# Patient Record
Sex: Female | Born: 1940
Health system: Southern US, Community
[De-identification: ages and names within clinical notes are randomized; demographics above are authoritative.]

## PROBLEM LIST (undated history)

## (undated) DIAGNOSIS — M8580 Other specified disorders of bone density and structure, unspecified site: Secondary | ICD-10-CM

## (undated) DIAGNOSIS — D126 Benign neoplasm of colon, unspecified: Secondary | ICD-10-CM

## (undated) DIAGNOSIS — I219 Acute myocardial infarction, unspecified: Secondary | ICD-10-CM

## (undated) DIAGNOSIS — Z8679 Personal history of other diseases of the circulatory system: Secondary | ICD-10-CM

## (undated) DIAGNOSIS — I10 Essential (primary) hypertension: Secondary | ICD-10-CM

## (undated) DIAGNOSIS — E785 Hyperlipidemia, unspecified: Secondary | ICD-10-CM

## (undated) DIAGNOSIS — Z9889 Other specified postprocedural states: Secondary | ICD-10-CM

## (undated) DIAGNOSIS — I480 Paroxysmal atrial fibrillation: Secondary | ICD-10-CM

## (undated) DIAGNOSIS — K5900 Constipation, unspecified: Secondary | ICD-10-CM

## (undated) DIAGNOSIS — M1990 Unspecified osteoarthritis, unspecified site: Secondary | ICD-10-CM

## (undated) DIAGNOSIS — I509 Heart failure, unspecified: Secondary | ICD-10-CM

## (undated) DIAGNOSIS — L89159 Pressure ulcer of sacral region, unspecified stage: Secondary | ICD-10-CM

## (undated) DIAGNOSIS — I2510 Atherosclerotic heart disease of native coronary artery without angina pectoris: Secondary | ICD-10-CM

## (undated) DIAGNOSIS — Z951 Presence of aortocoronary bypass graft: Secondary | ICD-10-CM

## (undated) HISTORY — PX: CATARACT EXTRACTION, BILATERAL: SHX1313

## (undated) HISTORY — DX: Other specified disorders of bone density and structure, unspecified site: M85.80

## (undated) HISTORY — DX: Hyperlipidemia, unspecified: E78.5

## (undated) HISTORY — DX: Essential (primary) hypertension: I10

## (undated) HISTORY — PX: DILATION AND CURETTAGE OF UTERUS: SHX78

## (undated) HISTORY — DX: Benign neoplasm of colon, unspecified: D12.6

## (undated) HISTORY — DX: Acute myocardial infarction, unspecified: I21.9

## (undated) HISTORY — PX: BREAST CYST EXCISION: SHX579

## (undated) HISTORY — DX: Constipation, unspecified: K59.00

---
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
## 1988-06-25 HISTORY — PX: TOTAL ABDOMINAL HYSTERECTOMY W/ BILATERAL SALPINGOOPHORECTOMY: SHX83

## 2008-02-24 DIAGNOSIS — M8580 Other specified disorders of bone density and structure, unspecified site: Secondary | ICD-10-CM

## 2008-02-24 HISTORY — DX: Other specified disorders of bone density and structure, unspecified site: M85.80

## 2008-02-24 LAB — HM DEXA SCAN

## 2008-03-25 DIAGNOSIS — D126 Benign neoplasm of colon, unspecified: Secondary | ICD-10-CM

## 2008-03-25 HISTORY — DX: Benign neoplasm of colon, unspecified: D12.6

## 2008-03-29 LAB — HM COLONOSCOPY

## 2011-03-19 LAB — HM MAMMOGRAPHY

## 2011-09-24 ENCOUNTER — Encounter: Payer: Self-pay | Admitting: *Deleted

## 2011-09-26 ENCOUNTER — Ambulatory Visit (INDEPENDENT_AMBULATORY_CARE_PROVIDER_SITE_OTHER): Payer: Medicare Other | Admitting: Family Medicine

## 2011-09-26 ENCOUNTER — Encounter: Payer: Self-pay | Admitting: Family Medicine

## 2011-09-26 VITALS — BP 126/70 | HR 72 | Ht 65.0 in | Wt 139.0 lb

## 2011-09-26 DIAGNOSIS — Z0000 Encounter for general adult medical examination without abnormal findings: Secondary | ICD-10-CM

## 2011-09-26 DIAGNOSIS — I10 Essential (primary) hypertension: Secondary | ICD-10-CM

## 2011-09-26 DIAGNOSIS — E782 Mixed hyperlipidemia: Secondary | ICD-10-CM

## 2011-09-26 LAB — POCT URINALYSIS DIPSTICK
Bilirubin, UA: NEGATIVE
Blood, UA: NEGATIVE
Glucose, UA: NEGATIVE
Ketones, UA: NEGATIVE
Leukocytes, UA: NEGATIVE
Nitrite, UA: NEGATIVE
Protein, UA: NEGATIVE
Spec Grav, UA: 1.01
Urobilinogen, UA: NEGATIVE
pH, UA: 6

## 2011-09-26 NOTE — Progress Notes (Signed)
Claudia Howell is a 71 y.o. female who presents for a complete physical.  She has the following concerns:  Hypertension--she is monitored yearly by Dr. Garnette Scheuermann. He doesn't do bloodwork, but writes her prescriptions.  Blood pressures have been good.  Denies dizziness, headaches, chest pain or cough.  Hyperlipidemia:  Previously took red yeast rice, but then was changed to pravastatin.  She developed leg cramps with pravastatin, so stopped taking it.  Review of old records show some elevated TG, and last LDL, when off meds was 152.  Following low cholesterol, lowfat diet, although she admits that her weakness is cheese.  Hasn't been doing the fish oil, although still has them at home. Zetia was too expensive. Also recalls having not tolerated Simvastatin.    Immunization History  Administered Date(s) Administered  . Pneumococcal Polysaccharide 06/30/2009  . Td 06/25/2002  . Tdap 01/23/2009  . Zoster 01/24/2008   Last Pap smear: n/a Last mammogram: 02/2011 Last colonoscopy: 10/09 Last DEXA: 9/09 Dentist: twice yearly Ophtho: every 2 years Exercise: gym twice weekly, and home exercises daily for 10 minutes.  Past Medical History  Diagnosis Date  . Hypertension     Dr.Henry Katrinka Blazing  . Hyperlipidemia   . Osteopenia 9/09  . Adenomatous colon polyp 10/09    Past Surgical History  Procedure Date  . Total abdominal hysterectomy w/ bilateral salpingoophorectomy 1990    benign growth  . Breast surgery 1990    x 3 left breast for cyst    History   Social History  . Marital Status: Widowed    Spouse Name: N/A    Number of Children: 2  . Years of Education: N/A   Occupational History  . retired    Social History Main Topics  . Smoking status: Never Smoker   . Smokeless tobacco: Never Used  . Alcohol Use: Yes     1 glass twice a month.  . Drug Use: No  . Sexually Active: Not Currently   Other Topics Concern  . Not on file   Social History Narrative   Lives alone.  Retired  Public house manager.  1 son in [place], 1 son in [place].   Family History  Problem Relation Age of Onset  . Hypertension Mother   . Heart attack Mother   . Hypertension Brother   . Cancer Brother     prostate  . Diabetes Neg Hx    Current outpatient prescriptions:Acetylcarnitine HCl (ACETYL L-CARNITINE) 250 MG CAPS, Take 1 capsule by mouth daily., Disp: , Rfl: ;  amLODipine (NORVASC) 5 MG tablet, Take 5 mg by mouth daily., Disp: , Rfl: ;  atenolol (TENORMIN) 100 MG tablet, Take 100 mg by mouth daily., Disp: , Rfl: ;  benazepril (LOTENSIN) 40 MG tablet, Take 40 mg by mouth daily., Disp: , Rfl: ;  Black Cohosh 40 MG CAPS, Take 1 capsule by mouth daily., Disp: , Rfl:  CALCIUM-MAGNESIUM-VITAMIN D PO, Take 1 tablet by mouth daily., Disp: , Rfl: ;  cholecalciferol (VITAMIN D) 1000 UNITS tablet, Take 1,000 Units by mouth daily., Disp: , Rfl: ;  Coenzyme Q10 (CO Q 10) 100 MG CAPS, Take 1 capsule by mouth daily., Disp: , Rfl: ;  [employer] BETA CAROTENE PO, Take 1 tablet by mouth daily., Disp: , Rfl: ;  Glucosamine-Chondroitin-MSM 500-400-300 MG TABS, Take 2 tablets by mouth daily., Disp: , Rfl:  hydrochlorothiazide (HYDRODIURIL) 25 MG tablet, Take 25 mg by mouth daily., Disp: , Rfl: ;  Multiple Vitamins-Minerals (MULTIVITAMIN WITH MINERALS) tablet, Take 1  tablet by mouth daily., Disp: , Rfl: ;  multivitamin-lutein (OCUVITE-LUTEIN) CAPS, Take 1 capsule by mouth daily., Disp: , Rfl: ;  Nutritional Supplements (ESTROVEN MAXIMUM STRENGTH) TABS, Take 1 tablet by mouth daily., Disp: , Rfl:  Zinc 25 MG TABS, Take 1 tablet by mouth daily., Disp: , Rfl:   No Known Allergies  ROS:  The patient denies anorexia, fever, weight changes, headaches,  vision changes, decreased hearing, ear pain, sore throat, breast concerns, chest pain, palpitations, dizziness, syncope, dyspnea on exertion, cough, swelling, nausea, vomiting, diarrhea, constipation, abdominal pain, melena, hematochezia, indigestion/heartburn, hematuria, incontinence, dysuria, vaginal  bleeding, discharge, odor or itch, genital lesions, joint pains, numbness, tingling, weakness, tremor, suspicious skin lesions, depression, anxiety, abnormal bleeding/bruising, or enlarged lymph nodes.  PHYSICAL EXAM: BP 126/70  Pulse 72  Ht 5\' 5"  (1.651 m)  Wt 139 lb (63.05 kg)  BMI 23.13 kg/m2  General Appearance:    Alert, cooperative, no distress, appears stated age  Head:    Normocephalic, without obvious abnormality, atraumatic  Eyes:    PERRL, conjunctiva/corneas clear, EOM's intact, fundi    benign  Ears:    Normal TM's and external ear canals  Nose:   Nares normal, mucosa normal, no drainage or sinus   tenderness  Throat:   Lips, mucosa, and tongue normal; teeth and gums normal  Neck:   Supple, no lymphadenopathy;  thyroid:  no   enlargement/tenderness/nodules; no carotid   bruit or JVD  Back:    Spine nontender, no curvature, ROM normal, no CVA     tenderness  Lungs:     Clear to auscultation bilaterally without wheezes, rales or     ronchi; respirations unlabored  Chest Wall:    No tenderness or deformity   Heart:    Regular rate and rhythm, S1 and S2 normal, no murmur, rub   or gallop  Breast Exam:    No tenderness, masses, or nipple discharge or inversion.      No axillary lymphadenopathy  Abdomen:     Soft, non-tender, nondistended, normoactive bowel sounds,    no masses, no hepatosplenomegaly  Genitalia:    Normal external genitalia without lesions.  BUS and vagina normal; normal bimanual exam without masses.  Rectal:    Normal tone, no masses or tenderness; guaiac negative stool  Extremities:   No clubbing, cyanosis or edema  Pulses:   2+ and symmetric all extremities  Skin:   Skin color, texture, turgor normal, no rashes or lesions  Lymph nodes:   Cervical, supraclavicular, and axillary nodes normal  Neurologic:   CNII-XII intact, normal strength, sensation and gait; reflexes 2+ and symmetric throughout          Psych:   Normal mood, affect, hygiene and  grooming.    ASSESSMENT/PLAN: 1. Routine general medical examination at a health care facility  POCT Urinalysis Dipstick, Visual acuity screening, TSH  2. Essential hypertension, benign  Comprehensive metabolic panel  3. Mixed hyperlipidemia  Lipid panel, Comprehensive metabolic panel, TSH   Hyperlipidemia--if LDL only borderline high, can consider restarting red yeast rice and fish oil. If very high, then try lipitor.  HTN--well controlled, due for labs.  Send copies of labs to Dr. Katrinka Blazing.  Discussed monthly self breast exams and yearly mammograms after the age of 73; at least 30 minutes of aerobic activity at least 5 days/week; proper sunscreen use reviewed; healthy diet, including goals of calcium and vitamin D intake and alcohol recommendations (less than or equal to 1 drink/day) reviewed; regular  seatbelt use; changing batteries in smoke detectors.  Immunization recommendations discussed--UTD, continue yearly flu shots.  Colonoscopy recommendations reviewed--due again in 2014  DEXA Rx given to patient to schedule at [facility]

## 2011-09-26 NOTE — Patient Instructions (Addendum)
HEALTH MAINTENANCE RECOMMENDATIONS:  It is recommended that you get at least 30 minutes of aerobic exercise at least 5 days/week (for weight loss, you may need as much as 60-90 minutes). This can be any activity that gets your heart rate up. This can be divided in 10-15 minute intervals if needed, but try and build up your endurance at least once a week.  Weight bearing exercise is also recommended twice weekly.  Eat a healthy diet with lots of vegetables, fruits and fiber.  "Colorful" foods have a lot of vitamins (ie green vegetables, tomatoes, red peppers, etc).  Limit sweet tea, regular sodas and alcoholic beverages, all of which has a lot of calories and sugar.  Up to 1 alcoholic drink daily may be beneficial for women (unless trying to lose weight, watch sugars).  Drink a lot of water.  Calcium recommendations are 1200-1500 mg daily (1500 mg for postmenopausal women or women without ovaries), and vitamin D 1000 IU daily.  This should be obtained from diet and/or supplements (vitamins), and calcium should not be taken all at once, but in divided doses.  Monthly self breast exams and yearly mammograms for women over the age of 68 is recommended.  Sunscreen of at least SPF 30 should be used on all sun-exposed parts of the skin when outside between the hours of 10 am and 4 pm (not just when at beach or pool, but even with exercise, golf, tennis, and yard work!)  Use a sunscreen that says "broad spectrum" so it covers both UVA and UVB rays, and make sure to reapply every 1-2 hours.  Remember to change the batteries in your smoke detectors when changing your clock times in the spring and fall.  Use your seat belt every time you are in a car, and please drive safely and not be distracted with cell phones and texting while driving.  Cholesterol Control Diet Cholesterol levels in your body are determined significantly by your diet. Cholesterol levels may also be related to heart disease. The following  material helps to explain this relationship and discusses what you can do to help keep your heart healthy. Not all cholesterol is bad. Low-density lipoprotein (LDL) cholesterol is the "bad" cholesterol. It may cause fatty deposits to build up inside your arteries. High-density lipoprotein (HDL) cholesterol is "good." It helps to remove the "bad" LDL cholesterol from your blood. Cholesterol is a very important risk factor for heart disease. Other risk factors are high blood pressure, smoking, stress, heredity, and weight. The heart muscle gets its supply of blood through the coronary arteries. If your LDL cholesterol is high and your HDL cholesterol is low, you are at risk for having fatty deposits build up in your coronary arteries. This leaves less room through which blood can flow. Without sufficient blood and oxygen, the heart muscle cannot function properly and you may feel chest pains (angina pectoris). When a coronary artery closes up entirely, a part of the heart muscle may die, causing a heart attack (myocardial infarction). CHECKING CHOLESTEROL When your caregiver sends your blood to a lab to be analyzed for cholesterol, a complete lipid (fat) profile may be done. With this test, the total amount of cholesterol and levels of LDL and HDL are determined. Triglycerides are a type of fat that circulates in the blood and can also be used to determine heart disease risk. The list below describes what the numbers should be: Test: Total Cholesterol.  Less than 200 mg/dl.  Test: LDL "bad cholesterol."  Less than 100 mg/dl.   Less than 70 mg/dl if you are at very high risk of a heart attack or sudden cardiac death.  Test: HDL "good cholesterol."  Greater than 50 mg/dl for women.   Greater than 40 mg/dl for men.  Test: Triglycerides.  Less than 150 mg/dl.  CONTROLLING CHOLESTEROL WITH DIET Although exercise and lifestyle factors are important, your diet is key. That is because certain foods are  known to raise cholesterol and others to lower it. The goal is to balance foods for their effect on cholesterol and more importantly, to replace saturated and trans fat with other types of fat, such as monounsaturated fat, polyunsaturated fat, and omega-3 fatty acids. On average, a person should consume no more than 15 to 17 g of saturated fat daily. Saturated and trans fats are considered "bad" fats, and they will raise LDL cholesterol. Saturated fats are primarily found in animal products such as meats, butter, and cream. However, that does not mean you need to sacrifice all your favorite foods. Today, there are good tasting, low-fat, low-cholesterol substitutes for most of the things you like to eat. Choose low-fat or nonfat alternatives. Choose round or loin cuts of red meat, since these types of cuts are lowest in fat and cholesterol. Chicken (without the skin), fish, veal, and ground [place] breast are excellent choices. Eliminate fatty meats, such as hot dogs and salami. Even shellfish have little or no saturated fat. Have a 3 oz (85 g) portion when you eat lean meat, poultry, or fish. Trans fats are also called "partially hydrogenated oils." They are oils that have been scientifically manipulated so that they are solid at room temperature resulting in a longer shelf life and improved taste and texture of foods in which they are added. Trans fats are found in stick margarine, some tub margarines, cookies, crackers, and baked goods.  When baking and cooking, oils are an excellent substitute for butter. The monounsaturated oils are especially beneficial since it is believed they lower LDL and raise HDL. The oils you should avoid entirely are saturated tropical oils, such as coconut and palm.  Remember to eat liberally from food groups that are naturally free of saturated and trans fat, including fish, fruit, vegetables, beans, grains (barley, rice, couscous, bulgur wheat), and pasta (without cream sauces).    IDENTIFYING FOODS THAT LOWER CHOLESTEROL  Soluble fiber may lower your cholesterol. This type of fiber is found in fruits such as apples, vegetables such as broccoli, potatoes, and carrots, legumes such as beans, peas, and lentils, and grains such as barley. Foods fortified with plant sterols (phytosterol) may also lower cholesterol. You should eat at least 2 g per day of these foods for a cholesterol lowering effect.  Read package labels to identify low-saturated fats, trans fats free, and low-fat foods at the supermarket. Select cheeses that have only 2 to 3 g saturated fat per ounce. Use a heart-healthy tub margarine that is free of trans fats or partially hydrogenated oil. When buying baked goods (cookies, crackers), avoid partially hydrogenated oils. Breads and muffins should be made from whole grains (whole-wheat or whole oat flour, instead of "flour" or "enriched flour"). Buy non-creamy canned soups with reduced salt and no added fats.  FOOD PREPARATION TECHNIQUES  Never deep-fry. If you must fry, either stir-fry, which uses very little fat, or use non-stick cooking sprays. When possible, broil, bake, or roast meats, and steam vegetables. Instead of dressing vegetables with butter or margarine, use lemon and herbs,  applesauce and cinnamon (for squash and sweet potatoes), nonfat yogurt, salsa, and low-fat dressings for salads.  LOW-SATURATED FAT / LOW-FAT FOOD SUBSTITUTES Meats / Saturated Fat (g)  Avoid: Steak, marbled (3 oz/85 g) / 11 g   Choose: Steak, lean (3 oz/85 g) / 4 g   Avoid: Hamburger (3 oz/85 g) / 7 g   Choose: Hamburger, lean (3 oz/85 g) / 5 g   Avoid: Ham (3 oz/85 g) / 6 g   Choose: Ham, lean cut (3 oz/85 g) / 2.4 g   Avoid: Chicken, with skin, dark meat (3 oz/85 g) / 4 g   Choose: Chicken, skin removed, dark meat (3 oz/85 g) / 2 g   Avoid: Chicken, with skin, light meat (3 oz/85 g) / 2.5 g   Choose: Chicken, skin removed, light meat (3 oz/85 g) / 1 g  Dairy /  Saturated Fat (g)  Avoid: Whole milk (1 cup) / 5 g   Choose: Low-fat milk, 2% (1 cup) / 3 g   Choose: Low-fat milk, 1% (1 cup) / 1.5 g   Choose: Skim milk (1 cup) / 0.3 g   Avoid: Hard cheese (1 oz/28 g) / 6 g   Choose: Skim milk cheese (1 oz/28 g) / 2 to 3 g   Avoid: Cottage cheese, 4% fat (1 cup) / 6.5 g   Choose: Low-fat cottage cheese, 1% fat (1 cup) / 1.5 g   Avoid: Ice cream (1 cup) / 9 g   Choose: Sherbet (1 cup) / 2.5 g   Choose: Nonfat frozen yogurt (1 cup) / 0.3 g   Choose: Frozen fruit bar / trace   Avoid: Whipped cream (1 tbs) / 3.5 g   Choose: Nondairy whipped topping (1 tbs) / 1 g  Condiments / Saturated Fat (g)  Avoid: Mayonnaise (1 tbs) / 2 g   Choose: Low-fat mayonnaise (1 tbs) / 1 g   Avoid: Butter (1 tbs) / 7 g   Choose: Extra light margarine (1 tbs) / 1 g   Avoid: Coconut oil (1 tbs) / 11.8 g   Choose: Olive oil (1 tbs) / 1.8 g   Choose: Corn oil (1 tbs) / 1.7 g   Choose: Safflower oil (1 tbs) / 1.2 g   Choose: Sunflower oil (1 tbs) / 1.4 g   Choose: Soybean oil (1 tbs) / 2.4 g   Choose: Canola oil (1 tbs) / 1 g  Document Released: 06/11/2005 Document Revised: 05/31/2011 Document Reviewed: 11/30/2010 [facility] Patient Information 2012 [place], [place].

## 2011-09-27 ENCOUNTER — Encounter: Payer: Self-pay | Admitting: Family Medicine

## 2011-09-27 ENCOUNTER — Other Ambulatory Visit: Payer: Self-pay | Admitting: *Deleted

## 2011-09-27 DIAGNOSIS — E782 Mixed hyperlipidemia: Secondary | ICD-10-CM

## 2011-09-27 DIAGNOSIS — Z79899 Other long term (current) drug therapy: Secondary | ICD-10-CM

## 2011-09-27 LAB — COMPREHENSIVE METABOLIC PANEL
ALT: 14 U/L (ref 0–35)
AST: 20 U/L (ref 0–37)
Albumin: 4.8 g/dL (ref 3.5–5.2)
Alkaline Phosphatase: 49 U/L (ref 39–117)
BUN: 16 mg/dL (ref 6–23)
CO2: 26 mEq/L (ref 19–32)
Calcium: 9.7 mg/dL (ref 8.4–10.5)
Chloride: 98 mEq/L (ref 96–112)
Creat: 0.82 mg/dL (ref 0.50–1.10)
Glucose, Bld: 79 mg/dL (ref 70–99)
Potassium: 3.6 mEq/L (ref 3.5–5.3)
Sodium: 138 mEq/L (ref 135–145)
Total Bilirubin: 0.4 mg/dL (ref 0.3–1.2)
Total Protein: 7.6 g/dL (ref 6.0–8.3)

## 2011-09-27 LAB — LIPID PANEL
Cholesterol: 233 mg/dL — ABNORMAL HIGH (ref 0–200)
HDL: 53 mg/dL (ref >39)
LDL Cholesterol: 148 mg/dL — ABNORMAL HIGH (ref 0–99)
Total CHOL/HDL Ratio: 4.4 Ratio
Triglycerides: 158 mg/dL — ABNORMAL HIGH (ref <150)
VLDL: 32 mg/dL (ref 0–40)

## 2011-09-27 LAB — TSH: TSH: 1.025 u[IU]/mL (ref 0.350–4.500)

## 2011-10-02 ENCOUNTER — Other Ambulatory Visit: Payer: Self-pay | Admitting: Internal Medicine

## 2011-10-02 DIAGNOSIS — E7800 Pure hypercholesterolemia, unspecified: Secondary | ICD-10-CM

## 2011-10-02 NOTE — Telephone Encounter (Signed)
There are two [facility] on [address], and at [address].  Please check which she prefers, enter pharmacy and send the rx.  Thanks.  Order has been pended.

## 2011-10-08 MED ORDER — ATORVASTATIN CALCIUM 10 MG PO TABS: 10.0000 mg | ORAL_TABLET | Freq: Every day | ORAL | Status: DC

## 2011-10-08 NOTE — Telephone Encounter (Signed)
Called pt to verify pharmacy. [facility] would like [facility] [address]

## 2011-10-09 DIAGNOSIS — E28319 Asymptomatic premature menopause: Secondary | ICD-10-CM | POA: Diagnosis not present

## 2011-10-09 DIAGNOSIS — M899 Disorder of bone, unspecified: Secondary | ICD-10-CM | POA: Diagnosis not present

## 2011-10-09 DIAGNOSIS — M949 Disorder of cartilage, unspecified: Secondary | ICD-10-CM | POA: Diagnosis not present

## 2011-10-09 LAB — HM MAMMOGRAPHY: HM Mammogram: NORMAL

## 2011-10-09 LAB — HM DEXA SCAN

## 2011-10-10 DIAGNOSIS — I10 Essential (primary) hypertension: Secondary | ICD-10-CM | POA: Diagnosis not present

## 2011-10-10 DIAGNOSIS — E785 Hyperlipidemia, unspecified: Secondary | ICD-10-CM | POA: Diagnosis not present

## 2011-10-10 DIAGNOSIS — R0989 Other specified symptoms and signs involving the circulatory and respiratory systems: Secondary | ICD-10-CM | POA: Diagnosis not present

## 2011-10-15 ENCOUNTER — Encounter: Payer: Self-pay | Admitting: Family Medicine

## 2011-11-02 DIAGNOSIS — R0989 Other specified symptoms and signs involving the circulatory and respiratory systems: Secondary | ICD-10-CM | POA: Diagnosis not present

## 2011-11-06 ENCOUNTER — Telehealth: Payer: Self-pay | Admitting: Internal Medicine

## 2011-11-06 DIAGNOSIS — E7800 Pure hypercholesterolemia, unspecified: Secondary | ICD-10-CM

## 2011-11-06 MED ORDER — ATORVASTATIN CALCIUM 10 MG PO TABS: 10.0000 mg | ORAL_TABLET | Freq: Every day | ORAL | Status: DC

## 2011-11-06 NOTE — Telephone Encounter (Signed)
Done lipitor for a 90 day supply

## 2011-12-13 DIAGNOSIS — H35369 Drusen (degenerative) of macula, unspecified eye: Secondary | ICD-10-CM | POA: Diagnosis not present

## 2011-12-13 DIAGNOSIS — H2510 Age-related nuclear cataract, unspecified eye: Secondary | ICD-10-CM | POA: Diagnosis not present

## 2011-12-13 DIAGNOSIS — H52209 Unspecified astigmatism, unspecified eye: Secondary | ICD-10-CM | POA: Diagnosis not present

## 2011-12-13 DIAGNOSIS — H5200 Hypermetropia, unspecified eye: Secondary | ICD-10-CM | POA: Diagnosis not present

## 2012-03-24 DIAGNOSIS — L819 Disorder of pigmentation, unspecified: Secondary | ICD-10-CM | POA: Diagnosis not present

## 2012-03-24 DIAGNOSIS — L821 Other seborrheic keratosis: Secondary | ICD-10-CM | POA: Diagnosis not present

## 2012-03-24 DIAGNOSIS — D485 Neoplasm of uncertain behavior of skin: Secondary | ICD-10-CM | POA: Diagnosis not present

## 2012-04-02 ENCOUNTER — Encounter: Payer: Self-pay | Admitting: Family Medicine

## 2012-04-16 ENCOUNTER — Telehealth: Payer: Self-pay | Admitting: Internal Medicine

## 2012-04-16 DIAGNOSIS — E7800 Pure hypercholesterolemia, unspecified: Secondary | ICD-10-CM

## 2012-04-16 MED ORDER — ATORVASTATIN CALCIUM 10 MG PO TABS: 10.0000 mg | ORAL_TABLET | Freq: Every day | ORAL | Status: DC

## 2012-04-16 NOTE — Telephone Encounter (Signed)
done

## 2012-05-07 ENCOUNTER — Other Ambulatory Visit: Payer: Medicare Other

## 2012-05-07 DIAGNOSIS — Z23 Encounter for immunization: Secondary | ICD-10-CM

## 2012-05-07 MED ORDER — INFLUENZA VIRUS VACC SPLIT PF IM SUSP: 0.5000 mL | Freq: Once | INTRAMUSCULAR | Status: DC

## 2012-06-26 ENCOUNTER — Telehealth: Payer: Self-pay | Admitting: Internal Medicine

## 2012-06-26 DIAGNOSIS — E7800 Pure hypercholesterolemia, unspecified: Secondary | ICD-10-CM

## 2012-06-26 MED ORDER — ATORVASTATIN CALCIUM 10 MG PO TABS: 10.0000 mg | ORAL_TABLET | Freq: Every day | ORAL | Status: DC

## 2012-06-26 NOTE — Telephone Encounter (Signed)
done

## 2012-07-14 ENCOUNTER — Telehealth: Payer: Self-pay | Admitting: Family Medicine

## 2012-07-14 NOTE — Telephone Encounter (Signed)
This patient was started on Lipitor in April 2013, and future labs were ordered for lipids and LFT's.  I originally authorized 3 months of meds, but she apparently has been getting refills (once in May and once in October) and NEVER returned for her labs (that should have been done in July).  She needs to return ASAP for these labs (other labs will be done as part of her physical at April, will order at her visit).  Authorize #[id number] only if going to run out before she can get labs done

## 2012-07-14 NOTE — Telephone Encounter (Signed)
Pt scheduled a cpe for April. She needs refill on lipitor sent to new pharmacy. New pharmacy is [facility].

## 2012-07-15 ENCOUNTER — Telehealth: Payer: Self-pay | Admitting: *Deleted

## 2012-07-15 NOTE — Telephone Encounter (Signed)
Spoke with patient and she will come in this Thursday for the lipid/liver labs, orders are already in system. She has 4 pills left so I will wait until her labs come back and order her lipitor at that time. Thanks.

## 2012-07-17 ENCOUNTER — Other Ambulatory Visit: Payer: Medicare Other

## 2012-07-17 DIAGNOSIS — Z79899 Other long term (current) drug therapy: Secondary | ICD-10-CM

## 2012-07-17 DIAGNOSIS — E782 Mixed hyperlipidemia: Secondary | ICD-10-CM

## 2012-07-17 LAB — LIPID PANEL
Cholesterol: 159 mg/dL (ref 0–200)
HDL: 48 mg/dL (ref >39)
LDL Cholesterol: 85 mg/dL (ref 0–99)
Total CHOL/HDL Ratio: 3.3 Ratio
Triglycerides: 129 mg/dL (ref <150)
VLDL: 26 mg/dL (ref 0–40)

## 2012-07-17 LAB — HEPATIC FUNCTION PANEL
ALT: 13 U/L (ref 0–35)
AST: 17 U/L (ref 0–37)
Albumin: 4.2 g/dL (ref 3.5–5.2)
Alkaline Phosphatase: 38 U/L — ABNORMAL LOW (ref 39–117)
Bilirubin, Direct: 0.1 mg/dL (ref 0.0–0.3)
Indirect Bilirubin: 0.4 mg/dL (ref 0.0–0.9)
Total Bilirubin: 0.5 mg/dL (ref 0.3–1.2)
Total Protein: 6.7 g/dL (ref 6.0–8.3)

## 2012-07-18 ENCOUNTER — Other Ambulatory Visit: Payer: Self-pay | Admitting: Family Medicine

## 2012-07-18 DIAGNOSIS — E7800 Pure hypercholesterolemia, unspecified: Secondary | ICD-10-CM

## 2012-07-18 MED ORDER — ATORVASTATIN CALCIUM 10 MG PO TABS: 10.0000 mg | ORAL_TABLET | Freq: Every day | ORAL | Status: DC

## 2012-09-30 ENCOUNTER — Encounter: Payer: Self-pay | Admitting: Internal Medicine

## 2012-10-06 ENCOUNTER — Encounter: Payer: Self-pay | Admitting: Family Medicine

## 2012-10-06 ENCOUNTER — Ambulatory Visit (INDEPENDENT_AMBULATORY_CARE_PROVIDER_SITE_OTHER): Payer: Medicare Other | Admitting: Family Medicine

## 2012-10-06 VITALS — BP 132/78 | HR 68 | Ht 65.25 in | Wt 144.0 lb

## 2012-10-06 DIAGNOSIS — R5381 Other malaise: Secondary | ICD-10-CM | POA: Diagnosis not present

## 2012-10-06 DIAGNOSIS — R5383 Other fatigue: Secondary | ICD-10-CM

## 2012-10-06 DIAGNOSIS — R0600 Dyspnea, unspecified: Secondary | ICD-10-CM

## 2012-10-06 DIAGNOSIS — E7800 Pure hypercholesterolemia, unspecified: Secondary | ICD-10-CM | POA: Diagnosis not present

## 2012-10-06 DIAGNOSIS — M899 Disorder of bone, unspecified: Secondary | ICD-10-CM | POA: Diagnosis not present

## 2012-10-06 DIAGNOSIS — Z0000 Encounter for general adult medical examination without abnormal findings: Secondary | ICD-10-CM

## 2012-10-06 DIAGNOSIS — E559 Vitamin D deficiency, unspecified: Secondary | ICD-10-CM | POA: Diagnosis not present

## 2012-10-06 DIAGNOSIS — R0989 Other specified symptoms and signs involving the circulatory and respiratory systems: Secondary | ICD-10-CM

## 2012-10-06 DIAGNOSIS — R079 Chest pain, unspecified: Secondary | ICD-10-CM

## 2012-10-06 DIAGNOSIS — R0609 Other forms of dyspnea: Secondary | ICD-10-CM | POA: Diagnosis not present

## 2012-10-06 DIAGNOSIS — Z01419 Encounter for gynecological examination (general) (routine) without abnormal findings: Secondary | ICD-10-CM

## 2012-10-06 DIAGNOSIS — I10 Essential (primary) hypertension: Secondary | ICD-10-CM | POA: Insufficient documentation

## 2012-10-06 DIAGNOSIS — M8580 Other specified disorders of bone density and structure, unspecified site: Secondary | ICD-10-CM

## 2012-10-06 DIAGNOSIS — E782 Mixed hyperlipidemia: Secondary | ICD-10-CM | POA: Diagnosis not present

## 2012-10-06 LAB — CBC WITH DIFFERENTIAL/PLATELET
Basophils Absolute: 0 10*3/uL (ref 0.0–0.1)
Basophils Relative: 1 % (ref 0–1)
Eosinophils Absolute: 0.2 10*3/uL (ref 0.0–0.7)
Eosinophils Relative: 3 % (ref 0–5)
HCT: 40 % (ref 36.0–46.0)
Hemoglobin: 13.2 g/dL (ref 12.0–15.0)
Lymphocytes Relative: 28 % (ref 12–46)
Lymphs Abs: 1.8 10*3/uL (ref 0.7–4.0)
MCH: 30.3 pg (ref 26.0–34.0)
MCHC: 33 g/dL (ref 30.0–36.0)
MCV: 91.7 fL (ref 78.0–100.0)
Monocytes Absolute: 0.5 10*3/uL (ref 0.1–1.0)
Monocytes Relative: 8 % (ref 3–12)
Neutro Abs: 3.9 10*3/uL (ref 1.7–7.7)
Neutrophils Relative %: 60 % (ref 43–77)
Platelets: 212 10*3/uL (ref 150–400)
RBC: 4.36 MIL/uL (ref 3.87–5.11)
RDW: 12.7 % (ref 11.5–15.5)
WBC: 6.5 10*3/uL (ref 4.0–10.5)

## 2012-10-06 NOTE — Patient Instructions (Signed)
HEALTH MAINTENANCE RECOMMENDATIONS:  It is recommended that you get at least 30 minutes of aerobic exercise at least 5 days/week (for weight loss, you may need as much as 60-90 minutes). This can be any activity that gets your heart rate up. This can be divided in 10-15 minute intervals if needed, but try and build up your endurance at least once a week.  Weight bearing exercise is also recommended twice weekly.  Eat a healthy diet with lots of vegetables, fruits and fiber.  "Colorful" foods have a lot of vitamins (ie green vegetables, tomatoes, red peppers, etc).  Limit sweet tea, regular sodas and alcoholic beverages, all of which has a lot of calories and sugar.  Up to 1 alcoholic drink daily may be beneficial for women (unless trying to lose weight, watch sugars).  Drink a lot of water.  Calcium recommendations are 1200-1500 mg daily (1500 mg for postmenopausal women or women without ovaries), and vitamin D 1000 IU daily.  This should be obtained from diet and/or supplements (vitamins), and calcium should not be taken all at once, but in divided doses.  Monthly self breast exams and yearly mammograms for women over the age of 29 is recommended.  Sunscreen of at least SPF 30 should be used on all sun-exposed parts of the skin when outside between the hours of 10 am and 4 pm (not just when at beach or pool, but even with exercise, golf, tennis, and yard work!)  Use a sunscreen that says "broad spectrum" so it covers both UVA and UVB rays, and make sure to reapply every 1-2 hours.  Remember to change the batteries in your smoke detectors when changing your clock times in the spring and fall.  Use your seat belt every time you are in a car, and please drive safely and not be distracted with cell phones and texting while driving.   Go for chest x-ray today. We will send copies of labs to Dr. Katrinka Blazing. Send back your hemoccult cards (stool samples)

## 2012-10-06 NOTE — Progress Notes (Signed)
Chief Complaint  Patient presents with  . Med check plus    fasting med check plus. Has been experiencing chest pain with prolonged exercise since last year but has worsened this year.   Claudia Howell is a 72 y.o. female who presents for a med check, and annual wellness visit.  She has the following concerns: Increasing chest pain with exercise over the last year.  No pain at rest.  Symptoms have been gradually increasing over the last year.  Has been exercising less to avoid developing pain.  Has appointment in 2 days with Dr. Katrinka Blazing, her cardiologist, who manages her hypertension. Denies pain at rest, or shortness of breath. Currently without pain.  Hypertension--she is monitored yearly by Dr. Garnette Scheuermann. He doesn't do bloodwork, but writes her prescriptions. Blood pressures have been good. Denies dizziness, headaches, chest pain or cough.  BP's at home are running 130/60's.  Hyperlipidemia follow-up:  Patient is reportedly following a low-fat, low cholesterol diet.  Compliant with medications and denies medication side effects  AWV: Cardiology: Dr. Katrinka Blazing Ophtho: Dr. Sharlot Gowda Dentist: Dr. Briscoe Deutscher GI:  [facility]  See questionnaires for depression and ADL screening--notable for fatigue, which has been worsening. In past some fatigue was felt to be due to her BP meds.  End of Life Issues:  Has Living Will and healthcare power of attorney.  Also has recently made a Will.  Immunization History  Administered Date(s) Administered  . Influenza Split 05/07/2012  . Pneumococcal Polysaccharide 06/30/2009  . Td 06/25/2002  . Tdap 01/23/2009  . Zoster 01/24/2008   Last Pap smear: n/a (hysterectomy for benign reasons) Last mammogram: 09/2011 Last colonoscopy: 10/09 polyp Last DEXA: 09/2011 (very mild osteopenia) Exercise:  Very limited over last 6 months, due to worsening dyspnea Dentist: twice yearly Ophtho: every 2 years  Past Medical History  Diagnosis Date  . Hypertension     Dr.Henry Katrinka Blazing   . Hyperlipidemia   . Osteopenia 9/09  . Adenomatous colon polyp 10/09    Past Surgical History  Procedure Laterality Date  . Total abdominal hysterectomy w/ bilateral salpingoophorectomy  1990    benign growth  . Breast surgery  1990    x 3 left breast for cyst    History   Social History  . Marital Status: Widowed    Spouse Name: N/A    Number of Children: 2  . Years of Education: N/A   Occupational History  . retired    Social History Main Topics  . Smoking status: Never Smoker   . Smokeless tobacco: Never Used  . Alcohol Use: Yes     Comment: 1 glass of wine weekly (when playing cards)  . Drug Use: No  . Sexually Active: Not Currently   Other Topics Concern  . Not on file   Social History Narrative   Lives alone.  Retired Public house manager.  1 son in [place], 1 son in [place].    Family History  Problem Relation Age of Onset  . Hypertension Mother   . Heart attack Mother   . Heart disease Mother   . Hypertension Brother   . Cancer Brother     prostate  . Diabetes Neg Hx   . Breast cancer Neg Hx   . Colon cancer Neg Hx     Current outpatient prescriptions:Acetylcarnitine HCl (ACETYL L-CARNITINE) 250 MG CAPS, Take 1 capsule by mouth daily., Disp: , Rfl: ;  amLODipine (NORVASC) 5 MG tablet, Take 5 mg by mouth daily., Disp: , Rfl: ;  atenolol (TENORMIN) 100 MG tablet, Take 100 mg by mouth daily., Disp: , Rfl: ;  atorvastatin (LIPITOR) 10 MG tablet, Take 1 tablet (10 mg total) by mouth daily., Disp: 90 tablet, Rfl: 0 benazepril (LOTENSIN) 40 MG tablet, Take 40 mg by mouth daily., Disp: , Rfl: ;  Black Cohosh 40 MG CAPS, Take 1 capsule by mouth daily., Disp: , Rfl: ;  cholecalciferol (VITAMIN D) 1000 UNITS tablet, Take 1,000 Units by mouth daily., Disp: , Rfl: ;  Coenzyme Q10 (CO Q 10) 100 MG CAPS, Take 1 capsule by mouth daily., Disp: , Rfl: ;  [employer] BETA CAROTENE PO, Take 1 tablet by mouth daily., Disp: , Rfl:  Glucosamine-Chondroitin-MSM 500-400-300 MG TABS, Take 2 tablets by mouth  daily., Disp: , Rfl: ;  hydrochlorothiazide (HYDRODIURIL) 25 MG tablet, Take 25 mg by mouth daily., Disp: , Rfl: ;  Multiple Vitamins-Minerals (MULTIVITAMIN WITH MINERALS) tablet, Take 1 tablet by mouth daily., Disp: , Rfl: ;  multivitamin-lutein (OCUVITE-LUTEIN) CAPS, Take 1 capsule by mouth daily., Disp: , Rfl:  Nutritional Supplements (ESTROVEN MAXIMUM STRENGTH) TABS, Take 1 tablet by mouth daily., Disp: , Rfl: ;  Zinc 25 MG TABS, Take 1 tablet by mouth daily., Disp: , Rfl:   No Known Allergies  ROS: The patient denies anorexia, fever, headaches, vision changes, decreased hearing, ear pain, sore throat, breast concerns, palpitations, dizziness, syncope, dyspnea on exertion, cough, swelling, nausea, vomiting, diarrhea, constipation, abdominal pain, melena, hematochezia, indigestion/heartburn, hematuria, incontinence, dysuria, vaginal bleeding, discharge, odor or itch, genital lesions, joint pains, weakness, tremor, suspicious skin lesions, depression, anxiety, abnormal bleeding/bruising, or enlarged lymph nodes.  Left arm will sometimes fall asleep at night, better when she gets up and moves around (takes a little while to resolve, doesn't occur frequently, about 2x/month, less frequently lately). +5 pound weight gain, fatigue. +chest pain with exertion, as per HPI.  PHYSICAL EXAM: BP 132/78  Pulse 68  Ht 5' 5.25" (1.657 m)  Wt 144 lb (65.318 kg)  BMI 23.79 kg/m2 160/74 on repeat by MD (appears somewhat anxious in discussing her BP and chest pain)  General Appearance:  Alert, cooperative, no distress, appears stated age   Head:  Normocephalic, without obvious abnormality, atraumatic   Eyes:  PERRL, conjunctiva/corneas clear, EOM's intact, fundi  benign   Ears:  Normal TM's and external ear canals   Nose:  Nares normal, mucosa normal, no drainage or sinus tenderness   Throat:  Lips, mucosa, and tongue normal; teeth and gums normal   Neck:  Supple, no lymphadenopathy; thyroid: no  enlargement/tenderness/nodules; no carotid  bruit or JVD   Back:  Spine nontender, no curvature, ROM normal, no CVA tenderness   Lungs:  Clear to auscultation bilaterally without wheezes, rales or ronchi; respirations unlabored   Chest Wall:  No tenderness or deformity   Heart:  Regular rate and rhythm, S1 and S2 normal, no murmur, rub  or gallop   Breast Exam:  No tenderness, masses, or nipple discharge or inversion. No axillary lymphadenopathy.  WHSS left breast  Abdomen:  Soft, non-tender, nondistended, normoactive bowel sounds,  no masses, no hepatosplenomegaly   Genitalia:  Normal external genitalia without lesions. BUS and vagina normal; normal bimanual exam without masses.   Rectal:  Normal tone, no masses or tenderness; no stool in vault   Extremities:  No clubbing, cyanosis or edema   Pulses:  2+ and symmetric all extremities   Skin:  Skin color, texture, turgor normal, no rashes or lesions   Lymph nodes:  Cervical,  supraclavicular, and axillary nodes normal   Neurologic:  CNII-XII intact, normal strength, sensation and gait; reflexes 2+ and symmetric throughout           Psych: Normal mood, affect, hygiene and grooming.   ASSESSMENT/PLAN:  Chest pain - exertional, gradually worsening.  Concerning for ischemia.  Pt declines EKG as she has appt with cardiologist in 2 days.  check labs and CXR, forward results. - Plan: DG Chest 2 View  Pure hypercholesterolemia - well controlled per labs 3 months ago.  Send copies to Dr. Katrinka Blazing - Plan: Comprehensive metabolic panel  Essential hypertension, benign - controlled per home numbers.  Has appt with Dr. Katrinka Blazing in 2 days--check labs and send copies - Plan: Comprehensive metabolic panel  Unspecified vitamin D deficiency - Plan: Vitamin D 25 hydroxy  Other malaise and fatigue - Plan: TSH, CBC with Differential, Comprehensive metabolic panel  DOE (dyspnea on exertion) - Plan: CBC with Differential, Comprehensive metabolic panel  Osteopenia  - Plan: Vitamin D 25 hydroxy  Send copies of current labs and lipids from January to Dr. Katrinka Blazing.  Discussed monthly self breast exams and yearly mammograms after the age of 59; at least 30 minutes of aerobic activity at least 5 days/week; proper sunscreen use reviewed; healthy diet, including goals of calcium and vitamin D intake and alcohol recommendations (less than or equal to 1 drink/day) reviewed; regular seatbelt use; changing batteries in smoke detectors.  Immunization recommendations discussed, UTD.  Colonoscopy recommendations reviewed, due again in October.  Given hemoccult kit (no stool in vault to hemetest today)  AWV--filled out MOST form (see copy in chart); has Living Will and POA. See copy of handout given to pt reviewing Medicare covered services, UTD

## 2012-10-07 ENCOUNTER — Ambulatory Visit
Admission: RE | Admit: 2012-10-07 | Discharge: 2012-10-07 | Disposition: A | Discharge status: Home / Self Care | Payer: Medicare Other | Source: Ambulatory Visit | Attending: Family Medicine | Admitting: Family Medicine

## 2012-10-07 DIAGNOSIS — R079 Chest pain, unspecified: Secondary | ICD-10-CM

## 2012-10-07 DIAGNOSIS — J9819 Other pulmonary collapse: Secondary | ICD-10-CM | POA: Diagnosis not present

## 2012-10-07 LAB — COMPREHENSIVE METABOLIC PANEL
ALT: 14 U/L (ref 0–35)
AST: 19 U/L (ref 0–37)
Albumin: 4.5 g/dL (ref 3.5–5.2)
Alkaline Phosphatase: 40 U/L (ref 39–117)
BUN: 14 mg/dL (ref 6–23)
CO2: 26 mEq/L (ref 19–32)
Calcium: 9.4 mg/dL (ref 8.4–10.5)
Chloride: 103 mEq/L (ref 96–112)
Creat: 0.7 mg/dL (ref 0.50–1.10)
Glucose, Bld: 90 mg/dL (ref 70–99)
Potassium: 3.8 mEq/L (ref 3.5–5.3)
Sodium: 138 mEq/L (ref 135–145)
Total Bilirubin: 0.4 mg/dL (ref 0.3–1.2)
Total Protein: 7.3 g/dL (ref 6.0–8.3)

## 2012-10-07 LAB — TSH: TSH: 1.061 u[IU]/mL (ref 0.350–4.500)

## 2012-10-07 LAB — VITAMIN D 25 HYDROXY (VIT D DEFICIENCY, FRACTURES): Vit D, 25-Hydroxy: 35 ng/mL (ref 30–89)

## 2012-10-07 NOTE — Progress Notes (Signed)
Quick Note:  CALLED PT INFORMED HER WORD FOR WORD Please advise pt that her labs are all completely normal (blood count, chemistry, thyroid, vitamin D). Please send these results, as well as her lipids from January to Dr. Garnette Scheuermann at [facility] cardiology. She has an appt there tomorrow (Wed). Also advise pt that there were no acute findings on CXR, just some scarring. If Dr. Katrinka Blazing doesn't find cardiac etiology for her exertional chest pain, then we might need to look into this more (find old films for comparison, consider PFT's, CT vs sending to pulmonary). Since she is having pain, not shortness of breath, will start with cardiology eval. ALSO FAXED LABS TO DR.SMITH ______

## 2012-10-07 NOTE — Progress Notes (Signed)
Quick Note:  Please advise pt that her labs are all completely normal (blood count, chemistry, thyroid, vitamin D). Please send these results, as well as her lipids from January to Dr. Garnette Scheuermann at [facility] cardiology. She has an appt there tomorrow (Wed). Also advise pt that there were no acute findings on CXR, just some scarring. If Dr. Katrinka Blazing doesn't find cardiac etiology for her exertional chest pain, then we might need to look into this more (find old films for comparison, consider PFT's, CT vs sending to pulmonary). Since she is having pain, not shortness of breath, will start with cardiology eval. ______

## 2012-10-08 ENCOUNTER — Telehealth: Payer: Self-pay | Admitting: Family Medicine

## 2012-10-08 DIAGNOSIS — E782 Mixed hyperlipidemia: Secondary | ICD-10-CM | POA: Diagnosis not present

## 2012-10-08 DIAGNOSIS — I209 Angina pectoris, unspecified: Secondary | ICD-10-CM | POA: Diagnosis not present

## 2012-10-08 DIAGNOSIS — I10 Essential (primary) hypertension: Secondary | ICD-10-CM | POA: Diagnosis not present

## 2012-10-08 NOTE — Telephone Encounter (Signed)
LM

## 2012-10-15 ENCOUNTER — Encounter (HOSPITAL_COMMUNITY): Payer: Self-pay | Admitting: Pharmacy Technician

## 2012-10-15 ENCOUNTER — Other Ambulatory Visit: Payer: Self-pay | Admitting: Interventional Cardiology

## 2012-10-15 DIAGNOSIS — E785 Hyperlipidemia, unspecified: Secondary | ICD-10-CM | POA: Diagnosis not present

## 2012-10-15 DIAGNOSIS — R0989 Other specified symptoms and signs involving the circulatory and respiratory systems: Secondary | ICD-10-CM | POA: Diagnosis not present

## 2012-10-15 DIAGNOSIS — I10 Essential (primary) hypertension: Secondary | ICD-10-CM | POA: Diagnosis not present

## 2012-10-15 DIAGNOSIS — I209 Angina pectoris, unspecified: Secondary | ICD-10-CM | POA: Diagnosis not present

## 2012-10-16 ENCOUNTER — Inpatient Hospital Stay (HOSPITAL_COMMUNITY)
Admission: RE | Admit: 2012-10-16 | Discharge: 2012-10-18 | DRG: 287 | Disposition: A | Discharge status: Home / Self Care | Payer: Medicare Other | Source: Ambulatory Visit | Attending: Interventional Cardiology | Admitting: Interventional Cardiology

## 2012-10-16 ENCOUNTER — Other Ambulatory Visit: Payer: Self-pay | Admitting: *Deleted

## 2012-10-16 ENCOUNTER — Encounter (HOSPITAL_COMMUNITY)
Admission: RE | Disposition: A | Discharge status: Home / Self Care | Payer: Self-pay | Source: Ambulatory Visit | Attending: Interventional Cardiology

## 2012-10-16 ENCOUNTER — Encounter (HOSPITAL_COMMUNITY): Payer: Medicare Other

## 2012-10-16 ENCOUNTER — Encounter (HOSPITAL_COMMUNITY): Payer: Self-pay | Admitting: General Practice

## 2012-10-16 DIAGNOSIS — I2510 Atherosclerotic heart disease of native coronary artery without angina pectoris: Secondary | ICD-10-CM

## 2012-10-16 DIAGNOSIS — I200 Unstable angina: Secondary | ICD-10-CM | POA: Diagnosis not present

## 2012-10-16 DIAGNOSIS — Z01810 Encounter for preprocedural cardiovascular examination: Secondary | ICD-10-CM | POA: Diagnosis not present

## 2012-10-16 DIAGNOSIS — Z8249 Family history of ischemic heart disease and other diseases of the circulatory system: Secondary | ICD-10-CM | POA: Diagnosis not present

## 2012-10-16 DIAGNOSIS — Z8679 Personal history of other diseases of the circulatory system: Secondary | ICD-10-CM | POA: Diagnosis not present

## 2012-10-16 DIAGNOSIS — E785 Hyperlipidemia, unspecified: Secondary | ICD-10-CM | POA: Diagnosis present

## 2012-10-16 DIAGNOSIS — I480 Paroxysmal atrial fibrillation: Secondary | ICD-10-CM

## 2012-10-16 DIAGNOSIS — Z8042 Family history of malignant neoplasm of prostate: Secondary | ICD-10-CM

## 2012-10-16 DIAGNOSIS — Z7982 Long term (current) use of aspirin: Secondary | ICD-10-CM | POA: Diagnosis not present

## 2012-10-16 DIAGNOSIS — R079 Chest pain, unspecified: Secondary | ICD-10-CM

## 2012-10-16 DIAGNOSIS — I4891 Unspecified atrial fibrillation: Secondary | ICD-10-CM | POA: Diagnosis not present

## 2012-10-16 DIAGNOSIS — I10 Essential (primary) hypertension: Secondary | ICD-10-CM | POA: Diagnosis not present

## 2012-10-16 DIAGNOSIS — I209 Angina pectoris, unspecified: Secondary | ICD-10-CM | POA: Diagnosis not present

## 2012-10-16 DIAGNOSIS — R9439 Abnormal result of other cardiovascular function study: Secondary | ICD-10-CM | POA: Diagnosis not present

## 2012-10-16 DIAGNOSIS — I2584 Coronary atherosclerosis due to calcified coronary lesion: Secondary | ICD-10-CM | POA: Diagnosis not present

## 2012-10-16 DIAGNOSIS — Z79899 Other long term (current) drug therapy: Secondary | ICD-10-CM | POA: Diagnosis not present

## 2012-10-16 HISTORY — PX: CARDIAC CATHETERIZATION: SHX172

## 2012-10-16 HISTORY — DX: Paroxysmal atrial fibrillation: I48.0

## 2012-10-16 HISTORY — DX: Atherosclerotic heart disease of native coronary artery without angina pectoris: I25.10

## 2012-10-16 HISTORY — PX: LEFT HEART CATHETERIZATION WITH CORONARY ANGIOGRAM: SHX5451

## 2012-10-16 LAB — PROTIME-INR
INR: 0.94 (ref 0.00–1.49)
Prothrombin Time: 12.5 seconds (ref 11.6–15.2)

## 2012-10-16 SURGERY — LEFT HEART CATHETERIZATION WITH CORONARY ANGIOGRAM
Anesthesia: LOCAL

## 2012-10-16 MED ORDER — LIDOCAINE HCL (PF) 1 % IJ SOLN
INTRAMUSCULAR | Status: AC
  Filled 2012-10-16: qty 30

## 2012-10-16 MED ORDER — ONDANSETRON HCL 4 MG/2ML IJ SOLN: 4.0000 mg | Freq: Four times a day (QID) | INTRAMUSCULAR | Status: DC | PRN

## 2012-10-16 MED ORDER — SODIUM CHLORIDE 0.9 % IJ SOLN: 3.0000 mL | INTRAMUSCULAR | Status: DC | PRN

## 2012-10-16 MED ORDER — SODIUM CHLORIDE 0.9 % IJ SOLN
3.0000 mL | Freq: Two times a day (BID) | INTRAMUSCULAR | Status: DC
  Administered 2012-10-16: 3 mL via INTRAVENOUS

## 2012-10-16 MED ORDER — MIDAZOLAM HCL 2 MG/2ML IJ SOLN
INTRAMUSCULAR | Status: AC
  Filled 2012-10-16: qty 2

## 2012-10-16 MED ORDER — ATORVASTATIN CALCIUM 10 MG PO TABS
10.0000 mg | ORAL_TABLET | Freq: Every day | ORAL | Status: DC
  Administered 2012-10-16 – 2012-10-17 (×2): 10 mg via ORAL
  Filled 2012-10-16 (×3): qty 1

## 2012-10-16 MED ORDER — ~~LOC~~ CARDIAC SURGERY, PATIENT & FAMILY EDUCATION
Freq: Once | Status: AC
  Administered 2012-10-16: 17:00:00
  Filled 2012-10-16: qty 1

## 2012-10-16 MED ORDER — AMLODIPINE BESYLATE 5 MG PO TABS
5.0000 mg | ORAL_TABLET | Freq: Every day | ORAL | Status: DC
  Administered 2012-10-17 – 2012-10-18 (×2): 5 mg via ORAL
  Filled 2012-10-16 (×2): qty 1

## 2012-10-16 MED ORDER — SODIUM CHLORIDE 0.9 % IV SOLN: 250.0000 mL | INTRAVENOUS | Status: DC | PRN

## 2012-10-16 MED ORDER — HYDROCHLOROTHIAZIDE 25 MG PO TABS
25.0000 mg | ORAL_TABLET | Freq: Every day | ORAL | Status: DC
  Administered 2012-10-17 – 2012-10-18 (×2): 25 mg via ORAL
  Filled 2012-10-16 (×3): qty 1

## 2012-10-16 MED ORDER — ACETAMINOPHEN 325 MG PO TABS: 650.0000 mg | ORAL_TABLET | ORAL | Status: DC | PRN

## 2012-10-16 MED ORDER — SODIUM CHLORIDE 0.9 % IV SOLN
INTRAVENOUS | Status: AC
  Administered 2012-10-16: 19:00:00 via INTRAVENOUS

## 2012-10-16 MED ORDER — CO Q 10 100 MG PO CAPS: 1.0000 | ORAL_CAPSULE | Freq: Every day | ORAL | Status: DC

## 2012-10-16 MED ORDER — VERAPAMIL HCL 2.5 MG/ML IV SOLN
INTRAVENOUS | Status: AC
  Filled 2012-10-16: qty 2

## 2012-10-16 MED ORDER — NITROGLYCERIN 1 MG/10 ML FOR IR/CATH LAB
INTRA_ARTERIAL | Status: AC
  Filled 2012-10-16: qty 10

## 2012-10-16 MED ORDER — ATENOLOL 100 MG PO TABS
100.0000 mg | ORAL_TABLET | Freq: Every day | ORAL | Status: DC
  Administered 2012-10-17 – 2012-10-18 (×2): 100 mg via ORAL
  Filled 2012-10-16 (×2): qty 1

## 2012-10-16 MED ORDER — FENTANYL CITRATE 0.05 MG/ML IJ SOLN
INTRAMUSCULAR | Status: AC
  Filled 2012-10-16: qty 2

## 2012-10-16 MED ORDER — BENAZEPRIL HCL 40 MG PO TABS
40.0000 mg | ORAL_TABLET | Freq: Every day | ORAL | Status: DC
  Administered 2012-10-16 – 2012-10-18 (×3): 40 mg via ORAL
  Filled 2012-10-16 (×3): qty 1

## 2012-10-16 MED ORDER — HEPARIN (PORCINE) IN NACL 2-0.9 UNIT/ML-% IJ SOLN
INTRAMUSCULAR | Status: AC
  Filled 2012-10-16: qty 1000

## 2012-10-16 MED ORDER — VITAMIN D3 25 MCG (1000 UNIT) PO TABS
1000.0000 [IU] | ORAL_TABLET | Freq: Every day | ORAL | Status: DC
  Administered 2012-10-16 – 2012-10-18 (×3): 1000 [IU] via ORAL
  Filled 2012-10-16 (×3): qty 1

## 2012-10-16 MED ORDER — DIAZEPAM 5 MG PO TABS
5.0000 mg | ORAL_TABLET | ORAL | Status: AC
  Administered 2012-10-16: 5 mg via ORAL
  Filled 2012-10-16: qty 1

## 2012-10-16 MED ORDER — ACETYL L-CARNITINE 250 MG PO CAPS: 1.0000 | ORAL_CAPSULE | Freq: Every day | ORAL | Status: DC

## 2012-10-16 MED ORDER — SODIUM CHLORIDE 0.9 % IV SOLN
INTRAVENOUS | Status: DC
  Administered 2012-10-16: 08:00:00 via INTRAVENOUS

## 2012-10-16 MED ORDER — ENOXAPARIN SODIUM 80 MG/0.8ML ~~LOC~~ SOLN
65.0000 mg | Freq: Two times a day (BID) | SUBCUTANEOUS | Status: DC
  Administered 2012-10-16 – 2012-10-17 (×3): 65 mg via SUBCUTANEOUS
  Administered 2012-10-18: 80 mg via SUBCUTANEOUS
  Filled 2012-10-16 (×6): qty 0.8

## 2012-10-16 MED ORDER — ASPIRIN EC 325 MG PO TBEC
325.0000 mg | DELAYED_RELEASE_TABLET | Freq: Every day | ORAL | Status: DC
  Administered 2012-10-17 – 2012-10-18 (×2): 325 mg via ORAL
  Filled 2012-10-16 (×2): qty 1

## 2012-10-16 MED ORDER — HEPARIN SODIUM (PORCINE) 1000 UNIT/ML IJ SOLN
INTRAMUSCULAR | Status: AC
  Filled 2012-10-16: qty 1

## 2012-10-16 MED ORDER — ASPIRIN 81 MG PO CHEW
324.0000 mg | CHEWABLE_TABLET | ORAL | Status: AC
  Administered 2012-10-16: 324 mg via ORAL
  Filled 2012-10-16: qty 4

## 2012-10-16 MED ORDER — NITROGLYCERIN 0.2 MG/HR TD PT24
0.2000 mg | MEDICATED_PATCH | Freq: Every day | TRANSDERMAL | Status: DC
  Administered 2012-10-16 – 2012-10-18 (×3): 0.2 mg via TRANSDERMAL
  Filled 2012-10-16 (×3): qty 1

## 2012-10-16 NOTE — Progress Notes (Signed)
ANTICOAGULATION CONSULT NOTE - Initial Consult  Pharmacy Consult for Lovenox Indication: chest pain/ACS  No Known Allergies  Patient Measurements: Height: 5\' 6"  (167.6 cm) Weight: 144 lb (65.318 kg) IBW/kg (Calculated) : 59.3  Vital Signs: Temp: 97.7 F (36.5 C) (04/24 1100) Temp src: Oral (04/24 1100) BP: 144/94 mmHg (04/24 1250) Pulse Rate: 83 (04/24 1250)  Labs:  Recent Labs  10/16/12 0740  LABPROT 12.5  INR 0.94    Estimated Creatinine Clearance: 62.1 ml/min (by C-G formula based on Cr of 0.7).   Medical History: Past Medical History  Diagnosis Date  . Hypertension     Dr.Henry Katrinka Blazing  . Hyperlipidemia   . Osteopenia 9/09  . Adenomatous colon polyp 10/09   Assessment: 54 YOF with chest pain, s/p cardiac cath this morning, with moderate 3-V CAD, and newly diagnosed paroxysmal afib during cath. Plan for TCTS consultation for possible CABG, pharmacy is consulted to start full dose lovenox 8 hrs post sheath removal (1044), no lovenox given prior to cath, INR 0.94, hgb 13.2, plt 212 on 4/14. No new scr since this admission, scr 0.7 on 4/14. Est. crcl ~ 60 ml/min  Goal of Therapy:  Anti-Xa level 0.6-1.2 units/ml 4hrs after LMWH dose given Monitor platelets by anticoagulation protocol: Yes   Plan:  - Lovenox 65 mg sq q 12hrs first dose this evening at 1845 - CBC every 3 days - Monitor renal function, and f/u plans regarding CABG  Bayard Hugger, PharmD, BCPS  Clinical Pharmacist  Pager: [phone]   10/16/2012,2:46 PM

## 2012-10-16 NOTE — H&P (Addendum)
Please see the scanned history and physical from [facility] cardiology.  The patient gives a history of limiting exertional chest and arm discomfort over the past 6-9 months. Minimal walking on the treadmill in the office yesterday let angina that prevented her from completing 4 minutes. A pharmacologic perfusion study demonstrated mild anteroapical ischemia and a fixed inferolateral defect. She tells me this morning that she had prolonged chest and bilateral arm discomfort last evening. This discomfort occurs spontaneously and lasted nearly an hour before resolving. She is pain-free this morning. Coronary angiography is being performed because of class III-IV angina and a multizone abnormality noted on perfusion imaging.  She has past medical history of osteopenia, hypertension, hyperlipidemia, and an adenomatous polyp.  There is a family history of premature coronary atherosclerosis, mother.  Prior surgery includes left breast lumpectomies x3 (no cancer), and hysterectomy.  Socially, she lives alone. 2 sons live here in [place]. She is a retired Arts administrator. She has never smoked. She has an occasional glass of wine.  Medications include atenolol 100 mg per day, amlodipine 5 mg per day, benazepril 40 mg per day, atorvastatin 10 mg per day, and aspirin. She takes a multitude of vitamins.  The physical exam reveals no acute distress. Skin color is clear. Neck exam reveals no JVD or carotid bruits. Chest is clear to auscultation and percussion. No murmur or gallop is heard. Abdomen soft. No bruits are heard. Extremities reveal no edema. Pulses femoral pulses and posterior tibial pulses are 2+. Neurological exam is unremarkable.  My assessment of the patient this morning is that she has class IV angina, having a prolonged episode last evening lasting longer than 30 minutes.  Plan will be to perform coronary angiography a she will likely need admission to the hospital if significant disease  is found. Hopefully PCI would be possible. She may need to surgical revascularization based upon the relatively mild abnormalities noted on nuclear despite her severe symptoms the    The procedure including the risks of stroke, death, myocardial infarction, bleeding, allergy, kidney injury, limb ischemia, among others were discussed in detail with the patient and accepted. We also discussed the possibility of stenting and the need for long-term antiplatelet therapy.

## 2012-10-16 NOTE — Progress Notes (Signed)
Utilization Review Completed Chera Slivka J. Zael Shuman, RN, BSN, NCM [phone]  

## 2012-10-16 NOTE — Progress Notes (Signed)
TR BAND REMOVAL  LOCATION:  right radial  DEFLATED PER PROTOCOL:  yes  TIME BAND OFF / DRESSING APPLIED:   1530   SITE UPON ARRIVAL:   Level 0  SITE AFTER BAND REMOVAL:  Level 0  REVERSE ALLEN'S TEST:    positive  CIRCULATION SENSATION AND MOVEMENT:  Within Normal Limits  yes  COMMENTS:

## 2012-10-16 NOTE — CV Procedure (Addendum)
     Diagnostic Cardiac Catheterization Report  Claudia Howell  72 y.o.  female 1940/12/02  Procedure Date: 10/16/2012 Referring Physician: Joselyn Arrow, M.D. Primary Cardiologist:: HWB Leia Alf, M.D.   PROCEDURE:  Left heart catheterization with selective coronary angiography, left ventriculogram.  INDICATIONS:  Class III-IV angina with early positive exercise treadmill test for angina and mild apical ischemia on nuclear perfusion 10/15/12  The risks, benefits, and details of the procedure were explained to the patient.  The patient verbalized understanding and wanted to proceed.  Informed written consent was obtained.  PROCEDURE TECHNIQUE:  After Xylocaine anesthesia a 6 French glide sheath slender was placed in the right radial artery with a single anterior needle wall stick.   Coronary angiography was done using a 5 [place] A2 MP catheter.  Left ventriculography was done using a 5 [place] A2 MP catheter.    CONTRAST:  Total of 80 cc.  COMPLICATIONS:  None.    HEMODYNAMICS:  Aortic pressure was 112/65 mmHg; LV pressure was 113/3 mmHg; LVEDP 6 mm mercury.  There was no gradient between the left ventricle and aorta.    ANGIOGRAPHIC DATA:   The left main coronary artery is widely patent. Moderate calcification is noted..  The left anterior descending artery is diffusely diseased starting just after the first diagonal proximally and extending into the mid vessel. There is focal 85-90% stenosis just beyond the second diagonal within this diffusely diseased region. The entire segment is moderately to severely calcified..  The left circumflex artery is 90% mid stenosis with segmental extension into a region of 50% narrowing. Moderate to severe calcification is noted.  The right coronary artery is highly diseased in the proximal, mid, and distal vessel. There is 50% proximal narrowing after the conus branch the mid vessel contains 90% stenosis. The mid vessel beyond the 90% stenosis is diffusely  diseased with up to 80% narrowing. The distal RCA before 3 left ventricular branches, 2 of which are small contains 99% stenosis with TIMI grade 2 flow. I suspect the distal right coronary is the culprit for the patient's rest pain.   LEFT VENTRICULOGRAM:  Left ventricular angiogram was done in the 30 RAO projection and revealed normal systolic function with mild mid inferior wall hypokinesis. The  estimated ejection fraction of 55%.    IMPRESSIONS:  1. Class IV angina due to severe three-vessel coronary disease as noted below.   2. Moderate three-vessel coronary calcification  3. Diffusely diseased proximal to mid LAD with up to 85% stenosis in the mid vessel.  4. 80-90% proximal to mid circumflex  5. Multifocal severe right coronary disease with mid 90% and 99% distal stenosis.  6. Overall normal left ventricular function, EF 60%  7. Paroxysmal atrial fibrillation, identified for the first time in the cath lab   RECOMMENDATION:  1. Placed in the hospital and start anticoagulation therapy and long-acting nitrates  2. TCTS consultation for recommendations concerning CABG.  3. Management of atrial fibrillation to include antithrombotic therapy . May be a candidate for Maze procedure during CABG

## 2012-10-17 ENCOUNTER — Encounter (HOSPITAL_COMMUNITY): Payer: Self-pay | Admitting: Thoracic Surgery (Cardiothoracic Vascular Surgery)

## 2012-10-17 ENCOUNTER — Inpatient Hospital Stay (HOSPITAL_COMMUNITY): Payer: Medicare Other

## 2012-10-17 ENCOUNTER — Other Ambulatory Visit: Payer: Self-pay | Admitting: *Deleted

## 2012-10-17 DIAGNOSIS — I200 Unstable angina: Secondary | ICD-10-CM | POA: Diagnosis not present

## 2012-10-17 DIAGNOSIS — I2510 Atherosclerotic heart disease of native coronary artery without angina pectoris: Secondary | ICD-10-CM

## 2012-10-17 DIAGNOSIS — R079 Chest pain, unspecified: Secondary | ICD-10-CM | POA: Diagnosis not present

## 2012-10-17 DIAGNOSIS — I10 Essential (primary) hypertension: Secondary | ICD-10-CM | POA: Diagnosis not present

## 2012-10-17 DIAGNOSIS — Z01810 Encounter for preprocedural cardiovascular examination: Secondary | ICD-10-CM

## 2012-10-17 DIAGNOSIS — R9439 Abnormal result of other cardiovascular function study: Secondary | ICD-10-CM | POA: Diagnosis not present

## 2012-10-17 DIAGNOSIS — Z8679 Personal history of other diseases of the circulatory system: Secondary | ICD-10-CM | POA: Diagnosis not present

## 2012-10-17 DIAGNOSIS — I2584 Coronary atherosclerosis due to calcified coronary lesion: Secondary | ICD-10-CM | POA: Diagnosis not present

## 2012-10-17 DIAGNOSIS — I4891 Unspecified atrial fibrillation: Secondary | ICD-10-CM | POA: Diagnosis not present

## 2012-10-17 LAB — COMPREHENSIVE METABOLIC PANEL
ALT: 15 U/L (ref 0–35)
ALT: 15 U/L (ref 0–35)
AST: 22 U/L (ref 0–37)
AST: 23 U/L (ref 0–37)
Albumin: 3.9 g/dL (ref 3.5–5.2)
Albumin: 4.1 g/dL (ref 3.5–5.2)
Alkaline Phosphatase: 50 U/L (ref 39–117)
Alkaline Phosphatase: 50 U/L (ref 39–117)
BUN: 11 mg/dL (ref 6–23)
BUN: 11 mg/dL (ref 6–23)
CO2: 26 mEq/L (ref 19–32)
CO2: 28 mEq/L (ref 19–32)
Calcium: 9.5 mg/dL (ref 8.4–10.5)
Calcium: 9.9 mg/dL (ref 8.4–10.5)
Chloride: 103 mEq/L (ref 96–112)
Chloride: 99 mEq/L (ref 96–112)
Creatinine, Ser: 0.66 mg/dL (ref 0.50–1.10)
Creatinine, Ser: 0.72 mg/dL (ref 0.50–1.10)
GFR calc Af Amer: >90 mL/min (ref >90)
GFR calc Af Amer: >90 mL/min (ref >90)
GFR calc non Af Amer: 88 mL/min — ABNORMAL LOW (ref >90)
GFR calc non Af Amer: 86 mL/min — ABNORMAL LOW (ref >90)
Glucose, Bld: 109 mg/dL — ABNORMAL HIGH (ref 70–99)
Glucose, Bld: 132 mg/dL — ABNORMAL HIGH (ref 70–99)
Potassium: 3.7 mEq/L (ref 3.5–5.1)
Potassium: 3.9 mEq/L (ref 3.5–5.1)
Sodium: 138 mEq/L (ref 135–145)
Sodium: 137 mEq/L (ref 135–145)
Total Bilirubin: 0.2 mg/dL — ABNORMAL LOW (ref 0.3–1.2)
Total Bilirubin: 0.3 mg/dL (ref 0.3–1.2)
Total Protein: 7.6 g/dL (ref 6.0–8.3)
Total Protein: 7.9 g/dL (ref 6.0–8.3)

## 2012-10-17 LAB — LIPID PANEL
Cholesterol: 195 mg/dL (ref 0–200)
HDL: 54 mg/dL (ref >39)
LDL Cholesterol: 108 mg/dL — ABNORMAL HIGH (ref 0–99)
Total CHOL/HDL Ratio: 3.6 RATIO
Triglycerides: 165 mg/dL — ABNORMAL HIGH (ref <150)
VLDL: 33 mg/dL (ref 0–40)

## 2012-10-17 LAB — CBC
HCT: 36.1 % (ref 36.0–46.0)
HCT: 37.1 % (ref 36.0–46.0)
Hemoglobin: 12.8 g/dL (ref 12.0–15.0)
Hemoglobin: 13 g/dL (ref 12.0–15.0)
MCH: 31.9 pg (ref 26.0–34.0)
MCH: 31.5 pg (ref 26.0–34.0)
MCHC: 35.5 g/dL (ref 30.0–36.0)
MCHC: 35 g/dL (ref 30.0–36.0)
MCV: 90 fL (ref 78.0–100.0)
MCV: 89.8 fL (ref 78.0–100.0)
Platelets: 212 10*3/uL (ref 150–400)
Platelets: 208 10*3/uL (ref 150–400)
RBC: 4.01 MIL/uL (ref 3.87–5.11)
RBC: 4.13 MIL/uL (ref 3.87–5.11)
RDW: 12.4 % (ref 11.5–15.5)
RDW: 12.3 % (ref 11.5–15.5)
WBC: 8.5 10*3/uL (ref 4.0–10.5)
WBC: 8.5 10*3/uL (ref 4.0–10.5)

## 2012-10-17 LAB — BLOOD GAS, ARTERIAL
Acid-base deficit: 0 mmol/L (ref 0.0–2.0)
Bicarbonate: 23.9 mEq/L (ref 20.0–24.0)
Drawn by: 242311
O2 Saturation: 97.5 %
Patient temperature: 98.6
TCO2: 25 mmol/L (ref 0–100)
pCO2 arterial: 37.2 mmHg (ref 35.0–45.0)
pH, Arterial: 7.423 (ref 7.350–7.450)
pO2, Arterial: 79.6 mmHg — ABNORMAL LOW (ref 80.0–100.0)

## 2012-10-17 LAB — URINALYSIS, ROUTINE W REFLEX MICROSCOPIC
Bilirubin Urine: NEGATIVE
Glucose, UA: NEGATIVE mg/dL
Hgb urine dipstick: NEGATIVE
Ketones, ur: NEGATIVE mg/dL
Leukocytes, UA: NEGATIVE
Nitrite: NEGATIVE
Protein, ur: NEGATIVE mg/dL
Specific Gravity, Urine: 1.011 (ref 1.005–1.030)
Urobilinogen, UA: 0.2 mg/dL (ref 0.0–1.0)
pH: 5.5 (ref 5.0–8.0)

## 2012-10-17 LAB — MRSA PCR SCREENING: MRSA by PCR: POSITIVE — AB

## 2012-10-17 LAB — HEMOGLOBIN A1C
Hgb A1c MFr Bld: 5.7 % — ABNORMAL HIGH (ref <5.7)
Mean Plasma Glucose: 117 mg/dL — ABNORMAL HIGH (ref <117)

## 2012-10-17 LAB — APTT: aPTT: 38 seconds — ABNORMAL HIGH (ref 24–37)

## 2012-10-17 LAB — TYPE AND SCREEN
ABO/RH(D): A NEG
Antibody Screen: NEGATIVE

## 2012-10-17 LAB — PULMONARY FUNCTION TEST

## 2012-10-17 LAB — ABO/RH: ABO/RH(D): A NEG

## 2012-10-17 MED ORDER — AMIODARONE HCL 200 MG PO TABS: 200.0000 mg | ORAL_TABLET | Freq: Two times a day (BID) | ORAL | Status: DC

## 2012-10-17 MED ORDER — MUPIROCIN 2 % EX OINT: 1.0000 "application " | TOPICAL_OINTMENT | Freq: Two times a day (BID) | CUTANEOUS | Status: DC

## 2012-10-17 MED ORDER — AMIODARONE HCL 200 MG PO TABS
200.0000 mg | ORAL_TABLET | Freq: Two times a day (BID) | ORAL | Status: DC
  Administered 2012-10-17: 200 mg via ORAL
  Filled 2012-10-17 (×3): qty 1

## 2012-10-17 MED ORDER — MUPIROCIN 2 % EX OINT
1.0000 "application " | TOPICAL_OINTMENT | Freq: Two times a day (BID) | CUTANEOUS | Status: DC
  Administered 2012-10-17 – 2012-10-18 (×4): 1 via NASAL
  Filled 2012-10-17: qty 22

## 2012-10-17 MED ORDER — NITROGLYCERIN 0.2 MG/HR TD PT24: 1.0000 | MEDICATED_PATCH | Freq: Every day | TRANSDERMAL | Status: DC

## 2012-10-17 MED ORDER — ASPIRIN 325 MG PO TBEC: 325.0000 mg | DELAYED_RELEASE_TABLET | Freq: Every day | ORAL | Status: DC

## 2012-10-17 MED ORDER — CHLORHEXIDINE GLUCONATE CLOTH 2 % EX PADS
6.0000 | MEDICATED_PAD | Freq: Every day | CUTANEOUS | Status: DC
  Administered 2012-10-17 – 2012-10-18 (×2): 6 via TOPICAL

## 2012-10-17 NOTE — Progress Notes (Signed)
1110-1140 Cardiac Rehab Pt up walking independently in hall, denies any problems. Completed pre-op education with pt and family. Discussed sternal precautions, ambulation and use of IS. Pt voices understanding. Gave pt Cardiac Surgery pt and family education booklet.  Melina Copa RN

## 2012-10-17 NOTE — Progress Notes (Signed)
Patient Name: Claudia Howell Date of Encounter: 10/17/2012    SUBJECTIVE: No surgical evaluation yet. Will check to make sure that message was not dropped. Spoke to [employer] personally. Mild chest discomfort last PM.. Currently pain free.  HA from NTG.  TELEMETRY:  NSR returned since cath. Filed Vitals:   10/16/12 1900 10/16/12 2025 10/17/12 0025 10/17/12 0500  BP:  114/65 94/46 119/51  Pulse: 65 66 63 68  Temp:  97.8 F (36.6 C) 98.2 F (36.8 C) 98.2 F (36.8 C)  TempSrc:  Oral Oral Oral  Resp:  18 18 18   Height:      Weight:   68 kg (149 lb 14.6 oz)   SpO2: 97% 97% 99% 95%    Intake/Output Summary (Last 24 hours) at 10/17/12 0728 Last data filed at 10/17/12 0600  Gross per 24 hour  Intake   1540 ml  Output   1900 ml  Net   -360 ml    LABS: Basic Metabolic Panel: No results found for this basename: NA, K, CL, CO2, GLUCOSE, BUN, CREATININE, CALCIUM, MG, PHOS,  in the last 72 hours CBC:  Recent Labs  10/17/12 0613  WBC 8.5  HGB 12.8  HCT 36.1  MCV 90.0  PLT 212   Radiology/Studies: CHEST - 2 VIEW  Comparison: None.  Findings: Heart is upper limits normal in size. Minimal densities  in the lingula, likely scarring. Right lung is clear. Biapical  pleural/parenchymal densities, likely scarring. No effusions. No  acute bony abnormality.  IMPRESSION:  Biapical and left basilar scarring. No acute findings.  Original    Physical Exam: Blood pressure 119/51, pulse 68, temperature 98.2 F (36.8 C), temperature source Oral, resp. rate 18, height 5\' 6"  (1.676 m), weight 68 kg (149 lb 14.6 oz), SpO2 95.00%. Weight change:    Normal exam. Right radial cath site okay.  ASSESSMENT:  1. Severe 3 vessel CAD with class 4 angina, likely from the RCA and/or Cfx territory. LAD is severely diseased but not critical. 2. Paroxysmal atrial fibrillation, now back in NSR.  Plan:  1. Speak with the CV surgical service about timing. Home vs In house until surgery.  2. She is having  rest pain. We could do RCA and Cfx stenting, but probably not the best long term approach.  Selinda Eon 10/17/2012, 7:28 AM

## 2012-10-17 NOTE — Consult Note (Signed)
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 The patient states that approximately 11 months ago she first began to experience intermittent episodes of substernal chest discomfort radiating down her arms that occurs with physical activity. Over the past summer these symptoms were somewhat sporadic, but over the last few months they have accelerated considerably. Since Christmas the patient has had 3 separate episodes of prolonged substernal chest discomfort lasting 2-3 hours, most recently one that occurred just prior to presentation with Dr. Katrinka Blazing. The patient was admitted for diagnostic cardiac catheterization yesterday which revealed severe three-vessel coronary artery disease with preserved left ventricular function. During the procedure the patient also was noted to develop paroxysmal atrial fibrillation which terminated spontaneously.  Cardiothoracic surgical consultation was requested.  The patient reports that prior to 11 months ago she had remained physically quite active for her age. She enjoys walking and exercising on a regular basis. However, over the past 11 months she has had to cut her activity level considerably. She gets tired quite easily. She has had accelerating symptoms of chest discomfort with activity, and recently symptoms occur with relatively mild activity. She denies nocturnal chest pain. She denies any history of resting shortness of breath, PND, orthopnea, or lower extremity edema. She has had occasional palpitations without dizzy spells or syncope.  Past Medical History  Diagnosis Date  .  Hypertension     Dr.Henry Katrinka Blazing  . Hyperlipidemia   . Osteopenia 9/09  . Adenomatous colon polyp 10/09  . Chest pain on exertion     "escalating over last few months" (10/16/2012)  . Coronary artery disease   . PAF (paroxysmal atrial fibrillation)     during cath/notes 10/16/2012    Past Surgical History  Procedure Laterality Date  . Total abdominal hysterectomy w/ bilateral salpingoophorectomy  1990    benign growth  . Cardiac catheterization  10/16/2012  . Tonsillectomy  1949  . Abdominal hysterectomy    . Breast cyst excision Left 1980-1990's    x 3    Family History  Problem Relation Age of Onset  . Hypertension Mother   . Heart attack Mother   . Heart disease Mother   . Hypertension Brother   . Cancer Brother     prostate  . Diabetes Neg Hx   . Breast cancer Neg Hx   . Colon cancer Neg Hx     History   Social History  . Marital Status: Widowed    Spouse Name: N/A    Number of Children: 2  . Years of Education: N/A   Occupational History  . retired    Social History Main Topics  . Smoking status: Never Smoker   . Smokeless tobacco: Never Used  . Alcohol Use: 0.6 oz/week    1 Glasses of wine per week     Comment: 1 glass of wine weekly (when playing cards)  . Drug Use: No  . Sexually Active: No   Other Topics Concern  . 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  Retired Public house manager.  1 son in [place], 1 son in  [place].    Prior to Admission medications   Medication Sig Start Date End Date Taking? Authorizing Provider  Acetylcarnitine HCl (ACETYL L-CARNITINE) 250 MG CAPS Take 1 capsule by mouth daily.   Yes Historical Provider, MD  amLODipine (NORVASC) 5 MG tablet Take 5 mg by mouth daily.   Yes Historical Provider, MD  atenolol (TENORMIN) 100 MG tablet Take 100 mg by mouth daily.   Yes Historical Provider, MD  atorvastatin (LIPITOR) 10 MG tablet Take 1 tablet (10 mg total) by mouth daily. 07/18/12 07/18/13 Yes Joselyn Arrow, MD  benazepril  (LOTENSIN) 40 MG tablet Take 40 mg by mouth daily.   Yes Historical Provider, MD  Black Cohosh 40 MG CAPS Take 1 capsule by mouth daily.   Yes Historical Provider, MD  cholecalciferol (VITAMIN D) 1000 UNITS tablet Take 1,000 Units by mouth daily.   Yes Historical Provider, MD  Coenzyme Q10 (CO Q 10) 100 MG CAPS Take 1 capsule by mouth daily.   Yes Historical Provider, MD  CVS BETA CAROTENE PO Take 1 tablet by mouth daily.   Yes Historical Provider, MD  Glucosamine-Chondroitin-MSM 500-400-300 MG TABS Take 2 tablets by mouth daily.   Yes Historical Provider, MD  hydrochlorothiazide (HYDRODIURIL) 25 MG tablet Take 25 mg by mouth daily.   Yes Historical Provider, MD  Multiple Vitamins-Minerals (MULTIVITAMIN WITH MINERALS) tablet Take 1 tablet by mouth daily.   Yes Historical Provider, MD  multivitamin-lutein (OCUVITE-LUTEIN) CAPS Take 1 capsule by mouth daily.   Yes Historical Provider, MD  Nutritional Supplements (ESTROVEN MAXIMUM STRENGTH) TABS Take 1 tablet by mouth daily.   Yes Historical Provider, MD  Zinc 25 MG TABS Take 1 tablet by mouth daily.   Yes Historical Provider, MD    Current Facility-Administered Medications  Medication Dose Route Frequency Provider Last Rate Last Dose  . acetaminophen (TYLENOL) tablet 650 mg  650 mg Oral Q4H PRN Lyn Records III, MD      . amLODipine (NORVASC) tablet 5 mg  5 mg Oral Daily Lyn Records III, MD      . aspirin EC tablet 325 mg  325 mg Oral Daily Lyn Records III, MD      . atenolol (TENORMIN) tablet 100 mg  100 mg Oral Daily Lyn Records III, MD      . atorvastatin (LIPITOR) tablet 10 mg  10 mg Oral q1800 Lyn Records III, MD   10 mg at 10/16/12 1742  . benazepril (LOTENSIN) tablet 40 mg  40 mg Oral Daily Lyn Records III, MD   40 mg at 10/16/12 2036  . Chlorhexidine Gluconate Cloth 2 % PADS 6 each  6 each Topical Q0600 [name], MD   6 each at 10/17/12 [phone]  . cholecalciferol (VITAMIN D) tablet 1,000 Units  1,000 Units Oral Daily  Lyn Records III, MD   1,000 Units at 10/16/12 1358  . enoxaparin (LOVENOX) injection 65 mg  65 mg Subcutaneous Q12H Lyn Records III, MD   65 mg at 10/17/12 0555  . hydrochlorothiazide (HYDRODIURIL) tablet 25 mg  25 mg Oral Daily Lyn Records III, MD      . mupirocin ointment (BACTROBAN) 2 % 1 application  1 application Nasal BID [name], MD   1 application at 10/17/12 0150  . nitroGLYCERIN (NITRODUR - Dosed in mg/24 hr) patch 0.2 mg  0.2 mg Transdermal Daily Lyn Records III, MD   0.2 mg at 10/16/12 1403  .  ondansetron (ZOFRAN) injection 4 mg  4 mg Intravenous Q6H PRN [name], MD        No Known Allergies    Review of Systems:   General:  normal appetite, decrease energy, no weight gain, no weight loss, no fever  Cardiac:  + chest pain with exertion, + occasional chest pain at rest, + SOB with moderate exertion, no resting SOB, no PND, no orthopnea, + palpitations, + arrhythmia, + atrial fibrillation, no LE edema, no dizzy spells, no syncope  Respiratory:  no shortness of breath, no home oxygen, no productive cough, + dry cough, no bronchitis, no wheezing, no hemoptysis, no asthma, no pain with inspiration or cough, no sleep apnea, no CPAP at night  GI:   no difficulty swallowing, occasional reflux, no frequent heartburn, no hiatal hernia, no abdominal pain, no constipation, no diarrhea, no hematochezia, no hematemesis, no melena  GU:   no dysuria,  no frequency, no urinary tract infection, no hematuria, no kidney stones, no kidney disease  Vascular:  no pain suggestive of claudication, no pain in feet, no leg cramps, no varicose veins, no DVT, no non-healing foot ulcer  Neuro:   no stroke, no TIA's, no seizures, no headaches, no temporary blindness one eye,  no slurred speech, no peripheral neuropathy, no chronic pain, no instability of gait, no memory/cognitive dysfunction  Musculoskeletal: mild arthritis primarily in hips, no joint swelling, no myalgias, no  difficulty walking, normal mobility   Skin:   no rash, no itching, no skin infections, no pressure sores or ulcerations  Psych:   no anxiety, no depression, no nervousness, no unusual recent stress  Eyes:   no blurry vision, no floaters, no recent vision changes, + wears glasses or contacts  ENT:   no hearing loss, no loose or painful teeth, no dentures  Hematologic:  no easy bruising, no abnormal bleeding, no clotting disorder, no frequent epistaxis  Endocrine:  no diabetes, does not check CBG's at home     Physical Exam:   BP 162/83  Pulse 77  Temp(Src) 98.1 F (36.7 C) (Oral)  Resp 18  Ht 5\' 6"  (1.676 m)  Wt 68 kg (149 lb 14.6 oz)  BMI 24.21 kg/m2  SpO2 100%  General:    well-appearing  HEENT:  Unremarkable   Neck:   no JVD, no bruits, no adenopathy   Chest:   clear to auscultation, symmetrical breath sounds, no wheezes, no rhonchi   CV:   RRR, no murmur   Abdomen:  soft, non-tender, no masses   Extremities:  warm, well-perfused, pulses palpable  Rectal/GU  Deferred  Neuro:   Grossly non-focal and symmetrical throughout  Skin:   Clean and dry, no rashes, no breakdown  Diagnostic Tests:   Diagnostic Cardiac Catheterization Report  Claudia Howell  72 y.o.  female  03-19-1941  Procedure Date: 10/16/2012  Referring Physician: Joselyn Arrow, M.D.  Primary Cardiologist:: HWB Leia Alf, M.D.  PROCEDURE: Left heart catheterization with selective coronary angiography, left ventriculogram.  INDICATIONS: Class III-IV angina with early positive exercise treadmill test for angina and mild apical ischemia on nuclear perfusion 10/15/12  The risks, benefits, and details of the procedure were explained to the patient. The patient verbalized understanding and wanted to proceed. Informed written consent was obtained.  PROCEDURE TECHNIQUE: After Xylocaine anesthesia a 6 French glide sheath slender was placed in the right radial artery with a single anterior needle wall stick. Coronary angiography  was done using a 5 [place] A2 MP  catheter. Left ventriculography was done using a 5 [place] A2 MP catheter.  CONTRAST: Total of 80 cc.  COMPLICATIONS: None.  HEMODYNAMICS: Aortic pressure was 112/65 mmHg; LV pressure was 113/3 mmHg; LVEDP 6 mm mercury. There was no gradient between the left ventricle and aorta.  ANGIOGRAPHIC DATA: The left main coronary artery is widely patent. Moderate calcification is noted..  The left anterior descending artery is diffusely diseased starting just after the first diagonal proximally and extending into the mid vessel. There is focal 85-90% stenosis just beyond the second diagonal within this diffusely diseased region. The entire segment is moderately to severely calcified..  The left circumflex artery is 90% mid stenosis with segmental extension into a region of 50% narrowing. Moderate to severe calcification is noted.  The right coronary artery is highly diseased in the proximal, mid, and distal vessel. There is 50% proximal narrowing after the conus branch the mid vessel contains 90% stenosis. The mid vessel beyond the 90% stenosis is diffusely diseased with up to 80% narrowing. The distal RCA before 3 left ventricular branches, 2 of which are small contains 99% stenosis with TIMI grade 2 flow. I suspect the distal right coronary is the culprit for the patient's rest pain.  LEFT VENTRICULOGRAM: Left ventricular angiogram was done in the 30 RAO projection and revealed normal systolic function with mild mid inferior wall hypokinesis. The estimated ejection fraction of 55%.  IMPRESSIONS: 1. Class IV angina due to severe three-vessel coronary disease as noted below.  2. Moderate three-vessel coronary calcification  3. Diffusely diseased proximal to mid LAD with up to 85% stenosis in the mid vessel.  4. 80-90% proximal to mid circumflex  5. Multifocal severe right coronary disease with mid 90% and 99% distal stenosis.  6. Overall normal left ventricular function, EF 60%    7. Paroxysmal atrial fibrillation, identified for the first time in the cath lab  RECOMMENDATION: 1. Placed in the hospital and start anticoagulation therapy and long-acting nitrates  2. TCTS consultation for recommendations concerning CABG.  3. Management of atrial fibrillation to include antithrombotic therapy . May be a candidate for Maze procedure during CABG    Impression:  The patient presents with a crescendo pattern of unstable angina and has severe three-vessel coronary artery disease with preserved left ventricular function. Coronary anatomy is relatively unfavorable for percutaneous coronary intervention.  The patient also describes history of occasional palpitations and was found to have an episode of paroxysmal atrial fibrillation during cardiac catheterization yesterday. I agree that her coronary artery disease would best be treated with surgical revascularization. She may also benefit from concomitant Maze procedure to decrease risk of recurrent paroxysmal atrial fibrillation in the long-term future.    Plan:  I've reviewed the indications, risks, and potential benefits of surgery with the patient and 2 of her family members here in the hospital today. Alternative treatment strategies were discussed in detail. The patient understands and accepts all potential associated risks of surgery including but not limited to risk of death, stroke, myocardial infarction, congestive heart failure, respiratory failure, renal failure, bleeding requiring blood transfusion, arrhythmia, infection, and late recurrence of coronary artery disease. All of their questions been addressed. We will plan to begin the patient on amiodarone preoperatively. We tentatively plan to proceed with surgery on Wednesday, April 30. If the patient is to be discharged from the hospital between now and the time of surgery we will make arrangements for her to be admitted on the morning of April 30 via short  stay.    Salvatore Decent. Cornelius Moras, MD 10/17/2012 9:56 AM  I spent in excess of 90 minutes of time directly involved in the conduct of this consultation.

## 2012-10-17 NOTE — Progress Notes (Signed)
VASCULAR LAB PRELIMINARY  PRELIMINARY  PRELIMINARY  PRELIMINARY  Pre-op Cardiac Surgery  Carotid Findings:  Bilateral:  No evidence of hemodynamically significant internal carotid artery stenosis.   Vertebral artery flow is antegrade on the right and not insonated on the left.   Upper Extremity Right Left  Brachial Pressures 145 Triphasic 150 Triphasic  Radial Waveforms Triphasic Triphasic  Ulnar Waveforms Triphasic Triphasic  Palmar Arch (Allen's Test) Normal Normal    Findings:  Doppler waveforms remained normal bilaterally with both radial and ulnar compressions    Lower  Extremity Right Left  Dorsalis Pedis    Anterior Tibial    Posterior Tibial    Ankle/Brachial Indices      Findings:  Pedal pulse were palpable bilaterally.   Geraldy Akridge, RVS 10/17/2012, 1:43 PM

## 2012-10-18 DIAGNOSIS — I10 Essential (primary) hypertension: Secondary | ICD-10-CM | POA: Diagnosis not present

## 2012-10-18 DIAGNOSIS — Z8679 Personal history of other diseases of the circulatory system: Secondary | ICD-10-CM | POA: Diagnosis not present

## 2012-10-18 DIAGNOSIS — R9439 Abnormal result of other cardiovascular function study: Secondary | ICD-10-CM | POA: Diagnosis not present

## 2012-10-18 DIAGNOSIS — I2584 Coronary atherosclerosis due to calcified coronary lesion: Secondary | ICD-10-CM | POA: Diagnosis not present

## 2012-10-18 DIAGNOSIS — I4891 Unspecified atrial fibrillation: Secondary | ICD-10-CM | POA: Diagnosis not present

## 2012-10-18 DIAGNOSIS — I2510 Atherosclerotic heart disease of native coronary artery without angina pectoris: Secondary | ICD-10-CM | POA: Diagnosis not present

## 2012-10-18 DIAGNOSIS — I200 Unstable angina: Secondary | ICD-10-CM | POA: Diagnosis not present

## 2012-10-18 DIAGNOSIS — R079 Chest pain, unspecified: Secondary | ICD-10-CM | POA: Diagnosis not present

## 2012-10-18 MED ORDER — NITROGLYCERIN 0.4 MG SL SUBL: 0.4000 mg | SUBLINGUAL_TABLET | SUBLINGUAL | Status: DC | PRN

## 2012-10-18 MED ORDER — ASPIRIN 325 MG PO TBEC: 325.0000 mg | DELAYED_RELEASE_TABLET | Freq: Every day | ORAL | Status: DC

## 2012-10-18 MED ORDER — AMIODARONE HCL 200 MG PO TABS: 200.0000 mg | ORAL_TABLET | Freq: Two times a day (BID) | ORAL | Status: DC

## 2012-10-18 MED ORDER — NITROGLYCERIN 0.2 MG/HR TD PT24: 1.0000 | MEDICATED_PATCH | Freq: Every day | TRANSDERMAL | Status: DC

## 2012-10-18 NOTE — Discharge Summary (Signed)
Patient ID: Claudia Howell MRN: [id number] DOB/AGE: 1940/08/02 72 y.o.  Admit date: 10/16/2012 Discharge date: 10/18/2012  Primary Discharge Diagnosis: Class 3-4 angina Secondary Discharge Diagnosis: Severe 3 vessel CAD by cath 10/16/12 Hypertension Hyperlipidemia Paroxysmal atrialfibrillation  Significant Diagnostic Studies: Coronary angiography, 10/16/12  Consults: TCTS, Dr [facility] Course: 72 yo admitted after cath for unstable/class 4 angina at rest. She has severe 3 vessel CAD documented by cath.  After admission, symptoms were controlled with medications including beta blocker therapy, anticoagulants,  and addition of LA NTG. She was seen by surgery and felt to be a candidate for CABG, on 10/22/12.   Atrial fibrillation was identified coincidentally, during coronary angiography. After several hours,it resolved spontaneously. Amiodarone was started to prevent recurrences. Oral anticoagulation was not felt be prudent given the short duration between discharge and anticipated CABG.  After ambulation and remaining stable, she is discharge with arrangement to be made by Dr. Cornelius Moras for same day admission for CABG on 10/22/12.  She is expressly instructed to return to hospital if any recurrence of chest pain. She is instructed on the use of sl NTG.    Discharge Exam: Blood pressure 132/73, pulse 67, temperature 98.3 F (36.8 C), temperature source Oral, resp. rate 18, height 5\' 6"  (1.676 m), weight 66.1 kg (145 lb 11.6 oz), SpO2 98.00%.   Unremarkable right radial cath site. Labs:   Lab Results  Component Value Date   WBC 8.5 10/17/2012   HGB 13.0 10/17/2012   HCT 37.1 10/17/2012   MCV 89.8 10/17/2012   PLT 208 10/17/2012    Recent Labs Lab 10/17/12 1624  NA 137  K 3.9  CL 99  CO2 28  BUN 11  CREATININE 0.72  CALCIUM 9.9  PROT 7.9  BILITOT 0.3  ALKPHOS 50  ALT 15  AST 23  GLUCOSE 132*   No results found for this basename: CKTOTAL, CKMB, CKMBINDEX, TROPONINI    Lab Results  Component Value Date   CHOL 195 10/17/2012   CHOL 159 07/17/2012   CHOL 233* 09/26/2011   Lab Results  Component Value Date   HDL 54 10/17/2012   HDL 48 10/01/8117   HDL 53 06/29/7827   Lab Results  Component Value Date   LDLCALC 108* 10/17/2012   LDLCALC 85 07/17/2012   LDLCALC 148* 09/26/2011   Lab Results  Component Value Date   TRIG 165* 10/17/2012   TRIG 129 07/17/2012   TRIG 158* 09/26/2011   Lab Results  Component Value Date   CHOLHDL 3.6 10/17/2012   CHOLHDL 3.3 07/17/2012   CHOLHDL 4.4 09/26/2011   No results found for this basename: LDLDIRECT      Radiology: Mild abnormality EKG: Normal  FOLLOW UP PLANS AND APPOINTMENTS    Medication List    STOP taking these medications       Black Cohosh 40 MG Caps      TAKE these medications       Acetyl L-Carnitine 250 MG Caps  Take 1 capsule by mouth daily.     amiodarone 200 MG tablet  Commonly known as:  PACERONE  Take 1 tablet (200 mg total) by mouth 2 (two) times daily.     amLODipine 5 MG tablet  Commonly known as:  NORVASC  Take 5 mg by mouth daily.     aspirin 325 MG EC tablet  Take 1 tablet (325 mg total) by mouth daily.     atenolol 100 MG tablet  Commonly known as:  TENORMIN  Take 100 mg by mouth daily.     atorvastatin 10 MG tablet  Commonly known as:  LIPITOR  Take 1 tablet (10 mg total) by mouth daily.     benazepril 40 MG tablet  Commonly known as:  LOTENSIN  Take 40 mg by mouth daily.     cholecalciferol 1000 UNITS tablet  Commonly known as:  VITAMIN D  Take 1,000 Units by mouth daily.     Co Q 10 100 MG Caps  Take 1 capsule by mouth daily.     CVS BETA CAROTENE PO  Take 1 tablet by mouth daily.     [place]ro[place] Maximum Strength Tabs  Take 1 tablet by mouth daily.     Glucosamine-Chondroitin-MSM 500-400-300 MG Tabs  Take 2 tablets by mouth daily.     hydrochlorothiazide 25 MG tablet  Commonly known as:  HYDRODIURIL  Take 25 mg by mouth daily.     multivitamin with  minerals tablet  Take 1 tablet by mouth daily.     multivitamin-lutein Caps  Take 1 capsule by mouth daily.     mupirocin ointment 2 %  Commonly known as:  BACTROBAN  Apply 1 application topically 2 (two) times daily.     nitroGLYCERIN 0.2 mg/hr  Commonly known as:  NITRODUR - Dosed in mg/24 hr  Place 1 patch (0.2 mg total) onto the skin daily.     nitroGLYCERIN 0.4 MG SL tablet  Commonly known as:  NITROSTAT  Place 1 tablet (0.4 mg total) under the tongue every 5 (five) minutes as needed for chest pain.     Zinc 25 MG Tabs  Take 1 tablet by mouth daily.           Follow-up Information   Follow up with Purcell Nails, MD. (Will call with etails about surgery)    Contact information:   [address] [place] [address] [phone]       BRING ALL MEDICATIONS WITH YOU TO FOLLOW UP APPOINTMENTS  Time spent with patient to include physician time: 30 mins Signed: [name] 10/18/2012, 9:26 AM

## 2012-10-19 ENCOUNTER — Other Ambulatory Visit: Payer: Self-pay | Admitting: Family Medicine

## 2012-10-20 ENCOUNTER — Encounter (HOSPITAL_COMMUNITY): Payer: Medicare Other

## 2012-10-20 ENCOUNTER — Other Ambulatory Visit: Payer: Self-pay | Admitting: *Deleted

## 2012-10-20 DIAGNOSIS — I2510 Atherosclerotic heart disease of native coronary artery without angina pectoris: Secondary | ICD-10-CM

## 2012-10-21 ENCOUNTER — Encounter (HOSPITAL_COMMUNITY)
Admission: RE | Admit: 2012-10-21 | Discharge: 2012-10-21 | Disposition: A | Discharge status: Home / Self Care | Payer: Medicare Other | Source: Ambulatory Visit | Attending: Thoracic Surgery (Cardiothoracic Vascular Surgery) | Admitting: Thoracic Surgery (Cardiothoracic Vascular Surgery)

## 2012-10-21 ENCOUNTER — Encounter (HOSPITAL_COMMUNITY): Payer: Self-pay

## 2012-10-21 VITALS — BP 126/74 | HR 77 | Temp 98.5°F | Resp 20 | Ht 66.0 in | Wt 143.1 lb

## 2012-10-21 DIAGNOSIS — Z951 Presence of aortocoronary bypass graft: Secondary | ICD-10-CM | POA: Diagnosis not present

## 2012-10-21 DIAGNOSIS — Z8249 Family history of ischemic heart disease and other diseases of the circulatory system: Secondary | ICD-10-CM | POA: Diagnosis not present

## 2012-10-21 DIAGNOSIS — I209 Angina pectoris, unspecified: Secondary | ICD-10-CM | POA: Diagnosis present

## 2012-10-21 DIAGNOSIS — Z4682 Encounter for fitting and adjustment of non-vascular catheter: Secondary | ICD-10-CM | POA: Diagnosis not present

## 2012-10-21 DIAGNOSIS — Z48812 Encounter for surgical aftercare following surgery on the circulatory system: Secondary | ICD-10-CM | POA: Diagnosis not present

## 2012-10-21 DIAGNOSIS — E785 Hyperlipidemia, unspecified: Secondary | ICD-10-CM | POA: Diagnosis present

## 2012-10-21 DIAGNOSIS — M899 Disorder of bone, unspecified: Secondary | ICD-10-CM | POA: Diagnosis not present

## 2012-10-21 DIAGNOSIS — D696 Thrombocytopenia, unspecified: Secondary | ICD-10-CM | POA: Diagnosis not present

## 2012-10-21 DIAGNOSIS — I10 Essential (primary) hypertension: Secondary | ICD-10-CM | POA: Diagnosis not present

## 2012-10-21 DIAGNOSIS — I9589 Other hypotension: Secondary | ICD-10-CM | POA: Diagnosis not present

## 2012-10-21 DIAGNOSIS — J984 Other disorders of lung: Secondary | ICD-10-CM | POA: Diagnosis not present

## 2012-10-21 DIAGNOSIS — D62 Acute posthemorrhagic anemia: Secondary | ICD-10-CM | POA: Diagnosis not present

## 2012-10-21 DIAGNOSIS — I2510 Atherosclerotic heart disease of native coronary artery without angina pectoris: Secondary | ICD-10-CM | POA: Diagnosis not present

## 2012-10-21 DIAGNOSIS — J9819 Other pulmonary collapse: Secondary | ICD-10-CM | POA: Diagnosis not present

## 2012-10-21 DIAGNOSIS — R0789 Other chest pain: Secondary | ICD-10-CM | POA: Diagnosis not present

## 2012-10-21 DIAGNOSIS — E7800 Pure hypercholesterolemia, unspecified: Secondary | ICD-10-CM | POA: Diagnosis present

## 2012-10-21 DIAGNOSIS — J90 Pleural effusion, not elsewhere classified: Secondary | ICD-10-CM | POA: Diagnosis not present

## 2012-10-21 DIAGNOSIS — E8779 Other fluid overload: Secondary | ICD-10-CM | POA: Diagnosis not present

## 2012-10-21 DIAGNOSIS — I4891 Unspecified atrial fibrillation: Secondary | ICD-10-CM | POA: Diagnosis not present

## 2012-10-21 DIAGNOSIS — Z5189 Encounter for other specified aftercare: Secondary | ICD-10-CM | POA: Diagnosis not present

## 2012-10-21 DIAGNOSIS — E876 Hypokalemia: Secondary | ICD-10-CM | POA: Diagnosis not present

## 2012-10-21 DIAGNOSIS — R110 Nausea: Secondary | ICD-10-CM | POA: Diagnosis not present

## 2012-10-21 DIAGNOSIS — M949 Disorder of cartilage, unspecified: Secondary | ICD-10-CM | POA: Diagnosis not present

## 2012-10-21 LAB — CBC
HCT: 37.2 % (ref 36.0–46.0)
Hemoglobin: 13.2 g/dL (ref 12.0–15.0)
MCH: 31.3 pg (ref 26.0–34.0)
MCHC: 35.5 g/dL (ref 30.0–36.0)
MCV: 88.2 fL (ref 78.0–100.0)
Platelets: 245 10*3/uL (ref 150–400)
RBC: 4.22 MIL/uL (ref 3.87–5.11)
RDW: 12.2 % (ref 11.5–15.5)
WBC: 8.3 10*3/uL (ref 4.0–10.5)

## 2012-10-21 LAB — COMPREHENSIVE METABOLIC PANEL
ALT: 51 U/L — ABNORMAL HIGH (ref 0–35)
AST: 42 U/L — ABNORMAL HIGH (ref 0–37)
Albumin: 3.8 g/dL (ref 3.5–5.2)
Alkaline Phosphatase: 50 U/L (ref 39–117)
BUN: 19 mg/dL (ref 6–23)
CO2: 28 mEq/L (ref 19–32)
Calcium: 9.4 mg/dL (ref 8.4–10.5)
Chloride: 100 mEq/L (ref 96–112)
Creatinine, Ser: 0.81 mg/dL (ref 0.50–1.10)
GFR calc Af Amer: 84 mL/min — ABNORMAL LOW (ref >90)
GFR calc non Af Amer: 72 mL/min — ABNORMAL LOW (ref >90)
Glucose, Bld: 110 mg/dL — ABNORMAL HIGH (ref 70–99)
Potassium: 3.3 mEq/L — ABNORMAL LOW (ref 3.5–5.1)
Sodium: 139 mEq/L (ref 135–145)
Total Bilirubin: 0.2 mg/dL — ABNORMAL LOW (ref 0.3–1.2)
Total Protein: 7.6 g/dL (ref 6.0–8.3)

## 2012-10-21 LAB — APTT: aPTT: 33 seconds (ref 24–37)

## 2012-10-21 LAB — PROTIME-INR
INR: 1.02 (ref 0.00–1.49)
Prothrombin Time: 13.3 seconds (ref 11.6–15.2)

## 2012-10-21 MED ORDER — DEXTROSE 5 % IV SOLN
30.0000 ug/min | INTRAVENOUS | Status: AC
Start: 2012-10-22 — End: 2012-10-22
  Administered 2012-10-22: 25 ug/min via INTRAVENOUS
  Filled 2012-10-21: qty 2

## 2012-10-21 MED ORDER — MAGNESIUM SULFATE 50 % IJ SOLN
40.0000 meq | INTRAMUSCULAR | Status: DC
  Filled 2012-10-21: qty 10

## 2012-10-21 MED ORDER — EPINEPHRINE HCL 1 MG/ML IJ SOLN
0.5000 ug/min | INTRAVENOUS | Status: DC
  Filled 2012-10-21: qty 4

## 2012-10-21 MED ORDER — DEXTROSE 5 % IV SOLN
750.0000 mg | INTRAVENOUS | Status: DC
  Filled 2012-10-21 (×2): qty 750

## 2012-10-21 MED ORDER — SODIUM CHLORIDE 0.9 % IV SOLN
INTRAVENOUS | Status: AC
  Administered 2012-10-22: 69 mL/h via INTRAVENOUS
  Filled 2012-10-21 (×2): qty 40

## 2012-10-21 MED ORDER — VANCOMYCIN HCL 1000 MG IV SOLR
INTRAVENOUS | Status: AC
  Administered 2012-10-22: 09:00:00
  Filled 2012-10-21: qty 1000

## 2012-10-21 MED ORDER — SODIUM CHLORIDE 0.9 % IV SOLN
INTRAVENOUS | Status: AC
  Administered 2012-10-22: 1.8 [IU]/h via INTRAVENOUS
  Filled 2012-10-21: qty 1

## 2012-10-21 MED ORDER — DEXMEDETOMIDINE HCL IN NACL 400 MCG/100ML IV SOLN
0.1000 ug/kg/h | INTRAVENOUS | Status: AC
  Administered 2012-10-22: 0.2 ug/kg/h via INTRAVENOUS
  Filled 2012-10-21: qty 100

## 2012-10-21 MED ORDER — SODIUM CHLORIDE 0.9 % IV SOLN
INTRAVENOUS | Status: DC
  Filled 2012-10-21: qty 30

## 2012-10-21 MED ORDER — NITROGLYCERIN IN D5W 200-5 MCG/ML-% IV SOLN
2.0000 ug/min | INTRAVENOUS | Status: AC
  Administered 2012-10-22: 16 ug/min via INTRAVENOUS
  Filled 2012-10-21: qty 250

## 2012-10-21 MED ORDER — VANCOMYCIN HCL 10 G IV SOLR
1250.0000 mg | INTRAVENOUS | Status: DC
  Filled 2012-10-21: qty 1250

## 2012-10-21 MED ORDER — CHLORHEXIDINE GLUCONATE 4 % EX LIQD: 30.0000 mL | CUTANEOUS | Status: DC

## 2012-10-21 MED ORDER — PLASMA-LYTE 148 IV SOLN
INTRAVENOUS | Status: AC
  Administered 2012-10-22: 09:00:00
  Filled 2012-10-21: qty 2.5

## 2012-10-21 MED ORDER — DEXTROSE 5 % IV SOLN
1.5000 g | INTRAVENOUS | Status: DC
  Filled 2012-10-21: qty 1.5

## 2012-10-21 MED ORDER — POTASSIUM CHLORIDE 2 MEQ/ML IV SOLN
80.0000 meq | INTRAVENOUS | Status: DC
  Filled 2012-10-21: qty 40

## 2012-10-21 MED ORDER — METOPROLOL TARTRATE 12.5 MG HALF TABLET
12.5000 mg | ORAL_TABLET | Freq: Once | ORAL | Status: DC
  Filled 2012-10-21: qty 1

## 2012-10-21 MED ORDER — DOPAMINE-DEXTROSE 3.2-5 MG/ML-% IV SOLN
2.0000 ug/kg/min | INTRAVENOUS | Status: DC
  Filled 2012-10-21: qty 250

## 2012-10-21 NOTE — H&P (Signed)
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 The patient states that approximately 11 months ago she first began to experience intermittent episodes of substernal chest discomfort radiating down her arms that occurs with physical activity. Over the past summer these symptoms were somewhat sporadic, but over the last few months they have accelerated considerably. Since Christmas the patient has had 3 separate episodes of prolonged substernal chest discomfort lasting 2-3 hours, most recently one that occurred just prior to presentation with Dr. Katrinka Blazing. The patient was admitted to the hospital for diagnostic cardiac catheterization which revealed severe three-vessel coronary artery disease with preserved left ventricular function. During the procedure the patient also was noted to develop paroxysmal atrial fibrillation which terminated spontaneously.  Cardiothoracic surgical consultation was requested, and I had the opportunity to see the patient in consultation on 10/17/2012 while she was in the hospital.  She remained very stable post-catheterization with no further episodes of atrial fibrillation, and she was discharged with plans to return for surgery on 10/22/2012.  The patient reports that prior to 11 months ago she had remained physically quite active for her age. She enjoys walking and exercising on a regular basis. However, over the past 11 months she has had to cut her activity level considerably. She gets tired quite easily. She has had accelerating symptoms of chest discomfort with activity, and recently  symptoms occur with relatively mild activity. She denies nocturnal chest pain. She denies any history of resting shortness of breath, PND, orthopnea, or lower extremity edema. She has had occasional palpitations without dizzy spells or syncope.   Past Medical History  Diagnosis Date  . Hypertension     Dr.Henry Katrinka Blazing  . Hyperlipidemia   . Osteopenia 9/09  . Adenomatous colon polyp 10/09  . Chest pain on exertion     "escalating over last few months" (10/16/2012)  . Coronary artery disease   . PAF (paroxysmal atrial fibrillation)     during cath/notes 10/16/2012    Past Surgical History  Procedure Laterality Date  . Total abdominal hysterectomy w/ bilateral salpingoophorectomy  1990    benign growth  . Cardiac catheterization  10/16/2012  . Tonsillectomy  1949  . Abdominal hysterectomy    . Breast cyst excision Left 1980-1990's    x 3    Family History  Problem Relation Age of Onset  . Hypertension Mother   . Heart attack Mother   . Heart disease Mother   . Hypertension Brother   . Cancer Brother     prostate  . Diabetes Neg Hx   . Breast cancer Neg Hx   . Colon cancer Neg Hx     Social History History  Substance Use Topics  . Smoking status: Never Smoker   . Smokeless tobacco: Never Used  . Alcohol Use: 0.6 oz/week    1 Glasses of wine per week     Comment: 1 glass of wine weekly (when playing cards)    Prior to Admission medications   Medication Sig Start Date End Date Taking? Authorizing Provider  Acetylcarnitine HCl (ACETYL L-CARNITINE) 250 MG CAPS Take 1  capsule by mouth daily.    Historical Provider, MD  amiodarone (PACERONE) 200 MG tablet Take 1 tablet (200 mg total) by mouth 2 (two) times daily. 10/18/12   Lyn Records III, MD  amLODipine (NORVASC) 5 MG tablet Take 5 mg by mouth daily.    Historical Provider, MD  aspirin 325 MG EC tablet Take 1 tablet (325 mg total) by mouth daily. 10/18/12   Lyn Records III, MD  atenolol (TENORMIN) 100 MG tablet Take 100  mg by mouth daily.    Historical Provider, MD  atorvastatin (LIPITOR) 10 MG tablet TAKE 1 TABLET (10 MG TOTAL) BY MOUTH DAILY. 10/19/12   Joselyn Arrow, MD  benazepril (LOTENSIN) 40 MG tablet Take 40 mg by mouth daily.    Historical Provider, MD  cholecalciferol (VITAMIN D) 1000 UNITS tablet Take 1,000 Units by mouth daily.    Historical Provider, MD  Coenzyme Q10 (CO Q 10) 100 MG CAPS Take 1 capsule by mouth daily.    Historical Provider, MD  [facility] BETA CAROTENE PO Take 1 tablet by mouth daily.    Historical Provider, MD  Glucosamine-Chondroitin-MSM 500-400-300 MG TABS Take 2 tablets by mouth daily.    Historical Provider, MD  hydrochlorothiazide (HYDRODIURIL) 25 MG tablet Take 25 mg by mouth daily.    Historical Provider, MD  Multiple Vitamins-Minerals (MULTIVITAMIN WITH MINERALS) tablet Take 1 tablet by mouth daily.    Historical Provider, MD  multivitamin-lutein [facility]) CAPS Take 1 capsule by mouth daily.    Historical Provider, MD  mupirocin ointment (BACTROBAN) 2 % Apply 1 application topically 2 (two) times daily. 10/17/12   Lyn Records III, MD  nitroGLYCERIN (NITRODUR - DOSED IN MG/24 HR) 0.2 mg/hr Place 1 patch (0.2 mg total) onto the skin daily. 10/18/12   Lyn Records III, MD  nitroGLYCERIN (NITROSTAT) 0.4 MG SL tablet Place 1 tablet (0.4 mg total) under the tongue every 5 (five) minutes as needed for chest pain. 10/18/12   Lyn Records III, MD  Nutritional Supplements (ESTROVEN MAXIMUM STRENGTH) TABS Take 1 tablet by mouth daily.    Historical Provider, MD  Zinc 25 MG TABS Take 1 tablet by mouth daily.    Historical Provider, MD    No Known Allergies   Review of Systems:              General:                      normal appetite, decrease energy, no weight gain, no weight loss, no fever             Cardiac:                      + chest pain with exertion, + occasional chest pain at rest, + SOB with moderate exertion, no resting SOB, no PND, no orthopnea, + palpitations, +  arrhythmia, + atrial fibrillation, no LE edema, no dizzy spells, no syncope             Respiratory:                no shortness of breath, no home oxygen, no productive cough, + dry cough, no bronchitis, no wheezing, no hemoptysis, no asthma, no pain with inspiration or cough, no sleep apnea, no CPAP at night             GI:  no difficulty swallowing, occasional reflux, no frequent heartburn, no hiatal hernia, no abdominal pain, no constipation, no diarrhea, no hematochezia, no hematemesis, no melena             GU:                              no dysuria,  no frequency, no urinary tract infection, no hematuria, no kidney stones, no kidney disease             Vascular:                     no pain suggestive of claudication, no pain in feet, no leg cramps, no varicose veins, no DVT, no non-healing foot ulcer             Neuro:                         no stroke, no TIA's, no seizures, no headaches, no temporary blindness one eye,  no slurred speech, no peripheral neuropathy, no chronic pain, no instability of gait, no memory/cognitive dysfunction             Musculoskeletal:         mild arthritis primarily in hips, no joint swelling, no myalgias, no difficulty walking, normal mobility               Skin:                            no rash, no itching, no skin infections, no pressure sores or ulcerations             Psych:                         no anxiety, no depression, no nervousness, no unusual recent stress             Eyes:                           no blurry vision, no floaters, no recent vision changes, + wears glasses or contacts             ENT:                            no hearing loss, no loose or painful teeth, no dentures             Hematologic:               no easy bruising, no abnormal bleeding, no clotting disorder, no frequent epistaxis             Endocrine:                   no diabetes, does not check CBG's at home                             Physical Exam:              BP 162/83  Pulse 77  Temp(Src) 98.1 F (36.7 C) (Oral)  Resp 18  Ht 5\' 6"  (1.676 m)  Wt 68 kg (149 lb 14.6 oz)  BMI 24.21 kg/m2  SpO2  100%             General:                        well-appearing             HEENT:                       Unremarkable               Neck:                           no JVD, no bruits, no adenopathy               Chest:                         clear to auscultation, symmetrical breath sounds, no wheezes, no rhonchi               CV:                              RRR, no murmur               Abdomen:                    soft, non-tender, no masses               Extremities:                 warm, well-perfused, pulses palpable             Rectal/GU                   Deferred             Neuro:                         Grossly non-focal and symmetrical throughout             Skin:                            Clean and dry, no rashes, no breakdown  Diagnostic Tests:   Diagnostic Cardiac Catheterization Report   Magdelena Kinsella   72 y.o.   female   07/31/1940   Procedure Date: 10/16/2012   Referring Physician: Joselyn Arrow, M.D.   Primary Cardiologist:: HWB Leia Alf, M.D.   PROCEDURE: Left heart catheterization with selective coronary angiography, left ventriculogram.   INDICATIONS: Class III-IV angina with early positive exercise treadmill test for angina and mild apical ischemia on nuclear perfusion 10/15/12   The risks, benefits, and details of the procedure were explained to the patient. The patient verbalized understanding and wanted to proceed. Informed written consent was obtained.   PROCEDURE TECHNIQUE: After Xylocaine anesthesia a 6 French glide sheath slender was placed in the right radial artery with a single anterior needle wall stick. Coronary angiography was done using a 5 [place] A2 MP catheter. Left ventriculography was done using a 5 [place] A2 MP catheter.   CONTRAST: Total of 80 cc.   COMPLICATIONS: None.    HEMODYNAMICS: Aortic pressure was 112/65 mmHg; LV pressure was 113/3 mmHg; LVEDP 6 mm mercury. There was no gradient between the  left ventricle and aorta.   ANGIOGRAPHIC DATA: The left main coronary artery is widely patent. Moderate calcification is noted..   The left anterior descending artery is diffusely diseased starting just after the first diagonal proximally and extending into the mid vessel. There is focal 85-90% stenosis just beyond the second diagonal within this diffusely diseased region. The entire segment is moderately to severely calcified..   The left circumflex artery is 90% mid stenosis with segmental extension into a region of 50% narrowing. Moderate to severe calcification is noted.   The right coronary artery is highly diseased in the proximal, mid, and distal vessel. There is 50% proximal narrowing after the conus branch the mid vessel contains 90% stenosis. The mid vessel beyond the 90% stenosis is diffusely diseased with up to 80% narrowing. The distal RCA before 3 left ventricular branches, 2 of which are small contains 99% stenosis with TIMI grade 2 flow. I suspect the distal right coronary is the culprit for the patient's rest pain.   LEFT VENTRICULOGRAM: Left ventricular angiogram was done in the 30 RAO projection and revealed normal systolic function with mild mid inferior wall hypokinesis. The estimated ejection fraction of 55%.   IMPRESSIONS: 1. Class IV angina due to severe three-vessel coronary disease as noted below.  2. Moderate three-vessel coronary calcification   3. Diffusely diseased proximal to mid LAD with up to 85% stenosis in the mid vessel.   4. 80-90% proximal to mid circumflex   5. Multifocal severe right coronary disease with mid 90% and 99% distal stenosis.   6. Overall normal left ventricular function, EF 60%   7. Paroxysmal atrial fibrillation, identified for the first time in the cath lab   RECOMMENDATION: 1. Placed in the hospital and start  anticoagulation therapy and long-acting nitrates   2. TCTS consultation for recommendations concerning CABG.   3. Management of atrial fibrillation to include antithrombotic therapy . May be a candidate for Maze procedure during CABG     Impression:  The patient presents with a crescendo pattern of unstable angina and has severe three-vessel coronary artery disease with preserved left ventricular function. Coronary anatomy is relatively unfavorable for percutaneous coronary intervention.  The patient also describes history of occasional palpitations and was found to have an episode of paroxysmal atrial fibrillation during cardiac catheterization. I agree that her coronary artery disease would best be treated with surgical revascularization. She may also benefit from concomitant Maze procedure to decrease risk of recurrent paroxysmal atrial fibrillation in the long-term future.    Plan:  I've reviewed the indications, risks, and potential benefits of surgery with the patient and 2 of her family members. Alternative treatment strategies were discussed in detail. The patient understands and accepts all potential associated risks of surgery including but not limited to risk of death, stroke, myocardial infarction, congestive heart failure, respiratory failure, renal failure, bleeding requiring blood transfusion, arrhythmia, infection, and late recurrence of coronary artery disease. All of their questions been addressed. We will plan to begin the patient on amiodarone preoperatively. We tentatively plan to proceed with surgery on Wednesday, April 30.       Salvatore Decent. Cornelius Moras, MD

## 2012-10-21 NOTE — Pre-Procedure Instructions (Signed)
Madelina Sanda  10/21/2012   Your procedure is scheduled on:  10/22/12  Report to [facility] at 630 AM.  Call this number if you have problems the morning of surgery: [phone]   Remember:   Do not eat food or drink liquids after midnight.   Take these medicines the morning of surgery with A SIP OF WATER: amlodipine,atenolol,estrostat   Do not wear jewelry, make-up or nail polish.  Do not wear lotions, powders, or perfumes. You may wear deodorant.  Do not shave 48 hours prior to surgery. Men may shave face and neck.  Do not bring valuables to the hospital.  Contacts, dentures or bridgework may not be worn into surgery.  Leave suitcase in the car. After surgery it may be brought to your room.  For patients admitted to the hospital, checkout time is 11:00 AM the day of  discharge.   Patients discharged the day of surgery will not be allowed to drive  home.  Name and phone number of your driver: family  Special Instructions: Shower using CHG 2 nights before surgery and the night before surgery.  If you shower the day of surgery use CHG.  Use special wash - you have one bottle of CHG for all showers.  You should use approximately 1/3 of the bottle for each shower.   Please read over the following fact sheets that you were given: Pain Booklet, Coughing and Deep Breathing, Blood Transfusion Information, Open Heart Packet, Surgical Site Infection Prevention and Anesthesia Post-op Instructions

## 2012-10-22 ENCOUNTER — Inpatient Hospital Stay (HOSPITAL_COMMUNITY)
Admission: RE | Admit: 2012-10-22 | Discharge: 2012-10-29 | DRG: 236 | Disposition: A | Discharge status: Home / Self Care | Payer: Medicare Other | Source: Ambulatory Visit | Attending: Thoracic Surgery (Cardiothoracic Vascular Surgery) | Admitting: Thoracic Surgery (Cardiothoracic Vascular Surgery)

## 2012-10-22 ENCOUNTER — Inpatient Hospital Stay (HOSPITAL_COMMUNITY): Payer: Medicare Other

## 2012-10-22 ENCOUNTER — Inpatient Hospital Stay (HOSPITAL_COMMUNITY): Payer: Medicare Other | Admitting: Anesthesiology

## 2012-10-22 ENCOUNTER — Encounter (HOSPITAL_COMMUNITY): Payer: Self-pay | Admitting: *Deleted

## 2012-10-22 ENCOUNTER — Encounter (HOSPITAL_COMMUNITY): Payer: Self-pay | Admitting: Anesthesiology

## 2012-10-22 ENCOUNTER — Encounter (HOSPITAL_COMMUNITY)
Admission: RE | Disposition: A | Discharge status: Home / Self Care | Payer: Self-pay | Source: Ambulatory Visit | Attending: Thoracic Surgery (Cardiothoracic Vascular Surgery)

## 2012-10-22 DIAGNOSIS — L89159 Pressure ulcer of sacral region, unspecified stage: Secondary | ICD-10-CM

## 2012-10-22 DIAGNOSIS — Z8679 Personal history of other diseases of the circulatory system: Secondary | ICD-10-CM

## 2012-10-22 DIAGNOSIS — J9819 Other pulmonary collapse: Secondary | ICD-10-CM | POA: Diagnosis not present

## 2012-10-22 DIAGNOSIS — T462X5A Adverse effect of other antidysrhythmic drugs, initial encounter: Secondary | ICD-10-CM | POA: Diagnosis not present

## 2012-10-22 DIAGNOSIS — I9589 Other hypotension: Secondary | ICD-10-CM | POA: Diagnosis not present

## 2012-10-22 DIAGNOSIS — D62 Acute posthemorrhagic anemia: Secondary | ICD-10-CM | POA: Diagnosis not present

## 2012-10-22 DIAGNOSIS — Z8249 Family history of ischemic heart disease and other diseases of the circulatory system: Secondary | ICD-10-CM

## 2012-10-22 DIAGNOSIS — I209 Angina pectoris, unspecified: Secondary | ICD-10-CM | POA: Diagnosis present

## 2012-10-22 DIAGNOSIS — Z951 Presence of aortocoronary bypass graft: Secondary | ICD-10-CM

## 2012-10-22 DIAGNOSIS — I4891 Unspecified atrial fibrillation: Secondary | ICD-10-CM

## 2012-10-22 DIAGNOSIS — E7800 Pure hypercholesterolemia, unspecified: Secondary | ICD-10-CM | POA: Diagnosis present

## 2012-10-22 DIAGNOSIS — I2510 Atherosclerotic heart disease of native coronary artery without angina pectoris: Principal | ICD-10-CM | POA: Diagnosis present

## 2012-10-22 DIAGNOSIS — I10 Essential (primary) hypertension: Secondary | ICD-10-CM | POA: Diagnosis present

## 2012-10-22 DIAGNOSIS — E785 Hyperlipidemia, unspecified: Secondary | ICD-10-CM | POA: Diagnosis present

## 2012-10-22 DIAGNOSIS — E8779 Other fluid overload: Secondary | ICD-10-CM | POA: Diagnosis not present

## 2012-10-22 DIAGNOSIS — R110 Nausea: Secondary | ICD-10-CM | POA: Diagnosis not present

## 2012-10-22 DIAGNOSIS — E876 Hypokalemia: Secondary | ICD-10-CM | POA: Diagnosis not present

## 2012-10-22 DIAGNOSIS — Y9210 Unspecified residential institution as the place of occurrence of the external cause: Secondary | ICD-10-CM | POA: Diagnosis not present

## 2012-10-22 DIAGNOSIS — Z9889 Other specified postprocedural states: Secondary | ICD-10-CM

## 2012-10-22 DIAGNOSIS — D696 Thrombocytopenia, unspecified: Secondary | ICD-10-CM | POA: Diagnosis not present

## 2012-10-22 HISTORY — DX: Pressure ulcer of sacral region, unspecified stage: L89.159

## 2012-10-22 HISTORY — DX: Personal history of other diseases of the circulatory system: Z86.79

## 2012-10-22 HISTORY — DX: Other specified postprocedural states: Z98.890

## 2012-10-22 HISTORY — DX: Personal history of other diseases of the circulatory system: Z98.890

## 2012-10-22 HISTORY — PX: MAZE: SHX5063

## 2012-10-22 HISTORY — PX: CORONARY ARTERY BYPASS GRAFT: SHX141

## 2012-10-22 HISTORY — PX: INTRAOPERATIVE TRANSESOPHAGEAL ECHOCARDIOGRAM: SHX5062

## 2012-10-22 HISTORY — DX: Presence of aortocoronary bypass graft: Z95.1

## 2012-10-22 LAB — CBC
HCT: 18.7 % — ABNORMAL LOW (ref 36.0–46.0)
Hemoglobin: 6.7 g/dL — CL (ref 12.0–15.0)
MCH: 31.5 pg (ref 26.0–34.0)
MCHC: 35.8 g/dL (ref 30.0–36.0)
MCV: 87.8 fL (ref 78.0–100.0)
Platelets: 120 10*3/uL — ABNORMAL LOW (ref 150–400)
RBC: 2.13 MIL/uL — ABNORMAL LOW (ref 3.87–5.11)
RDW: 12.5 % (ref 11.5–15.5)
WBC: 15.9 10*3/uL — ABNORMAL HIGH (ref 4.0–10.5)

## 2012-10-22 LAB — POCT I-STAT 4, (NA,K, GLUC, HGB,HCT)
Glucose, Bld: 119 mg/dL — ABNORMAL HIGH (ref 70–99)
Glucose, Bld: 112 mg/dL — ABNORMAL HIGH (ref 70–99)
Glucose, Bld: 86 mg/dL (ref 70–99)
Glucose, Bld: 135 mg/dL — ABNORMAL HIGH (ref 70–99)
Glucose, Bld: 137 mg/dL — ABNORMAL HIGH (ref 70–99)
Glucose, Bld: 137 mg/dL — ABNORMAL HIGH (ref 70–99)
Glucose, Bld: 104 mg/dL — ABNORMAL HIGH (ref 70–99)
HCT: 32 % — ABNORMAL LOW (ref 36.0–46.0)
HCT: 22 % — ABNORMAL LOW (ref 36.0–46.0)
HCT: 31 % — ABNORMAL LOW (ref 36.0–46.0)
HCT: 26 % — ABNORMAL LOW (ref 36.0–46.0)
HCT: 22 % — ABNORMAL LOW (ref 36.0–46.0)
HCT: 18 % — ABNORMAL LOW (ref 36.0–46.0)
HCT: 30 % — ABNORMAL LOW (ref 36.0–46.0)
Hemoglobin: 10.9 g/dL — ABNORMAL LOW (ref 12.0–15.0)
Hemoglobin: 10.5 g/dL — ABNORMAL LOW (ref 12.0–15.0)
Hemoglobin: 7.5 g/dL — ABNORMAL LOW (ref 12.0–15.0)
Hemoglobin: 8.8 g/dL — ABNORMAL LOW (ref 12.0–15.0)
Hemoglobin: 7.5 g/dL — ABNORMAL LOW (ref 12.0–15.0)
Hemoglobin: 6.1 g/dL — CL (ref 12.0–15.0)
Hemoglobin: 10.2 g/dL — ABNORMAL LOW (ref 12.0–15.0)
Potassium: 3.7 mEq/L (ref 3.5–5.1)
Potassium: 3.4 mEq/L — ABNORMAL LOW (ref 3.5–5.1)
Potassium: 3.6 mEq/L (ref 3.5–5.1)
Potassium: 4.2 mEq/L (ref 3.5–5.1)
Potassium: 4.2 mEq/L (ref 3.5–5.1)
Potassium: 3.7 mEq/L (ref 3.5–5.1)
Potassium: 3.8 mEq/L (ref 3.5–5.1)
Sodium: 138 mEq/L (ref 135–145)
Sodium: 141 mEq/L (ref 135–145)
Sodium: 141 mEq/L (ref 135–145)
Sodium: 141 mEq/L (ref 135–145)
Sodium: 139 mEq/L (ref 135–145)
Sodium: 135 mEq/L (ref 135–145)
Sodium: 139 mEq/L (ref 135–145)

## 2012-10-22 LAB — CREATININE, SERUM
Creatinine, Ser: 0.65 mg/dL (ref 0.50–1.10)
GFR calc Af Amer: >90 mL/min (ref >90)
GFR calc non Af Amer: 89 mL/min — ABNORMAL LOW (ref >90)

## 2012-10-22 LAB — POCT I-STAT 3, ART BLOOD GAS (G3+)
Acid-base deficit: 4 mmol/L — ABNORMAL HIGH (ref 0.0–2.0)
Acid-base deficit: 4 mmol/L — ABNORMAL HIGH (ref 0.0–2.0)
Acid-base deficit: 4 mmol/L — ABNORMAL HIGH (ref 0.0–2.0)
Bicarbonate: 21.7 mEq/L (ref 20.0–24.0)
Bicarbonate: 24.3 mEq/L — ABNORMAL HIGH (ref 20.0–24.0)
Bicarbonate: 21.2 mEq/L (ref 20.0–24.0)
Bicarbonate: 21.6 mEq/L (ref 20.0–24.0)
O2 Saturation: 100 %
O2 Saturation: 100 %
O2 Saturation: 100 %
O2 Saturation: 99 %
Patient temperature: 35.7
TCO2: 23 mmol/L (ref 0–100)
TCO2: 25 mmol/L (ref 0–100)
TCO2: 22 mmol/L (ref 0–100)
TCO2: 23 mmol/L (ref 0–100)
pCO2 arterial: 39.1 mmHg (ref 35.0–45.0)
pCO2 arterial: 38.7 mmHg (ref 35.0–45.0)
pCO2 arterial: 35.1 mmHg (ref 35.0–45.0)
pCO2 arterial: 36.7 mmHg (ref 35.0–45.0)
pH, Arterial: 7.352 (ref 7.350–7.450)
pH, Arterial: 7.406 (ref 7.350–7.450)
pH, Arterial: 7.388 (ref 7.350–7.450)
pH, Arterial: 7.372 (ref 7.350–7.450)
pO2, Arterial: 500 mmHg — ABNORMAL HIGH (ref 80.0–100.0)
pO2, Arterial: 357 mmHg — ABNORMAL HIGH (ref 80.0–100.0)
pO2, Arterial: 177 mmHg — ABNORMAL HIGH (ref 80.0–100.0)
pO2, Arterial: 117 mmHg — ABNORMAL HIGH (ref 80.0–100.0)

## 2012-10-22 LAB — MAGNESIUM: Magnesium: 3.3 mg/dL — ABNORMAL HIGH (ref 1.5–2.5)

## 2012-10-22 LAB — POCT I-STAT, CHEM 8
BUN: 14 mg/dL (ref 6–23)
Calcium, Ion: 1.08 mmol/L — ABNORMAL LOW (ref 1.13–1.30)
Chloride: 109 mEq/L (ref 96–112)
Creatinine, Ser: 0.7 mg/dL (ref 0.50–1.10)
Glucose, Bld: 146 mg/dL — ABNORMAL HIGH (ref 70–99)
HCT: 20 % — ABNORMAL LOW (ref 36.0–46.0)
Hemoglobin: 6.8 g/dL — CL (ref 12.0–15.0)
Potassium: 4 mEq/L (ref 3.5–5.1)
Sodium: 145 mEq/L (ref 135–145)
TCO2: 24 mmol/L (ref 0–100)

## 2012-10-22 LAB — PLATELET COUNT: Platelets: 131 10*3/uL — ABNORMAL LOW (ref 150–400)

## 2012-10-22 LAB — PROTIME-INR
INR: 1.61 — ABNORMAL HIGH (ref 0.00–1.49)
Prothrombin Time: 18.6 seconds — ABNORMAL HIGH (ref 11.6–15.2)

## 2012-10-22 LAB — APTT: aPTT: 39 seconds — ABNORMAL HIGH (ref 24–37)

## 2012-10-22 LAB — HEMOGLOBIN AND HEMATOCRIT, BLOOD
HCT: 21.5 % — ABNORMAL LOW (ref 36.0–46.0)
Hemoglobin: 7.8 g/dL — ABNORMAL LOW (ref 12.0–15.0)

## 2012-10-22 SURGERY — CORONARY ARTERY BYPASS GRAFTING (CABG)
Anesthesia: General | Site: Chest | Wound class: Clean

## 2012-10-22 MED ORDER — ALBUMIN HUMAN 5 % IV SOLN
INTRAVENOUS | Status: AC
  Filled 2012-10-22: qty 500

## 2012-10-22 MED ORDER — LACTATED RINGERS IV SOLN: 500.0000 mL | Freq: Once | INTRAVENOUS | Status: AC | PRN

## 2012-10-22 MED ORDER — METOPROLOL TARTRATE 25 MG/10 ML ORAL SUSPENSION
12.5000 mg | Freq: Two times a day (BID) | ORAL | Status: DC
  Filled 2012-10-22 (×3): qty 5

## 2012-10-22 MED ORDER — ROCURONIUM BROMIDE 100 MG/10ML IV SOLN
INTRAVENOUS | Status: DC | PRN
  Administered 2012-10-22 (×4): 50 mg via INTRAVENOUS

## 2012-10-22 MED ORDER — NITROGLYCERIN IN D5W 200-5 MCG/ML-% IV SOLN: 0.0000 ug/min | INTRAVENOUS | Status: DC

## 2012-10-22 MED ORDER — MIDAZOLAM HCL 5 MG/5ML IJ SOLN
INTRAMUSCULAR | Status: DC | PRN
  Administered 2012-10-22: 4 mg via INTRAVENOUS
  Administered 2012-10-22: 1 mg via INTRAVENOUS
  Administered 2012-10-22: 5 mg via INTRAVENOUS
  Administered 2012-10-22 (×2): 2 mg via INTRAVENOUS

## 2012-10-22 MED ORDER — VANCOMYCIN HCL 1000 MG IV SOLR: 1000.0000 mg | INTRAVENOUS | Status: DC | PRN

## 2012-10-22 MED ORDER — SODIUM CHLORIDE 0.9 % IV SOLN: 250.0000 mL | INTRAVENOUS | Status: DC

## 2012-10-22 MED ORDER — LIDOCAINE HCL 4 % MT SOLN
OROMUCOSAL | Status: DC | PRN
  Administered 2012-10-22: 4 mL via TOPICAL

## 2012-10-22 MED ORDER — BISACODYL 5 MG PO TBEC
10.0000 mg | DELAYED_RELEASE_TABLET | Freq: Every day | ORAL | Status: DC
  Administered 2012-10-23 – 2012-10-29 (×5): 10 mg via ORAL
  Filled 2012-10-22 (×5): qty 2

## 2012-10-22 MED ORDER — ALBUMIN HUMAN 5 % IV SOLN
250.0000 mL | INTRAVENOUS | Status: AC | PRN
  Administered 2012-10-22 (×4): 250 mL via INTRAVENOUS
  Filled 2012-10-22 (×2): qty 250

## 2012-10-22 MED ORDER — LACTATED RINGERS IV SOLN
INTRAVENOUS | Status: DC | PRN
  Administered 2012-10-22 (×2): via INTRAVENOUS

## 2012-10-22 MED ORDER — ONDANSETRON HCL 4 MG/2ML IJ SOLN
4.0000 mg | Freq: Four times a day (QID) | INTRAMUSCULAR | Status: DC | PRN
  Administered 2012-10-22 – 2012-10-26 (×5): 4 mg via INTRAVENOUS
  Filled 2012-10-22 (×5): qty 2

## 2012-10-22 MED ORDER — ALBUMIN HUMAN 5 % IV SOLN: 250.0000 mL | INTRAVENOUS | Status: AC | PRN

## 2012-10-22 MED ORDER — ASPIRIN EC 325 MG PO TBEC
325.0000 mg | DELAYED_RELEASE_TABLET | Freq: Every day | ORAL | Status: DC
  Administered 2012-10-23: 325 mg via ORAL
  Filled 2012-10-22 (×2): qty 1

## 2012-10-22 MED ORDER — MORPHINE SULFATE 2 MG/ML IJ SOLN
2.0000 mg | INTRAMUSCULAR | Status: DC | PRN
  Administered 2012-10-22: 2 mg via INTRAVENOUS
  Administered 2012-10-22 – 2012-10-23 (×4): 4 mg via INTRAVENOUS
  Filled 2012-10-22: qty 1
  Filled 2012-10-22 (×2): qty 2

## 2012-10-22 MED ORDER — DEXTROSE 5 % IV SOLN
1.5000 g | Freq: Two times a day (BID) | INTRAVENOUS | Status: AC
  Administered 2012-10-22 – 2012-10-24 (×4): 1.5 g via INTRAVENOUS
  Filled 2012-10-22 (×4): qty 1.5

## 2012-10-22 MED ORDER — OXYCODONE HCL 5 MG PO TABS
5.0000 mg | ORAL_TABLET | ORAL | Status: DC | PRN
  Administered 2012-10-23 – 2012-10-24 (×6): 10 mg via ORAL
  Filled 2012-10-22 (×2): qty 2
  Filled 2012-10-22: qty 1
  Filled 2012-10-22 (×4): qty 2

## 2012-10-22 MED ORDER — PANTOPRAZOLE SODIUM 40 MG PO TBEC
40.0000 mg | DELAYED_RELEASE_TABLET | Freq: Every day | ORAL | Status: DC
  Administered 2012-10-24 – 2012-10-29 (×6): 40 mg via ORAL
  Filled 2012-10-22 (×7): qty 1

## 2012-10-22 MED ORDER — SODIUM CHLORIDE 0.9 % IV SOLN: INTRAVENOUS | Status: DC

## 2012-10-22 MED ORDER — VANCOMYCIN HCL 1000 MG IV SOLR
1000.0000 mg | INTRAVENOUS | Status: DC | PRN
  Administered 2012-10-22: 1000 mg via INTRAVENOUS

## 2012-10-22 MED ORDER — INSULIN REGULAR BOLUS VIA INFUSION
0.0000 [IU] | Freq: Three times a day (TID) | INTRAVENOUS | Status: DC
  Filled 2012-10-22: qty 10

## 2012-10-22 MED ORDER — PROTAMINE SULFATE 10 MG/ML IV SOLN
INTRAVENOUS | Status: DC | PRN
  Administered 2012-10-22: 10 mg via INTRAVENOUS
  Administered 2012-10-22: 140 mg via INTRAVENOUS

## 2012-10-22 MED ORDER — ACETAMINOPHEN 10 MG/ML IV SOLN
1000.0000 mg | Freq: Once | INTRAVENOUS | Status: AC
  Administered 2012-10-22: 1000 mg via INTRAVENOUS
  Filled 2012-10-22: qty 100

## 2012-10-22 MED ORDER — MORPHINE SULFATE 2 MG/ML IJ SOLN
1.0000 mg | INTRAMUSCULAR | Status: AC | PRN
  Administered 2012-10-22 – 2012-10-23 (×2): 4 mg via INTRAVENOUS
  Filled 2012-10-22 (×2): qty 2

## 2012-10-22 MED ORDER — ALBUMIN HUMAN 5 % IV SOLN
INTRAVENOUS | Status: DC | PRN
  Administered 2012-10-22 (×2): via INTRAVENOUS

## 2012-10-22 MED ORDER — SODIUM CHLORIDE 0.9 % IJ SOLN: 3.0000 mL | INTRAMUSCULAR | Status: DC | PRN

## 2012-10-22 MED ORDER — METOPROLOL TARTRATE 12.5 MG HALF TABLET
12.5000 mg | ORAL_TABLET | Freq: Two times a day (BID) | ORAL | Status: DC
  Filled 2012-10-22 (×3): qty 1

## 2012-10-22 MED ORDER — PHENYLEPHRINE HCL 10 MG/ML IJ SOLN
0.0000 ug/min | INTRAVENOUS | Status: DC
  Filled 2012-10-22: qty 2

## 2012-10-22 MED ORDER — FENTANYL CITRATE 0.05 MG/ML IJ SOLN
INTRAMUSCULAR | Status: DC | PRN
  Administered 2012-10-22: 100 ug via INTRAVENOUS
  Administered 2012-10-22: 250 ug via INTRAVENOUS
  Administered 2012-10-22: 50 ug via INTRAVENOUS
  Administered 2012-10-22: 250 ug via INTRAVENOUS
  Administered 2012-10-22: 150 ug via INTRAVENOUS
  Administered 2012-10-22: 50 ug via INTRAVENOUS
  Administered 2012-10-22: 100 ug via INTRAVENOUS
  Administered 2012-10-22: 150 ug via INTRAVENOUS
  Administered 2012-10-22 (×3): 250 ug via INTRAVENOUS
  Administered 2012-10-22: 100 ug via INTRAVENOUS
  Administered 2012-10-22: 250 ug via INTRAVENOUS
  Administered 2012-10-22: 50 ug via INTRAVENOUS

## 2012-10-22 MED ORDER — LACTATED RINGERS IV SOLN: INTRAVENOUS | Status: DC

## 2012-10-22 MED ORDER — ARTIFICIAL TEARS OP OINT
TOPICAL_OINTMENT | OPHTHALMIC | Status: DC | PRN
  Administered 2012-10-22: 1 via OPHTHALMIC

## 2012-10-22 MED ORDER — MIDAZOLAM HCL 2 MG/2ML IJ SOLN
2.0000 mg | INTRAMUSCULAR | Status: DC | PRN
  Administered 2012-10-22 (×3): 2 mg via INTRAVENOUS
  Filled 2012-10-22 (×3): qty 2

## 2012-10-22 MED ORDER — MAGNESIUM SULFATE 40 MG/ML IJ SOLN
4.0000 g | Freq: Once | INTRAMUSCULAR | Status: AC
  Administered 2012-10-22: 4 g via INTRAVENOUS
  Filled 2012-10-22: qty 100

## 2012-10-22 MED ORDER — SODIUM CHLORIDE 0.9 % IJ SOLN
OROMUCOSAL | Status: DC | PRN
  Administered 2012-10-22 (×2): via TOPICAL

## 2012-10-22 MED ORDER — LACTATED RINGERS IV SOLN
INTRAVENOUS | Status: DC | PRN
  Administered 2012-10-22: 08:00:00 via INTRAVENOUS

## 2012-10-22 MED ORDER — INSULIN ASPART 100 UNIT/ML ~~LOC~~ SOLN
0.0000 [IU] | SUBCUTANEOUS | Status: AC
  Administered 2012-10-22 – 2012-10-23 (×3): 2 [IU] via SUBCUTANEOUS

## 2012-10-22 MED ORDER — VANCOMYCIN HCL IN DEXTROSE 1-5 GM/200ML-% IV SOLN
1000.0000 mg | Freq: Once | INTRAVENOUS | Status: AC
  Administered 2012-10-23: 1000 mg via INTRAVENOUS
  Filled 2012-10-22: qty 200

## 2012-10-22 MED ORDER — DEXMEDETOMIDINE HCL IN NACL 200 MCG/50ML IV SOLN
0.1000 ug/kg/h | INTRAVENOUS | Status: DC
  Administered 2012-10-22: 0.5 ug/kg/h via INTRAVENOUS
  Administered 2012-10-22: 0.2 ug/kg/h via INTRAVENOUS
  Filled 2012-10-22 (×2): qty 50

## 2012-10-22 MED ORDER — BISACODYL 10 MG RE SUPP
10.0000 mg | Freq: Every day | RECTAL | Status: DC
  Administered 2012-10-26 – 2012-10-27 (×2): 10 mg via RECTAL
  Filled 2012-10-22 (×2): qty 1

## 2012-10-22 MED ORDER — SODIUM CHLORIDE 0.9 % IV SOLN
INTRAVENOUS | Status: DC | PRN
  Administered 2012-10-22: 14:00:00 via INTRAVENOUS

## 2012-10-22 MED ORDER — DEXTROSE 5 % IV SOLN
1.5000 g | INTRAVENOUS | Status: DC | PRN
  Administered 2012-10-22: 1.5 g via INTRAVENOUS

## 2012-10-22 MED ORDER — SODIUM CHLORIDE 0.9 % IJ SOLN
3.0000 mL | Freq: Two times a day (BID) | INTRAMUSCULAR | Status: DC
  Administered 2012-10-23: 3 mL via INTRAVENOUS
  Administered 2012-10-23: 10:00:00 via INTRAVENOUS
  Administered 2012-10-24 – 2012-10-27 (×5): 3 mL via INTRAVENOUS

## 2012-10-22 MED ORDER — ACETAMINOPHEN 160 MG/5ML PO SOLN: 975.0000 mg | Freq: Four times a day (QID) | ORAL | Status: DC

## 2012-10-22 MED ORDER — 0.9 % SODIUM CHLORIDE (POUR BTL) OPTIME
TOPICAL | Status: DC | PRN
  Administered 2012-10-22: 1000 mL

## 2012-10-22 MED ORDER — POTASSIUM CHLORIDE 10 MEQ/50ML IV SOLN
10.0000 meq | INTRAVENOUS | Status: AC
  Administered 2012-10-22 (×3): 10 meq via INTRAVENOUS

## 2012-10-22 MED ORDER — LIDOCAINE HCL (CARDIAC) 20 MG/ML IV SOLN
INTRAVENOUS | Status: DC | PRN
  Administered 2012-10-22: 100 mg via INTRAVENOUS

## 2012-10-22 MED ORDER — ACETAMINOPHEN 500 MG PO TABS
1000.0000 mg | ORAL_TABLET | Freq: Four times a day (QID) | ORAL | Status: AC
  Administered 2012-10-23 – 2012-10-24 (×5): 1000 mg via ORAL
  Filled 2012-10-22 (×19): qty 2

## 2012-10-22 MED ORDER — METOPROLOL TARTRATE 1 MG/ML IV SOLN: 2.5000 mg | INTRAVENOUS | Status: DC | PRN

## 2012-10-22 MED ORDER — ASPIRIN 81 MG PO CHEW: 324.0000 mg | CHEWABLE_TABLET | Freq: Every day | ORAL | Status: DC

## 2012-10-22 MED ORDER — CHLORHEXIDINE GLUCONATE CLOTH 2 % EX PADS
6.0000 | MEDICATED_PAD | Freq: Every day | CUTANEOUS | Status: AC
  Administered 2012-10-23 – 2012-10-27 (×5): 6 via TOPICAL

## 2012-10-22 MED ORDER — INSULIN ASPART 100 UNIT/ML ~~LOC~~ SOLN
0.0000 [IU] | SUBCUTANEOUS | Status: DC
  Administered 2012-10-23: 2 [IU] via SUBCUTANEOUS
  Administered 2012-10-23: 4 [IU] via SUBCUTANEOUS
  Administered 2012-10-23: 2 [IU] via SUBCUTANEOUS

## 2012-10-22 MED ORDER — FAMOTIDINE IN NACL 20-0.9 MG/50ML-% IV SOLN
20.0000 mg | Freq: Two times a day (BID) | INTRAVENOUS | Status: AC
  Administered 2012-10-22 (×2): 20 mg via INTRAVENOUS
  Filled 2012-10-22: qty 50

## 2012-10-22 MED ORDER — MUPIROCIN 2 % EX OINT
1.0000 "application " | TOPICAL_OINTMENT | Freq: Two times a day (BID) | CUTANEOUS | Status: AC
  Administered 2012-10-22 – 2012-10-27 (×10): 1 via NASAL
  Filled 2012-10-22 (×2): qty 22

## 2012-10-22 MED ORDER — SODIUM CHLORIDE 0.9 % IV SOLN
INTRAVENOUS | Status: DC
  Filled 2012-10-22: qty 1

## 2012-10-22 MED ORDER — HEPARIN SODIUM (PORCINE) 1000 UNIT/ML IJ SOLN
INTRAMUSCULAR | Status: DC | PRN
  Administered 2012-10-22: 16000 [IU] via INTRAVENOUS

## 2012-10-22 MED ORDER — SODIUM CHLORIDE 0.45 % IV SOLN
INTRAVENOUS | Status: DC
  Administered 2012-10-22: 15:00:00 via INTRAVENOUS

## 2012-10-22 MED ORDER — PROPOFOL 10 MG/ML IV BOLUS
INTRAVENOUS | Status: DC | PRN
  Administered 2012-10-22: 50 mg via INTRAVENOUS

## 2012-10-22 MED ORDER — DOCUSATE SODIUM 100 MG PO CAPS
200.0000 mg | ORAL_CAPSULE | Freq: Every day | ORAL | Status: DC
  Administered 2012-10-23 – 2012-10-29 (×7): 200 mg via ORAL
  Filled 2012-10-22 (×8): qty 2

## 2012-10-22 SURGICAL SUPPLY — 141 items
ADAPTER CARDIO PERF ANTE/RETRO (ADAPTER) ×3 IMPLANT
APPLIER CLIP 9.375 MED OPEN (MISCELLANEOUS)
APPLIER CLIP 9.375 SM OPEN (CLIP)
ATRICLIP EXCLUSION 35 STD HAND (Clip) ×3 IMPLANT
ATTRACTOMAT 16X20 MAGNETIC DRP (DRAPES) ×3 IMPLANT
BAG DECANTER FOR FLEXI CONT (MISCELLANEOUS) ×9 IMPLANT
BANDAGE ELASTIC 4 VELCRO ST LF (GAUZE/BANDAGES/DRESSINGS) ×3 IMPLANT
BANDAGE ELASTIC 6 VELCRO ST LF (GAUZE/BANDAGES/DRESSINGS) ×3 IMPLANT
BANDAGE GAUZE ELAST BULKY 4 IN (GAUZE/BANDAGES/DRESSINGS) ×3 IMPLANT
BASKET HEART (ORDER IN 25'S) (MISCELLANEOUS) ×1
BASKET HEART (ORDER IN 25S) (MISCELLANEOUS) ×2 IMPLANT
BENZOIN TINCTURE PRP APPL 2/3 (GAUZE/BANDAGES/DRESSINGS) IMPLANT
BLADE STERNUM SYSTEM 6 (BLADE) ×3 IMPLANT
BLADE SURG 11 STRL SS (BLADE) ×3 IMPLANT
BLADE SURG ROTATE 9660 (MISCELLANEOUS) IMPLANT
CANISTER SUCTION 2500CC (MISCELLANEOUS) ×6 IMPLANT
CANN PRFSN 3/8X14X24FR PCFC (MISCELLANEOUS)
CANN PRFSN 3/8XCNCT ST RT ANG (MISCELLANEOUS)
CANNULA FEM VENOUS REMOTE 22FR (CANNULA) ×3 IMPLANT
CANNULA GUNDRY RCSP 15FR (MISCELLANEOUS) ×3 IMPLANT
CANNULA PRFSN 3/8X14X24FR PCFC (MISCELLANEOUS) IMPLANT
CANNULA PRFSN 3/8XCNCT RT ANG (MISCELLANEOUS) IMPLANT
CANNULA VEN MTL TIP RT (MISCELLANEOUS)
CANNULA VENNOUS METAL TIP 20FR (CANNULA) ×3 IMPLANT
CATH CPB KIT OWEN (MISCELLANEOUS) ×3 IMPLANT
CATH THORACIC 28FR (CATHETERS) IMPLANT
CATH THORACIC 28FR RT ANG (CATHETERS) IMPLANT
CATH THORACIC 36FR (CATHETERS) ×3 IMPLANT
CATH THORACIC 36FR RT ANG (CATHETERS) IMPLANT
CLAMP ISOLATOR SYNERGY LG (MISCELLANEOUS) ×6 IMPLANT
CLIP APPLIE 9.375 MED OPEN (MISCELLANEOUS) IMPLANT
CLIP APPLIE 9.375 SM OPEN (CLIP) IMPLANT
CLIP FOGARTY SPRING 6M (CLIP) IMPLANT
CLIP RETRACTION 3.0MM CORONARY (MISCELLANEOUS) ×3 IMPLANT
CLIP TI MEDIUM 24 (CLIP) IMPLANT
CLIP TI WIDE RED SMALL 24 (CLIP) IMPLANT
CLOTH BEACON ORANGE TIMEOUT ST (SAFETY) ×3 IMPLANT
CONN 1/2X1/2X1/2  BEN (MISCELLANEOUS) ×2
CONN 1/2X1/2X1/2 BEN (MISCELLANEOUS) ×4 IMPLANT
CONN 3/8X1/2 ST GISH (MISCELLANEOUS) IMPLANT
CONN ST 1/4X3/8  BEN (MISCELLANEOUS) ×2
CONN ST 1/4X3/8 BEN (MISCELLANEOUS) ×4 IMPLANT
CONN Y 3/8X3/8X3/8  BEN (MISCELLANEOUS) ×1
CONN Y 3/8X3/8X3/8 BEN (MISCELLANEOUS) ×2 IMPLANT
COVER SURGICAL LIGHT HANDLE (MISCELLANEOUS) ×3 IMPLANT
CRADLE DONUT ADULT HEAD (MISCELLANEOUS) ×3 IMPLANT
DERMABOND ADHESIVE PROPEN (GAUZE/BANDAGES/DRESSINGS) ×1
DERMABOND ADVANCED .7 DNX6 (GAUZE/BANDAGES/DRESSINGS) ×2 IMPLANT
DRAIN CHANNEL 32F RND 10.7 FF (WOUND CARE) ×6 IMPLANT
DRAPE CARDIOVASCULAR INCISE (DRAPES) ×1
DRAPE SLUSH/WARMER DISC (DRAPES) ×3 IMPLANT
DRAPE SRG 135X102X78XABS (DRAPES) ×2 IMPLANT
DRSG COVADERM 4X14 (GAUZE/BANDAGES/DRESSINGS) IMPLANT
ELECT REM PT RETURN 9FT ADLT (ELECTROSURGICAL) ×6
ELECTRODE REM PT RTRN 9FT ADLT (ELECTROSURGICAL) ×4 IMPLANT
GLOVE BIO SURGEON STRL SZ 6 (GLOVE) ×12 IMPLANT
GLOVE BIO SURGEON STRL SZ 6.5 (GLOVE) ×24 IMPLANT
GLOVE BIO SURGEON STRL SZ7 (GLOVE) ×12 IMPLANT
GLOVE BIO SURGEON STRL SZ7.5 (GLOVE) IMPLANT
GLOVE BIOGEL PI IND STRL 6 (GLOVE) IMPLANT
GLOVE BIOGEL PI IND STRL 6.5 (GLOVE) IMPLANT
GLOVE BIOGEL PI IND STRL 7.0 (GLOVE) ×8 IMPLANT
GLOVE BIOGEL PI INDICATOR 6 (GLOVE)
GLOVE BIOGEL PI INDICATOR 6.5 (GLOVE)
GLOVE BIOGEL PI INDICATOR 7.0 (GLOVE) ×4
GLOVE EUDERMIC 7 POWDERFREE (GLOVE) IMPLANT
GLOVE ORTHO TXT STRL SZ7.5 (GLOVE) ×15 IMPLANT
GOWN PREVENTION PLUS XLARGE (GOWN DISPOSABLE) ×3 IMPLANT
GOWN STRL NON-REIN LRG LVL3 (GOWN DISPOSABLE) ×39 IMPLANT
HEMOSTAT POWDER SURGIFOAM 1G (HEMOSTASIS) ×9 IMPLANT
INSERT FOGARTY 61MM (MISCELLANEOUS) IMPLANT
INSERT FOGARTY XLG (MISCELLANEOUS) ×3 IMPLANT
KIT BASIN OR (CUSTOM PROCEDURE TRAY) ×3 IMPLANT
KIT DILATOR VASC 18G NDL (KITS) ×3 IMPLANT
KIT DRAINAGE VACCUM ASSIST (KITS) ×3 IMPLANT
KIT ROOM TURNOVER OR (KITS) ×3 IMPLANT
KIT SUCTION CATH 14FR (SUCTIONS) ×9 IMPLANT
KIT VASOVIEW W/TROCAR VH 2000 (KITS) ×3 IMPLANT
LEAD PACING MYOCARDI (MISCELLANEOUS) ×3 IMPLANT
LINE VENT (MISCELLANEOUS) ×3 IMPLANT
LOOP VESSEL SUPERMAXI WHITE (MISCELLANEOUS) ×3 IMPLANT
MARKER GRAFT CORONARY BYPASS (MISCELLANEOUS) ×9 IMPLANT
NS IRRIG 1000ML POUR BTL (IV SOLUTION) ×15 IMPLANT
PACK OPEN HEART (CUSTOM PROCEDURE TRAY) ×3 IMPLANT
PAD ARMBOARD 7.5X6 YLW CONV (MISCELLANEOUS) ×9 IMPLANT
PENCIL BUTTON HOLSTER BLD 10FT (ELECTRODE) ×3 IMPLANT
PROBE CRYO2-ABLATION MALLABLE (MISCELLANEOUS) ×3 IMPLANT
PUNCH AORTIC ROTATE 4.0MM (MISCELLANEOUS) ×3 IMPLANT
PUNCH AORTIC ROTATE 4.5MM 8IN (MISCELLANEOUS) IMPLANT
PUNCH AORTIC ROTATE 5MM 8IN (MISCELLANEOUS) IMPLANT
SET CARDIOPLEGIA MPS 5001102 (MISCELLANEOUS) ×3 IMPLANT
SET IRRIG TUBING LAPAROSCOPIC (IRRIGATION / IRRIGATOR) IMPLANT
SOLUTION ANTI FOG 6CC (MISCELLANEOUS) IMPLANT
SPONGE GAUZE 4X4 12PLY (GAUZE/BANDAGES/DRESSINGS) IMPLANT
SPONGE LAP 18X18 X RAY DECT (DISPOSABLE) ×3 IMPLANT
SPONGE LAP 4X18 X RAY DECT (DISPOSABLE) IMPLANT
SUCKER INTRACARDIAC WEIGHTED (SUCKER) IMPLANT
SUT BONE WAX W31G (SUTURE) ×3 IMPLANT
SUT ETHIBOND 2 0 SH (SUTURE) ×2
SUT ETHIBOND 2 0 SH 36X2 (SUTURE) ×4 IMPLANT
SUT ETHIBOND X763 2 0 SH 1 (SUTURE) ×3 IMPLANT
SUT MNCRL AB 3-0 PS2 18 (SUTURE) ×6 IMPLANT
SUT MNCRL AB 4-0 PS2 18 (SUTURE) ×6 IMPLANT
SUT PDS AB 1 CTX 36 (SUTURE) ×6 IMPLANT
SUT PROLENE 2 0 SH DA (SUTURE) IMPLANT
SUT PROLENE 3 0 SH 1 (SUTURE) IMPLANT
SUT PROLENE 3 0 SH DA (SUTURE) ×6 IMPLANT
SUT PROLENE 3 0 SH1 36 (SUTURE) IMPLANT
SUT PROLENE 4 0 RB 1 (SUTURE)
SUT PROLENE 4 0 SH DA (SUTURE) ×6 IMPLANT
SUT PROLENE 4-0 RB1 .5 CRCL 36 (SUTURE) IMPLANT
SUT PROLENE 5 0 C 1 36 (SUTURE) IMPLANT
SUT PROLENE 6 0 C 1 30 (SUTURE) ×6 IMPLANT
SUT PROLENE 7.0 RB 3 (SUTURE) ×12 IMPLANT
SUT PROLENE 8 0 BV175 6 (SUTURE) ×15 IMPLANT
SUT PROLENE BLUE 7 0 (SUTURE) ×3 IMPLANT
SUT PROLENE POLY MONO (SUTURE) ×9 IMPLANT
SUT SILK  1 MH (SUTURE) ×2
SUT SILK 1 MH (SUTURE) ×4 IMPLANT
SUT STEEL 6MS V (SUTURE) IMPLANT
SUT STEEL STERNAL CCS#1 18IN (SUTURE) ×3 IMPLANT
SUT STEEL SZ 6 DBL 3X14 BALL (SUTURE) ×6 IMPLANT
SUT VIC AB 1 CTX 36 (SUTURE)
SUT VIC AB 1 CTX36XBRD ANBCTR (SUTURE) IMPLANT
SUT VIC AB 2-0 CT1 27 (SUTURE) ×1
SUT VIC AB 2-0 CT1 TAPERPNT 27 (SUTURE) ×2 IMPLANT
SUT VIC AB 2-0 CTX 27 (SUTURE) IMPLANT
SUT VIC AB 3-0 SH 27 (SUTURE)
SUT VIC AB 3-0 SH 27X BRD (SUTURE) IMPLANT
SUT VIC AB 3-0 X1 27 (SUTURE) IMPLANT
SUT VICRYL 4-0 PS2 18IN ABS (SUTURE) IMPLANT
SUTURE E-PAK OPEN HEART (SUTURE) ×3 IMPLANT
SYS ATRICLIP LAA EXCLUSION 45 (CLIP) IMPLANT
SYSTEM SAHARA CHEST DRAIN ATS (WOUND CARE) ×3 IMPLANT
TOWEL OR 17X24 6PK STRL BLUE (TOWEL DISPOSABLE) ×12 IMPLANT
TOWEL OR 17X26 10 PK STRL BLUE (TOWEL DISPOSABLE) ×12 IMPLANT
TRAY FOLEY IC TEMP SENS 14FR (CATHETERS) ×3 IMPLANT
TUBE SUCT INTRACARD DLP 20F (MISCELLANEOUS) ×3 IMPLANT
TUBING INSUFFLATION 10FT LAP (TUBING) ×3 IMPLANT
UNDERPAD 30X30 INCONTINENT (UNDERPADS AND DIAPERS) ×3 IMPLANT
WATER STERILE IRR 1000ML POUR (IV SOLUTION) ×6 IMPLANT
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
## 2012-10-22 NOTE — Progress Notes (Signed)
Dr. Cornelius Moras notified of decreased SBP into the 40's and PAD dropping from 22 to 14 and CI dropping from 1.7 to 1.2 when patient woke up and started coughing and  gagging pretty hard. Zofran 4 mg IV given and Neosynephrine increased to 50 mcg. Patient had just completed 4th Albumin 5% 250 ml. Orders received for 2 additional albumin.  10/22/12 1820  Vitals  Temp ! 96.3 F (35.7 C)  BP ! 48/25 mmHg  MAP (mmHg) 30  Pulse Rate 92  ECG Heart Rate 87  Resp 15  Oxygen Therapy  SpO2 95 %  FiO2 (%) 49.6 %  Art Line  Arterial Line BP 55/35 mmHg  Arterial Line MAP (mmHg) 42 mmHg  Invasive Hemodynamic Monitoring  PAP 23/14 mmHg  PAP (Mean) 18 mmHg  Mechanical Ventilation  Vt (observed, mL) 478 mL  PIP Observed (cm H2O) 22.2 cm H2O

## 2012-10-22 NOTE — Progress Notes (Signed)
RT tried to start rapid wean protocol, however pt did not tolerate. Rt will try again later. Rt will continue to assist as needed.

## 2012-10-22 NOTE — Preoperative (Signed)
Beta Blockers   Reason not to administer Beta Blockers:Atenolol taken this am

## 2012-10-22 NOTE — Progress Notes (Signed)
Patient ID: Claudia Howell, female   DOB: [birth year], 72 y.o.   MRN: [id number]  SICU Evening Rounds:  Hemodynamically stable Still cold and on vent but has woken up  She had sudden bleeding from right groin venous cannulation site postop after coughing on vent and required transfusion of prbc's and FFP. The site is dry now after holding pressure.  Urine output ok  BMET    Component Value Date/Time   NA 139 10/22/2012 1514   K 3.8 10/22/2012 1514   CL 100 10/21/2012 1509   CO2 28 10/21/2012 1509   GLUCOSE 104* 10/22/2012 1514   BUN 19 10/21/2012 1509   CREATININE 0.81 10/21/2012 1509   CREATININE 0.70 10/06/2012 1116   CALCIUM 9.4 10/21/2012 1509   GFRNONAA 72* 10/21/2012 1509   GFRAA 84* 10/21/2012 1509    CBC    Component Value Date/Time   WBC 18.4* 10/22/2012 1500   RBC 3.32* 10/22/2012 1500   HGB 10.2* 10/22/2012 1514   HCT 30.0* 10/22/2012 1514   PLT 109* 10/22/2012 1500   MCV 87.7 10/22/2012 1500   MCH 31.6 10/22/2012 1500   MCHC 36.1* 10/22/2012 1500   RDW 12.2 10/22/2012 1500   LYMPHSABS 1.8 10/06/2012 1116   MONOABS 0.5 10/06/2012 1116   EOSABS 0.2 10/06/2012 1116   BASOSABS 0.0 10/06/2012 1116    INR 1.6  Wean to extubate once she wakes up.

## 2012-10-22 NOTE — Anesthesia Procedure Notes (Signed)
Procedure Name: Intubation Performed by: Sherie Don Pre-anesthesia Checklist: Patient identified, Emergency Drugs available, Suction available, Patient being monitored and Timeout performed Patient Re-evaluated:Patient Re-evaluated prior to inductionOxygen Delivery Method: Circle system utilized Preoxygenation: Pre-oxygenation with 100% oxygen Intubation Type: IV induction Ventilation: Mask ventilation without difficulty Laryngoscope Size: Mac and 3 Grade View: Grade III Tube type: Oral Tube size: 8.0 mm Number of attempts: 2 Airway Equipment and Method: Stylet and Bougie stylet Placement Confirmation: ETT inserted through vocal cords under direct vision,  positive ETCO2 and breath sounds checked- equal and bilateral Secured at: 22 cm Tube secured with: Tape Dental Injury: Teeth and Oropharynx as per pre-operative assessment

## 2012-10-22 NOTE — OR Nursing (Signed)
First call to SICU - 1340.  Family notified also of patient off pump and closing

## 2012-10-22 NOTE — Progress Notes (Signed)
TCTS BRIEF SICU PROGRESS NOTE  Day of Surgery  S/P Procedure(s) (LRB): CORONARY ARTERY BYPASS GRAFTING (CABG) (N/A) MAZE (N/A) INTRAOPERATIVE TRANSESOPHAGEAL ECHOCARDIOGRAM (N/A)   Called to see patient who had previously been entirely stable and recovering uneventfully Patient was waking up and began coughing and moving around when she developed brief episode hypotension with low filling pressures which resolved with volume administration.  When blankets pulled down bleeding from right groin at site percutaneous venous cannulation noted.  Plan: Manual pressure on groin.  Keep sedated and calm on Precedex for now.  Transfuse FFP and platelets for coagulopathy.  Transfuse PRBC's for acute blood loss anemia with hypovolemia.  Krysti Hickling H 10/22/2012 6:45 PM

## 2012-10-22 NOTE — Interval H&P Note (Signed)
History and Physical Interval Note:  10/22/2012 8:24 AM  Claudia Howell  has presented today for surgery, with the diagnosis of CAD A-FIB  The various methods of treatment have been discussed with the patient and family. After consideration of risks, benefits and other options for treatment, the patient has consented to  Procedure(s): CORONARY ARTERY BYPASS GRAFTING (CABG) (N/A) MAZE (N/A) INTRAOPERATIVE TRANSESOPHAGEAL ECHOCARDIOGRAM (N/A) as a surgical intervention .  The patient's history has been reviewed, patient examined, no change in status, stable for surgery.  I have reviewed the patient's chart and labs.  Questions were answered to the patient's satisfaction.     Koury Roddy H

## 2012-10-22 NOTE — Progress Notes (Signed)
  Echocardiogram Echocardiogram Transesophageal has been performed.  Georgian Co 10/22/2012, 10:17 AM

## 2012-10-22 NOTE — Care Management Note (Signed)
    Page 1 of 2   10/29/2012     4:17:08 PM   CARE MANAGEMENT NOTE 10/29/2012  Patient:  Claudia Howell, Claudia Howell   Account Number:  [id number]  Date Initiated:  10/22/2012  Documentation initiated by:  Alvira Philips Assessment:   72 yr-old female adm with CAD s/p CABG; lives alone, son and dtr-in-law live in area and are very supportive; pt independent PTA     Action/Plan:   Anticipated DC Date:  10/29/2012   Anticipated DC Plan:  SKILLED NURSING FACILITY  In-house referral  Clinical Social Worker      DC Planning Services  CM consult      Choice offered to / List presented to:             Status of service:  Completed, signed off Medicare Important Message given?   (If response is "NO", the following Medicare IM given date fields will be blank) Date Medicare IM given:   Date Additional Medicare IM given:    Discharge Disposition:  SKILLED NURSING FACILITY  Per UR Regulation:  Reviewed for med. necessity/level of care/duration of stay  If discussed at Long Length of Stay Meetings, dates discussed:    Comments:  PCP: Dr Joselyn Arrow Cardiologist: Dr Verdis Prime  10/29/12 Kashif Pooler,RN,BSN [phone] PT DC'D TO SNF TODAY, PER CSW ARRANGEMENTS.  10/28/12 Hillel Card,RN,BSN [phone] DAUGHTER NOW STATES SHE CANNOT PROVIDE 24HR CARE AT DC.  PT REQUESTING ST-SNF PLACEMENT FOR REHAB.  WILL CONSULT CSW TO FACILITATE DC TO SNF WHEN MEDICALLY STABLE.  10/27/12 Hidaya Daniel,RN,BSN [phone] MET WITH DAUGHTER AND PT TO DISCUSS DC PLANS.  DTR STATES SHE WORKS, BUT CAN TAKE FMLA IF NEEDED TO PROVIDE SHORT TERM 24HR CARE.  PT HAS LONG TERM CARE INSURANCE POLICY; DTR WILL REVIEW TO SEE IF ANY 24HR CARE IS INCLUDED IN THIS.  UNFORTUNATELY, MEDICARE DOES NOT COVER ANY 24HR AIDES OR SITTERS FOR PATIENTS POST SURGERY.  FAMILY WILL LIKELY HAVE TO ARRANGE COVERAGE FOR 24HR CARE.  THEY UNDERSTAND THIS.

## 2012-10-22 NOTE — Op Note (Signed)
CARDIOTHORACIC SURGERY OPERATIVE NOTE  Date of Procedure: 10/22/2012  Preoperative Diagnosis:   Severe 3-vessel Coronary Artery Disease  Paroxysmal Atrial Fibrillation  Postoperative Diagnosis:   Same  Procedure:    Coronary Artery Bypass Grafting x 3  Left Internal Mammary Artery to Distal Left Anterior Descending Coronary Artery Saphenous Vein Graft to Distal RIght Coronary Artery Saphenous Vein Graft to Obtuse Marginal Branch of Left Circumflex Coronary Artery Endoscopic Vein Harvest from Right Thigh    Maze Procedure  Left atrial lesion set using bipolar radiofrequency and cryothermy ablation Clipping of LA appendage  Surgeon: Salvatore Decent. 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 The patient states that approximately 11 months ago she first began to experience intermittent episodes of substernal chest discomfort radiating down her arms that occurs with physical activity. Over the past summer these symptoms were somewhat sporadic, but over the last few months they have accelerated considerably. Since Christmas the patient has had 3 separate episodes of prolonged substernal chest discomfort lasting 2-3 hours, most recently one that occurred just prior to presentation with Dr. Katrinka Blazing. The patient was admitted to the hospital for diagnostic cardiac catheterization which revealed severe three-vessel coronary artery disease with preserved left ventricular function. During the  procedure the patient also was noted to develop paroxysmal atrial fibrillation which terminated spontaneously.  Cardiothoracic surgical consultation was requested, and I had the opportunity to see the patient in consultation on 10/17/2012 while she was in the hospital.  The patient has been seen in consultation and counseled at length regarding the indications, risks and potential benefits of surgery.  All questions have been answered, and the patient provides full informed consent for the operation as described. She remained very stable post-catheterization with no further episodes of atrial fibrillation, and she was discharged with plans to return for surgery on 10/22/2012.    DETAILS OF THE OPERATIVE PROCEDURE  The patient is brought to the operating room on the above mentioned date and central monitoring was established by the anesthesia team including placement of Swan-Ganz catheter and radial arterial line. The patient is placed in the supine position on the operating table.  Intravenous antibiotics are administered. General endotracheal anesthesia is induced uneventfully. A Foley catheter is placed.  Baseline transesophageal echocardiogram was performed.  Findings were notable for normal LV size and systolic function.  There were no other significant findings noted.  The patient's chest, abdomen, both groins, and both lower extremities are prepared and draped in a sterile manner. A time out procedure is performed.  A median sternotomy incision was performed and the left internal mammary artery is dissected from the chest wall and prepared for bypass grafting. The left internal mammary artery is notably good quality conduit. Simultaneously, saphenous vein is obtained from the patient's right thigh using endoscopic vein harvest technique. The saphenous vein is notably good quality conduit. After removal of the saphenous vein, the small surgical incisions in the lower extremity are closed with  absorbable suture. Following systemic heparinization, the left internal mammary artery was  transected distally noted to have excellent flow.  The pericardium is opened. The ascending aorta is normal in appearance. The right common femoral vein is cannulated using the Seldinger technique and a guidewire advanced into the right atrium using TEE guidance.  The patient is heparinized systemically and the femoral vein cannulated using a 22 Fr long femoral venous cannula.  The ascending aorta is cannulated for cardiopulmonary bypass.  Adequate heparinization is verified.   A retrograde cardioplegia cannula is placed through the right atrium into the coronary sinus.   The entire pre-bypass portion of the operation was notable for stable hemodynamics.  Cardiopulmonary bypass was begun and the surface of the heart is inspected.  Distal target vessels are inspected for coronary artery bypass.  A second venous cannula is placed directly into the superior vena cava.   A cardioplegia cannula is placed in the ascending aorta.  A temperature probe was placed in the interventricular septum.  The patient is cooled to 32C systemic temperature.  The aortic cross clamp is applied and cold blood cardioplegia is delivered initially in an antegrade fashion through the aortic root.   Supplemental cardioplegia is given retrograde through the coronary sinus catheter.  Iced saline slush is applied for topical hypothermia.  The initial cardioplegic arrest is rapid with early diastolic arrest.  Repeat doses of cardioplegia are administered intermittently throughout the entire cross clamp portion of the operation through the aortic root, down subsequently placed vein grafts, and through the coronary sinus catheter in order to maintain completely flat electrocardiogram and septal myocardial temperature below 15C.  Myocardial protection was felt to be excellent.  The Atricure bipolar radiofrequency ablation clamp is used for all  radiofrequency ablation lesions for the maze procedure.  The heart is retracted towards the surgeon's side and the left sided pulmonary veins exposed.  An elliptical ablation lesion is created around the base of the left sided pulmonary veins.  A similar elliptical lesion was created around the base of the left atrial appendage.  The left atrial appendage was obliterated by deploying a 35 mm Atricure left atrial appendage clip across the appendage.  The heart was replaced into the pericardial sac.  The following distal coronary artery bypass grafts were performed:   The terminal branches of the right coronary system were all very small caliber.  Therefore, the distal right coronary artery was grafted using a reversed saphenous vein graft in an end-to-side fashion.  The distal anastomosis was opened across the bifurcation of the distal right coronary with the posterior descending coronary artery where a tight stenosis existed.  At the site of distal anastomosis the target vessel was fair quality and measured approximately 1.5 mm in diameter.    The obtuse marginal branch of the left circumflex coronary artery was grafted using a reversed saphenous vein graft in an end-to-side fashion.  At the site of distal anastomosis the target vessel was good quality and measured approximately 1.4 mm in diameter.  The distal left anterior coronary artery was grafted with the left internal mammary artery in an end-to-side fashion.  At the site of distal anastomosis the target vessel was good quality and measured approximately 1.5 mm in diameter.  A left atriotomy incision was performed through the interatrial groove and extended partially across the back wall of the left atrium after opening the oblique sinus inferiorly.  The floor of the left atrium and the mitral valve were exposed using a self-retaining retractor.  The mitral valve was inspected and notable for normal  anatomy .    An ablation lesion was placed  around the right sided pulmonary veins using the bipolar clamp with one limb of the clamp along the endocardial surface and one along the epicardial surface posteriorly.  A bipolar ablation lesion was placed across the dome of the left atrium from the cephalad apex of the atriotomy incision to reach the cephalad apex of the elliptical lesion around the left sided pulmonary veins.  A similar bipolar lesion was placed across the back wall of the left atrium from the caudad apex of the atriotomy incision to reach the caudad apex of the elliptical lesion around the left sided pulmonary veins, thereby completing a box.  Finally another bipolar lesion was placed across the back wall of the left atrium from the caudad apex of the atriotomy incision towards the posterior mitral valve annulus.  This lesion was completed along the endocardial surface onto the posterior mitral annulus with a 3 minute duration cryothermy lesion, followed by a second cryothermy lesion along the posterior epicardial surface of the left atrium to the coronary sinus.  This completes the entire left side lesion set of the Cox maze procedure.  The left atriotomy incision is closed using a 2 layer closure of running 3-0 Prolene suture after placing a sump drain across the mitral valve to serve as a left ventricular vent.  All proximal vein graft anastomoses were placed directly to the ascending aorta prior to removal of the aortic cross clamp.  The septal myocardial temperature rose rapidly after reperfusion of the left internal mammary artery graft.  One final dose of warm retrograde "hot shot" cardioplegia was administered retrograde through the coronary sinus catheter while all air was evacuated through the aortic root.  The aortic cross clamp was removed after a total cross clamp time of 111 minutes.  The heart began to be spontaneously without any for cardioversion. The retrograde cardioplegia cannula is removed.  All proximal and distal  coronary anastomoses were inspected for hemostasis and appropriate graft orientation. Epicardial pacing wires are fixed to the right ventricular outflow tract and to the right atrial appendage. The patient is rewarmed to 37C temperature. The left ventricular vent is removed. The superior vena cava cannula was removed.  The patient is weaned and disconnected from cardiopulmonary bypass.  The patient's rhythm at separation from bypass was AV paced.  The patient was weaned from cardioplegic bypass without any inotropic support. Total cardiopulmonary bypass time for the operation was 138 minutes.  Followup transesophageal echocardiogram performed after separation from bypass revealed no changes from the preoperative exam.  The aortic cannula was removed uneventfully. Protamine was administered to reverse the anticoagulation. The femoral venous cannula is removed and manual pressure held on the groin for 30 minutes.  The mediastinum and pleural space were inspected for hemostasis and irrigated with saline solution. The mediastinum and the left pleural space were drained using 3 chest tubes placed through separate stab incisions inferiorly.  The soft tissues anterior to the aorta were reapproximated loosely. The sternum is closed with double strength sternal wire. The soft tissues anterior to the sternum were closed in multiple layers and the skin is closed with a running subcuticular skin closure.  The post-bypass portion of the operation was notable for stable rhythm and hemodynamics.  No blood products were administered during the operation.  The patient tolerated the procedure well and is transported to the surgical intensive care in stable condition. There are no intraoperative complications. All sponge instrument and needle  counts are verified correct at completion of the operation.    Salvatore Decent. Cornelius Moras MD 10/22/2012 2:34 PM

## 2012-10-22 NOTE — Transfer of Care (Signed)
Immediate Anesthesia Transfer of Care Note  Patient: Claudia Howell  Procedure(s) Performed: Procedure(s): CORONARY ARTERY BYPASS GRAFTING (CABG) (N/A) MAZE (N/A) INTRAOPERATIVE TRANSESOPHAGEAL ECHOCARDIOGRAM (N/A)  Patient Location: PACU and SICU  Anesthesia Type:General  Level of Consciousness: sedated and Patient remains intubated per anesthesia plan  Airway & Oxygen Therapy: Patient remains intubated per anesthesia plan  Post-op Assessment: Post -op Vital signs reviewed and stable  Post vital signs: Reviewed and stable report given to SICU RN  Complications: No apparent anesthesia complications

## 2012-10-22 NOTE — Brief Op Note (Addendum)
10/22/2012  12:55 PM  PATIENT:  Claudia Howell  72 y.o. female  PRE-OPERATIVE DIAGNOSIS:  1.Coronary Artery Disease 2. Atrial fibrillation  POST-OPERATIVE  1.Coronary Artery Disease 2. Atrial fibrillation  PROCEDURE:INTRAOPERATIVE TRANSESOPHAGEAL ECHOCARDIOGRAM, MEDIAN STERNOTOMY for CORONARY ARTERY BYPASS GRAFTING (CABG) s 3 (LIMA to LAD, SVG to OM, and SVG to distal RCA) with EVH from the right thigh, MAZE   SURGEON:    Purcell Nails, MD  ASSISTANTS:  Ardelle Balls, PA-C  ANESTHESIA:   Rivka Barbara, MD  CROSSCLAMP TIME:   111'  CARDIOPULMONARY BYPASS TIME: 138'  FINDINGS:  Normal LV systolic function  Good quality LIMA and SVG conduit for grafting  Small caliber but o/w good quality target vessels for grafting  PRE OP WEIGHT: 65 kg  COMPLICATIONS: none  PATIENT DISPOSITION:   TO SICU IN STABLE CONDITION  [name] 10/22/2012 2:15 PM

## 2012-10-22 NOTE — Anesthesia Preprocedure Evaluation (Addendum)
Anesthesia Evaluation  Patient identified by MRN, date of birth, ID band Patient awake    Reviewed: Allergy & Precautions, H&P , NPO status , Patient's Chart, lab work & pertinent test results  Airway Mallampati: I TM Distance: >3 FB Neck ROM: full    Dental   Pulmonary          Cardiovascular hypertension, Pt. on medications and Pt. on home beta blockers + CAD + dysrhythmias Atrial Fibrillation Rhythm:regular Rate:Normal     Neuro/Psych    GI/Hepatic   Endo/Other    Renal/GU      Musculoskeletal   Abdominal   Peds  Hematology   Anesthesia Other Findings   Reproductive/Obstetrics                          Anesthesia Physical Anesthesia Plan  ASA: III  Anesthesia Plan: General   Post-op Pain Management:    Induction: Intravenous  Airway Management Planned: Oral ETT  Additional Equipment: Arterial line, CVP, PA Cath and TEE  Intra-op Plan:   Post-operative Plan: Post-operative intubation/ventilation  Informed Consent: I have reviewed the patients History and Physical, chart, labs and discussed the procedure including the risks, benefits and alternatives for the proposed anesthesia with the patient or authorized representative who has indicated his/her understanding and acceptance.     Plan Discussed with: CRNA, Anesthesiologist and Surgeon  Anesthesia Plan Comments:         Anesthesia Quick Evaluation

## 2012-10-23 ENCOUNTER — Inpatient Hospital Stay (HOSPITAL_COMMUNITY): Payer: Medicare Other

## 2012-10-23 LAB — PREPARE FRESH FROZEN PLASMA
Unit division: 0
Unit division: 0

## 2012-10-23 LAB — GLUCOSE, CAPILLARY
Glucose-Capillary: 100 mg/dL — ABNORMAL HIGH (ref 70–99)
Glucose-Capillary: 102 mg/dL — ABNORMAL HIGH (ref 70–99)
Glucose-Capillary: 126 mg/dL — ABNORMAL HIGH (ref 70–99)
Glucose-Capillary: 132 mg/dL — ABNORMAL HIGH (ref 70–99)
Glucose-Capillary: 133 mg/dL — ABNORMAL HIGH (ref 70–99)
Glucose-Capillary: 134 mg/dL — ABNORMAL HIGH (ref 70–99)
Glucose-Capillary: 140 mg/dL — ABNORMAL HIGH (ref 70–99)
Glucose-Capillary: 141 mg/dL — ABNORMAL HIGH (ref 70–99)
Glucose-Capillary: 142 mg/dL — ABNORMAL HIGH (ref 70–99)
Glucose-Capillary: 150 mg/dL — ABNORMAL HIGH (ref 70–99)
Glucose-Capillary: 164 mg/dL — ABNORMAL HIGH (ref 70–99)
Glucose-Capillary: 174 mg/dL — ABNORMAL HIGH (ref 70–99)

## 2012-10-23 LAB — CBC
HCT: 29.1 % — ABNORMAL LOW (ref 36.0–46.0)
HCT: 21 % — ABNORMAL LOW (ref 36.0–46.0)
HCT: 31 % — ABNORMAL LOW (ref 36.0–46.0)
Hemoglobin: 10.5 g/dL — ABNORMAL LOW (ref 12.0–15.0)
Hemoglobin: 7.7 g/dL — ABNORMAL LOW (ref 12.0–15.0)
Hemoglobin: 11 g/dL — ABNORMAL LOW (ref 12.0–15.0)
MCH: 31.6 pg (ref 26.0–34.0)
MCH: 30.3 pg (ref 26.0–34.0)
MCH: 29.2 pg (ref 26.0–34.0)
MCHC: 36.1 g/dL — ABNORMAL HIGH (ref 30.0–36.0)
MCHC: 36.7 g/dL — ABNORMAL HIGH (ref 30.0–36.0)
MCHC: 35.5 g/dL (ref 30.0–36.0)
MCV: 87.7 fL (ref 78.0–100.0)
MCV: 82.7 fL (ref 78.0–100.0)
MCV: 82.2 fL (ref 78.0–100.0)
Platelets: 109 10*3/uL — ABNORMAL LOW (ref 150–400)
Platelets: 77 10*3/uL — ABNORMAL LOW (ref 150–400)
Platelets: 80 10*3/uL — ABNORMAL LOW (ref 150–400)
RBC: 3.32 MIL/uL — ABNORMAL LOW (ref 3.87–5.11)
RBC: 2.54 MIL/uL — ABNORMAL LOW (ref 3.87–5.11)
RBC: 3.77 MIL/uL — ABNORMAL LOW (ref 3.87–5.11)
RDW: 12.2 % (ref 11.5–15.5)
RDW: 16.3 % — ABNORMAL HIGH (ref 11.5–15.5)
RDW: 16.4 % — ABNORMAL HIGH (ref 11.5–15.5)
WBC: 18.4 10*3/uL — ABNORMAL HIGH (ref 4.0–10.5)
WBC: 8.7 10*3/uL (ref 4.0–10.5)
WBC: 14.2 10*3/uL — ABNORMAL HIGH (ref 4.0–10.5)

## 2012-10-23 LAB — BASIC METABOLIC PANEL
BUN: 14 mg/dL (ref 6–23)
CO2: 25 mEq/L (ref 19–32)
Calcium: 7.8 mg/dL — ABNORMAL LOW (ref 8.4–10.5)
Chloride: 112 mEq/L (ref 96–112)
Creatinine, Ser: 0.65 mg/dL (ref 0.50–1.10)
GFR calc Af Amer: >90 mL/min (ref >90)
GFR calc non Af Amer: 89 mL/min — ABNORMAL LOW (ref >90)
Glucose, Bld: 133 mg/dL — ABNORMAL HIGH (ref 70–99)
Potassium: 3.5 mEq/L (ref 3.5–5.1)
Sodium: 146 mEq/L — ABNORMAL HIGH (ref 135–145)

## 2012-10-23 LAB — POCT I-STAT, CHEM 8
BUN: 16 mg/dL (ref 6–23)
Calcium, Ion: 1.15 mmol/L (ref 1.13–1.30)
Chloride: 104 mEq/L (ref 96–112)
Creatinine, Ser: 0.7 mg/dL (ref 0.50–1.10)
Glucose, Bld: 166 mg/dL — ABNORMAL HIGH (ref 70–99)
HCT: 32 % — ABNORMAL LOW (ref 36.0–46.0)
Hemoglobin: 10.9 g/dL — ABNORMAL LOW (ref 12.0–15.0)
Potassium: 3.8 mEq/L (ref 3.5–5.1)
Sodium: 142 mEq/L (ref 135–145)
TCO2: 24 mmol/L (ref 0–100)

## 2012-10-23 LAB — POCT I-STAT 3, ART BLOOD GAS (G3+)
Acid-base deficit: 2 mmol/L (ref 0.0–2.0)
Acid-base deficit: 3 mmol/L — ABNORMAL HIGH (ref 0.0–2.0)
Bicarbonate: 23.2 mEq/L (ref 20.0–24.0)
Bicarbonate: 22.7 mEq/L (ref 20.0–24.0)
O2 Saturation: 97 %
O2 Saturation: 93 %
Patient temperature: 37.1
Patient temperature: 36.9
TCO2: 24 mmol/L (ref 0–100)
TCO2: 24 mmol/L (ref 0–100)
pCO2 arterial: 40.5 mmHg (ref 35.0–45.0)
pCO2 arterial: 41.8 mmHg (ref 35.0–45.0)
pH, Arterial: 7.367 (ref 7.350–7.450)
pH, Arterial: 7.342 — ABNORMAL LOW (ref 7.350–7.450)
pO2, Arterial: 90 mmHg (ref 80.0–100.0)
pO2, Arterial: 71 mmHg — ABNORMAL LOW (ref 80.0–100.0)

## 2012-10-23 LAB — PREPARE PLATELET PHERESIS
Unit division: 0
Unit division: 0

## 2012-10-23 LAB — CREATININE, SERUM
Creatinine, Ser: 0.63 mg/dL (ref 0.50–1.10)
GFR calc Af Amer: >90 mL/min (ref >90)
GFR calc non Af Amer: 89 mL/min — ABNORMAL LOW (ref >90)

## 2012-10-23 LAB — MAGNESIUM
Magnesium: 2.9 mg/dL — ABNORMAL HIGH (ref 1.5–2.5)
Magnesium: 2.6 mg/dL — ABNORMAL HIGH (ref 1.5–2.5)

## 2012-10-23 MED ORDER — FUROSEMIDE 10 MG/ML IJ SOLN
20.0000 mg | Freq: Once | INTRAMUSCULAR | Status: AC
  Administered 2012-10-23: 20 mg via INTRAVENOUS

## 2012-10-23 MED ORDER — MUPIROCIN 2 % EX OINT
1.0000 "application " | TOPICAL_OINTMENT | Freq: Two times a day (BID) | CUTANEOUS | Status: DC
  Filled 2012-10-23: qty 22

## 2012-10-23 MED ORDER — POTASSIUM CHLORIDE 10 MEQ/50ML IV SOLN
INTRAVENOUS | Status: AC
  Administered 2012-10-23: 10 meq via INTRAVENOUS
  Filled 2012-10-23: qty 100

## 2012-10-23 MED ORDER — AMIODARONE HCL 200 MG PO TABS
200.0000 mg | ORAL_TABLET | Freq: Two times a day (BID) | ORAL | Status: DC
  Administered 2012-10-23 – 2012-10-26 (×8): 200 mg via ORAL
  Filled 2012-10-23 (×10): qty 1

## 2012-10-23 MED ORDER — FUROSEMIDE 10 MG/ML IJ SOLN
20.0000 mg | Freq: Once | INTRAMUSCULAR | Status: AC
  Administered 2012-10-23: 20 mg via INTRAVENOUS
  Filled 2012-10-23: qty 2

## 2012-10-23 MED ORDER — DIPHENHYDRAMINE HCL 25 MG PO CAPS
25.0000 mg | ORAL_CAPSULE | Freq: Four times a day (QID) | ORAL | Status: DC | PRN
  Administered 2012-10-23: 25 mg via ORAL
  Filled 2012-10-23 (×2): qty 1

## 2012-10-23 MED ORDER — WARFARIN SODIUM 2.5 MG PO TABS
2.5000 mg | ORAL_TABLET | Freq: Every day | ORAL | Status: DC
  Administered 2012-10-23 – 2012-10-25 (×3): 2.5 mg via ORAL
  Filled 2012-10-23 (×4): qty 1

## 2012-10-23 MED ORDER — INSULIN ASPART 100 UNIT/ML ~~LOC~~ SOLN
0.0000 [IU] | SUBCUTANEOUS | Status: DC
  Administered 2012-10-23 (×2): 2 [IU] via SUBCUTANEOUS
  Administered 2012-10-23: 4 [IU] via SUBCUTANEOUS
  Administered 2012-10-23: 2 [IU] via SUBCUTANEOUS

## 2012-10-23 MED ORDER — ATORVASTATIN CALCIUM 10 MG PO TABS
10.0000 mg | ORAL_TABLET | Freq: Every day | ORAL | Status: DC
  Administered 2012-10-23 – 2012-10-28 (×6): 10 mg via ORAL
  Filled 2012-10-23 (×7): qty 1

## 2012-10-23 MED ORDER — MORPHINE SULFATE 2 MG/ML IJ SOLN
2.0000 mg | INTRAMUSCULAR | Status: DC | PRN
  Administered 2012-10-23 (×3): 2 mg via INTRAVENOUS
  Filled 2012-10-23: qty 2
  Filled 2012-10-23 (×2): qty 1

## 2012-10-23 MED ORDER — POTASSIUM CHLORIDE 10 MEQ/50ML IV SOLN
10.0000 meq | INTRAVENOUS | Status: AC
  Administered 2012-10-23 (×3): 10 meq via INTRAVENOUS
  Filled 2012-10-23: qty 100
  Filled 2012-10-23: qty 50

## 2012-10-23 MED ORDER — POTASSIUM CHLORIDE 10 MEQ/50ML IV SOLN
10.0000 meq | INTRAVENOUS | Status: AC
  Administered 2012-10-23 (×3): 10 meq via INTRAVENOUS
  Filled 2012-10-23: qty 50
  Filled 2012-10-23: qty 100

## 2012-10-23 MED ORDER — WARFARIN - PHYSICIAN DOSING INPATIENT: Freq: Every day | Status: DC

## 2012-10-23 MED ORDER — POTASSIUM CHLORIDE 10 MEQ/50ML IV SOLN
10.0000 meq | INTRAVENOUS | Status: AC
  Administered 2012-10-23 (×2): 10 meq via INTRAVENOUS

## 2012-10-23 MED ORDER — FUROSEMIDE 10 MG/ML IJ SOLN
20.0000 mg | Freq: Once | INTRAMUSCULAR | Status: DC
  Filled 2012-10-23: qty 2

## 2012-10-23 MED FILL — Sodium Chloride IV Soln 0.9%: INTRAVENOUS | Qty: 1000 | Status: AC

## 2012-10-23 MED FILL — Potassium Chloride Inj 2 mEq/ML: INTRAVENOUS | Qty: 40 | Status: AC

## 2012-10-23 MED FILL — Magnesium Sulfate Inj 50%: INTRAMUSCULAR | Qty: 10 | Status: AC

## 2012-10-23 MED FILL — Heparin Sodium (Porcine) Inj 1000 Unit/ML: INTRAMUSCULAR | Qty: 30 | Status: AC

## 2012-10-23 NOTE — Progress Notes (Signed)
   CARDIOTHORACIC SURGERY PROGRESS NOTE   R1 Day Post-Op Procedure(s) (LRB): CORONARY ARTERY BYPASS GRAFTING (CABG) (N/A) MAZE (N/A) INTRAOPERATIVE TRANSESOPHAGEAL ECHOCARDIOGRAM (N/A)  Subjective: Looks good.  Minimal soreness in chest.  Breathing comfortably.  Objective: Vital signs: BP Readings from Last 1 Encounters:  10/23/12 96/48   Pulse Readings from Last 1 Encounters:  10/23/12 81   Resp Readings from Last 1 Encounters:  10/23/12 21   Temp Readings from Last 1 Encounters:  10/23/12 99 F (37.2 C)     Hemodynamics: PAP: (22-49)/(11-30) 35/11 mmHg CO:  [2 L/min-4.7 L/min] 2.1 L/min CI:  [1.2 L/min/m2-3.6 L/min/m2] 3.6 L/min/m2  Physical Exam:  Rhythm:   Sinus - AAI paced  Breath sounds: clear  Heart sounds:  RRR  Incisions:  Dressings dry, intact  Abdomen:  soft  Extremities:  warm   Intake/Output from previous day: 04/30 0701 - 05/01 0700 In: 9568.7 [I.V.:5289.9; Blood:2313.8; IV Piggyback:1965] Out: 4835 [Urine:4265; Chest Tube:570] Intake/Output this shift: Total I/O In: 140 [I.V.:40; IV Piggyback:100] Out: 70 [Urine:40; Chest Tube:30]  Lab Results:  Recent Labs  10/22/12 2019 10/22/12 2219 10/23/12 0415  WBC 15.9*  --  8.7  HGB 6.7* 6.8* 7.7*  HCT 18.7* 20.0* 21.0*  PLT 120*  --  77*   BMET:  Recent Labs  10/21/12 1509  10/22/12 2219 10/23/12 0415  NA 139  < > 145 146*  K 3.3*  < > 4.0 3.5  CL 100  --  109 112  CO2 28  --   --  25  GLUCOSE 110*  < > 146* 133*  BUN 19  --  14 14  CREATININE 0.81  < > 0.70 0.65  CALCIUM 9.4  --   --  7.8*  < > = values in this interval not displayed.  CBG (last 3)   Recent Labs  10/23/12 0130 10/23/12 0428 10/23/12 0759  GLUCAP 174* 132* 126*   ABG    Component Value Date/Time   PHART 7.342* 10/23/2012 0429   HCO3 22.7 10/23/2012 0429   TCO2 24 10/23/2012 0429   ACIDBASEDEF 3.0* 10/23/2012 0429   O2SAT 93.0 10/23/2012 0429   CXR: *RADIOLOGY REPORT*  Clinical Data: Status post thoracic  surgery.  PORTABLE CHEST - 1 VIEW  Comparison: October 22, 2012.  Findings: Endotracheal tube and nasogastric tube have been removed.  Right internal jugular Swan-Ganz catheter remains with tip in  expected position of the main pulmonary artery. Two left-sided  chest tubes are again noted and unchanged in position with no  evidence of pneumothorax. No change is seen in the focal linear  scarring or subsegmental atelectasis seen in left mid lung. Lung  is clear.  IMPRESSION:  Two left-sided chest tubes remain without evidence of pneumothorax.  Endotracheal and nasogastric tubes have been removed.  Original Report Authenticated By: Lupita Raider., M.D.    Assessment/Plan: S/P Procedure(s) (LRB): CORONARY ARTERY BYPASS GRAFTING (CABG) (N/A) MAZE (N/A) INTRAOPERATIVE TRANSESOPHAGEAL ECHOCARDIOGRAM (N/A)  Doing well POD1 Maintaining NSR w/ stable hemodynamics off drips Expected post op acute blood loss anemia, stable overnight but Hgb 7.1 and patient w/ some orthostatic symptoms Expected post op volume excess, mild    Transfuse PRBC's  Start diuretics  Mobilize  Resume amiodarone  Start coumadin   Claudia Howell H 10/23/2012 8:57 AM

## 2012-10-23 NOTE — Procedures (Signed)
Extubation Procedure Note  Patient Details:   Name: Claudia Howell DOB: Sep 18, 1940 MRN: [id number]   Airway Documentation:     Evaluation  O2 sats: stable throughout and currently acceptable Complications: No apparent complications Patient did tolerate procedure well. Bilateral Breath Sounds: Clear Suctioning: Airway Yes  PT was extubated to 4 lpm via rapid wean protocol. Pts VC was close to 800 ml and nif -20. Clear ability to speak with no stridor noted. Positive cuff leak. No apparent complications.   HUDSON, Joseandres Mazer N 10/23/2012, 1:53 AM

## 2012-10-23 NOTE — Progress Notes (Signed)
[facility] BRIEF SICU PROGRESS NOTE  1 Day Post-Op  S/P Procedure(s) (LRB): CORONARY ARTERY BYPASS GRAFTING (CABG) (N/A) MAZE (N/A) INTRAOPERATIVE TRANSESOPHAGEAL ECHOCARDIOGRAM (N/A)   Stable day NSR/AAI paced w/ stable BP Diuresing well Hgb increased to 11.0  Plan: Continue routine care  Shalin Vonbargen H 10/23/2012 6:21 PM

## 2012-10-23 NOTE — Progress Notes (Signed)
Awake and sitting at bedside. ECG nonischemic s/p CABG x 3 with Maze.

## 2012-10-23 NOTE — Anesthesia Postprocedure Evaluation (Signed)
  Anesthesia Post-op Note  Patient: Social worker  Procedure(s) Performed: Procedure(s): CORONARY ARTERY BYPASS GRAFTING (CABG) (N/A) MAZE (N/A) INTRAOPERATIVE TRANSESOPHAGEAL ECHOCARDIOGRAM (N/A)  Patient Location: SICU  Anesthesia Type:General  Level of Consciousness: sedated and Patient remains intubated per anesthesia plan  Airway and Oxygen Therapy: Patient remains intubated per anesthesia plan and Patient placed on Ventilator (see vital sign flow sheet for setting)  Post-op Pain: none  Post-op Assessment: Post-op Vital signs reviewed, Patient's Cardiovascular Status Stable, Respiratory Function Stable, Patent Airway, No signs of Nausea or vomiting and Pain level controlled  Post-op Vital Signs: stable  Complications: No apparent anesthesia complications

## 2012-10-24 ENCOUNTER — Encounter (HOSPITAL_COMMUNITY): Payer: Self-pay | Admitting: General Practice

## 2012-10-24 ENCOUNTER — Inpatient Hospital Stay (HOSPITAL_COMMUNITY): Payer: Medicare Other

## 2012-10-24 LAB — PROTIME-INR
INR: 1.28 (ref 0.00–1.49)
Prothrombin Time: 15.7 seconds — ABNORMAL HIGH (ref 11.6–15.2)

## 2012-10-24 LAB — TYPE AND SCREEN
ABO/RH(D): A NEG
Antibody Screen: NEGATIVE
Unit division: 0
Unit division: 0
Unit division: 0
Unit division: 0

## 2012-10-24 LAB — BASIC METABOLIC PANEL
BUN: 18 mg/dL (ref 6–23)
CO2: 28 mEq/L (ref 19–32)
Calcium: 8.1 mg/dL — ABNORMAL LOW (ref 8.4–10.5)
Chloride: 102 mEq/L (ref 96–112)
Creatinine, Ser: 0.7 mg/dL (ref 0.50–1.10)
GFR calc Af Amer: >90 mL/min (ref >90)
GFR calc non Af Amer: 86 mL/min — ABNORMAL LOW (ref >90)
Glucose, Bld: 122 mg/dL — ABNORMAL HIGH (ref 70–99)
Potassium: 3.7 mEq/L (ref 3.5–5.1)
Sodium: 138 mEq/L (ref 135–145)

## 2012-10-24 LAB — CBC
HCT: 29.2 % — ABNORMAL LOW (ref 36.0–46.0)
Hemoglobin: 10.6 g/dL — ABNORMAL LOW (ref 12.0–15.0)
MCH: 29.9 pg (ref 26.0–34.0)
MCHC: 36.3 g/dL — ABNORMAL HIGH (ref 30.0–36.0)
MCV: 82.5 fL (ref 78.0–100.0)
Platelets: 85 10*3/uL — ABNORMAL LOW (ref 150–400)
RBC: 3.54 MIL/uL — ABNORMAL LOW (ref 3.87–5.11)
RDW: 16.5 % — ABNORMAL HIGH (ref 11.5–15.5)
WBC: 16.9 10*3/uL — ABNORMAL HIGH (ref 4.0–10.5)

## 2012-10-24 LAB — GLUCOSE, CAPILLARY
Glucose-Capillary: 133 mg/dL — ABNORMAL HIGH (ref 70–99)
Glucose-Capillary: 120 mg/dL — ABNORMAL HIGH (ref 70–99)

## 2012-10-24 MED ORDER — FUROSEMIDE 40 MG PO TABS: 40.0000 mg | ORAL_TABLET | Freq: Every day | ORAL | Status: DC

## 2012-10-24 MED ORDER — POTASSIUM CHLORIDE CRYS ER 20 MEQ PO TBCR
20.0000 meq | EXTENDED_RELEASE_TABLET | Freq: Two times a day (BID) | ORAL | Status: DC
  Administered 2012-10-25: 20 meq via ORAL
  Filled 2012-10-24 (×4): qty 1

## 2012-10-24 MED ORDER — GLUCERNA SHAKE PO LIQD
237.0000 mL | Freq: Every day | ORAL | Status: DC
  Administered 2012-10-25 – 2012-10-28 (×4): 237 mL via ORAL

## 2012-10-24 MED ORDER — POTASSIUM CHLORIDE 10 MEQ/50ML IV SOLN
10.0000 meq | INTRAVENOUS | Status: AC
  Administered 2012-10-24 (×3): 10 meq via INTRAVENOUS
  Filled 2012-10-24 (×2): qty 150

## 2012-10-24 MED ORDER — ATENOLOL 12.5 MG HALF TABLET
12.5000 mg | ORAL_TABLET | Freq: Two times a day (BID) | ORAL | Status: DC
  Administered 2012-10-24 – 2012-10-25 (×4): 12.5 mg via ORAL
  Filled 2012-10-24 (×6): qty 1

## 2012-10-24 MED ORDER — TRAMADOL HCL 50 MG PO TABS
50.0000 mg | ORAL_TABLET | ORAL | Status: DC | PRN
  Administered 2012-10-24 – 2012-10-27 (×9): 100 mg via ORAL
  Administered 2012-10-28: 50 mg via ORAL
  Administered 2012-10-28 – 2012-10-29 (×2): 100 mg via ORAL
  Filled 2012-10-24 (×2): qty 2
  Filled 2012-10-24: qty 1
  Filled 2012-10-24 (×3): qty 2
  Filled 2012-10-24: qty 1
  Filled 2012-10-24 (×6): qty 2

## 2012-10-24 MED ORDER — ASPIRIN EC 81 MG PO TBEC
81.0000 mg | DELAYED_RELEASE_TABLET | Freq: Every day | ORAL | Status: DC
  Administered 2012-10-24 – 2012-10-29 (×6): 81 mg via ORAL
  Filled 2012-10-24 (×6): qty 1

## 2012-10-24 MED ORDER — BENAZEPRIL HCL 10 MG PO TABS
10.0000 mg | ORAL_TABLET | Freq: Every day | ORAL | Status: DC
  Administered 2012-10-24 – 2012-10-26 (×3): 10 mg via ORAL
  Filled 2012-10-24 (×4): qty 1

## 2012-10-24 MED ORDER — ASPIRIN EC 81 MG PO TBEC: 81.0000 mg | DELAYED_RELEASE_TABLET | Freq: Every day | ORAL | Status: DC

## 2012-10-24 MED ORDER — FUROSEMIDE 10 MG/ML IJ SOLN
20.0000 mg | Freq: Four times a day (QID) | INTRAMUSCULAR | Status: AC
  Administered 2012-10-24 (×3): 20 mg via INTRAVENOUS
  Filled 2012-10-24 (×3): qty 2

## 2012-10-24 MED ORDER — MIDAZOLAM HCL 2 MG/2ML IJ SOLN
2.0000 mg | Freq: Once | INTRAMUSCULAR | Status: AC
  Administered 2012-10-24: 2 mg via INTRAVENOUS
  Filled 2012-10-24: qty 2

## 2012-10-24 MED ORDER — MOVING RIGHT ALONG BOOK
Freq: Once | Status: AC
  Administered 2012-10-24: 09:00:00
  Filled 2012-10-24: qty 1

## 2012-10-24 MED ORDER — SODIUM CHLORIDE 0.9 % IJ SOLN
3.0000 mL | INTRAMUSCULAR | Status: DC | PRN
  Administered 2012-10-27: 3 mL via INTRAVENOUS

## 2012-10-24 MED ORDER — SODIUM CHLORIDE 0.9 % IV SOLN: 250.0000 mL | INTRAVENOUS | Status: DC | PRN

## 2012-10-24 MED ORDER — SODIUM CHLORIDE 0.9 % IJ SOLN
3.0000 mL | Freq: Two times a day (BID) | INTRAMUSCULAR | Status: DC
  Administered 2012-10-24 – 2012-10-28 (×7): 3 mL via INTRAVENOUS

## 2012-10-24 MED ORDER — ALPRAZOLAM 0.25 MG PO TABS: 0.2500 mg | ORAL_TABLET | Freq: Four times a day (QID) | ORAL | Status: DC | PRN

## 2012-10-24 MED ORDER — FUROSEMIDE 10 MG/ML IJ SOLN
INTRAMUSCULAR | Status: AC
  Filled 2012-10-24: qty 4

## 2012-10-24 MED ORDER — FUROSEMIDE 40 MG PO TABS
40.0000 mg | ORAL_TABLET | Freq: Two times a day (BID) | ORAL | Status: DC
  Administered 2012-10-25 – 2012-10-29 (×8): 40 mg via ORAL
  Filled 2012-10-24 (×11): qty 1

## 2012-10-24 MED ORDER — POTASSIUM CHLORIDE CRYS ER 20 MEQ PO TBCR: 20.0000 meq | EXTENDED_RELEASE_TABLET | Freq: Every day | ORAL | Status: DC

## 2012-10-24 NOTE — Progress Notes (Signed)
Progressing nicely.  No arrhythmia.  We'll continue to follow and support as needed.

## 2012-10-24 NOTE — Progress Notes (Signed)
INITIAL NUTRITION ASSESSMENT  DOCUMENTATION CODES Per approved criteria  -Not Applicable   INTERVENTION:  Glucerna Shake daily (220 kcals, 9.9 gm protein per 8 fl oz bottle) RD to follow for nutrition care plan  NUTRITION DIAGNOSIS: Increased nutrient needs related to wound & post-op healing as evidenced by estimated nutrition needs  Goal: Oral intake with meals & supplements to meet >/= 90% of estimated nutrition needs  Monitor:  PO & supplemental intake, weight, labs, I/O's  Reason for Assessment: Low Braden, wound  72 y.o. female  Admitting Dx: S/P CABG x 3  ASSESSMENT: Patient presented with 11 month history of intermittent substernal chest discomfort radiating down her arms occuring with physical activity; s/p cardiac cath 4/24.  Patient s/p procedure 5/10: CORONARY ARTERY BYPASS GRAFTING MAZE PROCEDURE  Patient sleeping in room; cloth over eyes; RD did not disturb; no % PO intake per flowsheet records; would benefit from nutrition supplement to help meet protein needs ---> RD to order.  Height: Ht Readings from Last 1 Encounters:  10/22/12 5\' 6"  (1.676 m)    Weight: Wt Readings from Last 1 Encounters:  10/23/12 153 lb 3.5 oz (69.5 kg)    Ideal Body Weight: 130 lb  % Ideal Body Weight: 85%  Wt Readings from Last 10 Encounters:  10/23/12 153 lb 3.5 oz (69.5 kg)  10/23/12 153 lb 3.5 oz (69.5 kg)  10/21/12 143 lb 1.6 oz (64.91 kg)  10/18/12 145 lb 11.6 oz (66.1 kg)  10/18/12 145 lb 11.6 oz (66.1 kg)  10/06/12 144 lb (65.318 kg)  09/26/11 139 lb (63.05 kg)    Usual Body Weight: 145 lb  % Usual Body Weight: 105%  BMI:  Body mass index is 24.74 kg/(m^2).  Estimated Nutritional Needs: Kcal: 1700-1900 Protein: 85-95 gm Fluid: 1.7-1.9 L  Skin: Stage II pressure ulcer to coccyx   Diet Order: Carb Control  EDUCATION NEEDS: -No education needs identified at this time   Intake/Output Summary (Last 24 hours) at 10/24/12 1202 Last data filed at  10/24/12 1051  Gross per 24 hour  Intake 1152.5 ml  Output   1925 ml  Net -772.5 ml    Labs:   Recent Labs Lab 10/21/12 1509  10/22/12 2100  10/23/12 0415 10/23/12 1700 10/23/12 1714 10/24/12 0355  NA 139  < >  --   < > 146*  --  142 138  K 3.3*  < >  --   < > 3.5  --  3.8 3.7  CL 100  --   --   < > 112  --  104 102  CO2 28  --   --   --  25  --   --  28  BUN 19  --   --   < > 14  --  16 18  CREATININE 0.81  --  0.65  < > 0.65 0.63 0.70 0.70  CALCIUM 9.4  --   --   --  7.8*  --   --  8.1*  MG  --   --  3.3*  --  2.9* 2.6*  --   --   GLUCOSE 110*  < >  --   < > 133*  --  166* 122*  < > = values in this interval not displayed.  CBG (last 3)   Recent Labs  10/23/12 1943 10/23/12 2349 10/24/12 0757  GLUCAP 140* 133* 133*    Scheduled Meds: . acetaminophen  1,000 mg Oral Q6H  . amiodarone  200 mg Oral BID  . aspirin EC  81 mg Oral Daily  . atenolol  12.5 mg Oral BID  . atorvastatin  10 mg Oral q1800  . benazepril  10 mg Oral Daily  . bisacodyl  10 mg Oral Daily   Or  . bisacodyl  10 mg Rectal Daily  . Chlorhexidine Gluconate Cloth  6 each Topical Q0600  . docusate sodium  200 mg Oral Daily  . furosemide  20 mg Intravenous Q6H  . [START ON 10/25/2012] furosemide  40 mg Oral BID  . moving right along book   Does not apply Once  . mupirocin ointment  1 application Nasal BID  . pantoprazole  40 mg Oral Daily  . [START ON 10/25/2012] potassium chloride  20 mEq Oral BID  . sodium chloride  3 mL Intravenous Q12H  . sodium chloride  3 mL Intravenous Q12H  . warfarin  2.5 mg Oral q1800  . Warfarin - Physician Dosing Inpatient   Does not apply q1800    Continuous Infusions:   Past Medical History  Diagnosis Date  . Hypertension     Dr.Henry Katrinka Blazing  . Hyperlipidemia   . Osteopenia 9/09  . Adenomatous colon polyp 10/09  . Chest pain on exertion     "escalating over last few months" (10/16/2012)  . Coronary artery disease   . PAF (paroxysmal atrial fibrillation)      during cath/notes 10/16/2012  . Decubitus ulcer of coccyx 10/22/12    1/2 inch raw open area on coccyx  . S/P CABG x 3 10/22/2012    LIMA to LAD, SVG to distal RCA, SVG to OM, EVH from right thigh  . S/P Maze operation for atrial fibrillation 10/22/2012    Left side lesion set using bipolar radiofrequency and cryothermy ablation with clipping of LA appendage    Past Surgical History  Procedure Laterality Date  . Total abdominal hysterectomy w/ bilateral salpingoophorectomy  1990    benign growth  . Cardiac catheterization  10/16/2012  . Tonsillectomy  1949  . Abdominal hysterectomy    . Breast cyst excision Left 1980-1990's    x 3    Maureen Chatters, RD, LDN Pager #: [phone] After-Hours Pager #: [phone]

## 2012-10-24 NOTE — Progress Notes (Signed)
Pt refused second walk today because she feels too dizzy/tired/nauseated. Offered medication for the nausea, however pt wants to just rest. Informed pt on the importance of walking. Will allow her to rest for now and encourage walks later.

## 2012-10-24 NOTE — Progress Notes (Addendum)
   CARDIOTHORACIC SURGERY PROGRESS NOTE   R2 Days Post-Op Procedure(s) (LRB): CORONARY ARTERY BYPASS GRAFTING (CABG) (N/A) MAZE (N/A) INTRAOPERATIVE TRANSESOPHAGEAL ECHOCARDIOGRAM (N/A)  Subjective: Feels better.  Mild soreness in chest.  Reports feeling itchy but otherwise no complaints.  Objective: Vital signs: BP Readings from Last 1 Encounters:  10/24/12 137/65   Pulse Readings from Last 1 Encounters:  10/24/12 92   Resp Readings from Last 1 Encounters:  10/24/12 31   Temp Readings from Last 1 Encounters:  10/24/12 98.6 F (37 C) Oral    Hemodynamics: PAP: (35-43)/(11-14) 43/13 mmHg  Physical Exam:  Rhythm:   sinus  Breath sounds: clear  Heart sounds:  RRR  Incisions:  Clean and dry  Abdomen:  soft  Extremities:  warm   Intake/Output from previous day: 05/01 0701 - 05/02 0700 In: 1835 [I.V.:530; Blood:805; IV Piggyback:500] Out: 1610 [Urine:1955; Chest Tube:490] Intake/Output this shift:    Lab Results:  Recent Labs  10/23/12 1700 10/23/12 1714 10/24/12 0355  WBC 14.2*  --  16.9*  HGB 11.0* 10.9* 10.6*  HCT 31.0* 32.0* 29.2*  PLT 80*  --  85*   BMET:  Recent Labs  10/23/12 0415  10/23/12 1714 10/24/12 0355  NA 146*  --  142 138  K 3.5  --  3.8 3.7  CL 112  --  104 102  CO2 25  --   --  28  GLUCOSE 133*  --  166* 122*  BUN 14  --  16 18  CREATININE 0.65  < > 0.70 0.70  CALCIUM 7.8*  --   --  8.1*  < > = values in this interval not displayed.  CBG (last 3)   Recent Labs  10/23/12 1637 10/23/12 1943 10/23/12 2349  GLUCAP 164* 140* 133*   ABG    Component Value Date/Time   PHART 7.342* 10/23/2012 0429   HCO3 22.7 10/23/2012 0429   TCO2 24 10/23/2012 1714   ACIDBASEDEF 3.0* 10/23/2012 0429   O2SAT 93.0 10/23/2012 0429   CXR: *RADIOLOGY REPORT*  Clinical Data: Post CABG  PORTABLE CHEST - 1 VIEW  Comparison: Portable exam 0622 hours compared to 10/23/2012  Findings:  Swan-Ganz catheter removed.  Mediastinal drains and left  thoracostomy tube remain.  Right jugular central venous catheter with tip projecting over SVC.  Minimal enlargement of cardiac silhouette post CABG and left atrial  appendage clip placement.  Atherosclerotic calcification aorta.  Mild perihilar infiltrate on right question edema.  Small right pleural effusion and minimal right basilar atelectasis.  No pneumothorax or acute osseous findings.  IMPRESSION:  Right pleural effusion with basilar atelectasis and question  minimal perihilar edema.  Original Report Authenticated By: Ulyses Southward, M.D.    Assessment/Plan: S/P Procedure(s) (LRB): CORONARY ARTERY BYPASS GRAFTING (CABG) (N/A) MAZE (N/A) INTRAOPERATIVE TRANSESOPHAGEAL ECHOCARDIOGRAM (N/A)  Doing well POD2 Maintaining NSR w/ stable BP Expected post op acute blood loss anemia, mild, improved Expected post op volume excess, mild, diuresing Expected post op atelectasis R>L Marginal O2 saturations   Mobilize and pulm toilet  Diuresis  D/C tubes and lines  Continue amiodarone and coumadin  Restart atenolol and lotensin at reduced doses for now  Transfer step down   Claudia Howell 10/24/2012 8:14 AM

## 2012-10-24 NOTE — Progress Notes (Signed)
Received pt from 2300. Pt is stable and resting in bed with call bell in reach. Per report, pt has already walked 250 feet on 2300 today.

## 2012-10-25 ENCOUNTER — Inpatient Hospital Stay (HOSPITAL_COMMUNITY): Payer: Medicare Other

## 2012-10-25 LAB — BASIC METABOLIC PANEL
BUN: 19 mg/dL (ref 6–23)
CO2: 31 mEq/L (ref 19–32)
Calcium: 8.3 mg/dL — ABNORMAL LOW (ref 8.4–10.5)
Chloride: 96 mEq/L (ref 96–112)
Creatinine, Ser: 0.64 mg/dL (ref 0.50–1.10)
GFR calc Af Amer: >90 mL/min (ref >90)
GFR calc non Af Amer: 89 mL/min — ABNORMAL LOW (ref >90)
Glucose, Bld: 115 mg/dL — ABNORMAL HIGH (ref 70–99)
Potassium: 3.7 mEq/L (ref 3.5–5.1)
Sodium: 133 mEq/L — ABNORMAL LOW (ref 135–145)

## 2012-10-25 LAB — PROTIME-INR
INR: 1.75 — ABNORMAL HIGH (ref 0.00–1.49)
Prothrombin Time: 19.8 seconds — ABNORMAL HIGH (ref 11.6–15.2)

## 2012-10-25 LAB — CBC
HCT: 26.5 % — ABNORMAL LOW (ref 36.0–46.0)
Hemoglobin: 9.3 g/dL — ABNORMAL LOW (ref 12.0–15.0)
MCH: 29.7 pg (ref 26.0–34.0)
MCHC: 35.1 g/dL (ref 30.0–36.0)
MCV: 84.7 fL (ref 78.0–100.0)
Platelets: 88 10*3/uL — ABNORMAL LOW (ref 150–400)
RBC: 3.13 MIL/uL — ABNORMAL LOW (ref 3.87–5.11)
RDW: 15.6 % — ABNORMAL HIGH (ref 11.5–15.5)
WBC: 14.5 10*3/uL — ABNORMAL HIGH (ref 4.0–10.5)

## 2012-10-25 LAB — GLUCOSE, CAPILLARY: Glucose-Capillary: 112 mg/dL — ABNORMAL HIGH (ref 70–99)

## 2012-10-25 MED ORDER — SENNA 8.6 MG PO TABS
1.0000 | ORAL_TABLET | Freq: Two times a day (BID) | ORAL | Status: DC | PRN
  Administered 2012-10-25 – 2012-10-27 (×2): 8.6 mg via ORAL
  Filled 2012-10-25 (×2): qty 1

## 2012-10-25 MED ORDER — MAGNESIUM HYDROXIDE 400 MG/5ML PO SUSP
15.0000 mL | Freq: Every day | ORAL | Status: DC | PRN
  Administered 2012-10-27 – 2012-10-28 (×2): 15 mL via ORAL
  Filled 2012-10-25 (×3): qty 30

## 2012-10-25 NOTE — Progress Notes (Signed)
Pt does not wish to walk due to feeling nauseous.  Notably no BM since surgery.  Senakot given per PRN order, as well as ice chips for comfort.  Will con't plan of care.

## 2012-10-25 NOTE — Progress Notes (Addendum)
                    [address]            [place] [address]          [phone]     3 Days Post-Op Procedure(s) (LRB): CORONARY ARTERY BYPASS GRAFTING (CABG) (N/A) MAZE (N/A) INTRAOPERATIVE TRANSESOPHAGEAL ECHOCARDIOGRAM (N/A)  Subjective: Feels well, no complaints.   Objective: Vital signs in last 24 hours: Patient Vitals for the past 24 hrs:  BP Temp Temp src Pulse Resp SpO2 Weight  10/25/12 0541 120/71 mmHg 98.4 F (36.9 C) Oral 72 19 92 % 152 lb (68.947 kg)  10/24/12 1957 101/56 mmHg 98.5 F (36.9 C) Oral 73 20 93 % -  10/24/12 1830 106/58 mmHg - - - - - -  10/24/12 1454 101/59 mmHg - - 72 18 94 % -  10/24/12 1416 92/51 mmHg - - 74 20 96 % -  10/24/12 1407 102/47 mmHg - - 74 26 96 % -  10/24/12 1406 83/46 mmHg - - - 22 96 % -  10/24/12 1400 81/64 mmHg - - - 20 96 % -  10/24/12 1300 100/56 mmHg - - 82 22 93 % -  10/24/12 1200 120/58 mmHg - - 74 23 93 % -  10/24/12 1140 - 98.6 F (37 C) Oral - - - -  10/24/12 1100 112/51 mmHg - - 80 32 92 % -  10/24/12 1000 126/63 mmHg - - 82 23 94 % -  10/24/12 0900 120/60 mmHg - - 78 22 94 % -   Current Weight  10/25/12 152 lb (68.947 kg)   PRE OP WEIGHT: 65 kg   Intake/Output from previous day: 05/02 0701 - 05/03 0700 In: 1225 [P.O.:1015; I.V.:60; IV Piggyback:150] Out: 1980 [Urine:1980]    PHYSICAL EXAM:  Heart: RRR Lungs: Clear Wound: Clean and dry Extremities: Mild LE edema    Lab Results: CBC: Recent Labs  10/24/12 0355 10/25/12 0510  WBC 16.9* 14.5*  HGB 10.6* 9.3*  HCT 29.2* 26.5*  PLT 85* 88*   BMET:  Recent Labs  10/24/12 0355 10/25/12 0510  NA 138 133*  K 3.7 3.7  CL 102 96  CO2 28 31  GLUCOSE 122* 115*  BUN 18 19  CREATININE 0.70 0.64  CALCIUM 8.1* 8.3*    PT/INR:  Recent Labs  10/25/12 0510  LABPROT 19.8*  INR 1.75*    CXR: stable  Assessment/Plan: S/P Procedure(s) (LRB): CORONARY ARTERY BYPASS GRAFTING (CABG) (N/A) MAZE (N/A) INTRAOPERATIVE  TRANSESOPHAGEAL ECHOCARDIOGRAM (N/A)  CV- BPs low normal, maintaining SR.  Continue current meds.  Vol overload- diurese.  Expected postop blood loss anemia- stable.  Thrombocytopenia- plts low but stable. Monitor.  CRPI, pulm toilet, LOC today.   LOS: 3 days    COLLINS,GINA H 10/25/2012  I have seen and examined Claudia Howell and agree with the above assessment  and plan.  Delight Ovens MD Beeper [phone] Office [phone] 10/25/2012 11:11 AM

## 2012-10-25 NOTE — Progress Notes (Signed)
CARDIAC REHAB PHASE I   PRE:  Rate/Rhythm: 75 sinus rhythm  BP:  Supine:   Sitting: 126/71  Standing:    SaO2: 91% 3L  MODE:  Ambulation: 80 ft   POST:  Rate/Rhythem: 87 sinus rhythm  BP:  Supine:   Sitting: 124/67  Standing:    SaO2: 90% 2L   Pt ambulated in hallway x2 assist using rolling walker.  Slow steady gait.  Pt c/o nausea which limited ambulation.  Pt returned to bed call light in reach.  Pt O2 sat dropped to 88% on 2L up to 94% with 3L and PLB. Pt educated on importance of IS use and pulmonary toilet.   % Ulisses Vondrak, Wynonia Musty

## 2012-10-26 LAB — BASIC METABOLIC PANEL
BUN: 19 mg/dL (ref 6–23)
CO2: 30 mEq/L (ref 19–32)
Calcium: 8.1 mg/dL — ABNORMAL LOW (ref 8.4–10.5)
Chloride: 95 mEq/L — ABNORMAL LOW (ref 96–112)
Creatinine, Ser: 0.56 mg/dL (ref 0.50–1.10)
GFR calc Af Amer: >90 mL/min (ref >90)
GFR calc non Af Amer: >90 mL/min (ref >90)
Glucose, Bld: 112 mg/dL — ABNORMAL HIGH (ref 70–99)
Potassium: 2.9 mEq/L — ABNORMAL LOW (ref 3.5–5.1)
Sodium: 134 mEq/L — ABNORMAL LOW (ref 135–145)

## 2012-10-26 LAB — CBC
HCT: 26 % — ABNORMAL LOW (ref 36.0–46.0)
Hemoglobin: 9 g/dL — ABNORMAL LOW (ref 12.0–15.0)
MCH: 29.6 pg (ref 26.0–34.0)
MCHC: 34.6 g/dL (ref 30.0–36.0)
MCV: 85.5 fL (ref 78.0–100.0)
Platelets: 120 10*3/uL — ABNORMAL LOW (ref 150–400)
RBC: 3.04 MIL/uL — ABNORMAL LOW (ref 3.87–5.11)
RDW: 14.9 % (ref 11.5–15.5)
WBC: 11 10*3/uL — ABNORMAL HIGH (ref 4.0–10.5)

## 2012-10-26 LAB — PROTIME-INR
INR: 2.64 — ABNORMAL HIGH (ref 0.00–1.49)
Prothrombin Time: 26.9 seconds — ABNORMAL HIGH (ref 11.6–15.2)

## 2012-10-26 MED ORDER — WARFARIN VIDEO
Freq: Once | Status: AC
  Administered 2012-10-26: 18:00:00

## 2012-10-26 MED ORDER — POTASSIUM CHLORIDE CRYS ER 20 MEQ PO TBCR
20.0000 meq | EXTENDED_RELEASE_TABLET | Freq: Three times a day (TID) | ORAL | Status: DC
  Administered 2012-10-26 (×3): 20 meq via ORAL
  Filled 2012-10-26 (×4): qty 1

## 2012-10-26 MED ORDER — COUMADIN BOOK
Freq: Once | Status: AC
  Administered 2012-10-26: 18:00:00
  Filled 2012-10-26: qty 1

## 2012-10-26 MED ORDER — ATENOLOL 25 MG PO TABS
25.0000 mg | ORAL_TABLET | Freq: Two times a day (BID) | ORAL | Status: DC
  Administered 2012-10-26 – 2012-10-28 (×6): 25 mg via ORAL
  Filled 2012-10-26 (×8): qty 1

## 2012-10-26 NOTE — Progress Notes (Addendum)
                    [address]            [place] [address]          [phone]     4 Days Post-Op Procedure(s) (LRB): CORONARY ARTERY BYPASS GRAFTING (CABG) (N/A) MAZE (N/A) INTRAOPERATIVE TRANSESOPHAGEAL ECHOCARDIOGRAM (N/A)  Subjective: Nauseated, not feeling well this am.  Still no BM.    Objective: Vital signs in last 24 hours: Patient Vitals for the past 24 hrs:  BP Temp Temp src Pulse Resp SpO2 Weight  10/26/12 0457 142/84 mmHg 98.5 F (36.9 C) Oral 68 20 95 % 150 lb 1.6 oz (68.085 kg)  10/25/12 2032 115/67 mmHg 98.5 F (36.9 C) Oral 76 21 96 % -  10/25/12 1320 130/65 mmHg 98.6 F (37 C) Oral 74 18 96 % -   Current Weight  10/26/12 150 lb 1.6 oz (68.085 kg)  PRE OP WEIGHT: 65 kg       Intake/Output from previous day: 05/03 0701 - 05/04 0700 In: -  Out: 1650 [Urine:1650]    PHYSICAL EXAM:  Heart:RRR Lungs:Clear Wound: Clean and dry Abdomen: soft, NT/ND, +BS, slightly decreased Extremities: Trace LE edema    Lab Results: CBC: Recent Labs  10/25/12 0510 10/26/12 0545  WBC 14.5* 11.0*  HGB 9.3* 9.0*  HCT 26.5* 26.0*  PLT 88* 120*   BMET:  Recent Labs  10/25/12 0510 10/26/12 0545  NA 133* 134*  K 3.7 2.9*  CL 96 95*  CO2 31 30  GLUCOSE 115* 112*  BUN 19 19  CREATININE 0.64 0.56  CALCIUM 8.3* 8.1*    PT/INR:  Recent Labs  10/26/12 0545  LABPROT 26.9*  INR 2.64*      Assessment/Plan: S/P Procedure(s) (LRB): CORONARY ARTERY BYPASS GRAFTING (CABG) (N/A) MAZE (N/A) INTRAOPERATIVE TRANSESOPHAGEAL ECHOCARDIOGRAM (N/A) CV- BPs trending up,HR stable. Will increase beta blocker.  May need to increase Lotensin if BPs remain elevated. Vol overload- diurese.  Expected postop blood loss anemia- stable.  Thrombocytopenia- plts trending up. Hypokalemia- K+ low, pt has been refusing meds due to nausea.  Encouraged her to take the meds today.  GI- nausea, possibly due to constipation.  She is only taking Ultram for  pain. Will try LOC again today.   INR with big jump today (was 1.75 yesterday).  Will hold Coumadin tonight so EPWs can be removed.  LOS: 4 days    COLLINS,GINA H 10/26/2012  Nausea today I have seen and examined Claudia Howell and agree with the above assessment  and plan.  Delight Ovens MD Beeper [phone] Office [phone] 10/26/2012 11:20 AM

## 2012-10-26 NOTE — Progress Notes (Signed)
10/26/2012 1700 Pt. Ambulated 150 ft with RW, RN and on RA. Pt. Tolerated well. O2 saturations checked during ambulation and were greater than 90% on RA. Pt. Tolerated walk well. Encouraged one more walk this evening.  Arth Nicastro, Blanchard Kelch

## 2012-10-26 NOTE — Progress Notes (Signed)
Pt ambulated 150 ft in hall with rolling walker on 2 L oxygen. Pt tolerated well.

## 2012-10-26 NOTE — Progress Notes (Signed)
10/26/2012 6:53 PM Nursing note Pt. Received coumadin educational booklet as well as viewed video #109. Questions and concerns addressed. Lonia Roane, Blanchard Kelch

## 2012-10-27 ENCOUNTER — Encounter (HOSPITAL_COMMUNITY): Payer: Self-pay | Admitting: Thoracic Surgery (Cardiothoracic Vascular Surgery)

## 2012-10-27 LAB — CBC
HCT: 27.6 % — ABNORMAL LOW (ref 36.0–46.0)
Hemoglobin: 9.5 g/dL — ABNORMAL LOW (ref 12.0–15.0)
MCH: 29.3 pg (ref 26.0–34.0)
MCHC: 34.4 g/dL (ref 30.0–36.0)
MCV: 85.2 fL (ref 78.0–100.0)
Platelets: 159 10*3/uL (ref 150–400)
RBC: 3.24 MIL/uL — ABNORMAL LOW (ref 3.87–5.11)
RDW: 14.6 % (ref 11.5–15.5)
WBC: 10.3 10*3/uL (ref 4.0–10.5)

## 2012-10-27 LAB — BASIC METABOLIC PANEL
BUN: 16 mg/dL (ref 6–23)
CO2: 32 mEq/L (ref 19–32)
Calcium: 8.4 mg/dL (ref 8.4–10.5)
Chloride: 95 mEq/L — ABNORMAL LOW (ref 96–112)
Creatinine, Ser: 0.58 mg/dL (ref 0.50–1.10)
GFR calc Af Amer: >90 mL/min (ref >90)
GFR calc non Af Amer: >90 mL/min (ref >90)
Glucose, Bld: 114 mg/dL — ABNORMAL HIGH (ref 70–99)
Potassium: 3.5 mEq/L (ref 3.5–5.1)
Sodium: 134 mEq/L — ABNORMAL LOW (ref 135–145)

## 2012-10-27 LAB — PROTIME-INR
INR: 2.21 — ABNORMAL HIGH (ref 0.00–1.49)
Prothrombin Time: 23.6 seconds — ABNORMAL HIGH (ref 11.6–15.2)

## 2012-10-27 MED ORDER — POTASSIUM CHLORIDE CRYS ER 20 MEQ PO TBCR
20.0000 meq | EXTENDED_RELEASE_TABLET | Freq: Two times a day (BID) | ORAL | Status: DC
  Administered 2012-10-27 – 2012-10-29 (×4): 20 meq via ORAL
  Filled 2012-10-27 (×5): qty 1

## 2012-10-27 MED ORDER — WARFARIN SODIUM 2.5 MG PO TABS
2.5000 mg | ORAL_TABLET | Freq: Every day | ORAL | Status: DC
  Administered 2012-10-27 – 2012-10-28 (×2): 2.5 mg via ORAL
  Filled 2012-10-27 (×3): qty 1

## 2012-10-27 MED ORDER — POTASSIUM CHLORIDE CRYS ER 20 MEQ PO TBCR
40.0000 meq | EXTENDED_RELEASE_TABLET | Freq: Once | ORAL | Status: AC
  Administered 2012-10-27: 40 meq via ORAL
  Filled 2012-10-27: qty 2

## 2012-10-27 MED ORDER — BENAZEPRIL HCL 20 MG PO TABS
20.0000 mg | ORAL_TABLET | Freq: Every day | ORAL | Status: DC
  Administered 2012-10-27: 20 mg via ORAL
  Filled 2012-10-27 (×2): qty 1

## 2012-10-27 NOTE — Discharge Summary (Signed)
Physician Discharge Summary  Patient ID: Claudia Howell MRN: [id number] DOB/AGE: August 21, 1940 72 y.o.  Admit date: 10/22/2012 Discharge date: 10/27/2012  Admission Diagnoses:  Patient Active Problem List   Diagnosis Date Noted  . Hx of class IV angina pectoris 10/16/2012    Class: Acute  . Abnormal nuclear stress test 10/16/2012    Class: Acute  . Paroxysmal atrial fibrillation 10/16/2012    Class: Chronic  . Pure hypercholesterolemia 10/06/2012  . Essential hypertension, benign 10/06/2012  . Chest pain 10/06/2012   Discharge Diagnoses:   Patient Active Problem List   Diagnosis Date Noted  . S/P CABG x 3 10/22/2012  . S/P Maze operation for atrial fibrillation 10/22/2012  . Hx of class IV angina pectoris 10/16/2012    Class: Acute  . Abnormal nuclear stress test 10/16/2012    Class: Acute  . Paroxysmal atrial fibrillation 10/16/2012    Class: Chronic  . Pure hypercholesterolemia 10/06/2012  . Essential hypertension, benign 10/06/2012  . Chest pain 10/06/2012   Discharged Condition: good  History of Present Illness:   Ms. Claudia Howell is a 72 yo white female with known history of CAD.  The patient states that approximately 11 months ago she developed exertional intermittent episodes of chest discomfort with radiation down her arms.  These symptoms have progressed over the past few months lasting 2-3 hours on 3 separate occasions.  After presenting for evaluation by Dr. Katrinka Howell it was felt she would benefit from cardiac catheterization.  This was performed and showed a preserved EF and severe 3 vessel CAD.  During the procedure the patient developed Paroxysmal Atrial Fibrillation which terminated spontaneously.  Cardiothoracic surgery was consulted and the patient was evaluated by Dr. Cornelius Howell on 10/17/2012 at which time it was felt the patient would benefit from Coronary bypass and MAZE procedure.  The risks and benefits of the procedure were explained to the patient and she was agreeable to  proceed.  She remained stable in the hospital and was felt safe for discharge prior to surgery which was scheduled for 10/22/2012.  Hospital Course:   Ms. Claudia Howell presented to [facility] on 10/22/2012.  She was taken to the operating room and underwent CABG x 3 utilizing LIMA to LAD, SVG to Distal RCA, and SVG to OM.  She also underwent Complete MAZE procedure with clipping of Left Atrial Appendage.  She also underwent Endoscopic Saphenous vein harvest from the right thigh.  She tolerated the procedure well and was taken to the SICU in stable condition.  The patient developed hypovolemia the evening of surgery due to bleeding from right groin cannulation site.  She was transfused packed cells with improvement of her pressure.  The patient was extubated very early the morning after surgery.  During her stay in the ICU the patient required additional transfusion for blood loss anemia.  She was restarted on Amiodarone for Atrial Fibrillation prophylaxis.  She was also started on Coumadin.  Her chest tubes and arterial lines were removed without difficulty.  Once patient was medically stable she was transferred to the step down unit in stable condition.  The patient has continued to progress.  She is maintaining NSR and her pacing wires were removed without difficulty.  She remains on Coumadin and her INR is therapeutic.  However her Amiodarone has been discontinued due to persistent nausea.  The patient is ambulating without difficulty.  She is tolerating a regular diet.  Her CXR is stable with some bibasilar atelectasis and pleural effusions.  She is  medically stable at this time and we will discharge to SNF today 10/29/2012  Significant Diagnostic Studies:   HEMODYNAMICS: Aortic pressure was 112/65 mmHg; LV pressure was 113/3 mmHg; LVEDP 6 mm mercury. There was no gradient between the left ventricle and aorta.  ANGIOGRAPHIC DATA: The left main coronary artery is widely patent. Moderate calcification is  noted..  The left anterior descending artery is diffusely diseased starting just after the first diagonal proximally and extending into the mid vessel. There is focal 85-90% stenosis just beyond the second diagonal within this diffusely diseased region. The entire segment is moderately to severely calcified..  The left circumflex artery is 90% mid stenosis with segmental extension into a region of 50% narrowing. Moderate to severe calcification is noted.  The right coronary artery is highly diseased in the proximal, mid, and distal vessel. There is 50% proximal narrowing after the conus branch the mid vessel contains 90% stenosis. The mid vessel beyond the 90% stenosis is diffusely diseased with up to 80% narrowing. The distal RCA before 3 left ventricular branches, 2 of which are small contains 99% stenosis with TIMI grade 2 flow. I suspect the distal right coronary is the culprit for the patient's rest pain.  LEFT VENTRICULOGRAM: Left ventricular angiogram was done in the 30 RAO projection and revealed normal systolic function with mild mid inferior wall hypokinesis. The estimated ejection fraction of 55%.   Treatments: surgery:   Coronary Artery Bypass Grafting x 3  Left Internal Mammary Artery to Distal Left Anterior Descending Coronary Artery  Saphenous Vein Graft to Distal RIght Coronary Artery  Saphenous Vein Graft to Obtuse Marginal Branch of Left Circumflex Coronary Artery  Endoscopic Vein Harvest from Right Thigh  Maze Procedure  Left atrial lesion set using bipolar radiofrequency and cryothermy ablation  Clipping of LA appendage  Discharge Medications:     Medication List    STOP taking these medications       amiodarone 200 MG tablet  Commonly known as:  PACERONE     amLODipine 5 MG tablet  Commonly known as:  NORVASC     hydrochlorothiazide 25 MG tablet  Commonly known as:  HYDRODIURIL     nitroGLYCERIN 0.2 mg/hr  Commonly known as:  NITRODUR - Dosed in mg/24 hr      nitroGLYCERIN 0.4 MG SL tablet  Commonly known as:  NITROSTAT      TAKE these medications       Acetyl L-Carnitine 250 MG Caps  Take 1 capsule by mouth daily.     aspirin 81 MG EC tablet  Take 1 tablet (81 mg total) by mouth daily.     atenolol 100 MG tablet  Commonly known as:  TENORMIN  Take 100 mg by mouth daily.     atorvastatin 10 MG tablet  Commonly known as:  LIPITOR  TAKE 1 TABLET (10 MG TOTAL) BY MOUTH DAILY.     benazepril 40 MG tablet  Commonly known as:  LOTENSIN  Take 40 mg by mouth daily.     cholecalciferol 1000 UNITS tablet  Commonly known as:  VITAMIN D  Take 1,000 Units by mouth daily.     Co Q 10 100 MG Caps  Take 1 capsule by mouth daily.     [employer] BETA CAROTENE PO  Take 1 tablet by mouth daily.     Estroven Maximum Strength Tabs  Take 1 tablet by mouth daily.     furosemide 40 MG tablet  Commonly known as:  LASIX  Take 1 tablet (40 mg total) by mouth 2 (two) times daily. For 5 Days     Glucosamine-Chondroitin-MSM 500-400-300 MG Tabs  Take 2 tablets by mouth daily.     multivitamin with minerals tablet  Take 1 tablet by mouth daily.     multivitamin-lutein Caps  Take 1 capsule by mouth daily.     mupirocin ointment 2 %  Commonly known as:  BACTROBAN  Apply 1 application topically 2 (two) times daily.     potassium chloride SA 20 MEQ tablet  Commonly known as:  K-DUR,KLOR-CON  Take 1 tablet (20 mEq total) by mouth 2 (two) times daily with a meal. For 5 Days     traMADol 50 MG tablet  Commonly known as:  ULTRAM  Take 1-2 tablets (50-100 mg total) by mouth every 4 (four) hours as needed.     warfarin 2.5 MG tablet  Commonly known as:  COUMADIN  Take 1 tablet (2.5 mg total) by mouth daily.     Zinc 25 MG Tabs  Take 1 tablet by mouth daily.        The patient has been discharged on:   1.Beta Blocker:  Yes [  x ]                              No   [   ]                              If No, reason:  2.Ace Inhibitor/ARB: Yes [  x  ]                                     No  [    ]                                     If No, reason:  3.Statin:   Yes [  x ]                  No  [   ]                  If No, reason:  4.Ecasa:  Yes  [ x  ]                  No   [   ]                  If No, reason:     Disposition: 01-Home or Self Care   Future Appointments Provider Department Dept Phone   11/24/2012 10:30 AM Purcell Nails, MD [facility] [phone]         Follow-up Information   Follow up with Purcell Nails, MD On 11/24/2012. (Appointment is at 10:30)    Contact information:   [address] [employer] [address] [place] [address] [phone]       Follow up with [facility] On 11/24/2012. (Please get CXR at 8 AM the morning of your appointment with Dr. Cornelius Howell)    Contact information:   [employer]       Schedule an appointment as soon as possible for a visit with [name],  MD. (Please contact office to set up a 2-4 week appointment)    Contact information:   [address] [place] [address] [phone]       Signed: Lowella Dandy 10/27/2012, 9:15 AM

## 2012-10-27 NOTE — Progress Notes (Signed)
EPW removed per order and protocol, pt tolerated well, EPW ends intact. Instructed bedrest for one hour. Routine vital signs per protocol began. 133/69 , sinus rhythm 72. Will monitor closely Egbert Garibaldi A

## 2012-10-27 NOTE — Progress Notes (Signed)
CARDIAC REHAB PHASE I   PRE:  Rate/Rhythm: 76 SR    BP: sitting 108/53    SaO2: 89-90 RA  MODE:  Ambulation: 400 ft   POST:  Rate/Rhythm: 84     BP: sitting 110/60     SaO2: 86-94 during and after walk  Steady with RW, able to increase distance today. Feeling better. Some lightheadedness with borderline SaO2 on RA and low BP (had been 140/67 15 min prior to walk). Sts she will borrow a friends RW. Reapplied 2L O2 as SaO2 was still borderline. To recliner. Encouraged IS and more walking. [id number]   Claudia Howell [place] CES, ACSM 10/27/2012 10:15 AM

## 2012-10-27 NOTE — Progress Notes (Addendum)
                   [address]            [place] [address]          [phone]      5 Days Post-Op Procedure(s) (LRB): CORONARY ARTERY BYPASS GRAFTING (CABG) (N/A) MAZE (N/A) INTRAOPERATIVE TRANSESOPHAGEAL ECHOCARDIOGRAM (N/A)  Subjective: Patient not eating much-nausea. Mostly taking Glucerna.  Objective: Vital signs in last 24 hours: Temp:  [97.5 F (36.4 C)-98.7 F (37.1 C)] 98.7 F (37.1 C) (05/05 0555) Pulse Rate:  [67-79] 67 (05/05 0555) Cardiac Rhythm:  [-] Normal sinus rhythm (05/04 1940) Resp:  [12-18] 12 (05/05 0555) BP: (118-142)/(68-76) 142/74 mmHg (05/05 0555) SpO2:  [88 %-99 %] 99 % (05/05 0555) Weight:  [67.495 kg (148 lb 12.8 oz)-67.722 kg (149 lb 4.8 oz)] 67.495 kg (148 lb 12.8 oz) (05/05 0555)  Pre op weight  65 kg Current Weight  10/27/12 67.495 kg (148 lb 12.8 oz)      Intake/Output from previous day: 05/04 0701 - 05/05 0700 In: 240 [P.O.:240] Out: -    Physical Exam:  Cardiovascular: RRR Pulmonary: Clear to auscultation bilaterally; no rales, wheezes, or rhonchi. Abdomen: Soft, non tender, bowel sounds present. Extremities: Trace bilateral lower extremity edema. Wounds: Clean and dry.  No erythema or signs of infection.  Lab Results: CBC: Recent Labs  10/26/12 0545 10/27/12 0450  WBC 11.0* 10.3  HGB 9.0* 9.5*  HCT 26.0* 27.6*  PLT 120* 159   BMET:  Recent Labs  10/26/12 0545 10/27/12 0450  NA 134* 134*  K 2.9* 3.5  CL 95* 95*  CO2 30 32  GLUCOSE 112* 114*  BUN 19 16  CREATININE 0.56 0.58  CALCIUM 8.1* 8.4    PT/INR:  Lab Results  Component Value Date   INR 2.21* 10/27/2012   INR 2.64* 10/26/2012   INR 1.75* 10/25/2012   ABG:  INR: Will add last result for INR, ABG once components are confirmed Will add last 4 CBG results once components are confirmed  Assessment/Plan:  1. CV - SR. On Amiodarone 200 bid, Atenolol 25 bid, Lotensin 10 daily, and Coumadin. Will increase Lotensin to 20 daily for  better bp control. INR down to 2.21 so will remove EPW 2.  Pulmonary - Encourage incentive spirometer 3. Volume Overload - On  Lasix 40 bid. May be able to decrease to 40 daily. 4.  Acute blood loss anemia - H and H stable at 9.5 and 27.6 5. Thrombocytopenia resolved-platelets up to 159,000. 6.Supplement potassium 7.GI-still with occasional nausea which is worse at night. Will discuss decreasing Amiodarone. Had a small bowel movement   ZIMMERMAN,DONIELLE MPA-C 10/27/2012,7:44 AM  I have seen and examined the patient and agree with the assessment and plan as outlined.  Will d/c amiodarone due to persistent nausea.  Kamel Haven H 10/27/2012 8:57 AM

## 2012-10-28 LAB — PROTIME-INR
INR: 2.51 — ABNORMAL HIGH (ref 0.00–1.49)
Prothrombin Time: 25.9 seconds — ABNORMAL HIGH (ref 11.6–15.2)

## 2012-10-28 MED ORDER — BENAZEPRIL HCL 40 MG PO TABS
40.0000 mg | ORAL_TABLET | Freq: Every day | ORAL | Status: DC
  Administered 2012-10-28 – 2012-10-29 (×2): 40 mg via ORAL
  Filled 2012-10-28 (×2): qty 1

## 2012-10-28 NOTE — Evaluation (Signed)
Physical Therapy Evaluation Patient Details Name: Claudia Howell MRN: [id number] DOB: 03-08-1941 Today's Date: 10/28/2012 Time: 1191-4782 PT Time Calculation (min): 23 min  PT Assessment / Plan / Recommendation Clinical Impression  Patient is a 72 y/o female 6 days s/p CABG x 3 with MAZE procedure.  She presents with decreased activity tolerance, decreased balance, decreased safety with sternal precautions all limiting independence and safety with mobility and will benefit from skilled PT in the acute setting to maximize independence and allow return home alone following STSNF stay.    PT Assessment  Patient needs continued PT services    Follow Up Recommendations  SNF       Barriers to Discharge Decreased caregiver support      Equipment Recommendations  None recommended by PT       Frequency Min 3X/week    Precautions / Restrictions Precautions Precautions: Fall;Sternal   Pertinent Vitals/Pain Denies pain currently      Mobility  Bed Mobility Bed Mobility: Supine to Sit;Sit to Supine Supine to Sit: 5: Supervision;With rails Sit to Supine: 5: Supervision Details for Bed Mobility Assistance: cues for sternal precautions; second time hugged heart pillow Transfers Transfers: Sit to Stand;Stand to Sit Sit to Stand: From chair/3-in-1;With upper extremity assist;6: Modified independent (Device/Increase time) Stand to Sit: 6: Modified independent (Device/Increase time);With upper extremity assist;To chair/3-in-1 Details for Transfer Assistance: Reports going to bathroom on her own in room Ambulation/Gait Ambulation/Gait Assistance: 5: Supervision;4: Min guard Ambulation Distance (Feet): 120 Feet Assistive device: Rolling walker Ambulation/Gait Assistance Details: limited distance due to c/o right LE feeling really heavy Gait Pattern: Decreased hip/knee flexion - right;Decreased stride length        PT Diagnosis: Abnormality of gait;Generalized weakness  PT Problem List:  Decreased strength;Decreased activity tolerance;Decreased balance;Decreased mobility;Decreased safety awareness;Decreased knowledge of precautions PT Treatment Interventions: DME instruction;Balance training;Gait training;Patient/family education;Functional mobility training;Therapeutic activities;Therapeutic exercise   PT Goals Acute Rehab PT Goals PT Goal Formulation: With patient Time For Goal Achievement: 11/04/12 Potential to Achieve Goals: Good Pt will go Supine/Side to Sit: with modified independence;with HOB 0 degrees PT Goal: Supine/Side to Sit - Progress: Goal set today Pt will go Sit to Supine/Side: with modified independence;with HOB 0 degrees PT Goal: Sit to Supine/Side - Progress: Goal set today Pt will Ambulate: >150 feet;with modified independence;with least restrictive assistive device PT Goal: Ambulate - Progress: Goal set today Additional Goals Additional Goal #1: Patient will demonstrate decreased fall risk with Berg balance score at least 48/56. PT Goal: Additional Goal #1 - Progress: Goal set today  Visit Information  Last PT Received On: 10/28/12 Assistance Needed: +1    Subjective Data  Subjective: My leg feels really heavy. Patient Stated Goal: To go to rehab prior to home alone   Prior Functioning  Home Living Lives With: Alone Available Help at Discharge: Skilled Nursing Facility Type of Home: House (townhome) Home Access: Level entry Home Layout: One level Bathroom Shower/Tub: Health visitor: Handicapped height Bathroom Accessibility: Yes Home Adaptive Equipment: Grab bars in shower;Grab bars around toilet;Built-in shower seat;Hand-held shower hose Additional Comments: plans to borrow rolling walker from girlfriend Prior Function Level of Independence: Independent Driving: Yes Vocation: Retired Musician: No difficulties Dominant Hand: Right    Cognition  Cognition Arousal/Alertness: Awake/alert Behavior  During Therapy: WFL for tasks assessed/performed Overall Cognitive Status: Within Functional Limits for tasks assessed    Extremity/Trunk Assessment Right Upper Extremity Assessment RUE ROM/Strength/Tone: Due to precautions;Deficits RUE ROM/Strength/Tone Deficits: lifts to 90 degrees shoulder flexion;  strength grossly 4/5 RUE Sensation: WFL - Light Touch Left Upper Extremity Assessment LUE ROM/Strength/Tone: Due to precautions;Deficits LUE ROM/Strength/Tone Deficits: lifts to 90 degrees shoulder flexion; strength grossly 4/5 LUE Sensation: WFL - Light Touch Right Lower Extremity Assessment RLE ROM/Strength/Tone: Deficits RLE ROM/Strength/Tone Deficits: AROM WFL, strength hip flexion 3+/5, knee extension 4/5, flexion 4/5, ankle dorsiflexion 4+/5 RLE Sensation: WFL - Light Touch Left Lower Extremity Assessment LLE ROM/Strength/Tone: Within functional levels LLE Sensation: WFL - Light Touch   Balance Standardized Balance Assessment Standardized Balance Assessment: Berg Balance Test Berg Balance Test Sit to Stand: Able to stand  independently using hands Standing Unsupported: Able to stand safely 2 minutes Sitting with Back Unsupported but Feet Supported on Floor or Stool: Able to sit safely and securely 2 minutes Stand to Sit: Sits safely with minimal use of hands Transfers: Able to transfer safely, minor use of hands Standing Unsupported with Eyes Closed: Able to stand 10 seconds safely Standing Ubsupported with Feet Together: Able to place feet together independently and stand 1 minute safely From Standing, Reach Forward with Outstretched Arm: Can reach forward >12 cm safely (5") From Standing Position, Pick up Object from Floor: Able to pick up shoe safely and easily From Standing Position, Turn to Look Behind Over each Shoulder: Looks behind from both sides and weight shifts well Turn 360 Degrees: Able to turn 360 degrees safely but slowly Standing Unsupported, Alternately Place Feet  on Step/Stool: Able to complete >2 steps/needs minimal assist Standing Unsupported, One Foot in Front: Able to plae foot ahead of the other independently and hold 30 seconds Standing on One Leg: Tries to lift leg/unable to hold 3 seconds but remains standing independently Total Score: 45/56  End of Session PT - End of Session Equipment Utilized During Treatment: Gait belt Activity Tolerance: Patient limited by fatigue Patient left: in chair;with call bell/phone within reach  GP     [facility] 10/28/2012, 4:40 PM  [place], [place] [phone] 10/28/2012

## 2012-10-28 NOTE — Progress Notes (Addendum)
                    [address]            [place] [address]          [phone]     6 Days Post-Op Procedure(s) (LRB): CORONARY ARTERY BYPASS GRAFTING (CABG) (N/A) MAZE (N/A) INTRAOPERATIVE TRANSESOPHAGEAL ECHOCARDIOGRAM (N/A)  Subjective: Feels much better this am.  Ate a good breakfast, no nausea since Amio d/c'ed.  +BM.   Objective: Vital signs in last 24 hours: Patient Vitals for the past 24 hrs:  BP Temp Temp src Pulse Resp SpO2 Weight  10/28/12 0453 142/85 mmHg 98.3 F (36.8 C) Oral 74 18 98 % 149 lb 7.6 oz (67.8 kg)  10/27/12 2011 145/74 mmHg 99.5 F (37.5 C) Oral 83 18 93 % -  10/27/12 0931 140/67 mmHg - - 72 - - -  10/27/12 0900 122/69 mmHg - - 70 - - -  10/27/12 0845 133/69 mmHg - - 72 - - -  10/27/12 0838 122/73 mmHg - - 68 - - -   Current Weight  10/28/12 149 lb 7.6 oz (67.8 kg)     Intake/Output from previous day: 05/05 0701 - 05/06 0700 In: 240 [P.O.:240] Out: -     PHYSICAL EXAM:  Heart: RRR Lungs: Clear Wound: Clean and dry Extremities: Mild LE edema, R>L    Lab Results: CBC: Recent Labs  10/26/12 0545 10/27/12 0450  WBC 11.0* 10.3  HGB 9.0* 9.5*  HCT 26.0* 27.6*  PLT 120* 159   BMET:  Recent Labs  10/26/12 0545 10/27/12 0450  NA 134* 134*  K 2.9* 3.5  CL 95* 95*  CO2 30 32  GLUCOSE 112* 114*  BUN 19 16  CREATININE 0.56 0.58  CALCIUM 8.1* 8.4    PT/INR:  Recent Labs  10/28/12 0601  LABPROT 25.9*  INR 2.51*      Assessment/Plan: S/P Procedure(s) (LRB): CORONARY ARTERY BYPASS GRAFTING (CABG) (N/A) MAZE (N/A) INTRAOPERATIVE TRANSESOPHAGEAL ECHOCARDIOGRAM (N/A)  CV- BPs trending up,HR stable. Will increase Lotensin to home dose, may need to further increase beta blocker (was on Atenolol 100 mg at home).  Vol overload- diurese.   Expected postop blood loss anemia- stable.   Hypokalemia- supplement K+.  Disp- she now states her family will not be able to provide 24 hr care at home  post-d/c, so will need short term SNF.  Will consult CSW. Hopefully home soon.    LOS: 6 days    COLLINS,GINA H 10/28/2012  I have seen and examined the patient and agree with the assessment and plan as outlined.    Renji Berwick H 10/28/2012 8:36 AM

## 2012-10-28 NOTE — Addendum Note (Signed)
Addendum created 10/28/12 1219 by Adair Laundry, CRNA   Modules edited: Anesthesia Events

## 2012-10-28 NOTE — Progress Notes (Signed)
No acute cardiac issues. On Coumadin because of Maze procedure.Marland Kitchen

## 2012-10-28 NOTE — Clinical Social Work Placement (Addendum)
    Clinical Social Work Department CLINICAL SOCIAL WORK PLACEMENT NOTE 10/28/2012  Patient:  Claudia Howell, Claudia Howell  Account Number:  [id number] Admit date:  10/22/2012  Clinical Social Worker:  Hulan Fray  Date/time:  10/28/2012 03:03 PM  Clinical Social Work is seeking post-discharge placement for this patient at the following level of care:   SKILLED NURSING   (*CSW will update this form in Epic as items are completed)   10/28/2012  Patient/family provided with [facility] Department of Clinical Social Work's list of facilities offering this level of care within the geographic area requested by the patient (or if unable, by the patient's family).  10/28/2012  Patient/family informed of their freedom to choose among providers that offer the needed level of care, that participate in Medicare, Medicaid or managed care program needed by the patient, have an available bed and are willing to accept the patient.  10/28/2012  Patient/family informed of [facility]' ownership interest in [facility], as well as of the fact that they are under no obligation to receive care at this facility.  PASARR submitted to EDS on 10/28/2012 PASARR number received from EDS on 10/28/2012  FL2 transmitted to all facilities in geographic area requested by pt/family on  10/28/2012 FL2 transmitted to all facilities within larger geographic area on   Patient informed that his/her managed care company has contracts with or will negotiate with  certain facilities, including the following:     Patient/family informed of bed offers received:  10-29-12 Patient chooses bed at [facility] Physician recommends and patient chooses bed at    Patient to be transferred to [facility] at [place] on 10-29-12   Patient to be transferred to facility by Private vehicle  The following physician request were entered in Epic:   Additional Comments:

## 2012-10-28 NOTE — Progress Notes (Signed)
CARDIAC REHAB PHASE I   PRE:  Rate/Rhythm: 84 SR  BP:  Supine:   Sitting: 124/60  Standing:    SaO2: 95 RA  MODE:  Ambulation: 900 ft   POST:  Rate/Rhythm: 94  BP:  Supine:   Sitting: 126/60  Standing:    SaO2: 93 RA 1420-1455  Assisted X 1 and used walker to ambulate. Gait steady with walker. VS stable. Pt back to side of bed after walk with call light in reach. Pt tolerated ambulation well without c/o.  Melina Copa RN 10/28/2012 2:54 PM

## 2012-10-28 NOTE — Clinical Social Work Psychosocial (Addendum)
    Clinical Social Work Department BRIEF PSYCHOSOCIAL ASSESSMENT 10/28/2012  Patient:  Claudia Howell, Claudia Howell     Account Number:  [id number]     Admit date:  10/22/2012  Clinical Social Worker:  Hulan Fray  Date/Time:  10/28/2012 01:14 PM  Referred by:  Physician  Date Referred:  10/28/2012 Referred for  SNF Placement   Other Referral:   Interview type:  Patient Other interview type:    PSYCHOSOCIAL DATA Living Status:  ALONE Admitted from facility:   Level of care:   Primary support name:  Otila Back Primary support relationship to patient:  CHILD, ADULT Degree of support available:   supportive    CURRENT CONCERNS Current Concerns  Post-Acute Placement   Other Concerns:    SOCIAL WORK ASSESSMENT / PLAN Clinical Social Worker received referral for patient needing short term SNF placement. CSW introduced self and explained reason for visit. CSW provided SNF packet to patient. CSW explained SNF process. Patient was agreeable for CSW to initiate SNF process in [facility] and expressed interest in [place], [place] at [place] and [facility], if they have availability.    CSW will complete FL2 for MD's signature and update patient when bed offers are made.   Assessment/plan status:  Psychosocial Support/Ongoing Assessment of Needs Other assessment/ plan:   Information/referral to community resources:   SNF placement list    PATIENT'S/FAMILY'S RESPONSE TO PLAN OF CARE: Patient appeared agreeable for CSW to initiate SNF process in [facility]. Patient appreciated CSW's visit and assistance with SNF process.

## 2012-10-29 ENCOUNTER — Inpatient Hospital Stay (HOSPITAL_COMMUNITY): Payer: Medicare Other

## 2012-10-29 DIAGNOSIS — I479 Paroxysmal tachycardia, unspecified: Secondary | ICD-10-CM | POA: Diagnosis not present

## 2012-10-29 DIAGNOSIS — I2510 Atherosclerotic heart disease of native coronary artery without angina pectoris: Secondary | ICD-10-CM | POA: Diagnosis not present

## 2012-10-29 DIAGNOSIS — J90 Pleural effusion, not elsewhere classified: Secondary | ICD-10-CM | POA: Diagnosis not present

## 2012-10-29 DIAGNOSIS — Z951 Presence of aortocoronary bypass graft: Secondary | ICD-10-CM | POA: Diagnosis not present

## 2012-10-29 DIAGNOSIS — I209 Angina pectoris, unspecified: Secondary | ICD-10-CM | POA: Diagnosis not present

## 2012-10-29 DIAGNOSIS — Z5189 Encounter for other specified aftercare: Secondary | ICD-10-CM | POA: Diagnosis not present

## 2012-10-29 DIAGNOSIS — E785 Hyperlipidemia, unspecified: Secondary | ICD-10-CM | POA: Diagnosis not present

## 2012-10-29 DIAGNOSIS — M6250 Muscle wasting and atrophy, not elsewhere classified, unspecified site: Secondary | ICD-10-CM | POA: Diagnosis not present

## 2012-10-29 DIAGNOSIS — M949 Disorder of cartilage, unspecified: Secondary | ICD-10-CM | POA: Diagnosis not present

## 2012-10-29 DIAGNOSIS — E7800 Pure hypercholesterolemia, unspecified: Secondary | ICD-10-CM | POA: Diagnosis not present

## 2012-10-29 DIAGNOSIS — Z48812 Encounter for surgical aftercare following surgery on the circulatory system: Secondary | ICD-10-CM | POA: Diagnosis not present

## 2012-10-29 DIAGNOSIS — J9819 Other pulmonary collapse: Secondary | ICD-10-CM | POA: Diagnosis not present

## 2012-10-29 DIAGNOSIS — D638 Anemia in other chronic diseases classified elsewhere: Secondary | ICD-10-CM | POA: Diagnosis not present

## 2012-10-29 DIAGNOSIS — I10 Essential (primary) hypertension: Secondary | ICD-10-CM | POA: Diagnosis not present

## 2012-10-29 DIAGNOSIS — M899 Disorder of bone, unspecified: Secondary | ICD-10-CM | POA: Diagnosis not present

## 2012-10-29 DIAGNOSIS — I4891 Unspecified atrial fibrillation: Secondary | ICD-10-CM | POA: Diagnosis not present

## 2012-10-29 LAB — CBC
HCT: 27.4 % — ABNORMAL LOW (ref 36.0–46.0)
Hemoglobin: 9.3 g/dL — ABNORMAL LOW (ref 12.0–15.0)
MCH: 29.2 pg (ref 26.0–34.0)
MCHC: 33.9 g/dL (ref 30.0–36.0)
MCV: 85.9 fL (ref 78.0–100.0)
Platelets: 220 10*3/uL (ref 150–400)
RBC: 3.19 MIL/uL — ABNORMAL LOW (ref 3.87–5.11)
RDW: 14.7 % (ref 11.5–15.5)
WBC: 10.4 10*3/uL (ref 4.0–10.5)

## 2012-10-29 LAB — BASIC METABOLIC PANEL
BUN: 18 mg/dL (ref 6–23)
CO2: 31 mEq/L (ref 19–32)
Calcium: 8.2 mg/dL — ABNORMAL LOW (ref 8.4–10.5)
Chloride: 94 mEq/L — ABNORMAL LOW (ref 96–112)
Creatinine, Ser: 0.69 mg/dL (ref 0.50–1.10)
GFR calc Af Amer: >90 mL/min (ref >90)
GFR calc non Af Amer: 87 mL/min — ABNORMAL LOW (ref >90)
Glucose, Bld: 106 mg/dL — ABNORMAL HIGH (ref 70–99)
Potassium: 3.8 mEq/L (ref 3.5–5.1)
Sodium: 132 mEq/L — ABNORMAL LOW (ref 135–145)

## 2012-10-29 LAB — PROTIME-INR
INR: 3.16 — ABNORMAL HIGH (ref 0.00–1.49)
Prothrombin Time: 30.7 seconds — ABNORMAL HIGH (ref 11.6–15.2)

## 2012-10-29 MED ORDER — TRAMADOL HCL 50 MG PO TABS: 50.0000 mg | ORAL_TABLET | ORAL | Status: DC | PRN

## 2012-10-29 MED ORDER — FUROSEMIDE 40 MG PO TABS: 40.0000 mg | ORAL_TABLET | Freq: Two times a day (BID) | ORAL | Status: DC

## 2012-10-29 MED ORDER — POTASSIUM CHLORIDE CRYS ER 20 MEQ PO TBCR: 20.0000 meq | EXTENDED_RELEASE_TABLET | Freq: Two times a day (BID) | ORAL | Status: DC

## 2012-10-29 MED ORDER — ATENOLOL 50 MG PO TABS
50.0000 mg | ORAL_TABLET | Freq: Two times a day (BID) | ORAL | Status: DC
  Administered 2012-10-29: 50 mg via ORAL
  Filled 2012-10-29 (×2): qty 1

## 2012-10-29 MED ORDER — WARFARIN SODIUM 2.5 MG PO TABS: 2.5000 mg | ORAL_TABLET | Freq: Every day | ORAL | Status: DC

## 2012-10-29 MED ORDER — ASPIRIN 81 MG PO TBEC: 81.0000 mg | DELAYED_RELEASE_TABLET | Freq: Every day | ORAL | Status: DC

## 2012-10-29 NOTE — Progress Notes (Signed)
Ed completed with pt. Voiced understanding, good comprehension. Requests her name be sent to [facility]. [id number] Ethelda Chick CES, ACSM 10:18 AM 10/29/2012

## 2012-10-29 NOTE — Progress Notes (Signed)
Physical Therapy Treatment Patient Details Name: Claudia Howell MRN: [id number] DOB: 1940-11-27 Today's Date: 10/29/2012 Time: 4098-1191 PT Time Calculation (min): 25 min  PT Assessment / Plan / Recommendation Comments on Treatment Session  Pt progressing very well with mobility. Increased gait distance today with no AD. Educated on ex's for LE strengthening and on ex's for gently cervical stretching.                       Follow Up Recommendations  SNF           Equipment Recommendations  None recommended by PT       Frequency Min 3X/week   Plan Discharge plan remains appropriate;Frequency remains appropriate    Precautions / Restrictions Precautions Precautions: Fall;Sternal Restrictions Weight Bearing Restrictions: No       Mobility  Bed Mobility Bed Mobility: Not assessed Transfers Sit to Stand: 6: Modified independent (Device/Increase time);With upper extremity assist;From chair/3-in-1 Stand to Sit: 6: Modified independent (Device/Increase time);With upper extremity assist;To chair/3-in-1 Ambulation/Gait Ambulation Distance (Feet): 200 Feet Assistive device: None Gait Pattern: Within Functional Limits Gait velocity: Slight decreased    Exercises General Exercises - Lower Extremity Long Arc Quad: AROM;Strengthening;Both;10 reps;Seated Hip Flexion/Marching: AROM;10 reps;Seated;Both;Strengthening Toe Raises: AROM;Both;Seated;10 reps Heel Raises: AROM;Both;Seated;10 reps Other Exercises Other Exercises: chin tucks: 5 sec hold x10 reps Other Exercises: shoulder shrugs/depressions with shoulders going in opposite directions x5 each way to each shoulder Other Exercises: Head (cervical rotation) to Rt/Lt x5 each direction    PT Goals Acute Rehab PT Goals Pt will Ambulate: >150 feet;with modified independence;with least restrictive assistive device PT Goal: Ambulate - Progress: Progressing toward goal Additional Goals Additional Goal #1: Patient will demonstrate  decreased fall risk with Berg balance score at least 48/56. PT Goal: Additional Goal #1 - Progress: Progressing toward goal  Visit Information  Last PT Received On: 10/29/12 Assistance Needed: +1    Subjective Data  Subjective: No new complaints, agreeable to therapy at this time.   Cognition  Cognition Arousal/Alertness: Awake/alert Behavior During Therapy: WFL for tasks assessed/performed Overall Cognitive Status: Within Functional Limits for tasks assessed       End of Session PT - End of Session Equipment Utilized During Treatment: Gait belt Activity Tolerance: Patient tolerated treatment well Patient left: in chair;with call bell/phone within reach Nurse Communication: Mobility status   GP     Sallyanne Kuster 10/29/2012, 2:12 PM  Sallyanne Kuster, PTA Office- [phone]

## 2012-10-29 NOTE — Clinical Social Work Note (Signed)
Clinical Child psychotherapist facilitated discharge by contacting patient and facility, [facility]. CSW will complete discharge packet and leave with patient. Per patient, she will be transported via private vehicle of family. CSW will sign off, as social work intervention is no longer needed.   Rozetta Nunnery MSW, [employer] [phone]

## 2012-10-29 NOTE — Progress Notes (Addendum)
                    [address]            [place] [address]          [phone]     7 Days Post-Op Procedure(s) (LRB): CORONARY ARTERY BYPASS GRAFTING (CABG) (N/A) MAZE (N/A) INTRAOPERATIVE TRANSESOPHAGEAL ECHOCARDIOGRAM (N/A)  Subjective: Doing well, no further nausea.    Objective: Vital signs in last 24 hours: Patient Vitals for the past 24 hrs:  BP Temp Temp src Pulse Resp SpO2 Weight  10/29/12 0450 137/67 mmHg 99.5 F (37.5 C) Oral 90 20 98 % 147 lb 4.8 oz (66.815 kg)  10/28/12 2142 119/58 mmHg 99 F (37.2 C) Oral 84 18 93 % -  10/28/12 1322 145/65 mmHg 99 F (37.2 C) Oral 80 19 98 % -  10/28/12 1039 153/78 mmHg - - 90 - - -   Current Weight  10/29/12 147 lb 4.8 oz (66.815 kg)  10/28/12          149 lb 7.6 oz (67.8 kg)    Intake/Output from previous day: 05/06 0701 - 05/07 0700 In: 840 [P.O.:840] Out: -     PHYSICAL EXAM:  Heart: RRR Lungs: Clear Wound: Clean and dry Extremities: Mild LE edema    Lab Results: CBC: Recent Labs  10/27/12 0450 10/29/12 0445  WBC 10.3 10.4  HGB 9.5* 9.3*  HCT 27.6* 27.4*  PLT 159 220   BMET:  Recent Labs  10/27/12 0450 10/29/12 0445  NA 134* 132*  K 3.5 3.8  CL 95* 94*  CO2 32 31  GLUCOSE 114* 106*  BUN 16 18  CREATININE 0.58 0.69  CALCIUM 8.4 8.2*    PT/INR:  Recent Labs  10/29/12 0445  LABPROT 30.7*  INR 3.16*    CXR: tiny L effusion   Assessment/Plan: S/P Procedure(s) (LRB): CORONARY ARTERY BYPASS GRAFTING (CABG) (N/A) MAZE (N/A) INTRAOPERATIVE TRANSESOPHAGEAL ECHOCARDIOGRAM (N/A)  CV- BPs remain elevated, HR trending up.  Will continue home Lotensin, increase beta blocker to home dose.  INR supratherapeutic.  Will hold Coumadin tonight, may need to start at lower dose tomorrow.  Vol overload- diurese.  Expected postop blood loss anemia- stable.  Hypokalemia- supplement K+. Disp- awaiting bed offers for SNF.  Medically stable to be discharged when bed  available.  LOS: 7 days    COLLINS,GINA H 10/29/2012  I have seen and examined the patient and agree with the assessment and plan as outlined.  Dietrick Barris H 10/29/2012 9:27 AM

## 2012-10-30 DIAGNOSIS — I10 Essential (primary) hypertension: Secondary | ICD-10-CM | POA: Diagnosis not present

## 2012-10-30 DIAGNOSIS — I479 Paroxysmal tachycardia, unspecified: Secondary | ICD-10-CM | POA: Diagnosis not present

## 2012-10-30 DIAGNOSIS — E785 Hyperlipidemia, unspecified: Secondary | ICD-10-CM | POA: Diagnosis not present

## 2012-10-30 DIAGNOSIS — M6250 Muscle wasting and atrophy, not elsewhere classified, unspecified site: Secondary | ICD-10-CM | POA: Diagnosis not present

## 2012-10-31 DIAGNOSIS — I4891 Unspecified atrial fibrillation: Secondary | ICD-10-CM | POA: Diagnosis not present

## 2012-10-31 DIAGNOSIS — I2510 Atherosclerotic heart disease of native coronary artery without angina pectoris: Secondary | ICD-10-CM | POA: Diagnosis not present

## 2012-10-31 DIAGNOSIS — D638 Anemia in other chronic diseases classified elsewhere: Secondary | ICD-10-CM | POA: Diagnosis not present

## 2012-10-31 DIAGNOSIS — M6250 Muscle wasting and atrophy, not elsewhere classified, unspecified site: Secondary | ICD-10-CM | POA: Diagnosis not present

## 2012-11-14 ENCOUNTER — Emergency Department (HOSPITAL_COMMUNITY)
Admission: EM | Admit: 2012-11-14 | Discharge: 2012-11-14 | Disposition: A | Discharge status: Home / Self Care | Payer: Medicare Other | Attending: Emergency Medicine | Admitting: Emergency Medicine

## 2012-11-14 ENCOUNTER — Encounter (HOSPITAL_COMMUNITY): Payer: Self-pay | Admitting: Emergency Medicine

## 2012-11-14 ENCOUNTER — Emergency Department (INDEPENDENT_AMBULATORY_CARE_PROVIDER_SITE_OTHER)
Admission: EM | Admit: 2012-11-14 | Discharge: 2012-11-14 | Disposition: A | Discharge status: Nursing Home (Includes ICF) | Payer: Medicare Other | Source: Home / Self Care | Attending: Emergency Medicine | Admitting: Emergency Medicine

## 2012-11-14 DIAGNOSIS — M899 Disorder of bone, unspecified: Secondary | ICD-10-CM | POA: Diagnosis not present

## 2012-11-14 DIAGNOSIS — Z79899 Other long term (current) drug therapy: Secondary | ICD-10-CM | POA: Diagnosis not present

## 2012-11-14 DIAGNOSIS — Z7982 Long term (current) use of aspirin: Secondary | ICD-10-CM | POA: Diagnosis not present

## 2012-11-14 DIAGNOSIS — I10 Essential (primary) hypertension: Secondary | ICD-10-CM | POA: Diagnosis not present

## 2012-11-14 DIAGNOSIS — Z7901 Long term (current) use of anticoagulants: Secondary | ICD-10-CM | POA: Insufficient documentation

## 2012-11-14 DIAGNOSIS — L89109 Pressure ulcer of unspecified part of back, unspecified stage: Secondary | ICD-10-CM | POA: Insufficient documentation

## 2012-11-14 DIAGNOSIS — L8990 Pressure ulcer of unspecified site, unspecified stage: Secondary | ICD-10-CM | POA: Insufficient documentation

## 2012-11-14 DIAGNOSIS — R112 Nausea with vomiting, unspecified: Secondary | ICD-10-CM | POA: Diagnosis not present

## 2012-11-14 DIAGNOSIS — E785 Hyperlipidemia, unspecified: Secondary | ICD-10-CM | POA: Insufficient documentation

## 2012-11-14 DIAGNOSIS — T50995A Adverse effect of other drugs, medicaments and biological substances, initial encounter: Secondary | ICD-10-CM | POA: Insufficient documentation

## 2012-11-14 DIAGNOSIS — Z951 Presence of aortocoronary bypass graft: Secondary | ICD-10-CM | POA: Diagnosis not present

## 2012-11-14 DIAGNOSIS — I2510 Atherosclerotic heart disease of native coronary artery without angina pectoris: Secondary | ICD-10-CM | POA: Insufficient documentation

## 2012-11-14 DIAGNOSIS — T40605A Adverse effect of unspecified narcotics, initial encounter: Secondary | ICD-10-CM | POA: Insufficient documentation

## 2012-11-14 DIAGNOSIS — Z9889 Other specified postprocedural states: Secondary | ICD-10-CM | POA: Insufficient documentation

## 2012-11-14 DIAGNOSIS — Z8719 Personal history of other diseases of the digestive system: Secondary | ICD-10-CM | POA: Diagnosis not present

## 2012-11-14 DIAGNOSIS — T50905A Adverse effect of unspecified drugs, medicaments and biological substances, initial encounter: Secondary | ICD-10-CM

## 2012-11-14 DIAGNOSIS — R1110 Vomiting, unspecified: Secondary | ICD-10-CM

## 2012-11-14 LAB — CBC WITH DIFFERENTIAL/PLATELET
Basophils Absolute: 0 10*3/uL (ref 0.0–0.1)
Basophils Relative: 1 % (ref 0–1)
Eosinophils Absolute: 0 10*3/uL (ref 0.0–0.7)
Eosinophils Relative: 0 % (ref 0–5)
HCT: 34.2 % — ABNORMAL LOW (ref 36.0–46.0)
Hemoglobin: 11.3 g/dL — ABNORMAL LOW (ref 12.0–15.0)
Lymphocytes Relative: 8 % — ABNORMAL LOW (ref 12–46)
Lymphs Abs: 0.7 10*3/uL (ref 0.7–4.0)
MCH: 29.8 pg (ref 26.0–34.0)
MCHC: 33 g/dL (ref 30.0–36.0)
MCV: 90.2 fL (ref 78.0–100.0)
Monocytes Absolute: 0.4 10*3/uL (ref 0.1–1.0)
Monocytes Relative: 5 % (ref 3–12)
Neutro Abs: 7.4 10*3/uL (ref 1.7–7.7)
Neutrophils Relative %: 86 % — ABNORMAL HIGH (ref 43–77)
Platelets: 341 10*3/uL (ref 150–400)
RBC: 3.79 MIL/uL — ABNORMAL LOW (ref 3.87–5.11)
RDW: 15 % (ref 11.5–15.5)
WBC: 8.6 10*3/uL (ref 4.0–10.5)

## 2012-11-14 LAB — COMPREHENSIVE METABOLIC PANEL
ALT: 21 U/L (ref 0–35)
AST: 21 U/L (ref 0–37)
Albumin: 3.5 g/dL (ref 3.5–5.2)
Alkaline Phosphatase: 90 U/L (ref 39–117)
BUN: 9 mg/dL (ref 6–23)
CO2: 23 mEq/L (ref 19–32)
Calcium: 9.4 mg/dL (ref 8.4–10.5)
Chloride: 98 mEq/L (ref 96–112)
Creatinine, Ser: 0.54 mg/dL (ref 0.50–1.10)
GFR calc Af Amer: >90 mL/min (ref >90)
GFR calc non Af Amer: >90 mL/min (ref >90)
Glucose, Bld: 113 mg/dL — ABNORMAL HIGH (ref 70–99)
Potassium: 3.3 mEq/L — ABNORMAL LOW (ref 3.5–5.1)
Sodium: 137 mEq/L (ref 135–145)
Total Bilirubin: 0.5 mg/dL (ref 0.3–1.2)
Total Protein: 8 g/dL (ref 6.0–8.3)

## 2012-11-14 LAB — PROTIME-INR
INR: 1.92 — ABNORMAL HIGH (ref 0.00–1.49)
Prothrombin Time: 21.2 seconds — ABNORMAL HIGH (ref 11.6–15.2)

## 2012-11-14 MED ORDER — ONDANSETRON HCL 4 MG/2ML IJ SOLN
4.0000 mg | Freq: Once | INTRAMUSCULAR | Status: AC
  Administered 2012-11-14: 4 mg via INTRAVENOUS
  Filled 2012-11-14: qty 2

## 2012-11-14 MED ORDER — POTASSIUM CHLORIDE CRYS ER 20 MEQ PO TBCR
20.0000 meq | EXTENDED_RELEASE_TABLET | Freq: Once | ORAL | Status: AC
  Administered 2012-11-14: 20 meq via ORAL
  Filled 2012-11-14: qty 1

## 2012-11-14 MED ORDER — WARFARIN - PHYSICIAN DOSING INPATIENT: Freq: Every day | Status: DC

## 2012-11-14 MED ORDER — ONDANSETRON 4 MG PO TBDP: 4.0000 mg | ORAL_TABLET | Freq: Three times a day (TID) | ORAL | Status: DC | PRN

## 2012-11-14 MED ORDER — WARFARIN SODIUM 2.5 MG PO TABS
2.5000 mg | ORAL_TABLET | Freq: Once | ORAL | Status: AC
  Administered 2012-11-14: 2.5 mg via ORAL
  Filled 2012-11-14: qty 1

## 2012-11-14 MED ORDER — SODIUM CHLORIDE 0.9 % IV BOLUS (SEPSIS)
500.0000 mL | Freq: Once | INTRAVENOUS | Status: AC
  Administered 2012-11-14: 500 mL via INTRAVENOUS

## 2012-11-14 NOTE — ED Notes (Signed)
Pt st's she had coronary bypass 3 weeks ago and was given Tramadol for pain.  St's she started vomiting last pm and had continued through today.  Pt was told by her MD to go to a Urgent Care ref. Vomiting because she is on Coumadin and can not keep it down.

## 2012-11-14 NOTE — ED Provider Notes (Signed)
History     CSN: [id number]  Arrival date & time 11/14/12  1718   None     No chief complaint on file.   (Consider location/radiation/quality/duration/timing/severity/associated sxs/prior treatment) Patient is a 72 y.o. female presenting with vomiting. The history is provided by the patient. No language interpreter was used.  Emesis Severity:  Moderate Timing:  Constant Number of daily episodes:  Multiple Progression:  Worsening Chronicity:  New Relieved by:  Nothing Worsened by:  Nothing tried Ineffective treatments:  None tried Associated symptoms: no abdominal pain   Risk factors: no diabetes     Past Medical History  Diagnosis Date  . Hypertension     Dr.Henry Katrinka Blazing  . Hyperlipidemia   . Osteopenia 9/09  . Adenomatous colon polyp 10/09  . Chest pain on exertion     "escalating over last few months" (10/16/2012)  . Coronary artery disease   . PAF (paroxysmal atrial fibrillation)     during cath/notes 10/16/2012  . Decubitus ulcer of coccyx 10/22/12    1/2 inch raw open area on coccyx  . S/P CABG x 3 10/22/2012    LIMA to LAD, SVG to distal RCA, SVG to OM, EVH from right thigh  . S/P Maze operation for atrial fibrillation 10/22/2012    Left side lesion set using bipolar radiofrequency and cryothermy ablation with clipping of LA appendage    Past Surgical History  Procedure Laterality Date  . Total abdominal hysterectomy w/ bilateral salpingoophorectomy  1990    benign growth  . Cardiac catheterization  10/16/2012  . Tonsillectomy  1949  . Abdominal hysterectomy    . Breast cyst excision Left 1980-1990's    x 3  . Coronary artery bypass graft  10/22/2012    Dr Cornelius Moras  . Coronary artery bypass graft N/A 10/22/2012    Procedure: CORONARY ARTERY BYPASS GRAFTING (CABG);  Surgeon: Purcell Nails, MD;  Location: [facility] OR;  Service: Open Heart Surgery;  Laterality: N/A;  . Maze N/A 10/22/2012    Procedure: MAZE;  Surgeon: Purcell Nails, MD;  Location: [facility] OR;  Service: Open  Heart Surgery;  Laterality: N/A;  . Intraoperative transesophageal echocardiogram N/A 10/22/2012    Procedure: INTRAOPERATIVE TRANSESOPHAGEAL ECHOCARDIOGRAM;  Surgeon: Purcell Nails, MD;  Location: [facility] OR;  Service: Open Heart Surgery;  Laterality: N/A;    Family History  Problem Relation Age of Onset  . Hypertension Mother   . Heart attack Mother   . Heart disease Mother   . Hypertension Brother   . Cancer Brother     prostate  . Diabetes Neg Hx   . Breast cancer Neg Hx   . Colon cancer Neg Hx     History  Substance Use Topics  . Smoking status: Never Smoker   . Smokeless tobacco: Never Used  . Alcohol Use: 0.6 oz/week    1 Glasses of wine per week     Comment: 1 glass of wine weekly (when playing cards)    OB History   Grav Para Term Preterm Abortions TAB SAB Ect Mult Living   3 2   1  1   2       Review of Systems  Gastrointestinal: Positive for vomiting. Negative for abdominal pain.  All other systems reviewed and are negative.    Allergies  Review of patient's allergies indicates no known allergies.  Home Medications   Current Outpatient Rx  Name  Route  Sig  Dispense  Refill  . Acetylcarnitine HCl (ACETYL L-CARNITINE)  250 MG CAPS   Oral   Take 1 capsule by mouth daily.         Marland Kitchen aspirin EC 81 MG EC tablet   Oral   Take 1 tablet (81 mg total) by mouth daily.         Marland Kitchen atenolol (TENORMIN) 100 MG tablet   Oral   Take 100 mg by mouth daily.         Marland Kitchen atorvastatin (LIPITOR) 10 MG tablet      TAKE 1 TABLET (10 MG TOTAL) BY MOUTH DAILY.   90 tablet   1   . benazepril (LOTENSIN) 40 MG tablet   Oral   Take 40 mg by mouth daily.         . cholecalciferol (VITAMIN D) 1000 UNITS tablet   Oral   Take 1,000 Units by mouth daily.         . Coenzyme Q10 (CO Q 10) 100 MG CAPS   Oral   Take 1 capsule by mouth daily.         . CVS BETA CAROTENE PO   Oral   Take 1 tablet by mouth daily.         . furosemide (LASIX) 40 MG tablet   Oral    Take 1 tablet (40 mg total) by mouth 2 (two) times daily. For 5 Days   30 tablet      . Glucosamine-Chondroitin-MSM 500-400-300 MG TABS   Oral   Take 2 tablets by mouth daily.         . Multiple Vitamins-Minerals (MULTIVITAMIN WITH MINERALS) tablet   Oral   Take 1 tablet by mouth daily.         . multivitamin-lutein (OCUVITE-LUTEIN) CAPS   Oral   Take 1 capsule by mouth daily.         . mupirocin ointment (BACTROBAN) 2 %   Topical   Apply 1 application topically 2 (two) times daily.   22 g   0   . Nutritional Supplements (ESTROVEN MAXIMUM STRENGTH) TABS   Oral   Take 1 tablet by mouth daily.         . potassium chloride SA (K-DUR,KLOR-CON) 20 MEQ tablet   Oral   Take 1 tablet (20 mEq total) by mouth 2 (two) times daily with a meal. For 5 Days         . traMADol (ULTRAM) 50 MG tablet   Oral   Take 1-2 tablets (50-100 mg total) by mouth every 4 (four) hours as needed.   30 tablet      . warfarin (COUMADIN) 2.5 MG tablet   Oral   Take 1 tablet (2.5 mg total) by mouth daily.         . Zinc 25 MG TABS   Oral   Take 1 tablet by mouth daily.           BP 173/64  Pulse 81  Temp(Src) 99.3 F (37.4 C) (Oral)  Resp 16  SpO2 99%  Physical Exam  Vitals reviewed. Constitutional: She appears well-developed and well-nourished.  HENT:  Head: Normocephalic and atraumatic.  Eyes: Pupils are equal, round, and reactive to light.  Cardiovascular: Normal rate.   Pulmonary/Chest: Effort normal.  Abdominal: Soft.  Musculoskeletal: She exhibits edema.  Neurological: She is alert.  Skin: Skin is warm.    ED Course  Procedures (including critical care time)  Labs Reviewed - No data to display No results found.   1. Vomiting  MDM   Date: 11/14/2012  Rate: 81  Rhythm: normal sinus rhythm  QRS Axis: normal  Intervals: normal  ST/T Wave abnormalities: normal  Conduction Disutrbances:none  Narrative Interpretation:   Old EKG Reviewed: none  available     Pt called Dr. Michaelle Copas office and was told to come to Urgent care.   Pt has been vomiting all day.   I think pt needs labs, shest xray, Iv fluids and possible admission  IV hydration.  I discussed pt with Dr. Lorenz Coaster who agreed.  Pt to Emergency department for hydraion/ assessment    Elson Areas, PA-C 11/14/12 1834  Lonia Skinner [place], PA-C 11/14/12 1902  Lonia Skinner [place], [place] 11/14/12 1904

## 2012-11-14 NOTE — ED Notes (Signed)
Patient has had open heart surgery recently.  Patient then went to rehab.  Patient went home yesterday from rehab. Reports nausea present for 5 days prior to discharge.  Reports last night had vomiting/dry heaves.  Chest soreness

## 2012-11-14 NOTE — ED Notes (Signed)
Shuttle not available

## 2012-11-14 NOTE — ED Provider Notes (Signed)
Medical screening examination/treatment/procedure(s) were performed by non-physician practitioner and as supervising physician I was immediately available for consultation/collaboration.  Leslee Home, M.D.  Reuben Likes, MD 11/14/12 2030

## 2012-11-14 NOTE — ED Notes (Addendum)
PT. REPORTS NAUSEA AND VOMITTING FOR SEVERAL DAYS , PT. SUSPECTS TRAMADOL IS THE CAUSE OF HER EMESIS , S/P CABG 3 WEEKS AGO , NO DIARRHEA . EKG DONE AT URGENT CARE .

## 2012-11-14 NOTE — ED Provider Notes (Signed)
History     CSN: [id number]  Arrival date & time 11/14/12  Claudia Howell   First MD Initiated Contact with Patient 11/14/12 2048      Chief Complaint  Patient presents with  . Emesis    (Consider location/radiation/quality/duration/timing/severity/associated sxs/prior treatment) HPI Comments: Patient is three-week status post CABG, triple bypass, who, during her hospitalization, was unable to tolerate oxycodone.  She was discharged from rehabilitation last night, to her home on [address], and has had nausea and vomiting.  Since her arrival at home.  She denies any chest pain, diarrhea, diaphoresis, shortness of breath, leg swelling, but is unable to tolerate fluids.   Patient is a 72 y.o. female presenting with vomiting. The history is provided by the patient.  Emesis Severity:  Moderate Timing:  Intermittent Able to tolerate:  Liquids Progression:  Worsening Chronicity:  New Recent urination:  Normal Worsened by:  Nothing tried Associated symptoms: no abdominal pain, no chills, no cough, no diarrhea, no fever and no myalgias     Past Medical History  Diagnosis Date  . Hypertension     Dr.Henry Katrinka Blazing  . Hyperlipidemia   . Osteopenia 9/09  . Adenomatous colon polyp 10/09  . Chest pain on exertion     "escalating over last few months" (10/16/2012)  . Coronary artery disease   . PAF (paroxysmal atrial fibrillation)     during cath/notes 10/16/2012  . Decubitus ulcer of coccyx 10/22/12    1/2 inch raw open area on coccyx  . S/P CABG x 3 10/22/2012    LIMA to LAD, SVG to distal RCA, SVG to OM, EVH from right thigh  . S/P Maze operation for atrial fibrillation 10/22/2012    Left side lesion set using bipolar radiofrequency and cryothermy ablation with clipping of LA appendage    Past Surgical History  Procedure Laterality Date  . Total abdominal hysterectomy w/ bilateral salpingoophorectomy  1990    benign growth  . Cardiac catheterization  10/16/2012  . Tonsillectomy  1949  .  Abdominal hysterectomy    . Breast cyst excision Left 1980-1990's    x 3  . Coronary artery bypass graft  10/22/2012    Dr Cornelius Moras  . Coronary artery bypass graft N/A 10/22/2012    Procedure: CORONARY ARTERY BYPASS GRAFTING (CABG);  Surgeon: Purcell Nails, MD;  Location: [facility] OR;  Service: Open Heart Surgery;  Laterality: N/A;  . Maze N/A 10/22/2012    Procedure: MAZE;  Surgeon: Purcell Nails, MD;  Location: [facility] OR;  Service: Open Heart Surgery;  Laterality: N/A;  . Intraoperative transesophageal echocardiogram N/A 10/22/2012    Procedure: INTRAOPERATIVE TRANSESOPHAGEAL ECHOCARDIOGRAM;  Surgeon: Purcell Nails, MD;  Location: [facility] OR;  Service: Open Heart Surgery;  Laterality: N/A;    Family History  Problem Relation Age of Onset  . Hypertension Mother   . Heart attack Mother   . Heart disease Mother   . Hypertension Brother   . Cancer Brother     prostate  . Diabetes Neg Hx   . Breast cancer Neg Hx   . Colon cancer Neg Hx     History  Substance Use Topics  . Smoking status: Never Smoker   . Smokeless tobacco: Never Used  . Alcohol Use: 0.6 oz/week    1 Glasses of wine per week     Comment: 1 glass of wine weekly (when playing cards)    OB History   Grav Para Term Preterm Abortions TAB SAB Ect Mult Living  3 2   1  1   2       Review of Systems  Constitutional: Negative for fever and chills.  Respiratory: Negative for cough and shortness of breath.   Cardiovascular: Negative for chest pain and leg swelling.  Gastrointestinal: Positive for vomiting. Negative for abdominal pain, diarrhea and abdominal distention.  Musculoskeletal: Negative for myalgias.  Skin: Negative for wound.  All other systems reviewed and are negative.    Allergies  Review of patient's allergies indicates no known allergies.  Home Medications   Current Outpatient Rx  Name  Route  Sig  Dispense  Refill  . Acetylcarnitine HCl (ACETYL L-CARNITINE) 250 MG CAPS   Oral   Take 1 capsule by mouth  daily.         Marland Kitchen aspirin EC 81 MG EC tablet   Oral   Take 1 tablet (81 mg total) by mouth daily.         Marland Kitchen atenolol (TENORMIN) 100 MG tablet   Oral   Take 100 mg by mouth daily.         . benazepril (LOTENSIN) 40 MG tablet   Oral   Take 40 mg by mouth daily.         . cholecalciferol (VITAMIN D) 1000 UNITS tablet   Oral   Take 1,000 Units by mouth daily.         . Coenzyme Q10 (CO Q 10) 100 MG CAPS   Oral   Take 1 capsule by mouth daily.         . CVS BETA CAROTENE PO   Oral   Take 1 tablet by mouth daily.         . Glucosamine-Chondroitin-MSM 500-400-300 MG TABS   Oral   Take 2 tablets by mouth daily.         . Multiple Vitamins-Minerals (MULTIVITAMIN WITH MINERALS) tablet   Oral   Take 1 tablet by mouth daily.         . Nutritional Supplements (ESTROVEN MAXIMUM STRENGTH) TABS   Oral   Take 1 tablet by mouth daily.         Marland Kitchen warfarin (COUMADIN) 2.5 MG tablet   Oral   Take 1 tablet (2.5 mg total) by mouth daily.         . Zinc 25 MG TABS   Oral   Take 1 tablet by mouth daily.         Marland Kitchen atorvastatin (LIPITOR) 10 MG tablet      TAKE 1 TABLET (10 MG TOTAL) BY MOUTH DAILY.   90 tablet   1   . ondansetron (ZOFRAN ODT) 4 MG disintegrating tablet   Oral   Take 1 tablet (4 mg total) by mouth every 8 (eight) hours as needed for nausea.   20 tablet   0     BP 174/75  Pulse 78  Temp(Src) 98.1 F (36.7 C) (Oral)  Resp 18  SpO2 98%  Physical Exam  Nursing note and vitals reviewed. Constitutional: She is oriented to person, place, and time. She appears well-developed and well-nourished.  Eyes: Pupils are equal, round, and reactive to light.  Neck: Normal range of motion.  Cardiovascular: Normal rate and regular rhythm.   Pulmonary/Chest: Effort normal and breath sounds normal.  Abdominal: Soft. Bowel sounds are normal. She exhibits no distension. There is no tenderness.  Musculoskeletal: Normal range of motion.  Neurological: She  is alert and oriented to person, place, and time.  Skin: Skin is  warm.    ED Course  Procedures (including critical care time)  Labs Reviewed  CBC WITH DIFFERENTIAL - Abnormal; Notable for the following:    RBC 3.79 (*)    Hemoglobin 11.3 (*)    HCT 34.2 (*)    Neutrophils Relative % 86 (*)    Lymphocytes Relative 8 (*)    All other components within normal limits  COMPREHENSIVE METABOLIC PANEL - Abnormal; Notable for the following:    Potassium 3.3 (*)    Glucose, Bld 113 (*)    All other components within normal limits  PROTIME-INR - Abnormal; Notable for the following:    Prothrombin Time 21.2 (*)    INR 1.92 (*)    All other components within normal limits   No results found.   1. Medication reaction, initial encounter   2. Nausea & vomiting     ED ECG REPORT   Date: 11/14/2012  EKG Time: 11:21 PM  Rate: 81  Rhythm: normal sinus rhythm,  unchanged from previous tracings  Axis: right  Intervals:none  ST&T Change: none  Narrative Interpretation: borderline            MDM  Will hydrate control nausea supplement K+         Arman Filter, NP 11/14/12 2321

## 2012-11-15 NOTE — ED Provider Notes (Signed)
Elderly female with history of recent CABG, becoming nauseated every time she takes her treadmill, unable to take her Coumadin because of concern for her nausea and vomiting. On my exam has clear heart, clear lungs, no other concern for acute process, patient appears stable, given antiemetics and no longer has nausea. I personally saw and evaluated the patient and agree with the EKG interpretation by nurse practitioner.  Vida Roller, MD 11/15/12 [phone]

## 2012-11-20 DIAGNOSIS — I4891 Unspecified atrial fibrillation: Secondary | ICD-10-CM | POA: Diagnosis not present

## 2012-11-20 DIAGNOSIS — I2510 Atherosclerotic heart disease of native coronary artery without angina pectoris: Secondary | ICD-10-CM | POA: Diagnosis not present

## 2012-11-20 DIAGNOSIS — E785 Hyperlipidemia, unspecified: Secondary | ICD-10-CM | POA: Diagnosis not present

## 2012-11-20 DIAGNOSIS — I10 Essential (primary) hypertension: Secondary | ICD-10-CM | POA: Diagnosis not present

## 2012-11-20 DIAGNOSIS — Z7901 Long term (current) use of anticoagulants: Secondary | ICD-10-CM | POA: Diagnosis not present

## 2012-11-20 DIAGNOSIS — I209 Angina pectoris, unspecified: Secondary | ICD-10-CM | POA: Diagnosis not present

## 2012-11-21 ENCOUNTER — Other Ambulatory Visit: Payer: Self-pay | Admitting: *Deleted

## 2012-11-21 DIAGNOSIS — Z951 Presence of aortocoronary bypass graft: Secondary | ICD-10-CM

## 2012-11-21 DIAGNOSIS — I4891 Unspecified atrial fibrillation: Secondary | ICD-10-CM

## 2012-11-21 DIAGNOSIS — Z9889 Other specified postprocedural states: Secondary | ICD-10-CM

## 2012-11-24 ENCOUNTER — Ambulatory Visit (INDEPENDENT_AMBULATORY_CARE_PROVIDER_SITE_OTHER): Payer: Self-pay | Admitting: Thoracic Surgery (Cardiothoracic Vascular Surgery)

## 2012-11-24 ENCOUNTER — Encounter: Payer: Self-pay | Admitting: Thoracic Surgery (Cardiothoracic Vascular Surgery)

## 2012-11-24 ENCOUNTER — Ambulatory Visit
Admission: RE | Admit: 2012-11-24 | Discharge: 2012-11-24 | Disposition: A | Discharge status: Home / Self Care | Payer: Medicare Other | Source: Ambulatory Visit | Attending: Thoracic Surgery (Cardiothoracic Vascular Surgery) | Admitting: Thoracic Surgery (Cardiothoracic Vascular Surgery)

## 2012-11-24 VITALS — BP 134/79 | HR 81 | Resp 16 | Ht 67.0 in | Wt 140.0 lb

## 2012-11-24 DIAGNOSIS — Z951 Presence of aortocoronary bypass graft: Secondary | ICD-10-CM

## 2012-11-24 DIAGNOSIS — Z9889 Other specified postprocedural states: Secondary | ICD-10-CM

## 2012-11-24 DIAGNOSIS — I4891 Unspecified atrial fibrillation: Secondary | ICD-10-CM

## 2012-11-24 DIAGNOSIS — Z8679 Personal history of other diseases of the circulatory system: Secondary | ICD-10-CM

## 2012-11-24 DIAGNOSIS — I2510 Atherosclerotic heart disease of native coronary artery without angina pectoris: Secondary | ICD-10-CM

## 2012-11-24 DIAGNOSIS — J984 Other disorders of lung: Secondary | ICD-10-CM | POA: Diagnosis not present

## 2012-11-24 NOTE — Progress Notes (Signed)
[address]       [place] [address]             [phone]     CARDIOTHORACIC SURGERY OFFICE NOTE  Referring Provider is [name], MD PCP is Lavonda Jumbo, MD   HPI:  Patient returns for routine followup status post coronary artery bypass grafting x3 and Maze procedure on 10/22/2012.  Her postoperative recovery was notable for in tolerance of most oral pain relievers because of severe postoperative nausea and vomiting, but this ultimately resolved. She otherwise has done well, and since hospital discharge she has been followed carefully by Dr. Katrinka Blazing. Her prothrombin time is been monitored carefully and Coumadin dose adjusted. She has been restarted on amlodipine and hydrochlorothiazide for hypertension. She returns to the office today and reports that she is getting along quite well. Her only complaint is that of some itching across her upper chest. She takes only Tylenol as needed for pain and she really isn't having much pain at all. She has no shortness of breath. She denies any palpitations or dizzy spells. Overall she has no complaints.   Current Outpatient Prescriptions  Medication Sig Dispense Refill  . acetaminophen (TYLENOL) 650 MG CR tablet Take 650 mg by mouth every 8 (eight) hours as needed for pain.      Marland Kitchen amLODipine (NORVASC) 5 MG tablet Take 5 mg by mouth daily.      Marland Kitchen atenolol (TENORMIN) 100 MG tablet Take 100 mg by mouth daily.      Marland Kitchen atorvastatin (LIPITOR) 10 MG tablet TAKE 1 TABLET (10 MG TOTAL) BY MOUTH DAILY.  90 tablet  1  . benazepril (LOTENSIN) 40 MG tablet Take 40 mg by mouth daily.      . cholecalciferol (VITAMIN D) 1000 UNITS tablet Take 1,000 Units by mouth daily.      . hydrochlorothiazide (MICROZIDE) 12.5 MG capsule Take 12.5 mg by mouth daily.      Marland Kitchen warfarin (COUMADIN) 2.5 MG tablet Take 1 tablet (2.5 mg total) by mouth daily.      . Acetylcarnitine HCl (ACETYL L-CARNITINE) 250 MG CAPS Take 1 capsule by mouth daily.        Marland Kitchen aspirin EC 81 MG EC tablet Take 1 tablet (81 mg total) by mouth daily.      . Coenzyme Q10 (CO Q 10) 100 MG CAPS Take 1 capsule by mouth daily.      . CVS BETA CAROTENE PO Take 1 tablet by mouth daily.      . Glucosamine-Chondroitin-MSM 500-400-300 MG TABS Take 2 tablets by mouth daily.      . Multiple Vitamins-Minerals (MULTIVITAMIN WITH MINERALS) tablet Take 1 tablet by mouth daily.      . Nutritional Supplements (ESTROVEN MAXIMUM STRENGTH) TABS Take 1 tablet by mouth daily.      . ondansetron (ZOFRAN ODT) 4 MG disintegrating tablet Take 1 tablet (4 mg total) by mouth every 8 (eight) hours as needed for nausea.  20 tablet  0  . Zinc 25 MG TABS Take 1 tablet by mouth daily.       No current facility-administered medications for this visit.      Physical Exam:   BP 134/79  Pulse 81  Resp 16  Ht 5\' 7"  (1.702 m)  Wt 140 lb (63.504 kg)  BMI 21.92 kg/m2  SpO2 97%  General:  Well-appearing  Chest:   Clear to auscultation with symmetrical breath sounds  CV:  Regular rate and rhythm without murmur  Incisions:  Clean and dry and healing nicely, sternum is stable  Abdomen:  Soft and nontender  Extremities:  Warm and well-perfused, saphenous vein harvest incision healing nicely  Diagnostic Tests:  2 channel telemetry rhythm strip demonstrates normal sinus rhythm   *RADIOLOGY REPORT*  Clinical Data: Status post CABG 5 weeks ago.  CHEST - 2 VIEW  Comparison: Two-view chest 10/29/2012.  Findings: The patient is status post three-vessel CABG and Maze  procedure. The heart size is normal. A tiny left pleural effusion  is near completely resolved. Minimal left basilar airspace disease  likely reflects atelectasis or less likely scarring. The right  lung is clear. The upper lung fields are clear bilaterally. Mild  over minimal scarring at the apices is stable. The visualized soft  tissues and bony thorax are unremarkable.  IMPRESSION:  1. Status post CABG and Maze procedure.  2.  Near complete resolution of left pleural effusion and basilar  atelectasis.  Original Report Authenticated By: Marin Roberts, M.D.    Impression:  Patient is doing well approximately 5 weeks status post coronary artery bypass grafting and Maze procedure. She is maintaining sinus rhythm and getting along quite nicely.   Plan:  I've encouraged patient to continue to gradually increase her activity as tolerated but I've reminded her to refrain from heavy lifting or strenuous use of arms or shoulders for at least another 2 months. I think she can resume driving an automobile and I've encouraged her to go ahead and get started in the cardiac rehabilitation program. We have not made any changes to her current medications. All of her questions been addressed. We'll have her return in 2 months for routine followup and rhythm check.   Salvatore Decent. Cornelius Moras, MD 11/24/2012 10:46 AM

## 2012-11-24 NOTE — Patient Instructions (Signed)
The patient should continue to avoid any heavy lifting or strenuous use of arms or shoulders for at least a total of three months from the time of surgery.   The patient may return to driving an automobile as long as they are no longer requiring oral narcotic pain relievers during the daytime.  It would be wise to start driving only short distances during the daylight and gradually increase from there as they feel comfortable.  

## 2012-12-04 ENCOUNTER — Encounter (HOSPITAL_COMMUNITY)
Admission: RE | Admit: 2012-12-04 | Discharge: 2012-12-04 | Disposition: A | Discharge status: Home / Self Care | Payer: Medicare Other | Source: Ambulatory Visit | Attending: Interventional Cardiology | Admitting: Interventional Cardiology

## 2012-12-04 DIAGNOSIS — Z5189 Encounter for other specified aftercare: Secondary | ICD-10-CM | POA: Insufficient documentation

## 2012-12-04 DIAGNOSIS — E785 Hyperlipidemia, unspecified: Secondary | ICD-10-CM | POA: Insufficient documentation

## 2012-12-04 DIAGNOSIS — Z951 Presence of aortocoronary bypass graft: Secondary | ICD-10-CM | POA: Insufficient documentation

## 2012-12-04 DIAGNOSIS — Z8249 Family history of ischemic heart disease and other diseases of the circulatory system: Secondary | ICD-10-CM | POA: Insufficient documentation

## 2012-12-04 DIAGNOSIS — I4891 Unspecified atrial fibrillation: Secondary | ICD-10-CM | POA: Insufficient documentation

## 2012-12-04 DIAGNOSIS — I2510 Atherosclerotic heart disease of native coronary artery without angina pectoris: Secondary | ICD-10-CM | POA: Insufficient documentation

## 2012-12-04 DIAGNOSIS — I10 Essential (primary) hypertension: Secondary | ICD-10-CM | POA: Insufficient documentation

## 2012-12-04 NOTE — Progress Notes (Signed)
Cardiac Rehab Medication Review by a Pharmacist  Does the patient  feel that his/her medications are working for him/her?  yes  Has the patient been experiencing any side effects to the medications prescribed?  no  Does the patient measure his/her own blood pressure or blood glucose at home?  yes   Does the patient have any problems obtaining medications due to transportation or finances?   no  Understanding of regimen: excellent Understanding of indications: excellent Potential of compliance: excellent    Pharmacist comments: none    Laurence Slate 12/04/2012 8:52 AM

## 2012-12-08 ENCOUNTER — Encounter (HOSPITAL_COMMUNITY)
Admission: RE | Admit: 2012-12-08 | Discharge: 2012-12-08 | Disposition: A | Discharge status: Home / Self Care | Payer: Medicare Other | Source: Ambulatory Visit | Attending: Interventional Cardiology | Admitting: Interventional Cardiology

## 2012-12-08 DIAGNOSIS — I2510 Atherosclerotic heart disease of native coronary artery without angina pectoris: Secondary | ICD-10-CM | POA: Diagnosis not present

## 2012-12-08 DIAGNOSIS — Z8249 Family history of ischemic heart disease and other diseases of the circulatory system: Secondary | ICD-10-CM | POA: Diagnosis not present

## 2012-12-08 DIAGNOSIS — I4891 Unspecified atrial fibrillation: Secondary | ICD-10-CM | POA: Diagnosis not present

## 2012-12-08 DIAGNOSIS — E785 Hyperlipidemia, unspecified: Secondary | ICD-10-CM | POA: Diagnosis not present

## 2012-12-08 DIAGNOSIS — I10 Essential (primary) hypertension: Secondary | ICD-10-CM | POA: Diagnosis not present

## 2012-12-08 DIAGNOSIS — Z951 Presence of aortocoronary bypass graft: Secondary | ICD-10-CM | POA: Diagnosis not present

## 2012-12-08 DIAGNOSIS — Z5189 Encounter for other specified aftercare: Secondary | ICD-10-CM | POA: Diagnosis not present

## 2012-12-08 NOTE — Progress Notes (Signed)
Claudia Howell started cardiac rehab today. Telemetry rhythm Sinus Rhythm. Vital signs stable. Patient tolerated light exercise without difficulty. Will continue to monitor the patient throughout  the program.

## 2012-12-10 ENCOUNTER — Encounter (HOSPITAL_COMMUNITY)
Admission: RE | Admit: 2012-12-10 | Discharge: 2012-12-10 | Disposition: A | Discharge status: Home / Self Care | Payer: Medicare Other | Source: Ambulatory Visit | Attending: Interventional Cardiology | Admitting: Interventional Cardiology

## 2012-12-11 DIAGNOSIS — Z7901 Long term (current) use of anticoagulants: Secondary | ICD-10-CM | POA: Diagnosis not present

## 2012-12-11 DIAGNOSIS — I4891 Unspecified atrial fibrillation: Secondary | ICD-10-CM | POA: Diagnosis not present

## 2012-12-12 ENCOUNTER — Encounter (HOSPITAL_COMMUNITY)
Admission: RE | Admit: 2012-12-12 | Discharge: 2012-12-12 | Disposition: A | Discharge status: Home / Self Care | Payer: Medicare Other | Source: Ambulatory Visit | Attending: Interventional Cardiology | Admitting: Interventional Cardiology

## 2012-12-15 ENCOUNTER — Encounter (HOSPITAL_COMMUNITY)
Admission: RE | Admit: 2012-12-15 | Discharge: 2012-12-15 | Disposition: A | Discharge status: Home / Self Care | Payer: Medicare Other | Source: Ambulatory Visit | Attending: Interventional Cardiology | Admitting: Interventional Cardiology

## 2012-12-17 ENCOUNTER — Encounter (HOSPITAL_COMMUNITY)
Admission: RE | Admit: 2012-12-17 | Discharge: 2012-12-17 | Disposition: A | Discharge status: Home / Self Care | Payer: Medicare Other | Source: Ambulatory Visit | Attending: Interventional Cardiology | Admitting: Interventional Cardiology

## 2012-12-18 DIAGNOSIS — Z7901 Long term (current) use of anticoagulants: Secondary | ICD-10-CM | POA: Diagnosis not present

## 2012-12-18 DIAGNOSIS — E785 Hyperlipidemia, unspecified: Secondary | ICD-10-CM | POA: Diagnosis not present

## 2012-12-18 DIAGNOSIS — Z79899 Other long term (current) drug therapy: Secondary | ICD-10-CM | POA: Diagnosis not present

## 2012-12-18 DIAGNOSIS — I10 Essential (primary) hypertension: Secondary | ICD-10-CM | POA: Diagnosis not present

## 2012-12-18 DIAGNOSIS — I4891 Unspecified atrial fibrillation: Secondary | ICD-10-CM | POA: Diagnosis not present

## 2012-12-18 DIAGNOSIS — I2510 Atherosclerotic heart disease of native coronary artery without angina pectoris: Secondary | ICD-10-CM | POA: Diagnosis not present

## 2012-12-19 ENCOUNTER — Encounter (HOSPITAL_COMMUNITY)
Admission: RE | Admit: 2012-12-19 | Discharge: 2012-12-19 | Disposition: A | Discharge status: Home / Self Care | Payer: Medicare Other | Source: Ambulatory Visit | Attending: Interventional Cardiology | Admitting: Interventional Cardiology

## 2012-12-22 ENCOUNTER — Encounter (HOSPITAL_COMMUNITY)
Admission: RE | Admit: 2012-12-22 | Discharge: 2012-12-22 | Disposition: A | Discharge status: Home / Self Care | Payer: Medicare Other | Source: Ambulatory Visit | Attending: Interventional Cardiology | Admitting: Interventional Cardiology

## 2012-12-22 NOTE — Progress Notes (Signed)
Reviewed home exercise with pt today.  Pt plans to continue walking at home for exercise.  Reviewed THR, pulse, RPE, sign and symptoms, and when to call 911 or MD.  Pt voiced understanding. Karmon Andis, MA, ACSM RCEP  

## 2012-12-24 ENCOUNTER — Encounter (HOSPITAL_COMMUNITY)
Admission: RE | Admit: 2012-12-24 | Discharge: 2012-12-24 | Disposition: A | Discharge status: Home / Self Care | Payer: Medicare Other | Source: Ambulatory Visit | Attending: Interventional Cardiology | Admitting: Interventional Cardiology

## 2012-12-24 DIAGNOSIS — I10 Essential (primary) hypertension: Secondary | ICD-10-CM | POA: Insufficient documentation

## 2012-12-24 DIAGNOSIS — Z8249 Family history of ischemic heart disease and other diseases of the circulatory system: Secondary | ICD-10-CM | POA: Diagnosis not present

## 2012-12-24 DIAGNOSIS — I4891 Unspecified atrial fibrillation: Secondary | ICD-10-CM | POA: Insufficient documentation

## 2012-12-24 DIAGNOSIS — Z951 Presence of aortocoronary bypass graft: Secondary | ICD-10-CM | POA: Insufficient documentation

## 2012-12-24 DIAGNOSIS — I2510 Atherosclerotic heart disease of native coronary artery without angina pectoris: Secondary | ICD-10-CM | POA: Insufficient documentation

## 2012-12-24 DIAGNOSIS — E785 Hyperlipidemia, unspecified: Secondary | ICD-10-CM | POA: Insufficient documentation

## 2012-12-24 DIAGNOSIS — Z5189 Encounter for other specified aftercare: Secondary | ICD-10-CM | POA: Insufficient documentation

## 2012-12-26 ENCOUNTER — Encounter (HOSPITAL_COMMUNITY): Payer: Medicare Other

## 2012-12-29 ENCOUNTER — Encounter (HOSPITAL_COMMUNITY)
Admission: RE | Admit: 2012-12-29 | Discharge: 2012-12-29 | Disposition: A | Discharge status: Home / Self Care | Payer: Medicare Other | Source: Ambulatory Visit | Attending: Interventional Cardiology | Admitting: Interventional Cardiology

## 2012-12-31 ENCOUNTER — Encounter (HOSPITAL_COMMUNITY)
Admission: RE | Admit: 2012-12-31 | Discharge: 2012-12-31 | Disposition: A | Discharge status: Home / Self Care | Payer: Medicare Other | Source: Ambulatory Visit | Attending: Interventional Cardiology | Admitting: Interventional Cardiology

## 2013-01-01 DIAGNOSIS — I4891 Unspecified atrial fibrillation: Secondary | ICD-10-CM | POA: Diagnosis not present

## 2013-01-01 DIAGNOSIS — Z7901 Long term (current) use of anticoagulants: Secondary | ICD-10-CM | POA: Diagnosis not present

## 2013-01-02 ENCOUNTER — Encounter (HOSPITAL_COMMUNITY)
Admission: RE | Admit: 2013-01-02 | Discharge: 2013-01-02 | Disposition: A | Discharge status: Home / Self Care | Payer: Medicare Other | Source: Ambulatory Visit | Attending: Interventional Cardiology | Admitting: Interventional Cardiology

## 2013-01-05 ENCOUNTER — Encounter (HOSPITAL_COMMUNITY): Payer: Medicare Other

## 2013-01-07 ENCOUNTER — Encounter (HOSPITAL_COMMUNITY): Payer: Medicare Other

## 2013-01-09 ENCOUNTER — Encounter (HOSPITAL_COMMUNITY): Payer: Medicare Other

## 2013-01-12 ENCOUNTER — Encounter (HOSPITAL_COMMUNITY)
Admission: RE | Admit: 2013-01-12 | Discharge: 2013-01-12 | Disposition: A | Discharge status: Home / Self Care | Payer: Medicare Other | Source: Ambulatory Visit | Attending: Interventional Cardiology | Admitting: Interventional Cardiology

## 2013-01-12 NOTE — Progress Notes (Signed)
Claudia Howell 72 y.o. female Nutrition Note Spoke with pt.  Nutrition Plan and Nutrition Survey goals reviewed with pt. Pt is following Step 2 of the Therapeutic Lifestyle Changes diet. According to pt's A1c, pt is pre-diabetic. Pre-diabetes discussed briefly. Pt encouraged to talk with her PCP re: pre-diabetes. Pt is on Coumadin. Pt is aware of the need for consistent vitamin K intake on Coumadin. Pt expressed understanding of the information reviewed. Pt aware of nutrition education classes offered and plans on attending nutrition classes.  Nutrition Diagnosis   Food-and nutrition-related knowledge deficit related to lack of exposure to information as related to diagnosis of: ? CVD ? Pre-DM (A1c 5.7) ?  Nutrition RX/ Estimated Daily Nutrition Needs for: wt maintenance 1650-1850 Kcal, 50-60 gm fat, 10-12 gm sat fat, 1.6-1.9 gm trans-fat, <1500 mg sodium   Nutrition Intervention   Pt's individual nutrition plan including cholesterol goals reviewed with pt.   Benefits of adopting Therapeutic Lifestyle Changes discussed when Medficts reviewed.   Pt to attend the Portion Distortion class   Pt to attend the  ? Nutrition I class                     ? Nutrition II class   Continue client-centered nutrition education by RD, as part of interdisciplinary care.  Goal(s)   Pt to describe the benefit of including fruits, vegetables, whole grains, and low-fat dairy products in a heart healthy meal plan.  Monitor and Evaluate progress toward nutrition goal with team. Nutrition Risk:  Low   Mickle Plumb, M.Ed, RD, LDN, CDE 01/12/2013 10:26 AM

## 2013-01-14 ENCOUNTER — Encounter (HOSPITAL_COMMUNITY)
Admission: RE | Admit: 2013-01-14 | Discharge: 2013-01-14 | Disposition: A | Discharge status: Home / Self Care | Payer: Medicare Other | Source: Ambulatory Visit | Attending: Interventional Cardiology | Admitting: Interventional Cardiology

## 2013-01-16 ENCOUNTER — Encounter (HOSPITAL_COMMUNITY)
Admission: RE | Admit: 2013-01-16 | Discharge: 2013-01-16 | Disposition: A | Discharge status: Home / Self Care | Payer: Medicare Other | Source: Ambulatory Visit | Attending: Interventional Cardiology | Admitting: Interventional Cardiology

## 2013-01-19 ENCOUNTER — Encounter (HOSPITAL_COMMUNITY)
Admission: RE | Admit: 2013-01-19 | Discharge: 2013-01-19 | Disposition: A | Discharge status: Home / Self Care | Payer: Medicare Other | Source: Ambulatory Visit | Attending: Interventional Cardiology | Admitting: Interventional Cardiology

## 2013-01-21 ENCOUNTER — Encounter (HOSPITAL_COMMUNITY)
Admission: RE | Admit: 2013-01-21 | Discharge: 2013-01-21 | Disposition: A | Discharge status: Home / Self Care | Payer: Medicare Other | Source: Ambulatory Visit | Attending: Interventional Cardiology | Admitting: Interventional Cardiology

## 2013-01-23 ENCOUNTER — Encounter (HOSPITAL_COMMUNITY)
Admission: RE | Admit: 2013-01-23 | Discharge: 2013-01-23 | Disposition: A | Discharge status: Home / Self Care | Payer: Medicare Other | Source: Ambulatory Visit | Attending: Interventional Cardiology | Admitting: Interventional Cardiology

## 2013-01-23 DIAGNOSIS — Z951 Presence of aortocoronary bypass graft: Secondary | ICD-10-CM | POA: Diagnosis not present

## 2013-01-23 DIAGNOSIS — I2510 Atherosclerotic heart disease of native coronary artery without angina pectoris: Secondary | ICD-10-CM | POA: Diagnosis not present

## 2013-01-23 DIAGNOSIS — I10 Essential (primary) hypertension: Secondary | ICD-10-CM | POA: Diagnosis not present

## 2013-01-23 DIAGNOSIS — Z8249 Family history of ischemic heart disease and other diseases of the circulatory system: Secondary | ICD-10-CM | POA: Insufficient documentation

## 2013-01-23 DIAGNOSIS — E785 Hyperlipidemia, unspecified: Secondary | ICD-10-CM | POA: Insufficient documentation

## 2013-01-23 DIAGNOSIS — Z5189 Encounter for other specified aftercare: Secondary | ICD-10-CM | POA: Insufficient documentation

## 2013-01-23 DIAGNOSIS — I4891 Unspecified atrial fibrillation: Secondary | ICD-10-CM | POA: Diagnosis not present

## 2013-01-26 ENCOUNTER — Encounter (HOSPITAL_COMMUNITY)
Admission: RE | Admit: 2013-01-26 | Discharge: 2013-01-26 | Disposition: A | Discharge status: Home / Self Care | Payer: Medicare Other | Source: Ambulatory Visit | Attending: Interventional Cardiology | Admitting: Interventional Cardiology

## 2013-01-28 ENCOUNTER — Encounter (HOSPITAL_COMMUNITY)
Admission: RE | Admit: 2013-01-28 | Discharge: 2013-01-28 | Disposition: A | Discharge status: Home / Self Care | Payer: Medicare Other | Source: Ambulatory Visit | Attending: Interventional Cardiology | Admitting: Interventional Cardiology

## 2013-01-29 DIAGNOSIS — I4891 Unspecified atrial fibrillation: Secondary | ICD-10-CM | POA: Diagnosis not present

## 2013-01-29 DIAGNOSIS — Z7901 Long term (current) use of anticoagulants: Secondary | ICD-10-CM | POA: Diagnosis not present

## 2013-01-30 ENCOUNTER — Encounter (HOSPITAL_COMMUNITY)
Admission: RE | Admit: 2013-01-30 | Discharge: 2013-01-30 | Disposition: A | Discharge status: Home / Self Care | Payer: Medicare Other | Source: Ambulatory Visit | Attending: Interventional Cardiology | Admitting: Interventional Cardiology

## 2013-02-02 ENCOUNTER — Encounter (HOSPITAL_COMMUNITY)
Admission: RE | Admit: 2013-02-02 | Discharge: 2013-02-02 | Disposition: A | Discharge status: Home / Self Care | Payer: Medicare Other | Source: Ambulatory Visit | Attending: Interventional Cardiology | Admitting: Interventional Cardiology

## 2013-02-02 ENCOUNTER — Ambulatory Visit (INDEPENDENT_AMBULATORY_CARE_PROVIDER_SITE_OTHER): Payer: Medicare Other | Admitting: Thoracic Surgery (Cardiothoracic Vascular Surgery)

## 2013-02-02 ENCOUNTER — Encounter: Payer: Self-pay | Admitting: Thoracic Surgery (Cardiothoracic Vascular Surgery)

## 2013-02-02 VITALS — BP 125/75 | HR 70 | Resp 18 | Ht 66.0 in | Wt 141.0 lb

## 2013-02-02 DIAGNOSIS — Z951 Presence of aortocoronary bypass graft: Secondary | ICD-10-CM | POA: Diagnosis not present

## 2013-02-02 DIAGNOSIS — Z9889 Other specified postprocedural states: Secondary | ICD-10-CM | POA: Diagnosis not present

## 2013-02-02 DIAGNOSIS — I2510 Atherosclerotic heart disease of native coronary artery without angina pectoris: Secondary | ICD-10-CM | POA: Diagnosis not present

## 2013-02-02 DIAGNOSIS — Z8679 Personal history of other diseases of the circulatory system: Secondary | ICD-10-CM

## 2013-02-02 NOTE — Progress Notes (Signed)
      [address]       [place] [address]             [phone]     CARDIOTHORACIC SURGERY OFFICE NOTE  Referring Provider is [name], MD PCP is Lavonda Jumbo, MD   HPI:  Patient returns for routine followup status post coronary artery bypass grafting x3 and Maze procedure on 10/22/2012.  She was last seen here in the office on 11/24/2012. Since then she has done very well. She has been actively participating in the cardiac rehabilitation program and she has made excellent progress. She notes that she feels much better than she did prior to surgery here she no longer has any sort of pain in her chest. Her exercise tolerance is quite good. She denies any shortness of breath. She has not had any palpitations or other signs to suggest recurrence of atrial fibrillation. She's not having problems with Coumadin therapy. She has been scheduled to undergo a Holter monitor sometime in September and possibly stop Coumadin after that.   Current Outpatient Prescriptions  Medication Sig Dispense Refill  . acetaminophen (TYLENOL) 650 MG CR tablet Take 650 mg by mouth every 8 (eight) hours as needed for pain.      Marland Kitchen atenolol (TENORMIN) 100 MG tablet Take 100 mg by mouth daily.      Marland Kitchen atorvastatin (LIPITOR) 10 MG tablet TAKE 1 TABLET (10 MG TOTAL) BY MOUTH DAILY.  90 tablet  1  . benazepril (LOTENSIN) 40 MG tablet Take 40 mg by mouth daily.      . beta carotene w/minerals (OCUVITE) tablet Take 1 tablet by mouth daily.      . Calcium Carb-Cholecalciferol (CALCIUM 1000 + D PO) Take by mouth daily.      . cholecalciferol (VITAMIN D) 1000 UNITS tablet Take 1,000 Units by mouth daily.      . hydrochlorothiazide (MICROZIDE) 12.5 MG capsule Take 12.5 mg by mouth daily.      . Multiple Vitamin (MULTIVITAMIN) tablet Take 1 tablet by mouth daily.      Marland Kitchen warfarin (COUMADIN) 2.5 MG tablet Take 1-2 mg by mouth daily. Take 2mg  daily except 1mg  on Tues, Thurs, and Sat       No current  facility-administered medications for this visit.      Physical Exam:   BP 125/75  Pulse 70  Resp 18  Ht 5\' 6"  (1.676 m)  Wt 141 lb (63.957 kg)  BMI 22.77 kg/m2  SpO2 98%  General:  Well-appearing  Chest:   Clear to auscultation  CV:   Regular rate and rhythm  Incisions:  Completely healed, sternum is stable  Abdomen:  Soft and nontender  Extremities:  Warm and well-perfused  Diagnostic Tests:  2 channel telemetry rhythm strip demonstrates normal sinus rhythm   Impression:  Patient is doing exceptionally well 3 months following coronary artery bypass grafting and Maze procedure. She is maintaining sinus rhythm.    Plan:  The patient is scheduled to undergo Holter monitor in September. I would agree with stopping Coumadin at that time if there is no sign of significant recurrent A. Fib. We will have the patient return for followup in 3 months to review the results of her Holter monitor.   Salvatore Decent. Cornelius Moras, MD 02/02/2013 2:51 PM

## 2013-02-02 NOTE — Progress Notes (Signed)
[place] has an appointment with Dr Cornelius Moras this afternoon. Will fax exercise flow sheets to Dr. Barry Dienes office for review.

## 2013-02-04 ENCOUNTER — Encounter (HOSPITAL_COMMUNITY)
Admission: RE | Admit: 2013-02-04 | Discharge: 2013-02-04 | Disposition: A | Discharge status: Home / Self Care | Payer: Medicare Other | Source: Ambulatory Visit | Attending: Interventional Cardiology | Admitting: Interventional Cardiology

## 2013-02-06 ENCOUNTER — Encounter (HOSPITAL_COMMUNITY)
Admission: RE | Admit: 2013-02-06 | Discharge: 2013-02-06 | Disposition: A | Discharge status: Home / Self Care | Payer: Medicare Other | Source: Ambulatory Visit | Attending: Interventional Cardiology | Admitting: Interventional Cardiology

## 2013-02-09 ENCOUNTER — Encounter (HOSPITAL_COMMUNITY)
Admission: RE | Admit: 2013-02-09 | Discharge: 2013-02-09 | Disposition: A | Discharge status: Home / Self Care | Payer: Medicare Other | Source: Ambulatory Visit | Attending: Interventional Cardiology | Admitting: Interventional Cardiology

## 2013-02-11 ENCOUNTER — Encounter (HOSPITAL_COMMUNITY)
Admission: RE | Admit: 2013-02-11 | Discharge: 2013-02-11 | Disposition: A | Discharge status: Home / Self Care | Payer: Medicare Other | Source: Ambulatory Visit | Attending: Interventional Cardiology | Admitting: Interventional Cardiology

## 2013-02-13 ENCOUNTER — Encounter (HOSPITAL_COMMUNITY)
Admission: RE | Admit: 2013-02-13 | Discharge: 2013-02-13 | Disposition: A | Discharge status: Home / Self Care | Payer: Medicare Other | Source: Ambulatory Visit | Attending: Interventional Cardiology | Admitting: Interventional Cardiology

## 2013-02-16 ENCOUNTER — Encounter (HOSPITAL_COMMUNITY)
Admission: RE | Admit: 2013-02-16 | Discharge: 2013-02-16 | Disposition: A | Discharge status: Home / Self Care | Payer: Medicare Other | Source: Ambulatory Visit | Attending: Interventional Cardiology | Admitting: Interventional Cardiology

## 2013-02-18 ENCOUNTER — Encounter (HOSPITAL_COMMUNITY)
Admission: RE | Admit: 2013-02-18 | Discharge: 2013-02-18 | Disposition: A | Discharge status: Home / Self Care | Payer: Medicare Other | Source: Ambulatory Visit | Attending: Interventional Cardiology | Admitting: Interventional Cardiology

## 2013-02-19 DIAGNOSIS — Z7901 Long term (current) use of anticoagulants: Secondary | ICD-10-CM | POA: Diagnosis not present

## 2013-02-19 DIAGNOSIS — I4891 Unspecified atrial fibrillation: Secondary | ICD-10-CM | POA: Diagnosis not present

## 2013-02-20 ENCOUNTER — Encounter (HOSPITAL_COMMUNITY)
Admission: RE | Admit: 2013-02-20 | Discharge: 2013-02-20 | Disposition: A | Discharge status: Home / Self Care | Payer: Medicare Other | Source: Ambulatory Visit | Attending: Interventional Cardiology | Admitting: Interventional Cardiology

## 2013-02-23 ENCOUNTER — Encounter (HOSPITAL_COMMUNITY): Payer: Medicare Other

## 2013-02-23 DIAGNOSIS — Z8249 Family history of ischemic heart disease and other diseases of the circulatory system: Secondary | ICD-10-CM | POA: Insufficient documentation

## 2013-02-23 DIAGNOSIS — I4891 Unspecified atrial fibrillation: Secondary | ICD-10-CM | POA: Insufficient documentation

## 2013-02-23 DIAGNOSIS — I2510 Atherosclerotic heart disease of native coronary artery without angina pectoris: Secondary | ICD-10-CM | POA: Insufficient documentation

## 2013-02-23 DIAGNOSIS — Z951 Presence of aortocoronary bypass graft: Secondary | ICD-10-CM | POA: Insufficient documentation

## 2013-02-23 DIAGNOSIS — E785 Hyperlipidemia, unspecified: Secondary | ICD-10-CM | POA: Insufficient documentation

## 2013-02-23 DIAGNOSIS — I10 Essential (primary) hypertension: Secondary | ICD-10-CM | POA: Insufficient documentation

## 2013-02-23 DIAGNOSIS — Z5189 Encounter for other specified aftercare: Secondary | ICD-10-CM | POA: Insufficient documentation

## 2013-02-24 DIAGNOSIS — I4891 Unspecified atrial fibrillation: Secondary | ICD-10-CM | POA: Diagnosis not present

## 2013-02-25 ENCOUNTER — Encounter (HOSPITAL_COMMUNITY): Admission: RE | Admit: 2013-02-25 | Payer: Medicare Other | Source: Ambulatory Visit

## 2013-02-25 DIAGNOSIS — Z7901 Long term (current) use of anticoagulants: Secondary | ICD-10-CM | POA: Diagnosis not present

## 2013-02-25 DIAGNOSIS — I4891 Unspecified atrial fibrillation: Secondary | ICD-10-CM | POA: Diagnosis not present

## 2013-02-25 DIAGNOSIS — E782 Mixed hyperlipidemia: Secondary | ICD-10-CM | POA: Diagnosis not present

## 2013-02-25 DIAGNOSIS — I10 Essential (primary) hypertension: Secondary | ICD-10-CM | POA: Diagnosis not present

## 2013-02-25 DIAGNOSIS — I2510 Atherosclerotic heart disease of native coronary artery without angina pectoris: Secondary | ICD-10-CM | POA: Diagnosis not present

## 2013-02-27 ENCOUNTER — Encounter (HOSPITAL_COMMUNITY)
Admission: RE | Admit: 2013-02-27 | Discharge: 2013-02-27 | Disposition: A | Discharge status: Home / Self Care | Payer: Medicare Other | Source: Ambulatory Visit | Attending: Interventional Cardiology | Admitting: Interventional Cardiology

## 2013-02-27 DIAGNOSIS — I4891 Unspecified atrial fibrillation: Secondary | ICD-10-CM | POA: Diagnosis not present

## 2013-02-27 DIAGNOSIS — Z951 Presence of aortocoronary bypass graft: Secondary | ICD-10-CM | POA: Diagnosis not present

## 2013-02-27 DIAGNOSIS — I2510 Atherosclerotic heart disease of native coronary artery without angina pectoris: Secondary | ICD-10-CM | POA: Diagnosis not present

## 2013-02-27 DIAGNOSIS — Z5189 Encounter for other specified aftercare: Secondary | ICD-10-CM | POA: Diagnosis not present

## 2013-02-27 DIAGNOSIS — E785 Hyperlipidemia, unspecified: Secondary | ICD-10-CM | POA: Diagnosis not present

## 2013-02-27 DIAGNOSIS — Z8249 Family history of ischemic heart disease and other diseases of the circulatory system: Secondary | ICD-10-CM | POA: Diagnosis not present

## 2013-02-27 DIAGNOSIS — I10 Essential (primary) hypertension: Secondary | ICD-10-CM | POA: Diagnosis not present

## 2013-02-27 NOTE — Progress Notes (Signed)
Claudia Howell is wearing a 30 day holter monitor per Dr Katrinka Blazing. No complaints during exercise today. Will continue to monitor the patient throughout  the program.

## 2013-03-02 ENCOUNTER — Encounter (HOSPITAL_COMMUNITY)
Admission: RE | Admit: 2013-03-02 | Discharge: 2013-03-02 | Disposition: A | Discharge status: Home / Self Care | Payer: Medicare Other | Source: Ambulatory Visit | Attending: Interventional Cardiology | Admitting: Interventional Cardiology

## 2013-03-02 DIAGNOSIS — Z5189 Encounter for other specified aftercare: Secondary | ICD-10-CM | POA: Diagnosis not present

## 2013-03-02 DIAGNOSIS — E785 Hyperlipidemia, unspecified: Secondary | ICD-10-CM | POA: Diagnosis not present

## 2013-03-02 DIAGNOSIS — I2510 Atherosclerotic heart disease of native coronary artery without angina pectoris: Secondary | ICD-10-CM | POA: Diagnosis not present

## 2013-03-02 DIAGNOSIS — Z951 Presence of aortocoronary bypass graft: Secondary | ICD-10-CM | POA: Diagnosis not present

## 2013-03-02 DIAGNOSIS — I10 Essential (primary) hypertension: Secondary | ICD-10-CM | POA: Diagnosis not present

## 2013-03-02 DIAGNOSIS — I4891 Unspecified atrial fibrillation: Secondary | ICD-10-CM | POA: Diagnosis not present

## 2013-03-04 ENCOUNTER — Encounter (HOSPITAL_COMMUNITY)
Admission: RE | Admit: 2013-03-04 | Discharge: 2013-03-04 | Disposition: A | Discharge status: Home / Self Care | Payer: Medicare Other | Source: Ambulatory Visit | Attending: Interventional Cardiology | Admitting: Interventional Cardiology

## 2013-03-04 DIAGNOSIS — I10 Essential (primary) hypertension: Secondary | ICD-10-CM | POA: Diagnosis not present

## 2013-03-04 DIAGNOSIS — Z951 Presence of aortocoronary bypass graft: Secondary | ICD-10-CM | POA: Diagnosis not present

## 2013-03-04 DIAGNOSIS — E785 Hyperlipidemia, unspecified: Secondary | ICD-10-CM | POA: Diagnosis not present

## 2013-03-04 DIAGNOSIS — I4891 Unspecified atrial fibrillation: Secondary | ICD-10-CM | POA: Diagnosis not present

## 2013-03-04 DIAGNOSIS — I2510 Atherosclerotic heart disease of native coronary artery without angina pectoris: Secondary | ICD-10-CM | POA: Diagnosis not present

## 2013-03-04 DIAGNOSIS — Z5189 Encounter for other specified aftercare: Secondary | ICD-10-CM | POA: Diagnosis not present

## 2013-03-06 ENCOUNTER — Encounter (HOSPITAL_COMMUNITY)
Admission: RE | Admit: 2013-03-06 | Discharge: 2013-03-06 | Disposition: A | Discharge status: Home / Self Care | Payer: Medicare Other | Source: Ambulatory Visit | Attending: Interventional Cardiology | Admitting: Interventional Cardiology

## 2013-03-06 DIAGNOSIS — I10 Essential (primary) hypertension: Secondary | ICD-10-CM | POA: Diagnosis not present

## 2013-03-06 DIAGNOSIS — Z951 Presence of aortocoronary bypass graft: Secondary | ICD-10-CM | POA: Diagnosis not present

## 2013-03-06 DIAGNOSIS — E785 Hyperlipidemia, unspecified: Secondary | ICD-10-CM | POA: Diagnosis not present

## 2013-03-06 DIAGNOSIS — I4891 Unspecified atrial fibrillation: Secondary | ICD-10-CM | POA: Diagnosis not present

## 2013-03-06 DIAGNOSIS — Z5189 Encounter for other specified aftercare: Secondary | ICD-10-CM | POA: Diagnosis not present

## 2013-03-06 DIAGNOSIS — I2510 Atherosclerotic heart disease of native coronary artery without angina pectoris: Secondary | ICD-10-CM | POA: Diagnosis not present

## 2013-03-09 ENCOUNTER — Encounter (HOSPITAL_COMMUNITY)
Admission: RE | Admit: 2013-03-09 | Discharge: 2013-03-09 | Disposition: A | Discharge status: Home / Self Care | Payer: Medicare Other | Source: Ambulatory Visit | Attending: Interventional Cardiology | Admitting: Interventional Cardiology

## 2013-03-09 DIAGNOSIS — I4891 Unspecified atrial fibrillation: Secondary | ICD-10-CM | POA: Diagnosis not present

## 2013-03-09 DIAGNOSIS — Z951 Presence of aortocoronary bypass graft: Secondary | ICD-10-CM | POA: Diagnosis not present

## 2013-03-09 DIAGNOSIS — I2510 Atherosclerotic heart disease of native coronary artery without angina pectoris: Secondary | ICD-10-CM | POA: Diagnosis not present

## 2013-03-09 DIAGNOSIS — Z5189 Encounter for other specified aftercare: Secondary | ICD-10-CM | POA: Diagnosis not present

## 2013-03-09 DIAGNOSIS — E785 Hyperlipidemia, unspecified: Secondary | ICD-10-CM | POA: Diagnosis not present

## 2013-03-09 DIAGNOSIS — I10 Essential (primary) hypertension: Secondary | ICD-10-CM | POA: Diagnosis not present

## 2013-03-11 ENCOUNTER — Encounter (HOSPITAL_COMMUNITY)
Admission: RE | Admit: 2013-03-11 | Discharge: 2013-03-11 | Disposition: A | Discharge status: Home / Self Care | Payer: Medicare Other | Source: Ambulatory Visit | Attending: Interventional Cardiology | Admitting: Interventional Cardiology

## 2013-03-11 DIAGNOSIS — I10 Essential (primary) hypertension: Secondary | ICD-10-CM | POA: Diagnosis not present

## 2013-03-11 DIAGNOSIS — I4891 Unspecified atrial fibrillation: Secondary | ICD-10-CM | POA: Diagnosis not present

## 2013-03-11 DIAGNOSIS — Z951 Presence of aortocoronary bypass graft: Secondary | ICD-10-CM | POA: Diagnosis not present

## 2013-03-11 DIAGNOSIS — Z5189 Encounter for other specified aftercare: Secondary | ICD-10-CM | POA: Diagnosis not present

## 2013-03-11 DIAGNOSIS — I2510 Atherosclerotic heart disease of native coronary artery without angina pectoris: Secondary | ICD-10-CM | POA: Diagnosis not present

## 2013-03-11 DIAGNOSIS — E785 Hyperlipidemia, unspecified: Secondary | ICD-10-CM | POA: Diagnosis not present

## 2013-03-12 DIAGNOSIS — I4891 Unspecified atrial fibrillation: Secondary | ICD-10-CM | POA: Diagnosis not present

## 2013-03-12 DIAGNOSIS — Z7901 Long term (current) use of anticoagulants: Secondary | ICD-10-CM | POA: Diagnosis not present

## 2013-03-13 ENCOUNTER — Encounter (HOSPITAL_COMMUNITY): Payer: Medicare Other

## 2013-03-16 ENCOUNTER — Encounter (HOSPITAL_COMMUNITY)
Admission: RE | Admit: 2013-03-16 | Discharge: 2013-03-16 | Disposition: A | Discharge status: Home / Self Care | Payer: Medicare Other | Source: Ambulatory Visit | Attending: Interventional Cardiology | Admitting: Interventional Cardiology

## 2013-03-16 DIAGNOSIS — I4891 Unspecified atrial fibrillation: Secondary | ICD-10-CM | POA: Diagnosis not present

## 2013-03-16 DIAGNOSIS — Z5189 Encounter for other specified aftercare: Secondary | ICD-10-CM | POA: Diagnosis not present

## 2013-03-16 DIAGNOSIS — Z951 Presence of aortocoronary bypass graft: Secondary | ICD-10-CM | POA: Diagnosis not present

## 2013-03-16 DIAGNOSIS — E785 Hyperlipidemia, unspecified: Secondary | ICD-10-CM | POA: Diagnosis not present

## 2013-03-16 DIAGNOSIS — I2510 Atherosclerotic heart disease of native coronary artery without angina pectoris: Secondary | ICD-10-CM | POA: Diagnosis not present

## 2013-03-16 DIAGNOSIS — I10 Essential (primary) hypertension: Secondary | ICD-10-CM | POA: Diagnosis not present

## 2013-03-16 NOTE — Progress Notes (Signed)
[employer] graduates today and plans to continue exercise on her own at a local gym.

## 2013-03-26 ENCOUNTER — Ambulatory Visit (INDEPENDENT_AMBULATORY_CARE_PROVIDER_SITE_OTHER): Payer: Medicare Other | Admitting: Pharmacist

## 2013-03-26 DIAGNOSIS — I4891 Unspecified atrial fibrillation: Secondary | ICD-10-CM

## 2013-03-26 LAB — POCT INR: INR: 2.3

## 2013-03-30 DIAGNOSIS — I4891 Unspecified atrial fibrillation: Secondary | ICD-10-CM | POA: Diagnosis not present

## 2013-04-01 HISTORY — PX: CORONARY ANGIOPLASTY WITH STENT PLACEMENT: SHX49

## 2013-04-22 ENCOUNTER — Other Ambulatory Visit (INDEPENDENT_AMBULATORY_CARE_PROVIDER_SITE_OTHER): Payer: Medicare Other

## 2013-04-22 DIAGNOSIS — Z23 Encounter for immunization: Secondary | ICD-10-CM | POA: Diagnosis not present

## 2013-04-26 ENCOUNTER — Other Ambulatory Visit: Payer: Self-pay | Admitting: Family Medicine

## 2013-05-04 ENCOUNTER — Ambulatory Visit (INDEPENDENT_AMBULATORY_CARE_PROVIDER_SITE_OTHER): Payer: Medicare Other | Admitting: Thoracic Surgery (Cardiothoracic Vascular Surgery)

## 2013-05-04 ENCOUNTER — Encounter: Payer: Self-pay | Admitting: Thoracic Surgery (Cardiothoracic Vascular Surgery)

## 2013-05-04 VITALS — BP 141/75 | HR 73 | Resp 15 | Ht 67.0 in | Wt 132.0 lb

## 2013-05-04 DIAGNOSIS — Z951 Presence of aortocoronary bypass graft: Secondary | ICD-10-CM | POA: Diagnosis not present

## 2013-05-04 DIAGNOSIS — I4891 Unspecified atrial fibrillation: Secondary | ICD-10-CM

## 2013-05-04 DIAGNOSIS — Z9889 Other specified postprocedural states: Secondary | ICD-10-CM

## 2013-05-04 DIAGNOSIS — I2510 Atherosclerotic heart disease of native coronary artery without angina pectoris: Secondary | ICD-10-CM

## 2013-05-04 DIAGNOSIS — Z8679 Personal history of other diseases of the circulatory system: Secondary | ICD-10-CM

## 2013-05-04 MED ORDER — ASPIRIN EC 81 MG PO TBEC: 81.0000 mg | DELAYED_RELEASE_TABLET | Freq: Every day | ORAL | Status: DC

## 2013-05-04 NOTE — Patient Instructions (Signed)
Begin taking aspirin 81 mg daily  Patient may resume normal physical activity without any particular limitations at this time.

## 2013-05-04 NOTE — Progress Notes (Signed)
      [address]       [place] [address]             [phone]     CARDIOTHORACIC SURGERY OFFICE NOTE  Referring Provider is [name], MD PCP is Lavonda Jumbo, MD   HPI:  Patient returns for routine followup status post coronary artery bypass grafting x3 and Maze procedure on 10/22/2012. She was last seen here in the office on 02/02/2013.  Since then she underwent a 30 day event monitor which was reported by Dr. Katrinka Blazing on 03/30/2013. She maintaining sinus rhythm without any atrial fibrillation. She has been taken off of Coumadin. Clinically the patient reports doing very well. She has enjoyed normal exercise tolerance. She has no shortness of breath. She denies any palpitations or dizzy spells. The remainder of her review of systems is unremarkable.    Current Outpatient Prescriptions  Medication Sig Dispense Refill  . acetaminophen (TYLENOL) 650 MG CR tablet Take 650 mg by mouth every 8 (eight) hours as needed for pain.      Marland Kitchen atenolol (TENORMIN) 100 MG tablet Take 100 mg by mouth daily.      Marland Kitchen atorvastatin (LIPITOR) 10 MG tablet TAKE 1 TABLET (10 MG TOTAL) BY MOUTH DAILY.  90 tablet  0  . benazepril (LOTENSIN) 40 MG tablet Take 40 mg by mouth daily.      . beta carotene w/minerals (OCUVITE) tablet Take 1 tablet by mouth daily.      . Calcium Carb-Cholecalciferol (CALCIUM 1000 + D PO) Take by mouth daily.      . cholecalciferol (VITAMIN D) 1000 UNITS tablet Take 1,000 Units by mouth daily.      . hydrochlorothiazide (MICROZIDE) 12.5 MG capsule Take 12.5 mg by mouth daily.      . Multiple Vitamin (MULTIVITAMIN) tablet Take 1 tablet by mouth daily.       No current facility-administered medications for this visit.      Physical Exam:   BP 141/75  Pulse 73  Resp 15  Ht 5\' 7"  (1.702 m)  Wt 132 lb (59.875 kg)  BMI 20.67 kg/m2  SpO2 97%  General:  Well-appearing  Chest:   Clear to auscultation  CV:   Regular rate and  rhythm  Incisions:  Completely healed, sternum is stable  Abdomen:  Soft and nontender  Extremities:  Warm and well-perfused  Diagnostic Tests:  2 channel telemetry rhythm strip demonstrates normal sinus rhythm.  Results of 30 day event monitor reported 03/30/2013 is notable for the absence of any atrial fibrillation. The patient maintained sinus rhythm throughout.   Impression:  Patient is doing exceptionally well 6 months status post coronary artery bypass grafting and Maze procedure. She is maintaining sinus rhythm. She has been taken off of Coumadin. She is currently not taking aspirin.    Plan:  I've recommended that the patient begin taking aspirin 81 mg daily. She will return in 6 months for routine followup and rhythm check.   Salvatore Decent. Cornelius Moras, MD 05/04/2013 1:14 PM

## 2013-05-25 ENCOUNTER — Telehealth: Payer: Self-pay | Admitting: Interventional Cardiology

## 2013-05-25 NOTE — Telephone Encounter (Signed)
New Message  Pt called -- previously had a tipple bypass a couple of months ago --- states that she is experiencing the same symptoms as before such as Pain upon exertion// Made appt for 12/3//

## 2013-05-27 ENCOUNTER — Encounter: Payer: Self-pay | Admitting: Interventional Cardiology

## 2013-05-27 ENCOUNTER — Ambulatory Visit (INDEPENDENT_AMBULATORY_CARE_PROVIDER_SITE_OTHER): Payer: Medicare Other | Admitting: Interventional Cardiology

## 2013-05-27 VITALS — BP 138/78 | HR 67 | Ht 67.0 in | Wt 132.0 lb

## 2013-05-27 DIAGNOSIS — I209 Angina pectoris, unspecified: Secondary | ICD-10-CM

## 2013-05-27 DIAGNOSIS — E785 Hyperlipidemia, unspecified: Secondary | ICD-10-CM

## 2013-05-27 DIAGNOSIS — I480 Paroxysmal atrial fibrillation: Secondary | ICD-10-CM

## 2013-05-27 DIAGNOSIS — R079 Chest pain, unspecified: Secondary | ICD-10-CM | POA: Diagnosis not present

## 2013-05-27 DIAGNOSIS — I4891 Unspecified atrial fibrillation: Secondary | ICD-10-CM | POA: Diagnosis not present

## 2013-05-27 DIAGNOSIS — Z951 Presence of aortocoronary bypass graft: Secondary | ICD-10-CM

## 2013-05-27 MED ORDER — ISOSORBIDE MONONITRATE ER 60 MG PO TB24: 60.0000 mg | ORAL_TABLET | Freq: Every day | ORAL | Status: DC

## 2013-05-27 NOTE — Progress Notes (Signed)
Patient ID: Claudia Howell, female   DOB: 01/27/1941, 72 y.o.   MRN: [id number] Past Medical History  Hypertension (Dr. Tripp Goins)   Hyperlipidemia   G3, P2, SAB x1, NSVD x2   OSTEOPENIA(02/2008)   Adenomatous colon polyp (03/2008)   vitamin D deficiency (06/2009)--mild   CABG with Maze, April 2014   Paroxysmal atrial fibrillation      [address] [place], [place]  [address] Phone: [phone] Fax:  [phone]  Date:  05/27/2013   ID:  Mashawn Woolford, DOB 07/05/1940, MRN [id number]  PCP:  KNAPP,EVE A, MD   ASSESSMENT:  1. Recurrent angina post coronary bypass graft 2. CABG, April 2014 3. History of paroxysmal atrial fibrillation, without recurrence since Maze procedure at time of coronary bypass grafting 4. Hypertension, controlled 5. Hyperlipidemia  PLAN:  1. Imdur 60 mg daily 2. Coronary angiography within the next 5 days. 3. Decreased activities has stable lower anginal threshold. 4. Report to the emergency room if prolonged chest discomfort not relieved by rest or sublingual nitroglycerin   SUBJECTIVE: Claudia Howell is a 72 y.o. female who has a three-week history of severe arm and upper chest discomfort which is very similar to her pre-bypass symptoms. The initial episode occurred at rest. Since the episode occurring at rest 3 weeks ago, she has angina with minimal physical activity. She is use nitroglycerin with relief. Rest relieves the discomfort. She has continued to attempt to exercise.   Wt Readings from Last 3 Encounters:  05/27/13 132 lb (59.875 kg)  05/04/13 132 lb (59.875 kg)  02/02/13 141 lb (63.957 kg)     Past Medical History  Diagnosis Date  . Hypertension     Dr.Syncere Kaminski  . Hyperlipidemia   . Osteopenia 9/09  . Adenomatous colon polyp 10/09  . Chest pain on exertion     "escalating over last few months" (10/16/2012)  . Coronary artery disease   . PAF (paroxysmal atrial fibrillation)     during cath/notes 10/16/2012  .  Decubitus ulcer of coccyx 10/22/12    1/2 inch raw open area on coccyx  . S/P CABG x 3 10/22/2012    LIMA to LAD, SVG to distal RCA, SVG to OM, EVH from right thigh  . S/P Maze operation for atrial fibrillation 10/22/2012    Left side lesion set using bipolar radiofrequency and cryothermy ablation with clipping of LA appendage    Current Outpatient Prescriptions  Medication Sig Dispense Refill  . aspirin EC 81 MG tablet Take 1 tablet (81 mg total) by mouth daily.      . atenolol (TENORMIN) 100 MG tablet Take 100 mg by mouth daily.      . atorvastatin (LIPITOR) 10 MG tablet TAKE 1 TABLET (10 MG TOTAL) BY MOUTH DAILY.  90 tablet  0  . benazepril (LOTENSIN) 40 MG tablet Take 40 mg by mouth daily.      . beta carotene w/minerals (OCUVITE) tablet Take 1 tablet by mouth daily.      . cholecalciferol (VITAMIN D) 1000 UNITS tablet Take 1,000 Units by mouth daily.      . Glucosamine-Chondroitin-Vit D3 1500-1200-800 MG-MG-UNIT PACK Take 1,200 capsules by mouth daily. Joint pain      . hydrochlorothiazide (MICROZIDE) 12.5 MG capsule Take 12.5 mg by mouth daily.      . Multiple Vitamin (MULTIVITAMIN) tablet Take 1 tablet by mouth daily.      . nitroGLYCERIN (NITROSTAT) 0.4 MG SL tablet Place 0.4 mg under the tongue   every 5 (five) minutes as needed for chest pain.       No current facility-administered medications for this visit.    Allergies:    Allergies  Allergen Reactions  . Oxycodone     Extreme nausea and vomiting    Social History:  The patient  reports that she has never smoked. She has never used smokeless tobacco. She reports that she drinks about 0.6 ounces of alcohol per week. She reports that she does not use illicit drugs.   ROS:  Please see the history of present illness.   Denies blood in urine, stool, wheezing, orthopnea, PND, and edema.   All other systems reviewed and negative.   OBJECTIVE: VS:  BP 138/78  Pulse 67  Ht 5' 7" (1.702 m)  Wt 132 lb (59.875 kg)  BMI 20.67  kg/m2 Well nourished, well developed, in no acute distress, appears healthy and in no distress HEENT: normal Neck: JVD flat. Carotid bruit no bruits  Cardiac:  normal S1, S2; RRR; no murmur Lungs:  clear to auscultation bilaterally, no wheezing, rhonchi or rales Abd: soft, nontender, no hepatomegaly Ext: Edema no edema. Pulses 2+ Skin: warm and dry Neuro:  CNs 2-12 intact, no focal abnormalities noted  EKG:  Normal sinus rhythm with a rightward axis but no acute ST-T wave change. No significant change when compared to prior.       Signed, Rashi Granier W. B. Christophere Hillhouse III, MD 05/27/2013 4:36 PM   

## 2013-05-27 NOTE — Patient Instructions (Addendum)
Start Isosorbide 60mg  daily. An Rx has been sent to your pharmacy  Your physician recommends that you return for lab work on 05/28/13 (bmet, cbc)  Your physician has requested that you have a cardiac catheterization. Cardiac catheterization is used to diagnose and/or treat various heart conditions. Doctors may recommend this procedure for a number of different reasons. The most common reason is to evaluate chest pain. Chest pain can be a symptom of coronary artery disease (CAD), and cardiac catheterization can show whether plaque is narrowing or blocking your heart's arteries. This procedure is also used to evaluate the valves, as well as measure the blood flow and oxygen levels in different parts of your heart. For further information please visit [email or web address] Please follow instruction sheet, as given.

## 2013-05-28 ENCOUNTER — Encounter (HOSPITAL_COMMUNITY): Payer: Self-pay | Admitting: Pharmacy Technician

## 2013-05-28 ENCOUNTER — Other Ambulatory Visit (INDEPENDENT_AMBULATORY_CARE_PROVIDER_SITE_OTHER): Payer: Medicare Other

## 2013-05-28 DIAGNOSIS — R079 Chest pain, unspecified: Secondary | ICD-10-CM | POA: Diagnosis not present

## 2013-05-28 LAB — CBC WITH DIFFERENTIAL/PLATELET
Basophils Absolute: 0.1 10*3/uL (ref 0.0–0.1)
Basophils Relative: 0.6 % (ref 0.0–3.0)
Eosinophils Absolute: 0.1 10*3/uL (ref 0.0–0.7)
Eosinophils Relative: 1.1 % (ref 0.0–5.0)
HCT: 35.7 % — ABNORMAL LOW (ref 36.0–46.0)
Hemoglobin: 12.1 g/dL (ref 12.0–15.0)
Lymphocytes Relative: 18.5 % (ref 12.0–46.0)
Lymphs Abs: 1.6 10*3/uL (ref 0.7–4.0)
MCHC: 34 g/dL (ref 30.0–36.0)
MCV: 93 fl (ref 78.0–100.0)
Monocytes Absolute: 0.5 10*3/uL (ref 0.1–1.0)
Monocytes Relative: 6.4 % (ref 3.0–12.0)
Neutro Abs: 6.2 10*3/uL (ref 1.4–7.7)
Neutrophils Relative %: 73.4 % (ref 43.0–77.0)
Platelets: 212 10*3/uL (ref 150.0–400.0)
RBC: 3.84 Mil/uL — ABNORMAL LOW (ref 3.87–5.11)
RDW: 12.6 % (ref 11.5–14.6)
WBC: 8.4 10*3/uL (ref 4.5–10.5)

## 2013-05-28 LAB — BASIC METABOLIC PANEL
BUN: 18 mg/dL (ref 6–23)
CO2: 29 mEq/L (ref 19–32)
Calcium: 9.2 mg/dL (ref 8.4–10.5)
Chloride: 101 mEq/L (ref 96–112)
Creatinine, Ser: 0.7 mg/dL (ref 0.4–1.2)
GFR: 83.71 mL/min (ref >60.00)
Glucose, Bld: 106 mg/dL — ABNORMAL HIGH (ref 70–99)
Potassium: 3.7 mEq/L (ref 3.5–5.1)
Sodium: 137 mEq/L (ref 135–145)

## 2013-05-29 ENCOUNTER — Other Ambulatory Visit: Payer: Self-pay | Admitting: Interventional Cardiology

## 2013-05-29 ENCOUNTER — Encounter: Payer: Self-pay | Admitting: Interventional Cardiology

## 2013-05-29 ENCOUNTER — Telehealth: Payer: Self-pay

## 2013-05-29 DIAGNOSIS — I209 Angina pectoris, unspecified: Secondary | ICD-10-CM

## 2013-05-29 DIAGNOSIS — E785 Hyperlipidemia, unspecified: Secondary | ICD-10-CM | POA: Insufficient documentation

## 2013-05-29 NOTE — Telephone Encounter (Signed)
Message copied by Jarvis Newcomer on Fri May 29, 2013  3:37 PM ------      Message from: Verdis Prime      Created: Fri May 29, 2013  2:09 PM       Labs are stable for upcoming cath ------

## 2013-05-29 NOTE — Telephone Encounter (Signed)
pt aware of labs.  Labs are stable for upcoming cath. pt verbalized understanding.

## 2013-06-01 ENCOUNTER — Encounter (HOSPITAL_COMMUNITY)
Admission: RE | Disposition: A | Discharge status: Home / Self Care | Payer: Medicare Other | Source: Ambulatory Visit | Attending: Interventional Cardiology

## 2013-06-01 ENCOUNTER — Encounter (HOSPITAL_COMMUNITY): Payer: Self-pay | Admitting: General Practice

## 2013-06-01 ENCOUNTER — Ambulatory Visit (HOSPITAL_COMMUNITY)
Admission: RE | Admit: 2013-06-01 | Discharge: 2013-06-02 | Disposition: A | Discharge status: Home / Self Care | Payer: Medicare Other | Source: Ambulatory Visit | Attending: Interventional Cardiology | Admitting: Interventional Cardiology

## 2013-06-01 DIAGNOSIS — I25810 Atherosclerosis of coronary artery bypass graft(s) without angina pectoris: Secondary | ICD-10-CM | POA: Diagnosis present

## 2013-06-01 DIAGNOSIS — I209 Angina pectoris, unspecified: Secondary | ICD-10-CM | POA: Diagnosis present

## 2013-06-01 DIAGNOSIS — E785 Hyperlipidemia, unspecified: Secondary | ICD-10-CM | POA: Insufficient documentation

## 2013-06-01 DIAGNOSIS — I10 Essential (primary) hypertension: Secondary | ICD-10-CM | POA: Diagnosis present

## 2013-06-01 DIAGNOSIS — I2510 Atherosclerotic heart disease of native coronary artery without angina pectoris: Secondary | ICD-10-CM | POA: Diagnosis not present

## 2013-06-01 DIAGNOSIS — E876 Hypokalemia: Secondary | ICD-10-CM | POA: Diagnosis not present

## 2013-06-01 DIAGNOSIS — Z7982 Long term (current) use of aspirin: Secondary | ICD-10-CM | POA: Diagnosis not present

## 2013-06-01 DIAGNOSIS — I2582 Chronic total occlusion of coronary artery: Secondary | ICD-10-CM | POA: Diagnosis not present

## 2013-06-01 DIAGNOSIS — Z951 Presence of aortocoronary bypass graft: Secondary | ICD-10-CM

## 2013-06-01 HISTORY — PX: PERCUTANEOUS CORONARY STENT INTERVENTION (PCI-S): SHX5485

## 2013-06-01 HISTORY — PX: LEFT HEART CATHETERIZATION WITH CORONARY/GRAFT ANGIOGRAM: SHX5450

## 2013-06-01 LAB — POCT ACTIVATED CLOTTING TIME: Activated Clotting Time: 390 seconds

## 2013-06-01 LAB — PROTIME-INR
INR: 1.02 (ref 0.00–1.49)
Prothrombin Time: 13.2 seconds (ref 11.6–15.2)

## 2013-06-01 LAB — MRSA PCR SCREENING: MRSA by PCR: NEGATIVE

## 2013-06-01 SURGERY — LEFT HEART CATHETERIZATION WITH CORONARY/GRAFT ANGIOGRAM
Anesthesia: LOCAL

## 2013-06-01 MED ORDER — HEPARIN (PORCINE) IN NACL 2-0.9 UNIT/ML-% IJ SOLN
INTRAMUSCULAR | Status: AC
  Filled 2013-06-01: qty 1500

## 2013-06-01 MED ORDER — ONDANSETRON HCL 4 MG/2ML IJ SOLN
4.0000 mg | Freq: Four times a day (QID) | INTRAMUSCULAR | Status: DC | PRN
  Administered 2013-06-01: 15:00:00 2 mg via INTRAVENOUS
  Filled 2013-06-01: qty 2

## 2013-06-01 MED ORDER — ADULT MULTIVITAMIN W/MINERALS CH
1.0000 | ORAL_TABLET | Freq: Every day | ORAL | Status: DC
  Administered 2013-06-01 – 2013-06-02 (×2): 1 via ORAL
  Filled 2013-06-01 (×2): qty 1

## 2013-06-01 MED ORDER — HYDRALAZINE HCL 20 MG/ML IJ SOLN
10.0000 mg | Freq: Once | INTRAMUSCULAR | Status: AC
  Administered 2013-06-01: 10 mg via INTRAVENOUS
  Filled 2013-06-01: qty 1

## 2013-06-01 MED ORDER — TICAGRELOR 90 MG PO TABS
ORAL_TABLET | ORAL | Status: AC
  Filled 2013-06-01: qty 1

## 2013-06-01 MED ORDER — VITAMIN D3 25 MCG (1000 UNIT) PO TABS
1000.0000 [IU] | ORAL_TABLET | Freq: Every day | ORAL | Status: DC
  Administered 2013-06-01 – 2013-06-02 (×2): 1000 [IU] via ORAL
  Filled 2013-06-01 (×2): qty 1

## 2013-06-01 MED ORDER — BIVALIRUDIN 250 MG IV SOLR
INTRAVENOUS | Status: AC
  Filled 2013-06-01: qty 250

## 2013-06-01 MED ORDER — ATORVASTATIN CALCIUM 10 MG PO TABS
10.0000 mg | ORAL_TABLET | Freq: Every day | ORAL | Status: DC
  Administered 2013-06-02: 10:00:00 10 mg via ORAL
  Filled 2013-06-01 (×2): qty 1

## 2013-06-01 MED ORDER — SODIUM CHLORIDE 0.9 % IV SOLN
INTRAVENOUS | Status: AC
  Administered 2013-06-01: 13:00:00 via INTRAVENOUS

## 2013-06-01 MED ORDER — ASPIRIN EC 81 MG PO TBEC
81.0000 mg | DELAYED_RELEASE_TABLET | Freq: Every day | ORAL | Status: DC
  Administered 2013-06-01 – 2013-06-02 (×2): 81 mg via ORAL
  Filled 2013-06-01 (×2): qty 1

## 2013-06-01 MED ORDER — NITROGLYCERIN 0.4 MG SL SUBL: 0.4000 mg | SUBLINGUAL_TABLET | SUBLINGUAL | Status: DC | PRN

## 2013-06-01 MED ORDER — ACTIVE PARTNERSHIP FOR HEALTH OF YOUR HEART BOOK
Freq: Once | Status: AC
  Administered 2013-06-02
  Filled 2013-06-01: qty 1

## 2013-06-01 MED ORDER — SODIUM CHLORIDE 0.9 % IJ SOLN: 3.0000 mL | Freq: Two times a day (BID) | INTRAMUSCULAR | Status: DC

## 2013-06-01 MED ORDER — SODIUM CHLORIDE 0.9 % IV SOLN
INTRAVENOUS | Status: DC
  Administered 2013-06-01: 150 mL/h via INTRAVENOUS

## 2013-06-01 MED ORDER — FENTANYL CITRATE 0.05 MG/ML IJ SOLN
INTRAMUSCULAR | Status: AC
  Filled 2013-06-01: qty 2

## 2013-06-01 MED ORDER — SODIUM CHLORIDE 0.9 % IV SOLN: 250.0000 mL | INTRAVENOUS | Status: DC | PRN

## 2013-06-01 MED ORDER — GLUCOSAMINE-CHONDROITIN-VIT D3 1500-1200-800 MG-MG-UNIT PO PACK: 1200.0000 | PACK | Freq: Every day | ORAL | Status: DC

## 2013-06-01 MED ORDER — SODIUM CHLORIDE 0.9 % IJ SOLN: 3.0000 mL | INTRAMUSCULAR | Status: DC | PRN

## 2013-06-01 MED ORDER — ISOSORBIDE MONONITRATE ER 60 MG PO TB24
60.0000 mg | ORAL_TABLET | Freq: Every day | ORAL | Status: DC
  Administered 2013-06-01 – 2013-06-02 (×2): 60 mg via ORAL
  Filled 2013-06-01 (×3): qty 1

## 2013-06-01 MED ORDER — ACETAMINOPHEN 325 MG PO TABS
650.0000 mg | ORAL_TABLET | ORAL | Status: DC | PRN
  Administered 2013-06-01 – 2013-06-02 (×3): 650 mg via ORAL
  Filled 2013-06-01 (×3): qty 2

## 2013-06-01 MED ORDER — BENAZEPRIL HCL 40 MG PO TABS
40.0000 mg | ORAL_TABLET | Freq: Every day | ORAL | Status: DC
  Administered 2013-06-02: 40 mg via ORAL
  Filled 2013-06-01 (×2): qty 1

## 2013-06-01 MED ORDER — MIDAZOLAM HCL 2 MG/2ML IJ SOLN
INTRAMUSCULAR | Status: AC
  Filled 2013-06-01: qty 2

## 2013-06-01 MED ORDER — HYDRALAZINE HCL 20 MG/ML IJ SOLN
INTRAMUSCULAR | Status: AC
  Filled 2013-06-01: qty 1

## 2013-06-01 MED ORDER — ATENOLOL 100 MG PO TABS
100.0000 mg | ORAL_TABLET | Freq: Every day | ORAL | Status: DC
  Administered 2013-06-02: 100 mg via ORAL
  Filled 2013-06-01 (×2): qty 1

## 2013-06-01 MED ORDER — TICAGRELOR 90 MG PO TABS
90.0000 mg | ORAL_TABLET | Freq: Two times a day (BID) | ORAL | Status: DC
  Administered 2013-06-01 – 2013-06-02 (×2): 90 mg via ORAL
  Filled 2013-06-01 (×4): qty 1

## 2013-06-01 MED ORDER — ONE-DAILY MULTI VITAMINS PO TABS: 1.0000 | ORAL_TABLET | Freq: Every day | ORAL | Status: DC

## 2013-06-01 MED ORDER — OCUVITE PO TABS
1.0000 | ORAL_TABLET | Freq: Every day | ORAL | Status: DC
  Administered 2013-06-01 – 2013-06-02 (×2): 1 via ORAL
  Filled 2013-06-01 (×2): qty 1

## 2013-06-01 MED ORDER — LIDOCAINE HCL (PF) 1 % IJ SOLN
INTRAMUSCULAR | Status: AC
  Filled 2013-06-01: qty 30

## 2013-06-01 MED ORDER — VERAPAMIL HCL 2.5 MG/ML IV SOLN
INTRAVENOUS | Status: AC
Start: 2013-06-01 — End: 2013-06-01
  Filled 2013-06-01: qty 2

## 2013-06-01 MED ORDER — ASPIRIN 81 MG PO CHEW: 81.0000 mg | CHEWABLE_TABLET | ORAL | Status: DC

## 2013-06-01 MED ORDER — HYDROCHLOROTHIAZIDE 12.5 MG PO CAPS
12.5000 mg | ORAL_CAPSULE | Freq: Every day | ORAL | Status: DC
  Administered 2013-06-01 – 2013-06-02 (×2): 12.5 mg via ORAL
  Filled 2013-06-01 (×2): qty 1

## 2013-06-01 MED ORDER — NITROGLYCERIN 0.2 MG/ML ON CALL CATH LAB
INTRAVENOUS | Status: AC
  Filled 2013-06-01: qty 1

## 2013-06-01 NOTE — H&P (View-Only) (Signed)
Patient ID: Claudia Howell, female   DOB: 01/08/1941, 72 y.o.   MRN: [id number] Past Medical History  Hypertension (Dr. Verdis Prime)   Hyperlipidemia   G3, P2, SAB x1, NSVD x2   OSTEOPENIA(02/2008)   Adenomatous colon polyp (03/2008)   vitamin D deficiency (06/2009)--mild   CABG with Maze, April 2014   Paroxysmal atrial fibrillation      [address] [place], [place]  [address] Phone: [phone] Fax:  [phone]  Date:  05/27/2013   ID:  Claudia Howell, DOB [birth year], MRN [id number]  PCP:  Lavonda Jumbo, MD   ASSESSMENT:  1. Recurrent angina post coronary bypass graft 2. CABG, April 2014 3. History of paroxysmal atrial fibrillation, without recurrence since Maze procedure at time of coronary bypass grafting 4. Hypertension, controlled 5. Hyperlipidemia  PLAN:  1. Imdur 60 mg daily 2. Coronary angiography within the next 5 days. 3. Decreased activities has stable lower anginal threshold. 4. Report to the emergency room if prolonged chest discomfort not relieved by rest or sublingual nitroglycerin   SUBJECTIVE: Claudia Howell is a 72 y.o. female who has a three-week history of severe arm and upper chest discomfort which is very similar to her pre-bypass symptoms. The initial episode occurred at rest. Since the episode occurring at rest 3 weeks ago, she has angina with minimal physical activity. She is use nitroglycerin with relief. Rest relieves the discomfort. She has continued to attempt to exercise.   Wt Readings from Last 3 Encounters:  05/27/13 132 lb (59.875 kg)  05/04/13 132 lb (59.875 kg)  02/02/13 141 lb (63.957 kg)     Past Medical History  Diagnosis Date  . Hypertension     Dr.Emiley Digiacomo Katrinka Blazing  . Hyperlipidemia   . Osteopenia 9/09  . Adenomatous colon polyp 10/09  . Chest pain on exertion     "escalating over last few months" (10/16/2012)  . Coronary artery disease   . PAF (paroxysmal atrial fibrillation)     during cath/notes 10/16/2012  .  Decubitus ulcer of coccyx 10/22/12    1/2 inch raw open area on coccyx  . S/P CABG x 3 10/22/2012    LIMA to LAD, SVG to distal RCA, SVG to OM, EVH from right thigh  . S/P Maze operation for atrial fibrillation 10/22/2012    Left side lesion set using bipolar radiofrequency and cryothermy ablation with clipping of LA appendage    Current Outpatient Prescriptions  Medication Sig Dispense Refill  . aspirin EC 81 MG tablet Take 1 tablet (81 mg total) by mouth daily.      Marland Kitchen atenolol (TENORMIN) 100 MG tablet Take 100 mg by mouth daily.      Marland Kitchen atorvastatin (LIPITOR) 10 MG tablet TAKE 1 TABLET (10 MG TOTAL) BY MOUTH DAILY.  90 tablet  0  . benazepril (LOTENSIN) 40 MG tablet Take 40 mg by mouth daily.      . beta carotene w/minerals (OCUVITE) tablet Take 1 tablet by mouth daily.      . cholecalciferol (VITAMIN D) 1000 UNITS tablet Take 1,000 Units by mouth daily.      . Glucosamine-Chondroitin-Vit D3 1500-1200-800 MG-MG-UNIT PACK Take 1,200 capsules by mouth daily. Joint pain      . hydrochlorothiazide (MICROZIDE) 12.5 MG capsule Take 12.5 mg by mouth daily.      . Multiple Vitamin (MULTIVITAMIN) tablet Take 1 tablet by mouth daily.      . nitroGLYCERIN (NITROSTAT) 0.4 MG SL tablet Place 0.4 mg under the tongue  every 5 (five) minutes as needed for chest pain.       No current facility-administered medications for this visit.    Allergies:    Allergies  Allergen Reactions  . Oxycodone     Extreme nausea and vomiting    Social History:  The patient  reports that she has never smoked. She has never used smokeless tobacco. She reports that she drinks about 0.6 ounces of alcohol per week. She reports that she does not use illicit drugs.   ROS:  Please see the history of present illness.   Denies blood in urine, stool, wheezing, orthopnea, PND, and edema.   All other systems reviewed and negative.   OBJECTIVE: VS:  BP 138/78  Pulse 67  Ht 5\' 7"  (1.702 m)  Wt 132 lb (59.875 kg)  BMI 20.67  kg/m2 Well nourished, well developed, in no acute distress, appears healthy and in no distress HEENT: normal Neck: JVD flat. Carotid bruit no bruits  Cardiac:  normal S1, S2; RRR; no murmur Lungs:  clear to auscultation bilaterally, no wheezing, rhonchi or rales Abd: soft, nontender, no hepatomegaly Ext: Edema no edema. Pulses 2+ Skin: warm and dry Neuro:  CNs 2-12 intact, no focal abnormalities noted  EKG:  Normal sinus rhythm with a rightward axis but no acute ST-T wave change. No significant change when compared to prior.       Signed, Darci Needle III, MD 05/27/2013 4:36 PM

## 2013-06-01 NOTE — Progress Notes (Signed)
Site area: right groin  Site Prior to Removal:  Level 0  Pressure Applied For 20 MINUTES    Minutes Beginning at 1400  Manual:   yes  Patient Status During Pull:  stable  Post Pull Groin Site:  Level 0  Post Pull Instructions Given:  yes  Post Pull Pulses Present:  yes  Dressing Applied:  yes  Comments:

## 2013-06-01 NOTE — Interval H&P Note (Signed)
Cath Lab Visit (complete for each Cath Lab visit)  Clinical Evaluation Leading to the Procedure:   ACS: no  Non-ACS:    Anginal Classification: CCS III  Anti-ischemic medical therapy: Maximal Therapy (2 or more classes of medications)  Non-Invasive Test Results: No non-invasive testing performed  Prior CABG: Previous CABG      History and Physical Interval Note:  06/01/2013 9:48 AM  Claudia Howell  has presented today for surgery, with the diagnosis of Chest pain  The various methods of treatment have been discussed with the patient and family. After consideration of risks, benefits and other options for treatment, the patient has consented to  Procedure(s): LEFT HEART CATHETERIZATION WITH CORONARY/GRAFT ANGIOGRAM (N/A) as a surgical intervention .  The patient's history has been reviewed, patient examined, no change in status, stable for surgery.  I have reviewed the patient's chart and labs.  Questions were answered to the patient's satisfaction.     Claudia Howell

## 2013-06-01 NOTE — CV Procedure (Signed)
Left Heart Catheterization with Coronary Angiography with Bypass Angiography and PCI Report  Claudia Howell  72 y.o.  female 1940-07-23  Procedure Date: 06/01/2013  Referring Physician: Joselyn Arrow, M.D. Primary Cardiologist:: H. WB Leia Alf, M.D.  INDICATIONS: Class III angina developing over the past 3 weeks and continuing despite 2 drug anti-ischemic therapy. Coronary bypass surgery was performed less than 12 months ago. Current symptoms are very similar to those that occurred prior to CABG.  PROCEDURE: 1. Left heart catheterization; 2. Selective coronary angiography; 3. Bypass graft angiography; 4. Left internal mammograph to angiography; 5. Left ventriculography; 6. Drug-eluting stent saphenous vein graft to the obtuse marginal  CONSENT:  The risks, benefits, and details of the procedure were explained in detail to the patient. Risks including death, stroke, heart attack, kidney injury, allergy, limb ischemia, bleeding and radiation injury were discussed.  The patient verbalized understanding and wanted to proceed.  Informed written consent was obtained.  PROCEDURE TECHNIQUE:  After Xylocaine anesthesia a 5 French sheath was placed in the right femoral artery with a single anterior needle wall stick. We made several passes at the left radial artery. We obtained it blood flow but were never able to advance the guidewire. She then began having radial artery spasm and we converted to the right femoral approach. Coronary angiography was done using a 5 [place] A2 MP, a 5 [place] JR 4, and 5 French left IMA catheter.  Left ventriculography was done using the 5 French A2 MP catheter and hand injection.   Unfortunately for the patient, there is bypass graft occlusion with 100% obstruction of the graft to the right, a relatively focal 95% stenosis in the distal segment of the graft to the marginal. The LIMA to the LAD is widely patent and there is also brisk antegrade flow down the native  circulation.  After consideration we decided to perform PCI on the SVG to the OM as this seems the likely culprit for the patient's symptoms. The native distal right coronary is small in caliber and distribution explaining the likely reason for graft occlusion.  We used a 6 French left Amplatz 0.75 cm guide catheter. Angiomax and Brilinta were used as anticoagulation and antiplatelet agents.  The patient received multiple doses of Versed and fentanyl.  The PCI on the saphenous vein graft appeared to be quite simple to performed, but ended up presenting challenges related to a poor choice of gag catheter size. We should use an Amplatz left 1 cm. We therefore had difficulty with backup support during the intervention. We were eventually able to succeed at stenting the stenosis and also using an overlapping stent to treat what I felt was a dissection but later feel was likely a saphenous vein valve protruding at the end of the initial stent.  A Prowater wire was used to cross the stenosis through the 0.075 Amplatz 6 [place] guide catheter. Predilatation with a 2.5 x 12 mm long balloon was performed. We then positioned and deployed a 3.0 x 16 mm Promus Premier to 10 atmospheres. After the stent was deployed the appearance of a distal dissection was noted. This is a region referred to a above that likely represents a venous valve. During the case I treated this as a dissection and overlapped and 8 mm x 2.75 mm stent. There was still evidence of this outpouching despite an overlapping stent. Being careful not to over expand within the saphenous vein graft that is relatively new, we completed the case without  post medial pressures all being less than 12 atmospheres. TIMI grade 3 flow was noted. Balloon occlusion reproduced the patient's culprit symptoms appear    MEDICATIONS: Versed, fentanyl, Angiomax, and Brilinta.  CONTRAST:  Total of 240 cc.  COMPLICATIONS:  None   HEMODYNAMICS:  Aortic pressure  138/61 mmHg; LV pressure 138/17 mmH3; LVEDP 17 mm mercury  ANGIOGRAPHIC DATA:   The left main coronary artery is widely patent.  The left anterior descending artery is patent with competitive flow into the LAD in the mid segment from the LIMA. Diffuse disease is noted throughout the entire mid LAD segment. After the first diagonal there is 90% obstruction..  The left circumflex artery is totally occluded.  The right coronary artery is ostial 50% stenosis with heavy calcification proximal 50% followed by 90% stenosis mid diffuse disease up to 80%. 95% stenosis before the PDA and left ventricular branches. The distal branches are small.  BYPASS GRAFT ANGIOGRAPHY: SVG to RCA: Totally occluded  SVG to obtuse marginal: 95% distal stenosis  LIMA to LAD, widely patent with perhaps 50% distal anastomosis stenosis  PCI RESULTS: SVG to obtuse marginal with distal 90% stenosis. The distal vessel native tortuosity presented difficulty with deep guidewire position. A 16 x 3.0 Promus Premier was deployed and subsequently overlapped with a 2.75 x 8 mm [employer] yielding an adequate result. Final postdilatation pressure 12 atmospheres at 2.75 millimeters. The final angiographic result demonstrated either a saphenous vein valve filling through the stent struts (or a focal dissection in a relatively new  Saphenous vein bypass graft).  LEFT VENTRICULOGRAM:  Left ventricular angiogram was done in the 30 RAO projection and revealed 55% with global contractility being normal.   IMPRESSIONS:  1. 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 Successful overlapping stents and then mid to distal saphenous vein graft to the obtuse marginal. There is a persistent outpouching in the region of the distal margin of the initial stent that likely represents a saphenous vein valve.  3. Patent LIMA to LAD  4.  Severe native vessel right coronary disease with high-grade proximal mid and distal obstruction with relatively small distribution.  5. Patent native left anterior descending with a diathesis proximal to mid disease. Total occlusion of the native circumflex.  6. Left cardiac silhouette metal structure running adjacent to the saphenous vein graft to the obtuse marginal. Exact nature of this structure is unknown but will query Dr.Owen.   RECOMMENDATION:  Aspirin and Plavix. If continued class III angina will have to perform PCI on the native right coronary. Expect discharge in a.m.Marland Kitchen

## 2013-06-01 NOTE — Progress Notes (Signed)
EKG DONE

## 2013-06-02 DIAGNOSIS — I209 Angina pectoris, unspecified: Secondary | ICD-10-CM

## 2013-06-02 DIAGNOSIS — I2510 Atherosclerotic heart disease of native coronary artery without angina pectoris: Secondary | ICD-10-CM | POA: Diagnosis not present

## 2013-06-02 DIAGNOSIS — I25810 Atherosclerosis of coronary artery bypass graft(s) without angina pectoris: Secondary | ICD-10-CM | POA: Diagnosis present

## 2013-06-02 DIAGNOSIS — I2582 Chronic total occlusion of coronary artery: Secondary | ICD-10-CM | POA: Diagnosis not present

## 2013-06-02 LAB — CBC
HCT: 33 % — ABNORMAL LOW (ref 36.0–46.0)
Hemoglobin: 11 g/dL — ABNORMAL LOW (ref 12.0–15.0)
MCH: 31.6 pg (ref 26.0–34.0)
MCHC: 33.3 g/dL (ref 30.0–36.0)
MCV: 94.8 fL (ref 78.0–100.0)
Platelets: 163 10*3/uL (ref 150–400)
RBC: 3.48 MIL/uL — ABNORMAL LOW (ref 3.87–5.11)
RDW: 12.6 % (ref 11.5–15.5)
WBC: 8.1 10*3/uL (ref 4.0–10.5)

## 2013-06-02 LAB — BASIC METABOLIC PANEL
BUN: 12 mg/dL (ref 6–23)
CO2: 27 mEq/L (ref 19–32)
Calcium: 8.6 mg/dL (ref 8.4–10.5)
Chloride: 100 mEq/L (ref 96–112)
Creatinine, Ser: 0.68 mg/dL (ref 0.50–1.10)
GFR calc Af Amer: >90 mL/min (ref >90)
GFR calc non Af Amer: 87 mL/min — ABNORMAL LOW (ref >90)
Glucose, Bld: 113 mg/dL — ABNORMAL HIGH (ref 70–99)
Potassium: 3 mEq/L — ABNORMAL LOW (ref 3.5–5.1)
Sodium: 137 mEq/L (ref 135–145)

## 2013-06-02 MED ORDER — TICAGRELOR 90 MG PO TABS: 90.0000 mg | ORAL_TABLET | Freq: Two times a day (BID) | ORAL | Status: DC

## 2013-06-02 MED ORDER — POTASSIUM CHLORIDE CRYS ER 20 MEQ PO TBCR
40.0000 meq | EXTENDED_RELEASE_TABLET | ORAL | Status: AC
  Administered 2013-06-02 (×2): 40 meq via ORAL
  Filled 2013-06-02: qty 2

## 2013-06-02 MED FILL — Sodium Chloride IV Soln 0.9%: INTRAVENOUS | Qty: 50 | Status: AC

## 2013-06-02 NOTE — Progress Notes (Signed)
CARDIAC REHAB PHASE I   PRE:  Rate/Rhythm: 76SR  BP:  Supine:   Sitting: 123/53  Standing:    SaO2:   MODE:  Ambulation: 1000 ft   POST:  Rate/Rhythm: 90SR  BP:  Supine:   Sitting: 153/75  Standing:    SaO2:  0855-0935 Pt walked 1000 ft on RA with steady gait. Tolerated well. No CP. Education brief review as pt just completed CRP 2 and attended diet classes. Pt has brilinta packet and understands importance. Exercise ed given and pt wants to ex on her own as she has been doing. Does not want to repeat CRP 2 as she just completed.  Reviewed NTG use.   Luetta Nutting, RN BSN  06/02/2013 9:33 AM

## 2013-06-02 NOTE — Discharge Summary (Signed)
CARDIOLOGY DISCHARGE SUMMARY   Patient ID: Claudia Howell MRN: [id number] DOB/AGE: 12/16/1940 71 y.o.  Admit date: 06/01/2013 Discharge date: 06/02/2013  PCP: Lavonda Jumbo, MD Primary Cardiologist:   Primary Discharge Diagnosis:    Angina, class III  3.0 x 16 mm Promus Premier overlapped with a 2.75 mm x 8 mm stent in the SVG-OM  Secondary Discharge Diagnosis:    CAD (coronary artery disease), autologous vein bypass graft   Essential hypertension, benign   Other and unspecified angina pectoris    Hypokalemia  Procedures:1. Left heart catheterization; 2. Selective coronary angiography; 3. Bypass graft angiography; 4. Left internal mammograph to angiography; 5. Left ventriculography; 6. Drug-eluting stent saphenous vein graft to the obtuse marginal  Hospital Course: Claudia Howell is a 72 y.o. female with a history of CABG in April 2014. She was seen in the office by Dr. Katrinka Blazing on 05/27/2013. She was having class III angina symptoms similar to her pre-bypass symptoms, relieved by nitroglycerin. Cardiac catheterization was recommended and she came to the hospital for the procedure on 12/8.  Cardiac catheterization results are below. She had significant progression of coronary artery disease since her bypass surgery. The SVG-RCA was occluded. There was a 95% stenosis in the SVG-OM. This was treated with 2 overlapping stents. See the report below for more details. She tolerated the procedure well.  On 06/02/2013, she was seen by Dr. Katrinka Blazing. She was having no chest pain with ambulation. Her cath site was without hematoma. Dr. Katrinka Blazing reviewed all data and felt no further inpatient workup was indicated. She is to continue current medical therapy and is considered stable for discharge, in improved condition, to follow up as an outpatient.  Labs:   Lab Results  Component Value Date   WBC 8.1 06/02/2013   HGB 11.0* 06/02/2013   HCT 33.0* 06/02/2013   MCV 94.8 06/02/2013   PLT 163 06/02/2013      Recent Labs Lab 06/02/13 0525  NA 137  K 3.0*  CL 100  CO2 27  BUN 12  CREATININE 0.68  CALCIUM 8.6  GLUCOSE 113*    Recent Labs  06/01/13 0833  INR 1.02    Cardiac Cath: 06/01/2013 The left anterior descending artery is patent with competitive flow into the LAD in the mid segment from the LIMA. Diffuse disease is noted throughout the entire mid LAD segment. After the first diagonal there is 90% obstruction..  The left circumflex artery is totally occluded.  The right coronary artery is ostial 50% stenosis with heavy calcification proximal 50% followed by 90% stenosis mid diffuse disease up to 80%. 95% stenosis before the PDA and left ventricular branches. The distal branches are small.  BYPASS GRAFT ANGIOGRAPHY: SVG to RCA: Totally occluded  SVG to obtuse marginal: 95% distal stenosis  LIMA to LAD, widely patent with perhaps 50% distal anastomosis stenosis  PCI RESULTS: SVG to obtuse marginal with distal 90% stenosis. The distal vessel native tortuosity presented difficulty with deep guidewire position. A 16 x 3.0 Promus Premier was deployed and subsequently overlapped with a 2.75 x 8 mm [employer] yielding an adequate result. Final postdilatation pressure 12 atmospheres at 2.75 millimeters. The final angiographic result demonstrated either a saphenous vein valve filling through the stent struts (or a focal dissection in a relatively new Saphenous vein bypass graft).  LEFT VENTRICULOGRAM: Left ventricular angiogram was done in the 30 RAO projection and revealed 55% with global contractility being normal.  IMPRESSIONS: 1. Saphenous vein graft failure  with occlusion of the graft to the right and high-grade obstruction of the saphenous vein graft to the obtuse marginal  2. Successful overlapping stents and then mid to distal saphenous vein graft to the obtuse marginal. There is a persistent outpouching in the region of the distal margin of the initial stent that likely represents  a saphenous vein valve.  3. Patent LIMA to LAD  4. Severe native vessel right coronary disease with high-grade proximal mid and distal obstruction with relatively small distribution.  5. Patent native left anterior descending with a diathesis proximal to mid disease. Total occlusion of the native circumflex.  6. Left cardiac silhouette metal structure running adjacent to the saphenous vein graft to the obtuse marginal. Exact nature of this structure is unknown but will query Dr.Owen. (Clip is LAA clip placed during CABG)  EKG: 06/02/2013 SR, no acute ischemic changes Vent. rate 73 BPM PR interval 178 ms QRS duration 90 ms QT/QTc 430/473 ms P-R-T axes 67 77 67   FOLLOW UP PLANS AND APPOINTMENTS Allergies  Allergen Reactions  . Oxycodone     Extreme nausea and vomiting     Medication List         aspirin EC 81 MG tablet  Take 1 tablet (81 mg total) by mouth daily.     atenolol 100 MG tablet  Commonly known as:  TENORMIN  Take 100 mg by mouth daily.     atorvastatin 10 MG tablet  Commonly known as:  LIPITOR  Take 10 mg by mouth daily.     benazepril 40 MG tablet  Commonly known as:  LOTENSIN  Take 40 mg by mouth daily.     beta carotene w/minerals tablet  Take 1 tablet by mouth daily.     cholecalciferol 1000 UNITS tablet  Commonly known as:  VITAMIN D  Take 1,000 Units by mouth daily.     Glucosamine-Chondroitin-Vit D3 1500-1200-800 MG-MG-UNIT Pack  Take 1,200 capsules by mouth daily. Joint pain     hydrochlorothiazide 12.5 MG capsule  Commonly known as:  MICROZIDE  Take 12.5 mg by mouth daily.     isosorbide mononitrate 60 MG 24 hr tablet  Commonly known as:  IMDUR  Take 1 tablet (60 mg total) by mouth daily.     multivitamin tablet  Take 1 tablet by mouth daily.     nitroGLYCERIN 0.4 MG SL tablet  Commonly known as:  NITROSTAT  Place 0.4 mg under the tongue every 5 (five) minutes as needed for chest pain.     Ticagrelor 90 MG Tabs tablet  Commonly  known as:  BRILINTA  Take 1 tablet (90 mg total) by mouth 2 (two) times daily.        Discharge Orders   Future Appointments Provider Department Dept Phone   06/12/2013 9:45 AM [name], MD [facility] [phone]   10/22/2013 9:30 AM [name], MD [facility] [phone]   11/02/2013 12:30 PM Purcell Nails, MD [facility] [phone]   Future Orders Complete By Expires   Diet - low sodium heart healthy  As directed    Increase activity slowly  As directed      Follow-up Information   Follow up with [name], MD On 06/12/2013. (at 9:45 am)    Specialty:  Cardiology   Contact information:   [address] [place] [address] [phone]       Layla Maw  ALL MEDICATIONS WITH YOU TO FOLLOW UP APPOINTMENTS  Time spent with patient to include physician time: 34 min Signed: Theodore Demark, PA-C 06/02/2013, 9:20 AM Co-Sign MD  Agree with above aseessment and summary

## 2013-06-02 NOTE — Progress Notes (Signed)
I have spoken to patient . The cath site is unremarkable. The object noted on flouro is a left atrial appendage clip placed at surgery. Ready for d/c and f/u with me only in 7-10 days.

## 2013-06-12 ENCOUNTER — Encounter: Payer: Self-pay | Admitting: Interventional Cardiology

## 2013-06-12 ENCOUNTER — Ambulatory Visit (INDEPENDENT_AMBULATORY_CARE_PROVIDER_SITE_OTHER): Payer: Medicare Other | Admitting: Interventional Cardiology

## 2013-06-12 VITALS — BP 136/62 | HR 70 | Ht 67.0 in | Wt 129.0 lb

## 2013-06-12 DIAGNOSIS — I209 Angina pectoris, unspecified: Secondary | ICD-10-CM | POA: Diagnosis not present

## 2013-06-12 DIAGNOSIS — Z951 Presence of aortocoronary bypass graft: Secondary | ICD-10-CM

## 2013-06-12 DIAGNOSIS — I4891 Unspecified atrial fibrillation: Secondary | ICD-10-CM | POA: Diagnosis not present

## 2013-06-12 DIAGNOSIS — E785 Hyperlipidemia, unspecified: Secondary | ICD-10-CM

## 2013-06-12 DIAGNOSIS — I10 Essential (primary) hypertension: Secondary | ICD-10-CM

## 2013-06-12 DIAGNOSIS — I480 Paroxysmal atrial fibrillation: Secondary | ICD-10-CM

## 2013-06-12 NOTE — Patient Instructions (Addendum)
Your physician has recommended you make the following change in your medication:   1. Stop Imdur  Your physician wants you to follow-up in: 6 months with Dr.Smith. You will receive a reminder letter in the mail two months in advance. If you don't receive a letter, please call our office to schedule the follow-up appointment.  *WE ENCOURAGE YOU TO BE ACTIVE*  *PLEASE REPORT ANY CHEST PAIN TO DR.SMITH*

## 2013-06-12 NOTE — Progress Notes (Signed)
Patient ID: Claudia Howell, female   DOB: [birth year], 72 y.o.   MRN: [id number]    [address] [place], [place]  [address] Phone: [phone] Fax:  [phone]  Date:  06/12/2013   ID:  Claudia Howell, DOB March 16, 1941, MRN [id number]  PCP:  Lavonda Jumbo, MD   ASSESSMENT:  1. Coronary artery disease with early bypass graft (saphenous vein) failure possibly related to a period of time off of aspirin after surgery. Recent stenting of the saphenous vein graft to the obtuse marginal has totally relieve her exertional symptoms. 2. Hyperlipidemia on therapy 3. Side effects on Imdur requiring that it be discontinued 4. No recurrence of atrial fibrillation  PLAN:  1. Doing well on dual antiplatelet therapy. We will continue this for at least a year and possibly longer. We will have the option of switching to Plavix perhaps when I see her back in the summer. 2. Discontinue Imdur 3. Clinical followup in 6 months   SUBJECTIVE: Claudia Howell is a 72 y.o. female who has resolution of angina after stenting of the saphenous vein graft to the obtuse marginal. The saphenous vein graft to the right coronary is also occluded and the native right coronary has severe diffuse disease. She denies angina. She's had no palpitations or syncope. After stopping Imdur, headaches dissipated. Overall she feels that she is back to where she was several months ago. She reveals to me that for a considerable period of time after bypass surgery, she stopped taking an aspirin until Dr. Cornelius Moras had her resume it.  Wt Readings from Last 3 Encounters:  06/12/13 129 lb (58.514 kg)  06/02/13 132 lb 11.5 oz (60.2 kg)  06/02/13 132 lb 11.5 oz (60.2 kg)     Past Medical History  Diagnosis Date  . Hypertension     Dr.Finlay Godbee Katrinka Blazing  . Hyperlipidemia   . Osteopenia 9/09  . Adenomatous colon polyp 10/09  . Chest pain on exertion     "escalating over last few months" (10/16/2012)  . Coronary artery disease   . PAF  (paroxysmal atrial fibrillation)     during cath/notes 10/16/2012  . Decubitus ulcer of coccyx 10/22/12    1/2 inch raw open area on coccyx  . S/P CABG x 3 10/22/2012    LIMA to LAD, SVG to distal RCA, SVG to OM, EVH from right thigh  . S/P Maze operation for atrial fibrillation 10/22/2012    Left side lesion set using bipolar radiofrequency and cryothermy ablation with clipping of LA appendage    Current Outpatient Prescriptions  Medication Sig Dispense Refill  . aspirin EC 81 MG tablet Take 1 tablet (81 mg total) by mouth daily.      Marland Kitchen atenolol (TENORMIN) 100 MG tablet Take 100 mg by mouth daily.      Marland Kitchen atorvastatin (LIPITOR) 10 MG tablet Take 10 mg by mouth daily.      . benazepril (LOTENSIN) 40 MG tablet Take 40 mg by mouth daily.      . beta carotene w/minerals (OCUVITE) tablet Take 1 tablet by mouth daily.      . cholecalciferol (VITAMIN D) 1000 UNITS tablet Take 1,000 Units by mouth daily.      . Glucosamine-Chondroitin-Vit D3 1500-1200-800 MG-MG-UNIT PACK Take 1,200 capsules by mouth daily. Joint pain      . hydrochlorothiazide (MICROZIDE) 12.5 MG capsule Take 12.5 mg by mouth daily.      . Multiple Vitamin (MULTIVITAMIN) tablet Take 1 tablet by  mouth daily.      . nitroGLYCERIN (NITROSTAT) 0.4 MG SL tablet Place 0.4 mg under the tongue every 5 (five) minutes as needed for chest pain.      . Ticagrelor (BRILINTA) 90 MG TABS tablet Take 1 tablet (90 mg total) by mouth 2 (two) times daily.  60 tablet  11   No current facility-administered medications for this visit.    Allergies:    Allergies  Allergen Reactions  . Oxycodone     Extreme nausea and vomiting    Social History:  The patient  reports that she has never smoked. She has never used smokeless tobacco. She reports that she drinks about 0.6 ounces of alcohol per week. She reports that she does not use illicit drugs.   ROS:  Please see the history of present illness.   Denies neurological complaints of palpitations   All  other systems reviewed and negative.   OBJECTIVE: VS:  BP 136/62  Pulse 70  Ht 5\' 7"  (1.702 m)  Wt 129 lb (58.514 kg)  BMI 20.20 kg/m2 Well nourished, well developed, in no acute distress, slender and healthy HEENT: normal Neck: JVD flat. Carotid bruit absent  Cardiac:  normal S1, S2; RRR; no murmur Lungs:  clear to auscultation bilaterally, no wheezing, rhonchi or rales Abd: soft, nontender, no hepatomegaly Ext: Edema absent. Pulses 2+ Skin: warm and dry Neuro:  CNs 2-12 intact, no focal abnormalities noted  EKG:  Not performed       Signed, Darci Needle III, MD 06/12/2013 10:12 AM

## 2013-07-27 ENCOUNTER — Other Ambulatory Visit: Payer: Self-pay | Admitting: Family Medicine

## 2013-08-10 ENCOUNTER — Ambulatory Visit (INDEPENDENT_AMBULATORY_CARE_PROVIDER_SITE_OTHER): Payer: Medicare Other | Admitting: Family Medicine

## 2013-08-10 ENCOUNTER — Encounter: Payer: Self-pay | Admitting: Family Medicine

## 2013-08-10 VITALS — BP 150/70 | HR 76 | Ht 65.0 in | Wt 128.0 lb

## 2013-08-10 DIAGNOSIS — I10 Essential (primary) hypertension: Secondary | ICD-10-CM

## 2013-08-10 DIAGNOSIS — Z79899 Other long term (current) drug therapy: Secondary | ICD-10-CM

## 2013-08-10 DIAGNOSIS — R0989 Other specified symptoms and signs involving the circulatory and respiratory systems: Secondary | ICD-10-CM

## 2013-08-10 DIAGNOSIS — Z9889 Other specified postprocedural states: Secondary | ICD-10-CM

## 2013-08-10 DIAGNOSIS — Z8679 Personal history of other diseases of the circulatory system: Secondary | ICD-10-CM

## 2013-08-10 DIAGNOSIS — R7301 Impaired fasting glucose: Secondary | ICD-10-CM | POA: Diagnosis not present

## 2013-08-10 DIAGNOSIS — E7800 Pure hypercholesterolemia, unspecified: Secondary | ICD-10-CM | POA: Diagnosis not present

## 2013-08-10 DIAGNOSIS — I25810 Atherosclerosis of coronary artery bypass graft(s) without angina pectoris: Secondary | ICD-10-CM | POA: Diagnosis not present

## 2013-08-10 LAB — CBC WITH DIFFERENTIAL/PLATELET
Basophils Absolute: 0.1 10*3/uL (ref 0.0–0.1)
Basophils Relative: 1 % (ref 0–1)
Eosinophils Absolute: 0.2 10*3/uL (ref 0.0–0.7)
Eosinophils Relative: 3 % (ref 0–5)
HCT: 39.3 % (ref 36.0–46.0)
Hemoglobin: 13.1 g/dL (ref 12.0–15.0)
Lymphocytes Relative: 23 % (ref 12–46)
Lymphs Abs: 1.5 10*3/uL (ref 0.7–4.0)
MCH: 31.5 pg (ref 26.0–34.0)
MCHC: 33.3 g/dL (ref 30.0–36.0)
MCV: 94.5 fL (ref 78.0–100.0)
Monocytes Absolute: 0.5 10*3/uL (ref 0.1–1.0)
Monocytes Relative: 8 % (ref 3–12)
Neutro Abs: 4.4 10*3/uL (ref 1.7–7.7)
Neutrophils Relative %: 65 % (ref 43–77)
Platelets: 189 10*3/uL (ref 150–400)
RBC: 4.16 MIL/uL (ref 3.87–5.11)
RDW: 12.8 % (ref 11.5–15.5)
WBC: 6.7 10*3/uL (ref 4.0–10.5)

## 2013-08-10 LAB — COMPREHENSIVE METABOLIC PANEL
ALT: 24 U/L (ref 0–35)
AST: 22 U/L (ref 0–37)
Albumin: 4.1 g/dL (ref 3.5–5.2)
Alkaline Phosphatase: 53 U/L (ref 39–117)
BUN: 12 mg/dL (ref 6–23)
CO2: 27 mEq/L (ref 19–32)
Calcium: 9 mg/dL (ref 8.4–10.5)
Chloride: 103 mEq/L (ref 96–112)
Creat: 0.68 mg/dL (ref 0.50–1.10)
Glucose, Bld: 95 mg/dL (ref 70–99)
Potassium: 3.8 mEq/L (ref 3.5–5.3)
Sodium: 137 mEq/L (ref 135–145)
Total Bilirubin: 0.5 mg/dL (ref 0.2–1.2)
Total Protein: 7 g/dL (ref 6.0–8.3)

## 2013-08-10 LAB — LIPID PANEL
Cholesterol: 168 mg/dL (ref 0–200)
HDL: 52 mg/dL (ref >39)
LDL Cholesterol: 83 mg/dL (ref 0–99)
Total CHOL/HDL Ratio: 3.2 Ratio
Triglycerides: 164 mg/dL — ABNORMAL HIGH (ref <150)
VLDL: 33 mg/dL (ref 0–40)

## 2013-08-10 LAB — TSH: TSH: 0.829 u[IU]/mL (ref 0.350–4.500)

## 2013-08-10 LAB — HEMOGLOBIN A1C
Hgb A1c MFr Bld: 5.5 % (ref <5.7)
Mean Plasma Glucose: 111 mg/dL (ref <117)

## 2013-08-10 NOTE — Progress Notes (Addendum)
Chief Complaint  Patient presents with  . Hypertension    fasting med check.    Patient presents for routine follow-up.  Since her last visit she had CABG, and subsequently had re-admission in December with recurrent anginal symptoms.  She had early bypass graft (saphenous vein) failure possibly related to a period of time off of aspirin after surgery. She had stents placed, and is no longer having any anginal symptoms.  HTN:  BP last night was 126/67.  Denies headaches, dizziness.  No chest pain, shortness of breath.  She has some tightness in the right calf related to the surgery.  Walks regularly, denies claudication.  Denies any neuro symptoms.  Hyperlipidemia follow-up:  Patient is reportedly following a low-fat, low cholesterol diet.  Compliant with medications and denies medication side effects.  She started taking [employer] brand omega-3 fish oil at 1000 mg daily about 3 months ago.  Previously didn't tolerate fish oil due to GI side effects, but tolerating this brand.  Review of chart shows that her last lipids were checked prior to diagnosis of CAD, and LDL was 108, previously as low as 85 on same dose med.  (not rechecked or meds adjusted since CABG by cardiologist, pt not seen here since then).  Past Medical History  Diagnosis Date  . Hypertension     Dr.Henry Tamala Julian  . Hyperlipidemia   . Osteopenia 9/09  . Adenomatous colon polyp 10/09  . Chest pain on exertion     "escalating over last few months" (10/16/2012)  . Coronary artery disease   . PAF (paroxysmal atrial fibrillation)     during cath/notes 10/16/2012  . Decubitus ulcer of coccyx 10/22/12    1/2 inch raw open area on coccyx  . S/P CABG x 3 10/22/2012    LIMA to LAD, SVG to distal RCA, SVG to OM, EVH from right thigh  . S/P Maze operation for atrial fibrillation 10/22/2012    Left side lesion set using bipolar radiofrequency and cryothermy ablation with clipping of LA appendage   Past Surgical History  Procedure  Laterality Date  . Total abdominal hysterectomy w/ bilateral salpingoophorectomy  1990    benign growth  . Tonsillectomy  1949  . Abdominal hysterectomy    . Breast cyst excision Left 1980-1990's    x 3  . Maze N/A 10/22/2012    Procedure: MAZE;  Surgeon: Rexene Alberts, MD;  Location: [place];  Service: Open Heart Surgery;  Laterality: N/A;  . Intraoperative transesophageal echocardiogram N/A 10/22/2012    Procedure: INTRAOPERATIVE TRANSESOPHAGEAL ECHOCARDIOGRAM;  Surgeon: Rexene Alberts, MD;  Location: [place];  Service: Open Heart Surgery;  Laterality: N/A;  . Cardiac catheterization  10/16/2012  . Coronary angioplasty with stent placement  04/01/2013    "1" (06/01/2013)  . Coronary artery bypass graft N/A 10/22/2012    Procedure: CORONARY ARTERY BYPASS GRAFTING (CABG);  Surgeon: Rexene Alberts, MD;  Location: [place];  Service: Open Heart Surgery;  Laterality: N/A;  . Dilation and curettage of uterus     History   Social History  . Marital Status: Widowed    Spouse Name: N/A    Number of Children: 2  . Years of Education: N/A   Occupational History  . retired    Social History Main Topics  . Smoking status: Never Smoker   . Smokeless tobacco: Never Used  . Alcohol Use: 0.6 oz/week    1 Glasses of wine per week     Comment: 1 glass  of wine weekly (when playing cards)  . Drug Use: No  . Sexual Activity: No   Other Topics Concern  . 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  Retired Corporate treasurer.  1 son in [place], 1 son in [place].   Outpatient Encounter Prescriptions as of 08/10/2013  Medication Sig  . aspirin EC 81 MG tablet Take 1 tablet (81 mg total) by mouth daily.  Marland Kitchen atenolol (TENORMIN) 100 MG tablet Take 100 mg by mouth daily.  Marland Kitchen atorvastatin (LIPITOR) 10 MG tablet Take 10 mg by mouth daily.  . benazepril (LOTENSIN) 40 MG tablet Take 40 mg by mouth daily.  . beta carotene w/minerals (OCUVITE) tablet Take 1 tablet by mouth daily.  . cholecalciferol (VITAMIN D)  1000 UNITS tablet Take 1,000 Units by mouth daily.  . Glucosamine-Chondroitin-Vit D3 1500-1200-800 MG-MG-UNIT PACK Take 1,200 capsules by mouth daily. Joint pain  . hydrochlorothiazide (MICROZIDE) 12.5 MG capsule Take 12.5 mg by mouth daily.  . Multiple Vitamin (MULTIVITAMIN) tablet Take 1 tablet by mouth daily.  . Omega 3 1000 MG CAPS Take 1 capsule by mouth daily.  . Ticagrelor (BRILINTA) 90 MG TABS tablet Take 1 tablet (90 mg total) by mouth 2 (two) times daily.  . nitroGLYCERIN (NITROSTAT) 0.4 MG SL tablet Place 0.4 mg under the tongue every 5 (five) minutes as needed for chest pain.  . [DISCONTINUED] atorvastatin (LIPITOR) 10 MG tablet TAKE 1 TABLET (10 MG TOTAL) BY MOUTH DAILY.   Allergies  Allergen Reactions  . Oxycodone     Extreme nausea and vomiting   ROS:  Denies fevers, chills, chest pain, headaches, dizziness, numbness, tingling, memory or speech concerns.  Recent URI, resolving, just slight PND/cough.  Denies shortness of breath, bleeding, bruising, GI or GU complaints.  See HPI.  PHYSICAL EXAM: BP 150/70  Pulse 76  Ht '5\' 5"'  (1.651 m)  Wt 128 lb (58.06 kg)  BMI 21.30 kg/m2 144/70 on repeat by MD, RA. Occasional throat-clearing.  Well developed, pleasant female in no ditress HEENT:  PERRL, EOMI, conjunctiva clear.  OP clear without erythema Neck: no lymphadenopathy or thyromegaly.  Soft R carotid bruit Heart: regular rate and rhythm without murmur Lungs: clear bilaterally Abdomen: soft, nontender, no organomegaly or masses, no bruit Extremities:  2+ pulse, no edema Psych: normal mood, affect, hygiene and grooming Neuro: alert and oriented.  Cranial nerves intact, normal strength, gait.  ASSESSMENT/PLAN:  Pure hypercholesterolemia - goal LDL ideally <70; last check was >100.  recheck today and adjust statin dose if needed - Plan: Lipid panel  Essential hypertension, benign - normal at home.  continue to periodically monitor at home.  White Coat component.  Right  carotid bruit - check carotid u/s.  continue antiplatelet therapy - Plan: US Carotid Duplex Bilateral  CAD (coronary artery disease), autologous vein bypass graft - doing well after stent placement in December.  asymptomatic  S/P Maze operation for atrial fibrillation  Encounter for long-term (current) use of other medications - Plan: Lipid panel, Comprehensive metabolic panel, CBC with Differential, TSH  Impaired fasting glucose - Plan: Hemoglobin A1c   c-met, lipids, A1c, CBC, <TSH  Lipids--Recheck today.  Ideally LDL<70 is recommended, may need lipitor dose adjusted. If dose is adjusted, will need lab visit in 2-3 mos Otherwise, f/u in 6 mos AWV/med check plus Pharmacy--[facility]  Addendum--lipids improved, but LDL still >70.  Trial if increasing dose to 63m

## 2013-08-10 NOTE — Patient Instructions (Signed)
Continue your current medications. We will contact you with your results.  If LDL is >100 then your lipitor dose will be increased. We are scheduling you for carotid artery ultrasound.  If any disease is present, I suspect it is mild, and that your current therapy for your heart should be the same.  Please contact us if you develop any neurologic symptoms (numbness, weakness, trouble with speech, memory, etc)

## 2013-08-11 MED ORDER — ATORVASTATIN CALCIUM 20 MG PO TABS: 20.0000 mg | ORAL_TABLET | Freq: Every day | ORAL | Status: DC

## 2013-08-11 NOTE — Addendum Note (Signed)
Addended by: Rita Ohara on: 08/11/2013 07:53 AM   Modules accepted: Orders

## 2013-08-13 ENCOUNTER — Ambulatory Visit
Admission: RE | Admit: 2013-08-13 | Discharge: 2013-08-13 | Disposition: A | Discharge status: Home / Self Care | Payer: Medicare Other | Source: Ambulatory Visit | Attending: Family Medicine | Admitting: Family Medicine

## 2013-08-13 DIAGNOSIS — I6529 Occlusion and stenosis of unspecified carotid artery: Secondary | ICD-10-CM | POA: Diagnosis not present

## 2013-08-13 DIAGNOSIS — R0989 Other specified symptoms and signs involving the circulatory and respiratory systems: Secondary | ICD-10-CM

## 2013-08-17 ENCOUNTER — Other Ambulatory Visit: Payer: Self-pay | Admitting: *Deleted

## 2013-08-17 DIAGNOSIS — Z79899 Other long term (current) drug therapy: Secondary | ICD-10-CM

## 2013-08-17 DIAGNOSIS — E7800 Pure hypercholesterolemia, unspecified: Secondary | ICD-10-CM

## 2013-08-21 ENCOUNTER — Telehealth: Payer: Self-pay

## 2013-08-21 NOTE — Telephone Encounter (Signed)
Message copied by Lamar Laundry on Fri Aug 21, 2013  9:10 AM ------      Message from: Daneen Schick      Created: Thu Aug 20, 2013  3:37 PM       Less than 50% right carotid stenosis. Left is clear. Needs repeat in 1 year ------

## 2013-08-21 NOTE — Telephone Encounter (Signed)
pt given results of carotid  duplex.  Less than 50% right carotid stenosis. Left is clear. Needs repeat in 1 year.pt verbalized understanding.

## 2013-09-09 ENCOUNTER — Encounter: Payer: Self-pay | Admitting: Interventional Cardiology

## 2013-10-15 ENCOUNTER — Other Ambulatory Visit: Payer: Self-pay

## 2013-10-15 MED ORDER — ATENOLOL 100 MG PO TABS: 100.0000 mg | ORAL_TABLET | Freq: Every day | ORAL | Status: DC

## 2013-10-22 ENCOUNTER — Ambulatory Visit: Payer: Medicare Other | Admitting: Interventional Cardiology

## 2013-11-02 ENCOUNTER — Encounter: Payer: Self-pay | Admitting: Thoracic Surgery (Cardiothoracic Vascular Surgery)

## 2013-11-02 ENCOUNTER — Ambulatory Visit (INDEPENDENT_AMBULATORY_CARE_PROVIDER_SITE_OTHER): Payer: Medicare Other | Admitting: Thoracic Surgery (Cardiothoracic Vascular Surgery)

## 2013-11-02 VITALS — BP 128/74 | HR 64 | Resp 16 | Ht 67.0 in | Wt 130.0 lb

## 2013-11-02 DIAGNOSIS — Z951 Presence of aortocoronary bypass graft: Secondary | ICD-10-CM

## 2013-11-02 DIAGNOSIS — I4891 Unspecified atrial fibrillation: Secondary | ICD-10-CM | POA: Diagnosis not present

## 2013-11-02 DIAGNOSIS — Z9889 Other specified postprocedural states: Secondary | ICD-10-CM

## 2013-11-02 DIAGNOSIS — I2510 Atherosclerotic heart disease of native coronary artery without angina pectoris: Secondary | ICD-10-CM | POA: Diagnosis not present

## 2013-11-02 DIAGNOSIS — Z8679 Personal history of other diseases of the circulatory system: Secondary | ICD-10-CM

## 2013-11-02 NOTE — Progress Notes (Signed)
[address]       [place] [address]             [phone]     CARDIOTHORACIC SURGERY OFFICE NOTE  Referring Provider is Sinclair Grooms, MD PCP is Vikki Ports, MD   HPI:  Claudia Howell returns for routine followup status post coronary artery bypass grafting x3 and Maze procedure on 10/22/2012. She was last seen here in the office on 05/04/2013. Since then she was hospitalized in December for symptoms of angina pectoris. Cardiac catheterization was performed demonstrating premature graft failure with occlusion of the saphenous vein graft to right coronary artery and high-grade stenosis of saphenous vein graft to the circumflex system. She underwent PCI and stenting of the vein graft to the circumflex and did well. Since then she has been followed closely by Dr. Tamala Julian. She has not had any recurrent symptoms of angina pectoris. She returns for office today and states that she continues to feel quite well. She is normal exercise tolerance with no symptoms of exertional chest pain or shortness of breath. She denies any history of tachypalpitations or dizzy spells. She has not had any clinical evidence for recurrent atrial fibrillation.    Current Outpatient Prescriptions  Medication Sig Dispense Refill  . aspirin EC 81 MG tablet Take 1 tablet (81 mg total) by mouth daily.      Marland Kitchen atenolol (TENORMIN) 100 MG tablet Take 1 tablet (100 mg total) by mouth daily.  90 tablet  1  . atorvastatin (LIPITOR) 20 MG tablet Take 1 tablet (20 mg total) by mouth daily.  90 tablet  1  . benazepril (LOTENSIN) 40 MG tablet Take 40 mg by mouth daily.      . beta carotene w/minerals (OCUVITE) tablet Take 1 tablet by mouth daily.      . cholecalciferol (VITAMIN D) 1000 UNITS tablet Take 1,000 Units by mouth daily.      . Glucosamine-Chondroitin-Vit D3 1500-1200-800 MG-MG-UNIT PACK Take 1,200 capsules by mouth daily. Joint pain      . hydrochlorothiazide (MICROZIDE) 12.5 MG capsule Take 12.5  mg by mouth daily.      . Multiple Vitamin (MULTIVITAMIN) tablet Take 1 tablet by mouth daily.      . nitroGLYCERIN (NITROSTAT) 0.4 MG SL tablet Place 0.4 mg under the tongue every 5 (five) minutes as needed for chest pain.      . Omega 3 1000 MG CAPS Take 1 capsule by mouth daily.      . Ticagrelor (BRILINTA) 90 MG TABS tablet Take 1 tablet (90 mg total) by mouth 2 (two) times daily.  60 tablet  11   No current facility-administered medications for this visit.      Physical Exam:   BP 128/74  Pulse 64  Resp 16  Ht 5\' 7"  (1.702 m)  Wt 130 lb (58.968 kg)  BMI 20.36 kg/m2  SpO2 98%  General:  Well-appearing  Chest:   Clear  CV:   Regular rate and rhythm without murmur  Incisions:  n/a  Abdomen:  Soft and nontender  Extremities:  Warm and well-perfused  Diagnostic Tests:  2 channel telemetry rhythm strip demonstrates normal sinus rhythm   Impression:  Claudia Howell is currently doing well 1 year status post coronary artery bypass grafting and Maze procedure. She is maintaining sinus rhythm and is free of any symptoms of angina or congestive heart failure. She did undergo catheterization with PCI and stenting for high-grade stenosis of the  saphenous vein graft to left circumflex system this past December. At the time the vein graft to the right coronary system was chronically occluded, although the RCA is notably relatively small and diffusely diseased.   Plan:  The Claudia Howell is scheduled to followup with Dr. Tamala Julian later this month. She will return to our office for routine followup and rhythm check in one year.  I spent in excess of 15 minutes during the conduct of this office consultation and >50% of this time involved direct face-to-face encounter with the Claudia Howell for counseling and/or coordination of their care.    Valentina Gu. Roxy Manns, MD 11/02/2013 1:11 PM

## 2013-11-04 ENCOUNTER — Other Ambulatory Visit: Payer: Medicare Other

## 2013-11-04 DIAGNOSIS — E7800 Pure hypercholesterolemia, unspecified: Secondary | ICD-10-CM

## 2013-11-04 DIAGNOSIS — Z79899 Other long term (current) drug therapy: Secondary | ICD-10-CM | POA: Diagnosis not present

## 2013-11-05 LAB — LIPID PANEL
Cholesterol: 147 mg/dL (ref 0–200)
HDL: 51 mg/dL (ref >39)
LDL Cholesterol: 69 mg/dL (ref 0–99)
Total CHOL/HDL Ratio: 2.9 Ratio
Triglycerides: 134 mg/dL (ref <150)
VLDL: 27 mg/dL (ref 0–40)

## 2013-11-05 LAB — HEPATIC FUNCTION PANEL
ALT: 14 U/L (ref 0–35)
AST: 17 U/L (ref 0–37)
Albumin: 4.1 g/dL (ref 3.5–5.2)
Alkaline Phosphatase: 41 U/L (ref 39–117)
Bilirubin, Direct: 0.1 mg/dL (ref 0.0–0.3)
Indirect Bilirubin: 0.4 mg/dL (ref 0.2–1.2)
Total Bilirubin: 0.5 mg/dL (ref 0.2–1.2)
Total Protein: 6.8 g/dL (ref 6.0–8.3)

## 2013-11-10 ENCOUNTER — Ambulatory Visit (INDEPENDENT_AMBULATORY_CARE_PROVIDER_SITE_OTHER): Payer: Medicare Other | Admitting: Interventional Cardiology

## 2013-11-10 ENCOUNTER — Encounter: Payer: Self-pay | Admitting: Interventional Cardiology

## 2013-11-10 VITALS — BP 138/80 | HR 66 | Ht 67.0 in | Wt 131.0 lb

## 2013-11-10 DIAGNOSIS — E7800 Pure hypercholesterolemia, unspecified: Secondary | ICD-10-CM

## 2013-11-10 DIAGNOSIS — I10 Essential (primary) hypertension: Secondary | ICD-10-CM | POA: Diagnosis not present

## 2013-11-10 DIAGNOSIS — I4891 Unspecified atrial fibrillation: Secondary | ICD-10-CM

## 2013-11-10 DIAGNOSIS — I25810 Atherosclerosis of coronary artery bypass graft(s) without angina pectoris: Secondary | ICD-10-CM

## 2013-11-10 NOTE — Progress Notes (Signed)
Patient ID: Claudia Howell, female   DOB: 03-Oct-1940, 73 y.o.   MRN: [id number]    [address], [address] [place], [place]  [address] Phone: [phone] Fax:  [phone]  Date:  11/10/2013   ID:  Claudia Howell, DOB 01/29/1941, MRN [id number]  PCP:  Vikki Ports, MD   ASSESSMENT:  1. Coronary artery disease, stable without angina after stenting the graft to the obtuse marginal 2. No recurrences of atrial fibrillation based upon clinical assessment 3. Hypertension, controlled 4. Carotid disease identified by Doppler with left carotid 50% stenosis  PLAN:  1. Clinical observation of carotid disease 2. Active lifestyle and call if angina 3. No change in medical therapy   SUBJECTIVE: Claudia Howell is a 73 y.o. female who is doing well status post stenting of the circumflex bypass graft. The right coronary territory it was not patent but collaterals apparently have helped to resolve symptoms completely.   Wt Readings from Last 3 Encounters:  11/10/13 131 lb (59.421 kg)  11/02/13 130 lb (58.968 kg)  08/10/13 128 lb (58.06 kg)     Past Medical History  Diagnosis Date  . Hypertension     Dr.Jalena Vanderlinden Tamala Julian  . Hyperlipidemia   . Osteopenia 9/09  . Adenomatous colon polyp 10/09  . Chest pain on exertion     "escalating over last few months" (10/16/2012)  . Coronary artery disease   . PAF (paroxysmal atrial fibrillation)     during cath/notes 10/16/2012  . Decubitus ulcer of coccyx 10/22/12    1/2 inch raw open area on coccyx  . S/P CABG x 3 10/22/2012    LIMA to LAD, SVG to distal RCA, SVG to OM, EVH from right thigh  . S/P Maze operation for atrial fibrillation 10/22/2012    Left side lesion set using bipolar radiofrequency and cryothermy ablation with clipping of LA appendage    Current Outpatient Prescriptions  Medication Sig Dispense Refill  . aspirin EC 81 MG tablet Take 1 tablet (81 mg total) by mouth daily.      Marland Kitchen atenolol (TENORMIN) 100 MG tablet Take 1 tablet (100 mg  total) by mouth daily.  90 tablet  1  . atorvastatin (LIPITOR) 20 MG tablet Take 1 tablet (20 mg total) by mouth daily.  90 tablet  1  . benazepril (LOTENSIN) 40 MG tablet Take 40 mg by mouth daily.      . beta carotene w/minerals (OCUVITE) tablet Take 1 tablet by mouth daily.      . cholecalciferol (VITAMIN D) 1000 UNITS tablet Take 1,000 Units by mouth daily.      . Glucosamine-Chondroitin-Vit D3 1500-1200-800 MG-MG-UNIT PACK Take 1,200 capsules by mouth daily. Joint pain      . hydrochlorothiazide (MICROZIDE) 12.5 MG capsule Take 12.5 mg by mouth daily.      . Multiple Vitamin (MULTIVITAMIN) tablet Take 1 tablet by mouth daily.      . nitroGLYCERIN (NITROSTAT) 0.4 MG SL tablet Place 0.4 mg under the tongue every 5 (five) minutes as needed for chest pain.      . Omega 3 1000 MG CAPS Take 1 capsule by mouth daily.      . Ticagrelor (BRILINTA) 90 MG TABS tablet Take 1 tablet (90 mg total) by mouth 2 (two) times daily.  60 tablet  11   No current facility-administered medications for this visit.    Allergies:    Allergies  Allergen Reactions  . Oxycodone     Extreme nausea  and vomiting    Social History:  The patient  reports that she has never smoked. She has never used smokeless tobacco. She reports that she drinks about .6 ounces of alcohol per week. She reports that she does not use illicit drugs.   ROS:  Please see the history of present illness.   Denies claudication and neuro complaints   All other systems reviewed and negative.   OBJECTIVE: VS:  BP 138/80  Pulse 66  Ht 5\' 7"  (1.702 m)  Wt 131 lb (59.421 kg)  BMI 20.51 kg/m2 Well nourished, well developed, in no acute distress, younger than stated age 46: normal Neck: JVD flat. Carotid bruit absent  Cardiac:  normal S1, S2; RRR; no murmur Lungs:  clear to auscultation bilaterally, no wheezing, rhonchi or rales Abd: soft, nontender, no hepatomegaly Ext: Edema absent. Pulses 2+ Skin: warm and dry Neuro:  CNs 2-12  intact, no focal abnormalities noted  EKG:  Not repeated       Signed, Illene Labrador III, MD 11/10/2013 9:39 AM

## 2013-11-10 NOTE — Patient Instructions (Signed)
Your physician recommends that you continue on your current medications as directed. Please refer to the Current Medication list given to you today.  Your physician wants you to follow-up in: 6 months. You will receive a reminder letter in the mail two months in advance. If you don't receive a letter, please call our office to schedule the follow-up appointment.  

## 2013-11-24 ENCOUNTER — Other Ambulatory Visit: Payer: Self-pay

## 2013-11-24 MED ORDER — BENAZEPRIL HCL 40 MG PO TABS: 40.0000 mg | ORAL_TABLET | Freq: Every day | ORAL | Status: DC

## 2013-12-01 ENCOUNTER — Encounter: Payer: Self-pay | Admitting: Family Medicine

## 2013-12-29 ENCOUNTER — Encounter: Payer: Self-pay | Admitting: Family Medicine

## 2013-12-30 ENCOUNTER — Other Ambulatory Visit: Payer: Self-pay | Admitting: *Deleted

## 2013-12-30 DIAGNOSIS — E7800 Pure hypercholesterolemia, unspecified: Secondary | ICD-10-CM

## 2014-02-09 DIAGNOSIS — H25049 Posterior subcapsular polar age-related cataract, unspecified eye: Secondary | ICD-10-CM | POA: Diagnosis not present

## 2014-02-09 DIAGNOSIS — H35369 Drusen (degenerative) of macula, unspecified eye: Secondary | ICD-10-CM | POA: Diagnosis not present

## 2014-02-09 DIAGNOSIS — H25019 Cortical age-related cataract, unspecified eye: Secondary | ICD-10-CM | POA: Diagnosis not present

## 2014-03-01 ENCOUNTER — Other Ambulatory Visit: Payer: Self-pay | Admitting: Family Medicine

## 2014-03-02 NOTE — Telephone Encounter (Signed)
She hasn't been tolerating med, cut dose in half, and has labs scheduled for this week, f/u next week.  She probably doesn't need it at all.  Deny it.

## 2014-03-02 NOTE — Telephone Encounter (Signed)
IS THIS OKAY FOR 90 DAYS AND NO REFILL

## 2014-03-03 ENCOUNTER — Other Ambulatory Visit: Payer: Medicare Other

## 2014-03-03 DIAGNOSIS — E7800 Pure hypercholesterolemia, unspecified: Secondary | ICD-10-CM | POA: Diagnosis not present

## 2014-03-04 LAB — LIPID PANEL
Cholesterol: 148 mg/dL (ref 0–200)
HDL: 47 mg/dL (ref >39)
LDL Cholesterol: 72 mg/dL (ref 0–99)
Total CHOL/HDL Ratio: 3.1 Ratio
Triglycerides: 143 mg/dL (ref <150)
VLDL: 29 mg/dL (ref 0–40)

## 2014-03-08 ENCOUNTER — Other Ambulatory Visit: Payer: Self-pay | Admitting: Family Medicine

## 2014-03-09 ENCOUNTER — Encounter: Payer: Self-pay | Admitting: Family Medicine

## 2014-03-09 ENCOUNTER — Ambulatory Visit (INDEPENDENT_AMBULATORY_CARE_PROVIDER_SITE_OTHER): Payer: Medicare Other | Admitting: Family Medicine

## 2014-03-09 VITALS — BP 128/68 | HR 68 | Ht 65.25 in | Wt 131.0 lb

## 2014-03-09 DIAGNOSIS — E7800 Pure hypercholesterolemia, unspecified: Secondary | ICD-10-CM

## 2014-03-09 DIAGNOSIS — I25810 Atherosclerosis of coronary artery bypass graft(s) without angina pectoris: Secondary | ICD-10-CM | POA: Diagnosis not present

## 2014-03-09 DIAGNOSIS — I6521 Occlusion and stenosis of right carotid artery: Secondary | ICD-10-CM

## 2014-03-09 DIAGNOSIS — I6529 Occlusion and stenosis of unspecified carotid artery: Secondary | ICD-10-CM | POA: Diagnosis not present

## 2014-03-09 DIAGNOSIS — I10 Essential (primary) hypertension: Secondary | ICD-10-CM | POA: Diagnosis not present

## 2014-03-09 MED ORDER — ATORVASTATIN CALCIUM 10 MG PO TABS: ORAL_TABLET | ORAL | Status: DC

## 2014-03-09 NOTE — Progress Notes (Signed)
Subjective:     Patient ID: Claudia Howell, female   DOB: 02/22/1941, 72 y.o.   MRN: [id number]  Claudia Howell presents for Follow-up  Chief Complaint  Patient presents with  . Follow-up    on lipid panel.    Patient presents to f/u on hyperlipidemia. She cut back on the atorvastatin dose from 20mg  to 10mg  back in early July.  This was due to severe leg cramps. She reports that while she still has leg cramps, they are now tolerable.  She had lipids drawn prior to her appointment and presents for follow-up.  She has cut back on her cheese, pizza.  Only has a hamburger once a month. Feels like she is eating a better diet.  CAD--she has had no further problems (since December).  She is participating in "sisters of the heart" support group at [facility], and enjoying that, learning a lot from the speakers. She has been on Brilinta for carotid stenosis since February's u/s (rx'd by Dr. Warnell Bureau was per pt report. Review of chart shows it was startedin December after getting stent.  She denies any bleeding. Bruising improved when she started taking Vitamin C.    Carotid u/s showed: Right carotid bifurcation and proximal ICA plaque, resulting in  less than 50% diameter stenosis. The exam does not exclude plaque  ulceration or embolization. Continued surveillance recommended.    Past Medical History  Diagnosis Date  . Hypertension     Dr.Henry Tamala Julian  . Hyperlipidemia   . Osteopenia 9/09  . Adenomatous colon polyp 10/09  . Chest pain on exertion     "escalating over last few months" (10/16/2012)  . Coronary artery disease   . PAF (paroxysmal atrial fibrillation)     during cath/notes 10/16/2012  . Decubitus ulcer of coccyx 10/22/12    1/2 inch raw open area on coccyx  . S/P CABG x 3 10/22/2012    LIMA to LAD, SVG to distal RCA, SVG to OM, EVH from right thigh  . S/P Maze operation for atrial fibrillation 10/22/2012    Left side lesion set using bipolar radiofrequency and cryothermy  ablation with clipping of LA appendage   Past Surgical History  Procedure Laterality Date  . Total abdominal hysterectomy w/ bilateral salpingoophorectomy  1990    benign growth  . Tonsillectomy  1949  . Abdominal hysterectomy    . Breast cyst excision Left 1980-1990's    x 3  . Maze N/A 10/22/2012    Procedure: MAZE;  Surgeon: Rexene Alberts, MD;  Location: [place];  Service: Open Heart Surgery;  Laterality: N/A;  . Intraoperative transesophageal echocardiogram N/A 10/22/2012    Procedure: INTRAOPERATIVE TRANSESOPHAGEAL ECHOCARDIOGRAM;  Surgeon: Rexene Alberts, MD;  Location: [place];  Service: Open Heart Surgery;  Laterality: N/A;  . Cardiac catheterization  10/16/2012  . Coronary angioplasty with stent placement  04/01/2013    "1" (06/01/2013)  . Coronary artery bypass graft N/A 10/22/2012    Procedure: CORONARY ARTERY BYPASS GRAFTING (CABG);  Surgeon: Rexene Alberts, MD;  Location: [place];  Service: Open Heart Surgery;  Laterality: N/A;  . Dilation and curettage of uterus     History   Social History  . Marital Status: Widowed    Spouse Name: N/A    Number of Children: 2  . Years of Education: N/A   Occupational History  . retired    Social History Main Topics  . Smoking status: Never Smoker   . Smokeless tobacco: Never  Used  . Alcohol Use: 0.6 oz/week    1 Glasses of wine per week     Comment: 1 glass of wine weekly (when playing cards)  . Drug Use: No  . Sexual Activity: No   Other Topics Concern  . 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  Retired Corporate treasurer.  1 son in [place], 1 son in [place].    Outpatient Encounter Prescriptions as of 03/09/2014  Medication Sig  . aspirin EC 81 MG tablet Take 1 tablet (81 mg total) by mouth daily.  Mar[place] Kitchen atenolol (TENORMIN) 100 MG tablet Take 1 tablet (100 mg total) by mouth daily.  Mar[place] Kitchen atorvastatin (LIPITOR) 20 MG tablet TAKE 1 TABLET BY MOUTH EVERY DAY  . benazepril (LOTENSIN) 40 MG tablet Take 1 tablet (40 mg total) by mouth  daily.  . cholecalciferol (VITAMIN D) 1000 UNITS tablet Take 1,000 Units by mouth daily.  . Glucosamine-Chondroitin-Vit D3 1500-1200-800 MG-MG-UNIT PACK Take 1,200 capsules by mouth daily. Joint pain  . hydrochlorothiazide (MICROZIDE) 12.5 MG capsule Take 12.5 mg by mouth daily.  . Multiple Vitamin (MULTIVITAMIN) tablet Take 1 tablet by mouth daily.  . Ticagrelor (BRILINTA) 90 MG TABS tablet Take 1 tablet (90 mg total) by mouth 2 (two) times daily.  . nitroGLYCERIN (NITROSTAT) 0.4 MG SL tablet Place 0.4 mg under the tongue every 5 (five) minutes as needed for chest pain.  . Omega 3 1000 MG CAPS Take 1 capsule by mouth daily.  . [DISCONTINUED] beta carotene w/minerals (OCUVITE) tablet Take 1 tablet by mouth daily.  (taking just 1/2 tablet of lipitor daily x 2 months)  Allergies  Allergen Reactions  . Oxycodone     Extreme nausea and vomiting    Review of Systems  She was having a lot of easy bruising, but that resolved after starting to take Vitamin C No fevers chills, URI symptoms, cough, shortness of breath, chest pain, palpitations, edema. Denies headaches, dizziness.  +leg cramps--tolerable/diminished. Denies depression.    Objective:     BP 128/68  Pulse 68  Ht 5' 5.25" (1.657 m)  Wt 131 lb (59.421 kg)  BMI 21.64 kg/m2  Physical Exam Well developed, pleasant female, in good spirits. HEENT: PERRL, EOMI, conjunctiva clear Neck: no lymphadenopathy or thyromegaly. Soft R carotid bruit  Heart: regular rate and rhythm without murmur  Lungs: clear bilaterally  Abdomen: soft, nontender, no organomegaly or masses, no bruit  Extremities: 2+ pulse, no edema  Psych: normal mood, affect, hygiene and grooming  Neuro: alert and oriented. Cranial nerves intact, normal strength, gait.    Lab Results  Component Value Date   CHOL 148 03/03/2014   HDL 47 03/03/2014   LDLCALC 72 03/03/2014   TRIG 143 03/03/2014   CHOLHDL 3.1 03/03/2014       Assessment and Plan       Pure  hypercholesterolemia - Goal is LDL<70.  She is close, didn't tolerate 20mg  dose.  continue 10mg  and low cholesterol diet - Plan: atorvastatin (LIPITOR) 10 MG tablet  Essential hypertension, benign - controlled  Carotid artery obstruction, right - asymptomatic.  continue anti-platelet therapy, aspirin, BP and lipid control. Will need repeat u/s on 07/2014  Coronary artery disease involving autologous vein coronary bypass graft without angina pectoris - s/p stent and doing well.  Asymptomatic. Continue current plan       Schedule AWV/med check + for 6 months.

## 2014-03-09 NOTE — Patient Instructions (Signed)
We sent the 10mg  dose to the pharmacy--make sure that you do not fill the 20mg  that was refilled for you yesterday.

## 2014-03-12 ENCOUNTER — Other Ambulatory Visit: Payer: Self-pay

## 2014-03-12 DIAGNOSIS — E785 Hyperlipidemia, unspecified: Secondary | ICD-10-CM

## 2014-03-23 ENCOUNTER — Other Ambulatory Visit: Payer: Self-pay | Admitting: *Deleted

## 2014-03-23 MED ORDER — BENAZEPRIL HCL 40 MG PO TABS: 40.0000 mg | ORAL_TABLET | Freq: Every day | ORAL | Status: DC

## 2014-03-25 ENCOUNTER — Other Ambulatory Visit (INDEPENDENT_AMBULATORY_CARE_PROVIDER_SITE_OTHER): Payer: Medicare Other

## 2014-03-25 DIAGNOSIS — Z23 Encounter for immunization: Secondary | ICD-10-CM

## 2014-04-13 DIAGNOSIS — K648 Other hemorrhoids: Secondary | ICD-10-CM | POA: Diagnosis not present

## 2014-04-13 DIAGNOSIS — D120 Benign neoplasm of cecum: Secondary | ICD-10-CM | POA: Diagnosis not present

## 2014-04-13 DIAGNOSIS — Z86010 Personal history of colonic polyps: Secondary | ICD-10-CM | POA: Diagnosis not present

## 2014-04-13 DIAGNOSIS — D125 Benign neoplasm of sigmoid colon: Secondary | ICD-10-CM | POA: Diagnosis not present

## 2014-04-13 DIAGNOSIS — D122 Benign neoplasm of ascending colon: Secondary | ICD-10-CM | POA: Diagnosis not present

## 2014-04-13 DIAGNOSIS — K6389 Other specified diseases of intestine: Secondary | ICD-10-CM | POA: Diagnosis not present

## 2014-04-13 DIAGNOSIS — Z09 Encounter for follow-up examination after completed treatment for conditions other than malignant neoplasm: Secondary | ICD-10-CM | POA: Diagnosis not present

## 2014-04-13 LAB — HM COLONOSCOPY

## 2014-04-18 ENCOUNTER — Other Ambulatory Visit: Payer: Self-pay | Admitting: Interventional Cardiology

## 2014-04-19 ENCOUNTER — Encounter: Payer: Self-pay | Admitting: *Deleted

## 2014-04-22 ENCOUNTER — Other Ambulatory Visit: Payer: Self-pay | Admitting: Interventional Cardiology

## 2014-04-24 ENCOUNTER — Other Ambulatory Visit: Payer: Self-pay

## 2014-04-24 MED ORDER — HYDROCHLOROTHIAZIDE 12.5 MG PO CAPS: 12.5000 mg | ORAL_CAPSULE | Freq: Every day | ORAL | Status: DC

## 2014-04-26 ENCOUNTER — Encounter: Payer: Self-pay | Admitting: Family Medicine

## 2014-05-03 ENCOUNTER — Ambulatory Visit (INDEPENDENT_AMBULATORY_CARE_PROVIDER_SITE_OTHER): Payer: Medicare Other | Admitting: Interventional Cardiology

## 2014-05-03 ENCOUNTER — Encounter: Payer: Self-pay | Admitting: Interventional Cardiology

## 2014-05-03 VITALS — BP 132/58 | HR 61 | Ht 67.0 in | Wt 131.8 lb

## 2014-05-03 DIAGNOSIS — I25810 Atherosclerosis of coronary artery bypass graft(s) without angina pectoris: Secondary | ICD-10-CM

## 2014-05-03 DIAGNOSIS — I10 Essential (primary) hypertension: Secondary | ICD-10-CM | POA: Diagnosis not present

## 2014-05-03 DIAGNOSIS — I6523 Occlusion and stenosis of bilateral carotid arteries: Secondary | ICD-10-CM | POA: Diagnosis not present

## 2014-05-03 DIAGNOSIS — Z9889 Other specified postprocedural states: Secondary | ICD-10-CM

## 2014-05-03 DIAGNOSIS — I480 Paroxysmal atrial fibrillation: Secondary | ICD-10-CM | POA: Diagnosis not present

## 2014-05-03 DIAGNOSIS — E785 Hyperlipidemia, unspecified: Secondary | ICD-10-CM

## 2014-05-03 DIAGNOSIS — Z8679 Personal history of other diseases of the circulatory system: Secondary | ICD-10-CM

## 2014-05-03 NOTE — Progress Notes (Signed)
Patient ID: Sondra Barges, female   DOB: [birth year], 73 y.o.   MRN: [id number]    [address], [address] [place], [place]  [address] Phone: [phone] Fax:  [phone]  Date:  05/03/2014   ID:  ANASTASIJA ANFINSON, DOB 11-27-1940, MRN [id number]  PCP:  Vikki Ports, MD   ASSESSMENT:  1. Coronary artery disease with prior coronary bypass grafting and vein graft failure with subsequent stenting and resolution of post operative angina. Stable since last office visit 2. Hyperlipidemia, on therapy 3. History of paroxysmal atrial fibrillation, asymptomatic . Status post maze and left atrial obliteration at the time of coronary artery bypass grafting 4. Hypertension, essential, stable  PLAN:  1. Aerobic activity 2. LDL cholesterol goal is less than 70. 3. No change in current medical regimen 4. Close follow-up diabetes 5. Discontinue Brilinta at the beginning of 2016.   SUBJECTIVE: KENSLEI HEARTY is a 73 y.o. female this doing well. No angina. Physical activity is unrestricted. Easy bruising on the combination of aspirin and Brilinta. She denies dyspnea. No prolonged palpitations, syncope, or transient neurological symptoms. No peripheral edema or orthopnea.   Wt Readings from Last 3 Encounters:  05/03/14 131 lb 12.8 oz (59.784 kg)  03/09/14 131 lb (59.421 kg)  11/10/13 131 lb (59.421 kg)     Past Medical History  Diagnosis Date  . Hypertension     Dr.Tyus Kallam Tamala Julian  . Hyperlipidemia   . Osteopenia 9/09  . Adenomatous colon polyp 10/09  . Chest pain on exertion     "escalating over last few months" (10/16/2012)  . Coronary artery disease   . PAF (paroxysmal atrial fibrillation)     during cath/notes 10/16/2012  . Decubitus ulcer of coccyx 10/22/12    1/2 inch raw open area on coccyx  . S/P CABG x 3 10/22/2012    LIMA to LAD, SVG to distal RCA, SVG to OM, EVH from right thigh  . S/P Maze operation for atrial fibrillation 10/22/2012    Left side lesion set using bipolar  radiofrequency and cryothermy ablation with clipping of LA appendage    Current Outpatient Prescriptions  Medication Sig Dispense Refill  . aspirin EC 81 MG tablet Take 1 tablet (81 mg total) by mouth daily.    Marland Kitchen atenolol (TENORMIN) 100 MG tablet Take 1 tablet (100 mg total) by mouth daily. 90 tablet 1  . atorvastatin (LIPITOR) 10 MG tablet TAKE 1 TABLET BY MOUTH EVERY DAY 90 tablet 1  . benazepril (LOTENSIN) 40 MG tablet TAKE 1 TABLET (40 MG TOTAL) BY MOUTH DAILY. 30 tablet 3  . cholecalciferol (VITAMIN D) 1000 UNITS tablet Take 1,000 Units by mouth daily.    . Glucosamine-Chondroitin-Vit D3 1500-1200-800 MG-MG-UNIT PACK Take 1,200 capsules by mouth daily. Joint pain    . hydrochlorothiazide (MICROZIDE) 12.5 MG capsule Take 1 capsule (12.5 mg total) by mouth daily. 30 capsule 2  . Multiple Vitamin (MULTIVITAMIN) tablet Take 1 tablet by mouth daily.    . nitroGLYCERIN (NITROSTAT) 0.4 MG SL tablet Place 0.4 mg under the tongue every 5 (five) minutes as needed for chest pain.    . Omega 3 1000 MG CAPS Take 1 capsule by mouth daily.    . Ticagrelor (BRILINTA) 90 MG TABS tablet Take 1 tablet (90 mg total) by mouth 2 (two) times daily. 60 tablet 11   No current facility-administered medications for this visit.    Allergies:    Allergies  Allergen Reactions  . Oxycodone Nausea And  Vomiting    Extreme nausea and vomiting    Social History:  The patient  reports that she has never smoked. She has never used smokeless tobacco. She reports that she drinks about 0.6 oz of alcohol per week. She reports that she does not use illicit drugs.   ROS:  Please see the history of present illness.   Easy bruising on dual antiplatelet therapy. No blood in urine or stool. Denies headache, syncope, abdominal pain, abdominal bloating, and claudication.   All other systems reviewed and negative.   OBJECTIVE: VS:  BP 132/58 mmHg  Pulse 61  Ht 5\' 7"  (1.702 m)  Wt 131 lb 12.8 oz (59.784 kg)  BMI 20.64  kg/m2 Well nourished, well developed, in no acute distress, younger than stated age 66: normal Neck: JVD flat. Carotid bruit left  Cardiac:  normal S1, S2; RRR; no murmur Lungs:  clear to auscultation bilaterally, no wheezing, rhonchi or rales Abd: soft, nontender, no hepatomegaly Ext: Edema absent. Pulses 2+ bilateral Skin: warm and dry Neuro:  CNs 2-12 intact, no focal abnormalities noted  EKG:  Sinus bradycardia/normal sinus rhythm with vertical axis and an interventricular conduction delay.       Signed, Illene Labrador III, MD 05/03/2014 10:13 AM

## 2014-05-03 NOTE — Patient Instructions (Signed)
Your physician has recommended you make the following change in your medication:  1) STOP Brilinta on 06/24/14  Take all other medications as prescribed  Your physician wants you to follow-up in: 1 year with Dr.Smith You will receive a reminder letter in the mail two months in advance. If you don't receive a letter, please call our office to schedule the follow-up appointment.

## 2014-05-05 ENCOUNTER — Encounter: Payer: Self-pay | Admitting: Family Medicine

## 2014-05-05 ENCOUNTER — Other Ambulatory Visit: Payer: Self-pay | Admitting: *Deleted

## 2014-05-05 DIAGNOSIS — Z79899 Other long term (current) drug therapy: Secondary | ICD-10-CM

## 2014-05-05 DIAGNOSIS — E7800 Pure hypercholesterolemia, unspecified: Secondary | ICD-10-CM

## 2014-05-05 MED ORDER — ROSUVASTATIN CALCIUM 10 MG PO TABS: 10.0000 mg | ORAL_TABLET | Freq: Every day | ORAL | Status: DC

## 2014-05-24 ENCOUNTER — Encounter: Payer: Self-pay | Admitting: Family Medicine

## 2014-05-24 ENCOUNTER — Other Ambulatory Visit: Payer: Self-pay | Admitting: *Deleted

## 2014-05-24 MED ORDER — ROSUVASTATIN CALCIUM 10 MG PO TABS: 10.0000 mg | ORAL_TABLET | Freq: Every day | ORAL | Status: DC

## 2014-06-03 ENCOUNTER — Encounter (HOSPITAL_COMMUNITY): Payer: Self-pay | Admitting: Interventional Cardiology

## 2014-06-05 ENCOUNTER — Encounter (HOSPITAL_COMMUNITY): Payer: Self-pay

## 2014-06-05 ENCOUNTER — Emergency Department (HOSPITAL_COMMUNITY)
Admission: EM | Admit: 2014-06-05 | Discharge: 2014-06-05 | Disposition: A | Discharge status: Home / Self Care | Payer: Medicare Other | Attending: Emergency Medicine | Admitting: Emergency Medicine

## 2014-06-05 DIAGNOSIS — Z9889 Other specified postprocedural states: Secondary | ICD-10-CM | POA: Diagnosis not present

## 2014-06-05 DIAGNOSIS — I10 Essential (primary) hypertension: Secondary | ICD-10-CM | POA: Insufficient documentation

## 2014-06-05 DIAGNOSIS — Z7982 Long term (current) use of aspirin: Secondary | ICD-10-CM | POA: Diagnosis not present

## 2014-06-05 DIAGNOSIS — I2510 Atherosclerotic heart disease of native coronary artery without angina pectoris: Secondary | ICD-10-CM | POA: Diagnosis not present

## 2014-06-05 DIAGNOSIS — Z8739 Personal history of other diseases of the musculoskeletal system and connective tissue: Secondary | ICD-10-CM | POA: Diagnosis not present

## 2014-06-05 DIAGNOSIS — E785 Hyperlipidemia, unspecified: Secondary | ICD-10-CM | POA: Diagnosis not present

## 2014-06-05 DIAGNOSIS — Z79899 Other long term (current) drug therapy: Secondary | ICD-10-CM | POA: Insufficient documentation

## 2014-06-05 DIAGNOSIS — R040 Epistaxis: Secondary | ICD-10-CM | POA: Insufficient documentation

## 2014-06-05 DIAGNOSIS — Z872 Personal history of diseases of the skin and subcutaneous tissue: Secondary | ICD-10-CM | POA: Insufficient documentation

## 2014-06-05 DIAGNOSIS — Z86010 Personal history of colonic polyps: Secondary | ICD-10-CM | POA: Diagnosis not present

## 2014-06-05 DIAGNOSIS — Z955 Presence of coronary angioplasty implant and graft: Secondary | ICD-10-CM | POA: Insufficient documentation

## 2014-06-05 DIAGNOSIS — Z951 Presence of aortocoronary bypass graft: Secondary | ICD-10-CM | POA: Insufficient documentation

## 2014-06-05 LAB — CBC WITH DIFFERENTIAL/PLATELET
Basophils Absolute: 0 10*3/uL (ref 0.0–0.1)
Basophils Relative: 1 % (ref 0–1)
Eosinophils Absolute: 0.1 10*3/uL (ref 0.0–0.7)
Eosinophils Relative: 2 % (ref 0–5)
HCT: 36.2 % (ref 36.0–46.0)
Hemoglobin: 11.9 g/dL — ABNORMAL LOW (ref 12.0–15.0)
Lymphocytes Relative: 17 % (ref 12–46)
Lymphs Abs: 1.4 10*3/uL (ref 0.7–4.0)
MCH: 30.5 pg (ref 26.0–34.0)
MCHC: 32.9 g/dL (ref 30.0–36.0)
MCV: 92.8 fL (ref 78.0–100.0)
Monocytes Absolute: 0.6 10*3/uL (ref 0.1–1.0)
Monocytes Relative: 7 % (ref 3–12)
Neutro Abs: 6 10*3/uL (ref 1.7–7.7)
Neutrophils Relative %: 73 % (ref 43–77)
Platelets: 189 10*3/uL (ref 150–400)
RBC: 3.9 MIL/uL (ref 3.87–5.11)
RDW: 12 % (ref 11.5–15.5)
WBC: 8.2 10*3/uL (ref 4.0–10.5)

## 2014-06-05 LAB — BASIC METABOLIC PANEL
Anion gap: 15 (ref 5–15)
BUN: 18 mg/dL (ref 6–23)
CO2: 22 mEq/L (ref 19–32)
Calcium: 8.9 mg/dL (ref 8.4–10.5)
Chloride: 100 mEq/L (ref 96–112)
Creatinine, Ser: 0.62 mg/dL (ref 0.50–1.10)
GFR calc Af Amer: >90 mL/min (ref >90)
GFR calc non Af Amer: 89 mL/min — ABNORMAL LOW (ref >90)
Glucose, Bld: 128 mg/dL — ABNORMAL HIGH (ref 70–99)
Potassium: 3.3 mEq/L — ABNORMAL LOW (ref 3.7–5.3)
Sodium: 137 mEq/L (ref 137–147)

## 2014-06-05 LAB — PROTIME-INR
INR: 1.18 (ref 0.00–1.49)
Prothrombin Time: 15.2 seconds (ref 11.6–15.2)

## 2014-06-05 LAB — APTT: aPTT: 33 seconds (ref 24–37)

## 2014-06-05 MED ORDER — OXYMETAZOLINE HCL 0.05 % NA SOLN
1.0000 | Freq: Once | NASAL | Status: AC
  Administered 2014-06-05: 1 via NASAL
  Filled 2014-06-05: qty 15

## 2014-06-05 NOTE — ED Notes (Signed)
Dr. Nanavati back at the bedside.  

## 2014-06-05 NOTE — ED Notes (Signed)
Pt holding pressure to nose as instructed by MD.

## 2014-06-05 NOTE — Discharge Instructions (Signed)
You had nose bleeds. Nose bleeds have ceased in the ER. We recommend 5 MINUTES, CONSTANT, UNINTERRUPTED PRESSURE to your nose if you re bleed. If the bleeding continues - come to the ER.   Nosebleed Nosebleeds can be caused by many conditions, including trauma, infections, polyps, foreign bodies, dry mucous membranes or climate, medicines, and air conditioning. Most nosebleeds occur in the front of the nose. Because of this location, most nosebleeds can be controlled by pinching the nostrils gently and continuously for at least 10 to 20 minutes. The long, continuous pressure allows enough time for the blood to clot. If pressure is released during that 10 to 20 minute time period, the process may have to be started again. The nosebleed may stop by itself or quit with pressure, or it may need concentrated heating (cautery) or pressure from packing. HOME CARE INSTRUCTIONS   If your nose was packed, try to maintain the pack inside until your health care provider removes it. If a gauze pack was used and it starts to fall out, gently replace it or cut the end off. Do not cut if a balloon catheter was used to pack the nose. Otherwise, do not remove unless instructed.  Avoid blowing your nose for 12 hours after treatment. This could dislodge the pack or clot and start the bleeding again.  If the bleeding starts again, sit up and bend forward, gently pinching the front half of your nose continuously for 20 minutes.  If bleeding was caused by dry mucous membranes, use over-the-counter saline nasal spray or gel. This will keep the mucous membranes moist and allow them to heal. If you must use a lubricant, choose the water-soluble variety. Use it only sparingly and not within several hours of lying down.  Do not use petroleum jelly or mineral oil, as these may drip into the lungs and cause serious problems.  Maintain humidity in your home by using less air conditioning or by using a humidifier.  Do not use  aspirin or medicines which make bleeding more likely. Your health care provider can give you recommendations on this.  Resume normal activities as you are able, but try to avoid straining, lifting, or bending at the waist for several days.  If the nosebleeds become recurrent and the cause is unknown, your health care provider may suggest laboratory tests. SEEK MEDICAL CARE IF: You have a fever. SEEK IMMEDIATE MEDICAL CARE IF:   Bleeding recurs and cannot be controlled.  There is unusual bleeding from or bruising on other parts of the body.  Nosebleeds continue.  There is any worsening of the condition which originally brought you in.  You become light-headed, feel faint, become sweaty, or vomit blood. MAKE SURE YOU:   Understand these instructions.  Will watch your condition.  Will get help right away if you are not doing well or get worse. Document Released: 03/21/2005 Document Revised: 10/26/2013 Document Reviewed: 05/12/2009 [facility] Patient Information 2015 [place], [place]. This information is not intended to replace advice given to you by your health care provider. Make sure you discuss any questions you have with your health care provider.

## 2014-06-05 NOTE — ED Notes (Signed)
Pt remains monitored by blood pressure, pulse ox, and 5 lead.  

## 2014-06-05 NOTE — ED Notes (Signed)
Pt states that her nose started bleeding about 12 hours ago. Pt states that she has used a box of tissues since she started bleeding. Pt states that "it is coming out in clots". Pt denies pain.

## 2014-06-05 NOTE — ED Notes (Signed)
MD at bedside. 

## 2014-06-05 NOTE — ED Provider Notes (Signed)
CSN: [id number]     Arrival date & time 06/05/14  0623 History   First MD Initiated Contact with Patient 06/05/14 0747     Chief Complaint  Patient presents with  . Epistaxis     (Consider location/radiation/quality/duration/timing/severity/associated sxs/prior Treatment) Patient is a 73 y.o. female presenting with nosebleeds. The history is provided by the patient.  Epistaxis Location:  R nare Severity:  Moderate Duration:  12 hours Timing:  Sporadic Chronicity:  New Context: anticoagulants and aspirin use   Context: not bleeding disorder, not nose picking, not thrombocytopenia and not trauma   Relieved by:  Nothing Ineffective treatments:  Applying pressure and vasoconstrictors Associated symptoms: blood in oropharynx   Risk factors: no liver disease     Past Medical History  Diagnosis Date  . Hypertension     Dr.Henry Tamala Julian  . Hyperlipidemia   . Osteopenia 9/09  . Adenomatous colon polyp 10/09  . Chest pain on exertion     "escalating over last few months" (10/16/2012)  . Coronary artery disease   . PAF (paroxysmal atrial fibrillation)     during cath/notes 10/16/2012  . Decubitus ulcer of coccyx 10/22/12    1/2 inch raw open area on coccyx  . S/P CABG x 3 10/22/2012    LIMA to LAD, SVG to distal RCA, SVG to OM, EVH from right thigh  . S/P Maze operation for atrial fibrillation 10/22/2012    Left side lesion set using bipolar radiofrequency and cryothermy ablation with clipping of LA appendage   Past Surgical History  Procedure Laterality Date  . Total abdominal hysterectomy w/ bilateral salpingoophorectomy  1990    benign growth  . Tonsillectomy  1949  . Abdominal hysterectomy    . Breast cyst excision Left 1980-1990's    x 3  . Maze N/A 10/22/2012    Procedure: MAZE;  Surgeon: Rexene Alberts, MD;  Location: [place];  Service: Open Heart Surgery;  Laterality: N/A;  . Intraoperative transesophageal echocardiogram N/A 10/22/2012    Procedure: INTRAOPERATIVE  TRANSESOPHAGEAL ECHOCARDIOGRAM;  Surgeon: Rexene Alberts, MD;  Location: [place];  Service: Open Heart Surgery;  Laterality: N/A;  . Cardiac catheterization  10/16/2012  . Coronary angioplasty with stent placement  04/01/2013    "1" (06/01/2013)  . Coronary artery bypass graft N/A 10/22/2012    Procedure: CORONARY ARTERY BYPASS GRAFTING (CABG);  Surgeon: Rexene Alberts, MD;  Location: [place];  Service: Open Heart Surgery;  Laterality: N/A;  . Dilation and curettage of uterus    . Left heart catheterization with coronary angiogram N/A 10/16/2012    Procedure: LEFT HEART CATHETERIZATION WITH CORONARY ANGIOGRAM;  Surgeon: Sinclair Grooms, MD;  Location: [facility] CATH LAB;  Service: Cardiovascular;  Laterality: N/A;  . Left heart catheterization with coronary/graft angiogram N/A 06/01/2013    Procedure: LEFT HEART CATHETERIZATION WITH Beatrix Fetters;  Surgeon: Sinclair Grooms, MD;  Location: [facility] CATH LAB;  Service: Cardiovascular;  Laterality: N/A;  . Percutaneous coronary stent intervention (pci-s)  06/01/2013    Procedure: PERCUTANEOUS CORONARY STENT INTERVENTION (PCI-S);  Surgeon: Sinclair Grooms, MD;  Location: [facility] C[facility] LAB;  Service: Cardiovascular;;   Family History  Problem Relation Age of Onset  . Hypertension Mother   . Heart attack Mother   . Heart disease Mother   . Hypertension Brother   . Cancer Brother     prostate  . Diabetes Neg Hx   . Breast cancer Neg Hx   . Colon cancer Neg  Hx    History  Substance Use Topics  . Smoking status: Never Smoker   . Smokeless tobacco: Never Used  . Alcohol Use: 0.6 oz/week    1 Glasses of wine per week     Comment: 1 glass of wine weekly (when playing cards)   OB History    Gravida Para Term Preterm AB TAB SAB Ectopic Multiple Living   3 2   1  1   2      Review of Systems  HENT: Positive for nosebleeds.   Respiratory: Negative for shortness of breath.   Skin: Negative for wound.  Hematological: Bruises/bleeds easily.       Allergies  Oxycodone  Home Medications   Prior to Admission medications   Medication Sig Start Date End Date Taking? Authorizing Provider  aspirin EC 81 MG tablet Take 1 tablet (81 mg total) by mouth daily. 05/04/13  Yes Rexene Alberts, MD  atenolol (TENORMIN) 100 MG tablet Take 1 tablet (100 mg total) by mouth daily. 10/15/13  Yes Belva Crome III, MD  atorvastatin (LIPITOR) 10 MG tablet TAKE 1 TABLET BY MOUTH EVERY DAY 03/09/14  Yes Rita Ohara, MD  benazepril (LOTENSIN) 40 MG tablet TAKE 1 TABLET (40 MG TOTAL) BY MOUTH DAILY. 04/21/14  Yes Belva Crome III, MD  cholecalciferol (VITAMIN D) 1000 UNITS tablet Take 1,000 Units by mouth daily.   Yes Historical Provider, MD  Glucosamine-Chondroitin-Vit D3 1500-1200-800 MG-MG-UNIT PACK Take 1,200 capsules by mouth daily. Joint pain   Yes Historical Provider, MD  hydrochlorothiazide (MICROZIDE) 12.5 MG capsule Take 1 capsule (12.5 mg total) by mouth daily. 04/24/14  Yes [place], MD  Multiple Vitamin (MULTIVITAMIN) tablet Take 1 tablet by mouth daily.   Yes Historical Provider, MD  nitroGLYCERIN (NITROSTAT) 0.4 MG SL tablet Place 0.4 mg under the tongue every 5 (five) minutes as needed for chest pain.   Yes Historical Provider, MD  Omega 3 1000 MG CAPS Take 1 capsule by mouth daily.   Yes Historical Provider, MD  rosuvastatin (CRESTOR) 10 MG tablet Take 1 tablet (10 mg total) by mouth daily. 05/24/14  Yes Rita Ohara, MD  Ticagrelor (BRILINTA) 90 MG TABS tablet Take 1 tablet (90 mg total) by mouth 2 (two) times daily. 06/02/13  Yes Belva Crome III, MD   BP 115/57 mmHg  Pulse 62  Temp(Src) 97.6 F (36.4 C) (Oral)  Resp 11  Ht 5\' 6"  (1.676 m)  Wt 130 lb (58.968 kg)  BMI 20.99 kg/m2  SpO2 98% Physical Exam  Constitutional: She appears well-developed.  HENT:  Pt examined after her clot was cleared, and pressure help for 5 minutes. There is no anterior source of bleed appreciated, and there was no clot seen. Pt has no active  bleeding currently. There was some red blood in the oropharynx. Pt has a hold in her septum - likely congenital.  Neck: Neck supple.  Cardiovascular: Normal rate.   Pulmonary/Chest: Effort normal.  Neurological: She is alert.  Nursing note and vitals reviewed.   ED Course  Procedures (including critical care time) Labs Review Labs Reviewed  CBC WITH DIFFERENTIAL - Abnormal; Notable for the following:    Hemoglobin 11.9 (*)    All other components within normal limits  BASIC METABOLIC PANEL - Abnormal; Notable for the following:    Potassium 3.3 (*)    Glucose, Bld 128 (*)    GFR calc non Af Amer 89 (*)    All other components within  normal limits  APTT  PROTIME-INR    Imaging Review No results found.   EKG Interpretation None      MDM   Final diagnoses:  Epistaxis    Pt on anticoagulant and antiplatelets from CAD comes in with cc of epistaxis, No cause illicited. Pressure applied, and the bleeding ceased. Pt observed for an hour - and hasn't had a rebleed. Will d/c with afrin. Return precautions discussed.   Varney Biles, MD 06/05/14 [phone]

## 2014-06-05 NOTE — ED Notes (Signed)
Dr. Kathrynn Humble at the bedside. ENT cart placed outside the room

## 2014-06-11 ENCOUNTER — Other Ambulatory Visit: Payer: Self-pay | Admitting: *Deleted

## 2014-06-11 MED ORDER — TICAGRELOR 90 MG PO TABS: 90.0000 mg | ORAL_TABLET | Freq: Two times a day (BID) | ORAL | Status: DC

## 2014-06-13 ENCOUNTER — Other Ambulatory Visit: Payer: Self-pay | Admitting: Interventional Cardiology

## 2014-07-07 ENCOUNTER — Other Ambulatory Visit: Payer: Medicare Other

## 2014-07-07 DIAGNOSIS — E7800 Pure hypercholesterolemia, unspecified: Secondary | ICD-10-CM

## 2014-07-07 DIAGNOSIS — Z79899 Other long term (current) drug therapy: Secondary | ICD-10-CM

## 2014-07-07 LAB — HEPATIC FUNCTION PANEL
ALT: 13 U/L (ref 0–35)
AST: 17 U/L (ref 0–37)
Albumin: 4.1 g/dL (ref 3.5–5.2)
Alkaline Phosphatase: 45 U/L (ref 39–117)
Bilirubin, Direct: 0.1 mg/dL (ref 0.0–0.3)
Indirect Bilirubin: 0.3 mg/dL (ref 0.2–1.2)
Total Bilirubin: 0.4 mg/dL (ref 0.2–1.2)
Total Protein: 6.7 g/dL (ref 6.0–8.3)

## 2014-07-07 LAB — LIPID PANEL
Cholesterol: 140 mg/dL (ref 0–200)
HDL: 46 mg/dL (ref >39)
LDL Cholesterol: 68 mg/dL (ref 0–99)
Total CHOL/HDL Ratio: 3 Ratio
Triglycerides: 129 mg/dL (ref <150)
VLDL: 26 mg/dL (ref 0–40)

## 2014-07-22 ENCOUNTER — Other Ambulatory Visit: Payer: Self-pay | Admitting: Interventional Cardiology

## 2014-07-25 ENCOUNTER — Other Ambulatory Visit: Payer: Self-pay | Admitting: Interventional Cardiology

## 2014-08-01 ENCOUNTER — Other Ambulatory Visit: Payer: Self-pay

## 2014-08-01 MED ORDER — HYDROCHLOROTHIAZIDE 12.5 MG PO CAPS: ORAL_CAPSULE | ORAL | Status: DC

## 2014-08-01 MED ORDER — BENAZEPRIL HCL 40 MG PO TABS: ORAL_TABLET | ORAL | Status: DC

## 2014-08-28 IMAGING — CR DG CHEST 2V
2 series · 2 of 2 positions shown · non-contrast
Comparison: Chest x-ray 10/25/2012.

CLINICAL DATA: Follow-up evaluation of pleural effusions.

CHEST - 2 VIEW

[w chest pa]
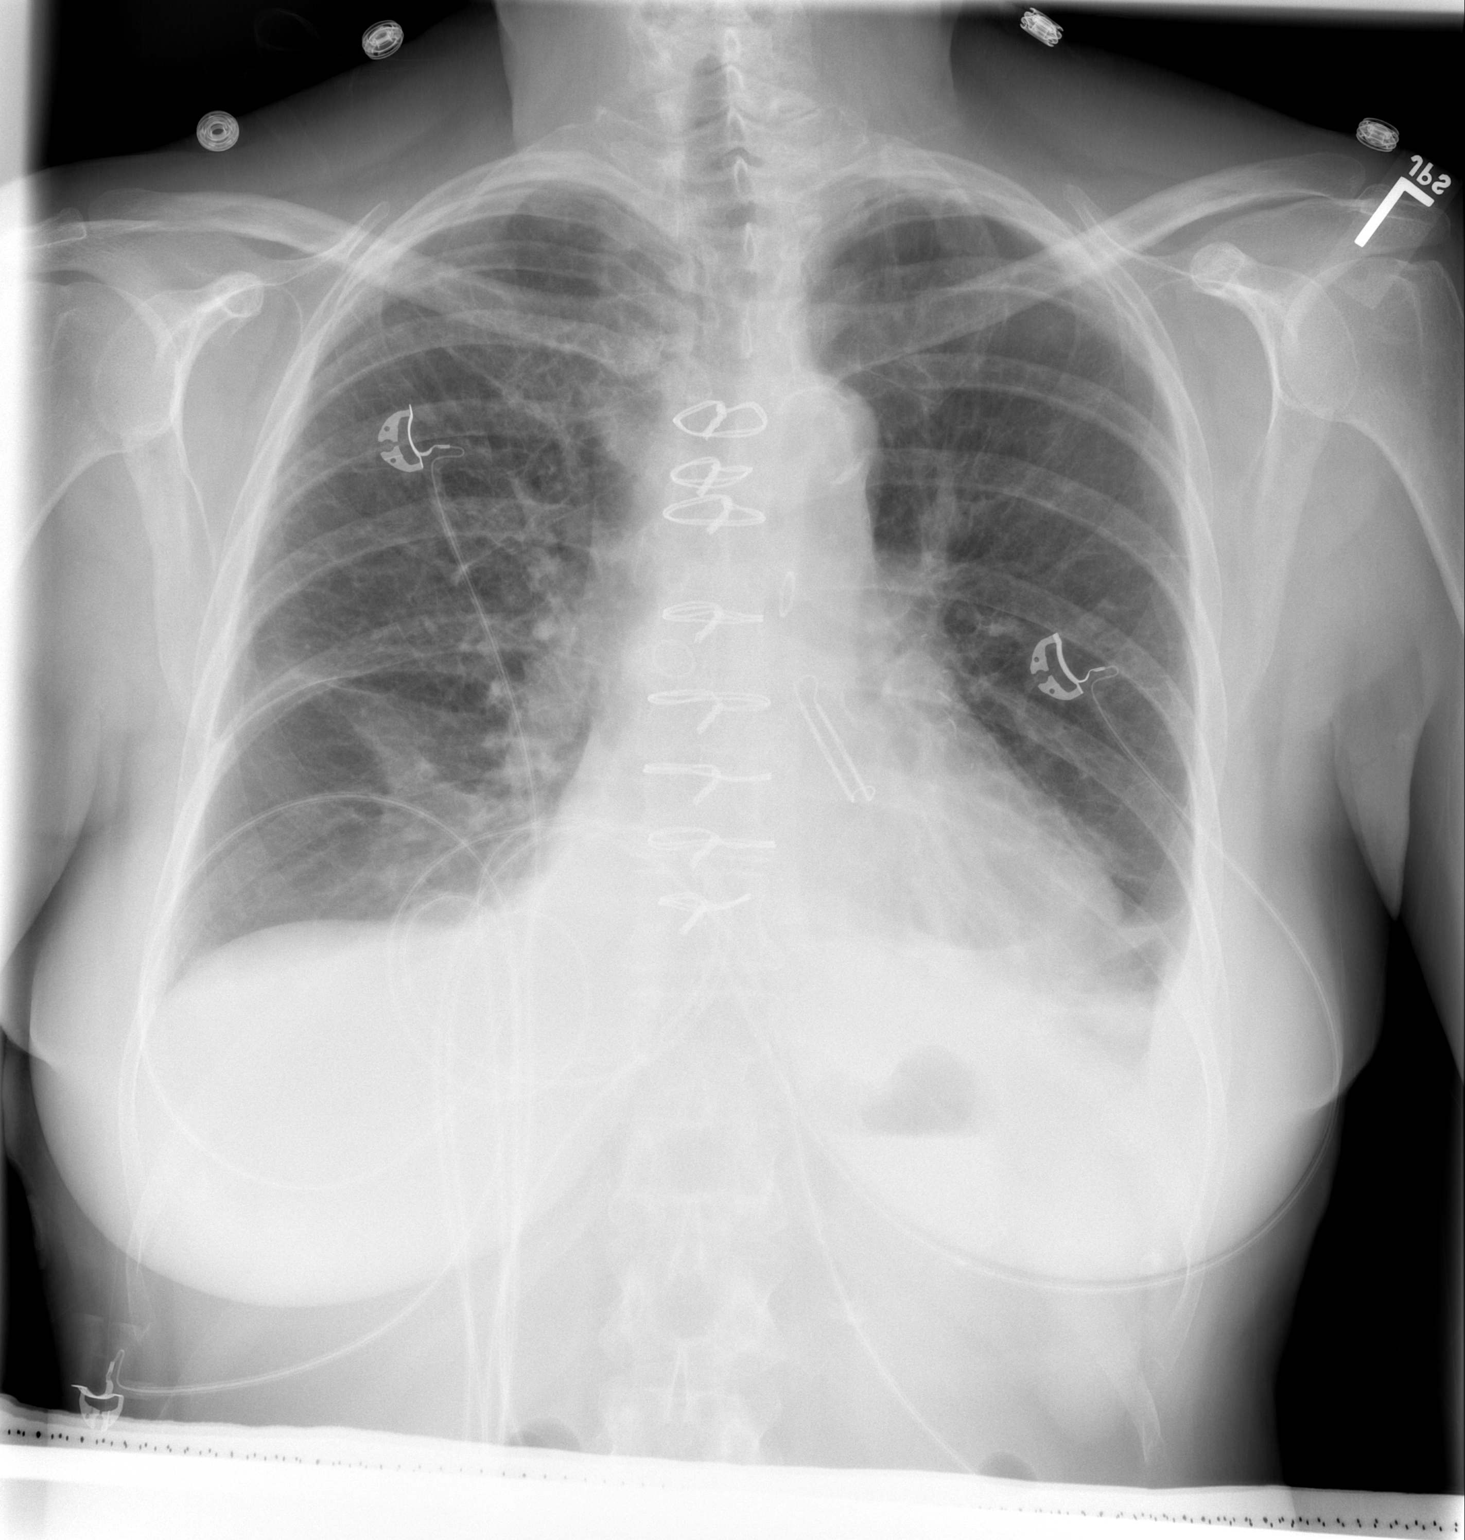

[w chest lat]
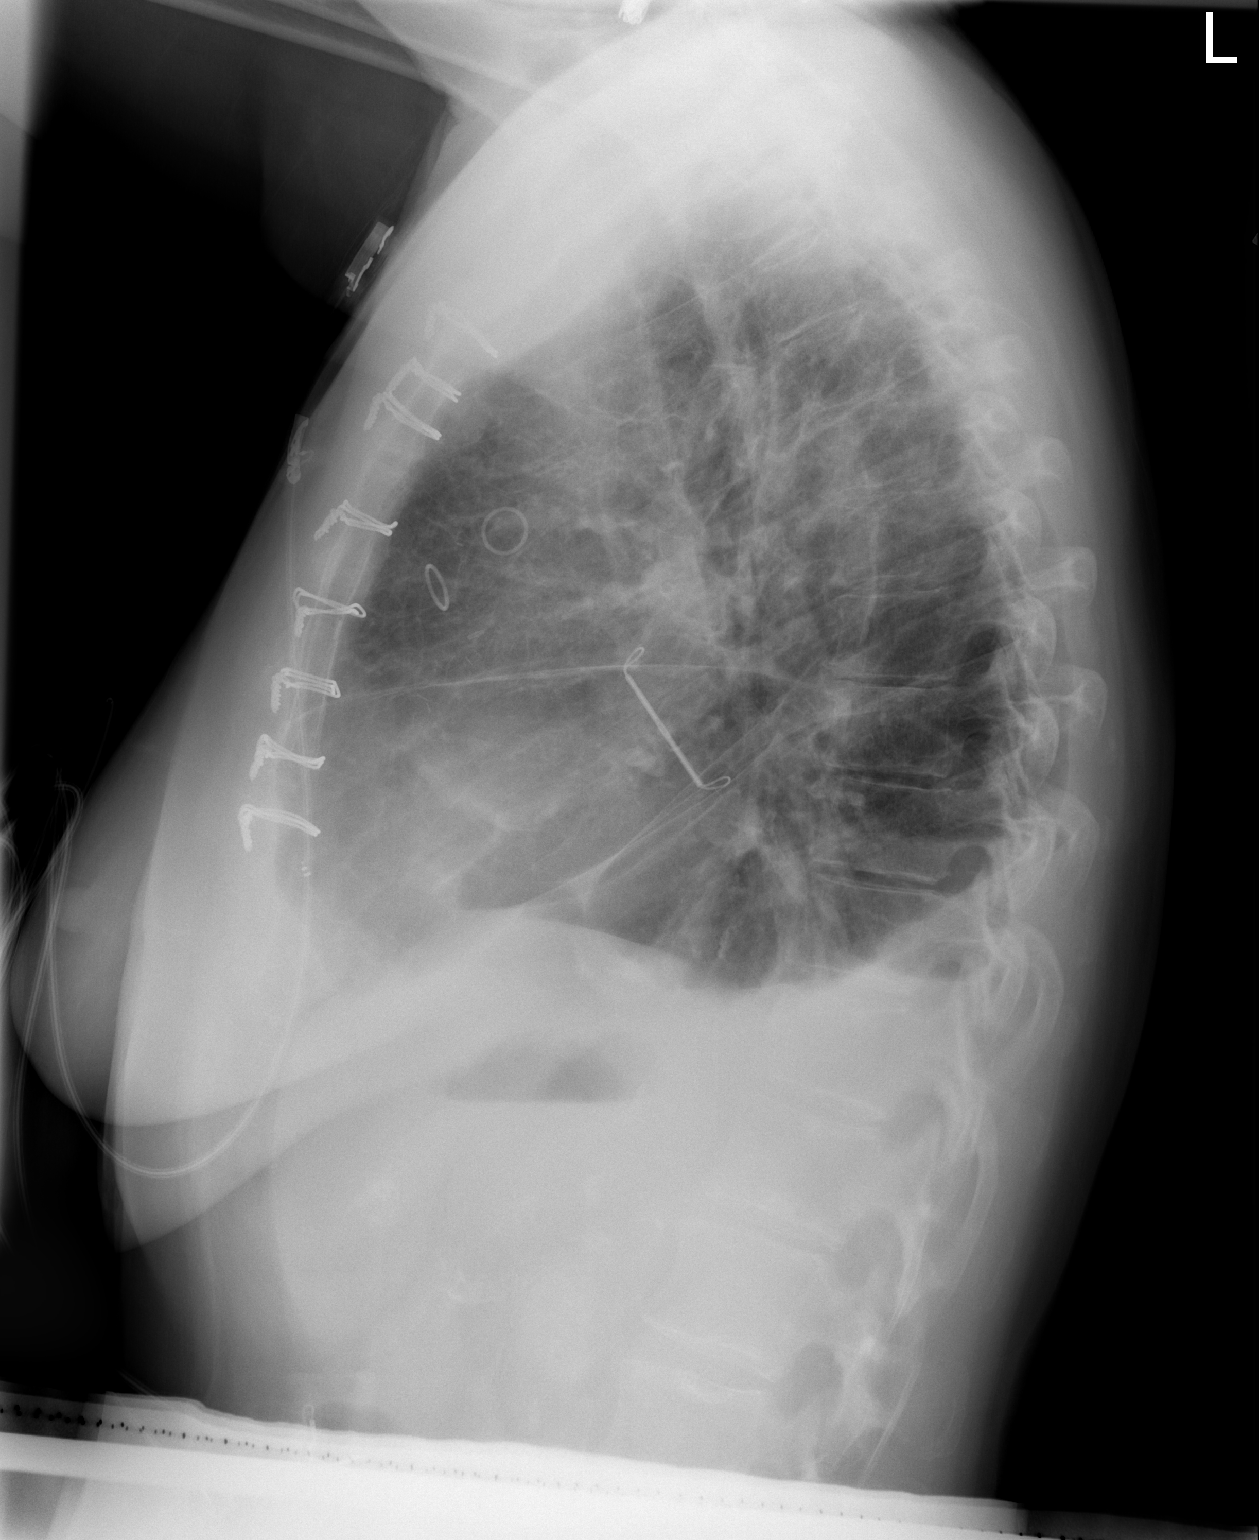

[2 of 2 positions shown; findings below may reference images not displayed]

FINDINGS: Lung volumes are normal.  Persistent bibasilar opacities
most compatible with resolving postoperative subsegmental
atelectasis and small bilateral pleural effusions (left greater
than right), which are unchanged compared to the prior examination.
Mild cephalization of the pulmonary vasculature is noted, without
frank pulmonary edema.  Heart size is upper limits of normal.
Mediastinal contours are unremarkable.  Atherosclerosis of the
thoracic aorta.  Postoperative changes of CABG and left atrial
appendage ligation are noted.
IMPRESSION: 1.  Persistent bibasilar postoperative subsegmental atelectasis and
small bilateral pleural effusions (left greater than right).

## 2014-09-05 ENCOUNTER — Other Ambulatory Visit: Payer: Self-pay | Admitting: Interventional Cardiology

## 2014-09-09 ENCOUNTER — Ambulatory Visit (INDEPENDENT_AMBULATORY_CARE_PROVIDER_SITE_OTHER): Payer: Medicare Other | Admitting: Family Medicine

## 2014-09-09 ENCOUNTER — Encounter: Payer: Self-pay | Admitting: Family Medicine

## 2014-09-09 VITALS — BP 150/80 | HR 60 | Ht 65.5 in | Wt 135.6 lb

## 2014-09-09 DIAGNOSIS — Z7189 Other specified counseling: Secondary | ICD-10-CM

## 2014-09-09 DIAGNOSIS — E876 Hypokalemia: Secondary | ICD-10-CM

## 2014-09-09 DIAGNOSIS — Z01419 Encounter for gynecological examination (general) (routine) without abnormal findings: Secondary | ICD-10-CM

## 2014-09-09 DIAGNOSIS — Z1211 Encounter for screening for malignant neoplasm of colon: Secondary | ICD-10-CM

## 2014-09-09 DIAGNOSIS — I25810 Atherosclerosis of coronary artery bypass graft(s) without angina pectoris: Secondary | ICD-10-CM

## 2014-09-09 DIAGNOSIS — Z5181 Encounter for therapeutic drug level monitoring: Secondary | ICD-10-CM | POA: Diagnosis not present

## 2014-09-09 DIAGNOSIS — I10 Essential (primary) hypertension: Secondary | ICD-10-CM | POA: Diagnosis not present

## 2014-09-09 DIAGNOSIS — Z23 Encounter for immunization: Secondary | ICD-10-CM | POA: Diagnosis not present

## 2014-09-09 DIAGNOSIS — Z0000 Encounter for general adult medical examination without abnormal findings: Secondary | ICD-10-CM | POA: Diagnosis not present

## 2014-09-09 DIAGNOSIS — R252 Cramp and spasm: Secondary | ICD-10-CM | POA: Diagnosis not present

## 2014-09-09 DIAGNOSIS — E7800 Pure hypercholesterolemia, unspecified: Secondary | ICD-10-CM

## 2014-09-09 DIAGNOSIS — R5383 Other fatigue: Secondary | ICD-10-CM | POA: Diagnosis not present

## 2014-09-09 DIAGNOSIS — I6521 Occlusion and stenosis of right carotid artery: Secondary | ICD-10-CM

## 2014-09-09 DIAGNOSIS — Z860100 Personal history of colon polyps, unspecified: Secondary | ICD-10-CM

## 2014-09-09 LAB — CBC WITH DIFFERENTIAL/PLATELET
Basophils Absolute: 0.1 10*3/uL (ref 0.0–0.1)
Basophils Relative: 1 % (ref 0–1)
Eosinophils Absolute: 0.2 10*3/uL (ref 0.0–0.7)
Eosinophils Relative: 3 % (ref 0–5)
HCT: 38.7 % (ref 36.0–46.0)
Hemoglobin: 12.7 g/dL (ref 12.0–15.0)
Lymphocytes Relative: 27 % (ref 12–46)
Lymphs Abs: 2.1 10*3/uL (ref 0.7–4.0)
MCH: 30.4 pg (ref 26.0–34.0)
MCHC: 32.8 g/dL (ref 30.0–36.0)
MCV: 92.6 fL (ref 78.0–100.0)
MPV: 9 fL (ref 8.6–12.4)
Monocytes Absolute: 0.6 10*3/uL (ref 0.1–1.0)
Monocytes Relative: 8 % (ref 3–12)
Neutro Abs: 4.6 10*3/uL (ref 1.7–7.7)
Neutrophils Relative %: 61 % (ref 43–77)
Platelets: 204 10*3/uL (ref 150–400)
RBC: 4.18 MIL/uL (ref 3.87–5.11)
RDW: 12.4 % (ref 11.5–15.5)
WBC: 7.6 10*3/uL (ref 4.0–10.5)

## 2014-09-09 LAB — HEMOCCULT GUIAC POC 1CARD (OFFICE): Fecal Occult Blood, POC: NEGATIVE

## 2014-09-09 LAB — BASIC METABOLIC PANEL
BUN: 11 mg/dL (ref 6–23)
CO2: 27 mEq/L (ref 19–32)
Calcium: 9.2 mg/dL (ref 8.4–10.5)
Chloride: 102 mEq/L (ref 96–112)
Creat: 0.6 mg/dL (ref 0.50–1.10)
Glucose, Bld: 92 mg/dL (ref 70–99)
Potassium: 3.9 mEq/L (ref 3.5–5.3)
Sodium: 137 mEq/L (ref 135–145)

## 2014-09-09 LAB — TSH: TSH: 0.805 u[IU]/mL (ref 0.350–4.500)

## 2014-09-09 LAB — MAGNESIUM: Magnesium: 2.1 mg/dL (ref 1.5–2.5)

## 2014-09-09 MED ORDER — ATORVASTATIN CALCIUM 10 MG PO TABS: ORAL_TABLET | ORAL | Status: DC

## 2014-09-09 NOTE — Patient Instructions (Signed)
  HEALTH MAINTENANCE RECOMMENDATIONS:  It is recommended that you get at least 30 minutes of aerobic exercise at least 5 days/week (for weight loss, you may need as much as 60-90 minutes). This can be any activity that gets your heart rate up. This can be divided in 10-15 minute intervals if needed, but try and build up your endurance at least once a week.  Weight bearing exercise is also recommended twice weekly.  Eat a healthy diet with lots of vegetables, fruits and fiber.  "Colorful" foods have a lot of vitamins (ie green vegetables, tomatoes, red peppers, etc).  Limit sweet tea, regular sodas and alcoholic beverages, all of which has a lot of calories and sugar.  Up to 1 alcoholic drink daily may be beneficial for women (unless trying to lose weight, watch sugars).  Drink a lot of water.  Calcium recommendations are 1200-1500 mg daily (1500 mg for postmenopausal women or women without ovaries), and vitamin D 1000 IU daily.  This should be obtained from diet and/or supplements (vitamins), and calcium should not be taken all at once, but in divided doses.  Monthly self breast exams and yearly mammograms for women over the age of 39 is recommended.  Sunscreen of at least SPF 30 should be used on all sun-exposed parts of the skin when outside between the hours of 10 am and 4 pm (not just when at beach or pool, but even with exercise, golf, tennis, and yard work!)  Use a sunscreen that says "broad spectrum" so it covers both UVA and UVB rays, and make sure to reapply every 1-2 hours.  Remember to change the batteries in your smoke detectors when changing your clock times in the spring and fall.  Use your seat belt every time you are in a car, and please drive safely and not be distracted with cell phones and texting while driving.   Please schedule your mammogram. We will be calling you with your carotid ultrasound appointment.

## 2014-09-09 NOTE — Progress Notes (Signed)
Chief Complaint  Patient presents with  . Med check plus    fasting med check plus with pelvic exam. No concerns.   Claudia Howell is a 74 y.o. female who presents for annual wellness visit and follow-up on chronic medical conditions.  She has the following concerns:  ER visit 3 months ago for epistaxis. Resolved with pressure. Discharged with Afrin spray and had no further problems. She stopped taking Brilinta at that time (rather than waiting until January).  CAD:  She last saw Dr. Tamala Julian in November 2015.  Brilinta was stopped after the ER visit for nosebleed (was going to be stopped in 2016 anyway). No other med changes were made.  Goal LDL <70.  Hyperlipidemia:  Atorvastatin dose had been cut from 20mg  to 10mg  due to side effects. Last check was in January and LDL was 68. Following a low cholesterol diet. (Her leg cramps were even worse on Crestor in the past). Tolerable on the 10mg  of lipitor.    Hypertension follow-up:  Blood pressures elsewhere are 125-130/65-70's (at home).  Denies dizziness, headaches, chest pain.  Denies side effects of medications. Potassium was noted to be 3.3 at ER visit 3 months ago.  Carotid artery disease: ultrasound last year showed: Right carotid bifurcation and proximal ICA plaque, resulting in  less than 50% diameter stenosis. The exam does not exclude plaque  ulceration or embolization. Continued surveillance recommended--due now. She denies any neuro symptoms.   Immunization History  Administered Date(s) Administered  . Influenza Split 05/07/2012  . Influenza, High Dose Seasonal PF 04/22/2013, 03/25/2014  . Pneumococcal Polysaccharide-23 06/30/2009  . Td 06/25/2002  . Tdap 01/23/2009  . Zoster 01/24/2008   Last Pap smear: n/a (hysterectomy for benign reasons) Last mammogram: 09/2011 Last colonoscopy: 03/2014--diverticulosis, polyps (not biopsied), internal hemorrhoids. Repeat colonoscopy was recommended in a month for treatment/biopsy.  She was  waiting to be off Brilinta, and plans to do in April Carotid ultrasound 08/13/13 Last DEXA: 09/2011 (very mild osteopenia) Exercise: dances around the house to music frequently.  50 min "reverse aging" workout (stretching). Walks 2-3 times/week.  Weight-bearing exercise 4x/week Dentist: twice yearly Ophtho: every 2 years, went over the summer  Other doctors caring for patient include: Cardiology: Dr. Tamala Julian Ophtho: Dr. Peter Garter Dentist: Dr. Raelyn Ensign GI: Dr. Michail Sermon  Depression screen:  See scanned questionnaire.  Notable only for some trouble sleeping, and fatigue (told to be related to medications from cardiologist). No depressive symptoms ADL screen:  See scanned questionnaire.  Notable only for occasional trouble hearing.  End of Life Discussion:  Patient has a living will and medical power of attorney  Past Medical History  Diagnosis Date  . Hypertension     Dr.Henry Tamala Julian  . Hyperlipidemia   . Osteopenia 9/09  . Adenomatous colon polyp 10/09  . Chest pain on exertion     "escalating over last few months" (10/16/2012)  . Coronary artery disease   . PAF (paroxysmal atrial fibrillation)     during cath/notes 10/16/2012  . Decubitus ulcer of coccyx 10/22/12    1/2 inch raw open area on coccyx  . S/P CABG x 3 10/22/2012    LIMA to LAD, SVG to distal RCA, SVG to OM, EVH from right thigh  . S/P Maze operation for atrial fibrillation 10/22/2012    Left side lesion set using bipolar radiofrequency and cryothermy ablation with clipping of LA appendage    Past Surgical History  Procedure Laterality Date  . Total abdominal hysterectomy w/ bilateral salpingoophorectomy  1990    benign growth  . Tonsillectomy  1949  . Abdominal hysterectomy    . Breast cyst excision Left 1980-1990's    x 3  . Maze N/A 10/22/2012    Procedure: MAZE;  Surgeon: Rexene Alberts, MD;  Location: [facility];  Service: Open Heart Surgery;  Laterality: N/A;  . Intraoperative transesophageal echocardiogram N/A 10/22/2012     Procedure: INTRAOPERATIVE TRANSESOPHAGEAL ECHOCARDIOGRAM;  Surgeon: Rexene Alberts, MD;  Location: [facility];  Service: Open Heart Surgery;  Laterality: N/A;  . Cardiac catheterization  10/16/2012  . Coronary angioplasty with stent placement  04/01/2013    "1" (06/01/2013)  . Coronary artery bypass graft N/A 10/22/2012    Procedure: CORONARY ARTERY BYPASS GRAFTING (CABG);  Surgeon: Rexene Alberts, MD;  Location: [facility];  Service: Open Heart Surgery;  Laterality: N/A;  . Dilation and curettage of uterus    . Left heart catheterization with coronary angiogram N/A 10/16/2012    Procedure: LEFT HEART CATHETERIZATION WITH CORONARY ANGIOGRAM;  Surgeon: Sinclair Grooms, MD;  Location: [facility] CATH LAB;  Service: Cardiovascular;  Laterality: N/A;  . Left heart catheterization with coronary/graft angiogram N/A 06/01/2013    Procedure: LEFT HEART CATHETERIZATION WITH Beatrix Fetters;  Surgeon: Sinclair Grooms, MD;  Location: [facility] CATH LAB;  Service: Cardiovascular;  Laterality: N/A;  . Percutaneous coronary stent intervention (pci-s)  06/01/2013    Procedure: PERCUTANEOUS CORONARY STENT INTERVENTION (PCI-S);  Surgeon: Sinclair Grooms, MD;  Location: [facility] CATH LAB;  Service: Cardiovascular;;    History   Social History  . Marital Status: Widowed    Spouse Name: N/A  . Number of Children: 2  . Years of Education: N/A   Occupational History  . retired    Social History Main Topics  . Smoking status: Never Smoker   . Smokeless tobacco: Never Used  . Alcohol Use: 0.6 oz/week    1 Glasses of wine per week     Comment: 1 glass of wine weekly (when playing cards)  . Drug Use: No  . Sexual Activity: No   Other Topics Concern  . 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  Retired Corporate treasurer.  1 son in [place], 1 son in [place].    Family History  Problem Relation Age of Onset  . Hypertension Mother   . Heart attack Mother   . Heart disease Mother   . Hypertension Brother   . Cancer  Brother     prostate  . Diabetes Neg Hx   . Breast cancer Neg Hx   . Colon cancer Neg Hx   . Heart disease Other     x2 (age 48 and 48)    Outpatient Encounter Prescriptions as of 09/09/2014  Medication Sig  . aspirin EC 81 MG tablet Take 1 tablet (81 mg total) by mouth daily.  Marland Kitchen atenolol (TENORMIN) 100 MG tablet TAKE 1 TABLET (100 MG TOTAL) BY MOUTH DAILY.  Marland Kitchen atorvastatin (LIPITOR) 10 MG tablet TAKE 1 TABLET BY MOUTH EVERY DAY  . benazepril (LOTENSIN) 40 MG tablet TAKE 1 TABLET (40 MG TOTAL) BY MOUTH DAILY.  . cholecalciferol (VITAMIN D) 1000 UNITS tablet Take 1,000 Units by mouth daily.  . Glucosamine-Chondroitin-Vit D3 1500-1200-800 MG-MG-UNIT PACK Take 1,200 capsules by mouth daily. Joint pain  . hydrochlorothiazide (MICROZIDE) 12.5 MG capsule TAKE 1 CAPSULE (12.5 MG TOTAL) BY MOUTH DAILY.  . Magnesium 500 MG CAPS Take 1 capsule by mouth daily.  Marland Kitchen  Multiple Vitamin (MULTIVITAMIN) tablet Take 1 tablet by mouth daily.  . nitroGLYCERIN (NITROSTAT) 0.4 MG SL tablet Place 0.4 mg under the tongue every 5 (five) minutes as needed for chest pain.  . [DISCONTINUED] Omega 3 1000 MG CAPS Take 1 capsule by mouth daily.  . [DISCONTINUED] rosuvastatin (CRESTOR) 10 MG tablet Take 1 tablet (10 mg total) by mouth daily.  . [DISCONTINUED] ticagrelor (BRILINTA) 90 MG TABS tablet Take 1 tablet (90 mg total) by mouth 2 (two) times daily.    Allergies  Allergen Reactions  . Oxycodone Nausea And Vomiting    Extreme nausea and vomiting   ROS: The patient denies anorexia, fever, headaches, vision changes, decreased hearing, ear pain, sore throat, breast concerns, palpitations, dizziness, syncope, dyspnea on exertion, cough, swelling, nausea, vomiting, diarrhea, constipation, abdominal pain, melena, hematochezia, indigestion/heartburn, hematuria, incontinence, dysuria, vaginal bleeding, discharge, odor or itch, genital lesions, joint pains, weakness, tremor, suspicious skin lesions, depression, anxiety,  abnormal bleeding/bruising, or enlarged lymph nodes. +some trouble falling asleep; chamomille tea helps Skin has been itchy/dry (winter)--better when she uses lotion. Occasional hip pain (unchanged)  PHYSICAL EXAM:  BP 150/80 mmHg  Pulse 60  Ht 5' 5.5" (1.664 m)  Wt 135 lb 9.6 oz (61.508 kg)  BMI 22.21 kg/m2  General Appearance:  Alert, cooperative, no distress, appears stated age   Head:  Normocephalic, without obvious abnormality, atraumatic   Eyes:  PERRL, conjunctiva/corneas clear, EOM's intact, fundi  benign   Ears:  Normal TM's and external ear canals   Nose:  Nares normal, mucosa normal, no drainage or sinus tenderness   Throat:  Lips, mucosa, and tongue normal; teeth and gums normal   Neck:  Supple, no lymphadenopathy; thyroid: no enlargement/tenderness/nodules; no carotid  bruit noted today (previously noted bruit on right); no JVD   Back:  Spine nontender, no curvature, ROM normal, no CVA tenderness   Lungs:  Clear to auscultation bilaterally without wheezes, rales or ronchi; respirations unlabored   Chest Wall:  No tenderness or deformity. WHSS  Heart:  Regular rate and rhythm, S1 and S2 normal, no murmur, rub  or gallop   Breast Exam:  No tenderness, masses, or nipple discharge or inversion. No axillary lymphadenopathy. WHSS left breast  Abdomen:  Soft, non-tender, nondistended, normoactive bowel sounds,  no masses, no hepatosplenomegaly   Genitalia:  Normal external genitalia without lesions. BUS and vagina normal; normal bimanual exam without masses.   Rectal:  Normal tone, no masses or tenderness; heme negative brown stool  Extremities:  No clubbing, cyanosis or edema   Pulses:  2+ and symmetric all extremities   Skin:  Skin color, texture, turgor normal, no rashes or lesions   Lymph nodes:  Cervical, supraclavicular, and axillary nodes normal   Neurologic:  CNII-XII intact, normal strength, sensation and gait; reflexes 2+  and symmetric throughout   Psych:   Normal mood, affect, hygiene and grooming.         Lab Results  Component Value Date   CHOL 140 07/07/2014   HDL 46 07/07/2014   LDLCALC 68 07/07/2014   TRIG 129 07/07/2014   CHOLHDL 3.0 07/07/2014   Lab Results  Component Value Date   ALT 13 07/07/2014   AST 17 07/07/2014   ALKPHOS 45 07/07/2014   BILITOT 0.4 07/07/2014   ASSESSMENT/PLAN:  Medicare annual wellness visit, subsequent - Plan: Hemoccult - 1 Card (office)  Pure hypercholesterolemia - at goal (LDL<70) per January labs. continue 10mg  atorvastatin and low cholesterol diet. If above goal in  futue, prefers to add Zetia rather than raise dose - Plan: atorvastatin (LIPITOR) 10 MG tablet  Essential hypertension, benign - elevated in office today, but at goal at home.  continue current meds and monitoring at home - Plan: Basic metabolic panel  Coronary artery disease involving autologous vein coronary bypass graft without angina pectoris - stable.  Under Dr. Jeralene Peters Smith's care  Carotid artery obstruction, right - <50% on u/s from last year.  Due to repeat.   - Plan: US Carotid Duplex Bilateral  Medication monitoring encounter - Plan: Magnesium  Other fatigue - Plan: CBC with Differential/Platelet, TSH  Cramp of both lower extremities - Plan: Magnesium  Hypokalemia - Plan: Basic metabolic panel  Immunization due - Plan: Pneumococcal conjugate vaccine 13-valent  History of colonic polyps - polyp noted on colonoscopy in October; not biopsied due to being on Brilinta.  Due to schedule repeat with biopsy  Encounter for routine gynecological examination  Advance care planning - MOST form reviewed and updated.  Full Code, Full Care.  Has Living Will and healthcare power of attorney    Discussed monthly self breast exams and yearly mammogram; at least 30 minutes of aerobic activity at least 5 days/week, plus weight-bearing exercise at least 2x/week; proper  sunscreen use reviewed; healthy diet, including goals of calcium and vitamin D intake and alcohol recommendations (less than or equal to 1 drink/day) reviewed; regular seatbelt use; changing batteries in smoke detectors. Immunization recommendations discussed, Prevnar-13 today Colonoscopy recommendations reviewed--due now (to have repeat colonoscopy and have polyp removed.  Mammogram is past due--advised to schedule Carotid artery stenosis--recheck ultrasound. Osteopenia--declines repeat of DEXA given that she states she would not take any prescription medications if it was abnormal.  MOST form reviewed and updated. She is Full Code, Full Care   Medicare Attestation I have personally reviewed: The patient's medical and social history Their use of alcohol, tobacco or illicit drugs Their current medications and supplements The patient's functional ability including ADLs,fall risks, home safety risks, cognitive, and hearing and visual impairment Diet and physical activities Evidence for depression or mood disorders  The patient's weight, height, BMI, and visual acuity have been recorded in the chart.  I have made referrals, counseling, and provided education to the patient based on review of the above and I have provided the patient with a written personalized care plan for preventive services.     Makisha Marrin A, MD   09/09/2014

## 2014-10-27 DIAGNOSIS — D120 Benign neoplasm of cecum: Secondary | ICD-10-CM | POA: Diagnosis not present

## 2014-10-27 DIAGNOSIS — K635 Polyp of colon: Secondary | ICD-10-CM | POA: Diagnosis not present

## 2014-10-27 DIAGNOSIS — D125 Benign neoplasm of sigmoid colon: Secondary | ICD-10-CM | POA: Diagnosis not present

## 2014-10-27 DIAGNOSIS — K640 First degree hemorrhoids: Secondary | ICD-10-CM | POA: Diagnosis not present

## 2014-10-27 DIAGNOSIS — K5730 Diverticulosis of large intestine without perforation or abscess without bleeding: Secondary | ICD-10-CM | POA: Diagnosis not present

## 2014-10-27 DIAGNOSIS — D126 Benign neoplasm of colon, unspecified: Secondary | ICD-10-CM | POA: Diagnosis not present

## 2014-10-27 DIAGNOSIS — K6389 Other specified diseases of intestine: Secondary | ICD-10-CM | POA: Diagnosis not present

## 2014-10-27 DIAGNOSIS — Z09 Encounter for follow-up examination after completed treatment for conditions other than malignant neoplasm: Secondary | ICD-10-CM | POA: Diagnosis not present

## 2014-10-27 DIAGNOSIS — Z86010 Personal history of colonic polyps: Secondary | ICD-10-CM | POA: Diagnosis not present

## 2014-10-27 DIAGNOSIS — D122 Benign neoplasm of ascending colon: Secondary | ICD-10-CM | POA: Diagnosis not present

## 2014-10-27 LAB — HM COLONOSCOPY

## 2014-11-01 ENCOUNTER — Encounter: Payer: Self-pay | Admitting: Thoracic Surgery (Cardiothoracic Vascular Surgery)

## 2014-11-01 ENCOUNTER — Ambulatory Visit (INDEPENDENT_AMBULATORY_CARE_PROVIDER_SITE_OTHER): Payer: Medicare Other | Admitting: Thoracic Surgery (Cardiothoracic Vascular Surgery)

## 2014-11-01 VITALS — BP 147/91 | HR 73 | Resp 16 | Ht 67.0 in | Wt 130.0 lb

## 2014-11-01 DIAGNOSIS — Z951 Presence of aortocoronary bypass graft: Secondary | ICD-10-CM

## 2014-11-01 DIAGNOSIS — Z8679 Personal history of other diseases of the circulatory system: Secondary | ICD-10-CM

## 2014-11-01 DIAGNOSIS — I480 Paroxysmal atrial fibrillation: Secondary | ICD-10-CM | POA: Diagnosis not present

## 2014-11-01 DIAGNOSIS — Z9889 Other specified postprocedural states: Secondary | ICD-10-CM

## 2014-11-01 DIAGNOSIS — I2510 Atherosclerotic heart disease of native coronary artery without angina pectoris: Secondary | ICD-10-CM | POA: Diagnosis not present

## 2014-11-01 NOTE — Progress Notes (Signed)
[address]       [place] [address]             [phone]     CARDIOTHORACIC SURGERY OFFICE NOTE  Referring Provider is Belva Crome, MD PCP is Vikki Ports, MD   HPI:  Patient returns for routine followup and rhythm check now 2 years status post coronary artery bypass grafting x3 and Maze procedure on 10/22/2012. She was last seen here in the office on 11/02/2013.  Since then she has continued to do very well. She has not had any symptoms of exertional chest discomfort or shortness of breath. She has not had any palpitations or other symptoms to suggest recurrent arrhythmias. She has been followed intermittently by Dr. Tamala Julian who saw her most recently on 05/03/2014. She did have an episode of severe epistaxis last December she was on dual antiplatelet therapy using aspirin and Brilinta. She was taken off of Brilinta at that time.  She reports that she is doing very well. She exercises on a regular basis and she reports no physical limitations whatsoever. She states that she only gets short of breath with very strenuous activity, and that does not limit her activities to any degree. She never has any exertional chest pain or chest tightness.   Current Outpatient Prescriptions  Medication Sig Dispense Refill  . aspirin EC 81 MG tablet Take 1 tablet (81 mg total) by mouth daily.    Marland Kitchen atenolol (TENORMIN) 100 MG tablet TAKE 1 TABLET (100 MG TOTAL) BY MOUTH DAILY. 90 tablet 1  . atorvastatin (LIPITOR) 10 MG tablet TAKE 1 TABLET BY MOUTH EVERY DAY 90 tablet 1  . benazepril (LOTENSIN) 40 MG tablet TAKE 1 TABLET (40 MG TOTAL) BY MOUTH DAILY. 90 tablet 1  . cholecalciferol (VITAMIN D) 1000 UNITS tablet Take 1,000 Units by mouth daily.    . Glucosamine-Chondroitin-Vit D3 1500-1200-800 MG-MG-UNIT PACK Take 1,200 capsules by mouth daily. Joint pain    . hydrochlorothiazide (MICROZIDE) 12.5 MG capsule TAKE 1 CAPSULE (12.5 MG TOTAL) BY MOUTH DAILY. 90 capsule 1  . Magnesium  500 MG CAPS Take 1 capsule by mouth daily.    . Multiple Vitamin (MULTIVITAMIN) tablet Take 1 tablet by mouth daily.     No current facility-administered medications for this visit.      Physical Exam:   BP 147/91 mmHg  Pulse 73  Resp 16  Ht 5\' 7"  (1.702 m)  Wt 130 lb (58.968 kg)  BMI 20.36 kg/m2  SpO2 99%  General:  Well-appearing  Chest:   clear  CV:   Regular rate and rhythm without murmur  Incisions:  n/a  Abdomen:  Soft and nontender  Extremities:   well-perfused  Diagnostic Tests:  2 channel telemetry rhythm strip demonstrates normal sinus rhythm   Impression:  Patient is doing very well approximately 2 years status post coronary artery bypass grafting and Maze procedure. She did undergo PCI and stenting of a vein graft to the left circumflex coronary artery in December 2014, but she has done very well since that time and she remains symptom free at present. She continues to maintain sinus rhythm.  Plan:  In the future the patient will call and return to see [place] as needed. She will be referred to the atrial fibrillation clinic for long-term surveillance following maze procedure. She will continue to follow-up with Dr. Tamala Julian for long-term follow-up of coronary artery disease and atrial fibrillation.  All of her questions have been  addressed.   I spent in excess of 10 minutes during the conduct of this office consultation and >50% of this time involved direct face-to-face encounter with the patient for counseling and/or coordination of their care.   Valentina Gu. Roxy Manns, MD 11/01/2014 3:23 PM

## 2014-11-08 ENCOUNTER — Ambulatory Visit: Payer: Medicare Other | Admitting: Thoracic Surgery (Cardiothoracic Vascular Surgery)

## 2014-11-09 ENCOUNTER — Encounter: Payer: Self-pay | Admitting: Family Medicine

## 2014-11-11 DIAGNOSIS — H25013 Cortical age-related cataract, bilateral: Secondary | ICD-10-CM | POA: Diagnosis not present

## 2014-11-11 DIAGNOSIS — H25043 Posterior subcapsular polar age-related cataract, bilateral: Secondary | ICD-10-CM | POA: Diagnosis not present

## 2014-12-02 ENCOUNTER — Encounter: Payer: Self-pay | Admitting: Interventional Cardiology

## 2014-12-03 ENCOUNTER — Other Ambulatory Visit: Payer: Self-pay

## 2014-12-03 MED ORDER — BENAZEPRIL HCL 40 MG PO TABS: ORAL_TABLET | ORAL | Status: DC

## 2014-12-16 ENCOUNTER — Other Ambulatory Visit: Payer: Self-pay | Admitting: Interventional Cardiology

## 2014-12-17 ENCOUNTER — Other Ambulatory Visit: Payer: Self-pay

## 2014-12-17 MED ORDER — ATENOLOL 100 MG PO TABS: ORAL_TABLET | ORAL | Status: DC

## 2015-02-04 DIAGNOSIS — H25011 Cortical age-related cataract, right eye: Secondary | ICD-10-CM | POA: Diagnosis not present

## 2015-02-04 DIAGNOSIS — H25012 Cortical age-related cataract, left eye: Secondary | ICD-10-CM | POA: Diagnosis not present

## 2015-02-04 DIAGNOSIS — H2512 Age-related nuclear cataract, left eye: Secondary | ICD-10-CM | POA: Diagnosis not present

## 2015-02-04 DIAGNOSIS — H2511 Age-related nuclear cataract, right eye: Secondary | ICD-10-CM | POA: Diagnosis not present

## 2015-02-25 ENCOUNTER — Other Ambulatory Visit: Payer: Self-pay | Admitting: Interventional Cardiology

## 2015-03-23 ENCOUNTER — Telehealth: Payer: Self-pay | Admitting: Interventional Cardiology

## 2015-03-23 NOTE — Telephone Encounter (Signed)
Cardiac clearance faxed this afternoon as requested

## 2015-03-23 NOTE — Telephone Encounter (Signed)
New message     Have we received medical clearance for pt to have cataract surgery?  She said they (Dr Tommy Rainwater office) faxed it several times and they have not received it back.  Please call pt and let her know status.

## 2015-03-23 NOTE — Telephone Encounter (Signed)
Follow up      Request for surgical clearance:  1. What type of surgery is being performed? cataract  When is this surgery scheduled?  03-28-15 Are there any medications that need to be held prior to surgery and how long? Medical clearance 2. Name of physician performing surgery? Dr Talbert Forest  3. What is your office phone and fax number? Fax [phone] 4. Multiple clearances have been faxed to [place]

## 2015-03-28 DIAGNOSIS — H25811 Combined forms of age-related cataract, right eye: Secondary | ICD-10-CM | POA: Diagnosis not present

## 2015-03-28 DIAGNOSIS — H2511 Age-related nuclear cataract, right eye: Secondary | ICD-10-CM | POA: Diagnosis not present

## 2015-03-28 DIAGNOSIS — H25043 Posterior subcapsular polar age-related cataract, bilateral: Secondary | ICD-10-CM | POA: Diagnosis not present

## 2015-03-29 DIAGNOSIS — H2512 Age-related nuclear cataract, left eye: Secondary | ICD-10-CM | POA: Diagnosis not present

## 2015-03-29 DIAGNOSIS — H25012 Cortical age-related cataract, left eye: Secondary | ICD-10-CM | POA: Diagnosis not present

## 2015-04-08 ENCOUNTER — Other Ambulatory Visit (INDEPENDENT_AMBULATORY_CARE_PROVIDER_SITE_OTHER): Payer: Medicare Other

## 2015-04-08 DIAGNOSIS — Z23 Encounter for immunization: Secondary | ICD-10-CM | POA: Diagnosis not present

## 2015-04-25 ENCOUNTER — Other Ambulatory Visit: Payer: Self-pay | Admitting: Interventional Cardiology

## 2015-04-29 DIAGNOSIS — H25043 Posterior subcapsular polar age-related cataract, bilateral: Secondary | ICD-10-CM | POA: Diagnosis not present

## 2015-04-29 DIAGNOSIS — H2512 Age-related nuclear cataract, left eye: Secondary | ICD-10-CM | POA: Diagnosis not present

## 2015-04-29 DIAGNOSIS — H25812 Combined forms of age-related cataract, left eye: Secondary | ICD-10-CM | POA: Diagnosis not present

## 2015-05-09 ENCOUNTER — Other Ambulatory Visit: Payer: Self-pay | Admitting: Family Medicine

## 2015-05-28 ENCOUNTER — Telehealth: Payer: Self-pay | Admitting: Medical

## 2015-05-28 ENCOUNTER — Other Ambulatory Visit: Payer: Self-pay | Admitting: Medical

## 2015-05-28 MED ORDER — OSELTAMIVIR PHOSPHATE 75 MG PO CAPS: 75.0000 mg | ORAL_CAPSULE | Freq: Two times a day (BID) | ORAL | Status: DC

## 2015-05-28 NOTE — Telephone Encounter (Signed)
Called patient for on call 05/28/15 at 9:17am Saturday.    Pt notes having the "flu."  Has 3-4 day hx/O body aches, chills, fever to 103, back pain, mild abdominal discomfort, no appetite, urinary urgency, but no diarrhea, nausea, vomiting, no URI symptoms.  She requested Tamiflu.  I advised that symptoms could be urinary/kidney infection, but can't rule out flu or other.  Advised she go to Urgent Care.   She was firm about wanting Tamiflu.   Sent Tamiflu, discussed risks/benefits, but advised she see Urgent Care to rule out urinary or other.

## 2015-05-29 ENCOUNTER — Other Ambulatory Visit: Payer: Self-pay | Admitting: Family Medicine

## 2015-05-29 MED ORDER — ONDANSETRON HCL 4 MG PO TABS: 4.0000 mg | ORAL_TABLET | Freq: Three times a day (TID) | ORAL | Status: DC | PRN

## 2015-05-29 NOTE — Telephone Encounter (Signed)
Claudia Howell--please call pt to see how she is feeling.  If not improving, offer OV.

## 2015-05-30 NOTE — Telephone Encounter (Signed)
Spoke with patient and she is doing somewhat better, fever is down but, cough is better. She will continue taking the Tamiflu, ondansetron as needed as well of ibuprofen. If she is not feeling better by Wed , or Thurs at the latest she will schedule appt with Dr.Knapp to follow up.

## 2015-05-30 NOTE — Telephone Encounter (Signed)
Left message for patient to return my call.

## 2015-06-03 ENCOUNTER — Encounter (HOSPITAL_COMMUNITY): Payer: Self-pay | Admitting: *Deleted

## 2015-06-03 ENCOUNTER — Encounter: Payer: Self-pay | Admitting: Family Medicine

## 2015-06-03 ENCOUNTER — Ambulatory Visit
Admission: RE | Admit: 2015-06-03 | Discharge: 2015-06-03 | Disposition: A | Discharge status: Home / Self Care | Payer: Medicare Other | Source: Ambulatory Visit | Attending: Family Medicine | Admitting: Family Medicine

## 2015-06-03 ENCOUNTER — Telehealth: Payer: Self-pay | Admitting: Family Medicine

## 2015-06-03 ENCOUNTER — Ambulatory Visit (INDEPENDENT_AMBULATORY_CARE_PROVIDER_SITE_OTHER): Payer: Medicare Other | Admitting: Family Medicine

## 2015-06-03 ENCOUNTER — Other Ambulatory Visit: Payer: Self-pay | Admitting: Interventional Cardiology

## 2015-06-03 ENCOUNTER — Emergency Department (HOSPITAL_COMMUNITY): Payer: Medicare Other

## 2015-06-03 ENCOUNTER — Inpatient Hospital Stay (HOSPITAL_COMMUNITY)
Admission: EM | Admit: 2015-06-03 | Discharge: 2015-06-05 | DRG: 293 | Disposition: A | Discharge status: Home / Self Care | Payer: Medicare Other | Attending: Cardiology | Admitting: Cardiology

## 2015-06-03 VITALS — BP 142/82 | HR 64 | Temp 97.8°F | Wt 142.6 lb

## 2015-06-03 DIAGNOSIS — I480 Paroxysmal atrial fibrillation: Secondary | ICD-10-CM | POA: Diagnosis present

## 2015-06-03 DIAGNOSIS — I509 Heart failure, unspecified: Secondary | ICD-10-CM | POA: Diagnosis not present

## 2015-06-03 DIAGNOSIS — I5023 Acute on chronic systolic (congestive) heart failure: Secondary | ICD-10-CM

## 2015-06-03 DIAGNOSIS — I2510 Atherosclerotic heart disease of native coronary artery without angina pectoris: Secondary | ICD-10-CM | POA: Diagnosis not present

## 2015-06-03 DIAGNOSIS — M8580 Other specified disorders of bone density and structure, unspecified site: Secondary | ICD-10-CM | POA: Diagnosis present

## 2015-06-03 DIAGNOSIS — Z951 Presence of aortocoronary bypass graft: Secondary | ICD-10-CM | POA: Diagnosis not present

## 2015-06-03 DIAGNOSIS — I10 Essential (primary) hypertension: Secondary | ICD-10-CM | POA: Diagnosis present

## 2015-06-03 DIAGNOSIS — R058 Other specified cough: Secondary | ICD-10-CM

## 2015-06-03 DIAGNOSIS — R05 Cough: Secondary | ICD-10-CM | POA: Diagnosis not present

## 2015-06-03 DIAGNOSIS — I5043 Acute on chronic combined systolic (congestive) and diastolic (congestive) heart failure: Secondary | ICD-10-CM | POA: Diagnosis present

## 2015-06-03 DIAGNOSIS — Z90710 Acquired absence of both cervix and uterus: Secondary | ICD-10-CM

## 2015-06-03 DIAGNOSIS — I5032 Chronic diastolic (congestive) heart failure: Secondary | ICD-10-CM | POA: Diagnosis present

## 2015-06-03 DIAGNOSIS — D649 Anemia, unspecified: Secondary | ICD-10-CM | POA: Diagnosis present

## 2015-06-03 DIAGNOSIS — R42 Dizziness and giddiness: Secondary | ICD-10-CM

## 2015-06-03 DIAGNOSIS — E785 Hyperlipidemia, unspecified: Secondary | ICD-10-CM | POA: Diagnosis present

## 2015-06-03 DIAGNOSIS — I110 Hypertensive heart disease with heart failure: Principal | ICD-10-CM | POA: Diagnosis present

## 2015-06-03 DIAGNOSIS — Z8249 Family history of ischemic heart disease and other diseases of the circulatory system: Secondary | ICD-10-CM

## 2015-06-03 DIAGNOSIS — Z7982 Long term (current) use of aspirin: Secondary | ICD-10-CM | POA: Diagnosis not present

## 2015-06-03 DIAGNOSIS — I5031 Acute diastolic (congestive) heart failure: Secondary | ICD-10-CM

## 2015-06-03 DIAGNOSIS — Z955 Presence of coronary angioplasty implant and graft: Secondary | ICD-10-CM

## 2015-06-03 DIAGNOSIS — J811 Chronic pulmonary edema: Secondary | ICD-10-CM

## 2015-06-03 DIAGNOSIS — R0600 Dyspnea, unspecified: Secondary | ICD-10-CM | POA: Diagnosis not present

## 2015-06-03 DIAGNOSIS — J810 Acute pulmonary edema: Secondary | ICD-10-CM | POA: Diagnosis not present

## 2015-06-03 LAB — COMPREHENSIVE METABOLIC PANEL
ALT: 36 U/L — ABNORMAL HIGH (ref 6–29)
AST: 28 U/L (ref 10–35)
Albumin: 3.1 g/dL — ABNORMAL LOW (ref 3.6–5.1)
Alkaline Phosphatase: 133 U/L — ABNORMAL HIGH (ref 33–130)
BUN: 14 mg/dL (ref 7–25)
CO2: 26 mmol/L (ref 20–31)
Calcium: 8.5 mg/dL — ABNORMAL LOW (ref 8.6–10.4)
Chloride: 96 mmol/L — ABNORMAL LOW (ref 98–110)
Creat: 0.94 mg/dL — ABNORMAL HIGH (ref 0.60–0.93)
Glucose, Bld: 94 mg/dL (ref 65–99)
Potassium: 3.6 mmol/L (ref 3.5–5.3)
Sodium: 134 mmol/L — ABNORMAL LOW (ref 135–146)
Total Bilirubin: 0.6 mg/dL (ref 0.2–1.2)
Total Protein: 6.4 g/dL (ref 6.1–8.1)

## 2015-06-03 LAB — BASIC METABOLIC PANEL
Anion gap: 11 (ref 5–15)
BUN: 16 mg/dL (ref 6–20)
CO2: 27 mmol/L (ref 22–32)
Calcium: 8.8 mg/dL — ABNORMAL LOW (ref 8.9–10.3)
Chloride: 95 mmol/L — ABNORMAL LOW (ref 101–111)
Creatinine, Ser: 1.02 mg/dL — ABNORMAL HIGH (ref 0.44–1.00)
GFR calc Af Amer: >60 mL/min (ref >60)
GFR calc non Af Amer: 54 mL/min — ABNORMAL LOW (ref >60)
Glucose, Bld: 108 mg/dL — ABNORMAL HIGH (ref 65–99)
Potassium: 3.6 mmol/L (ref 3.5–5.1)
Sodium: 133 mmol/L — ABNORMAL LOW (ref 135–145)

## 2015-06-03 LAB — CBC
HCT: 32.3 % — ABNORMAL LOW (ref 36.0–46.0)
Hemoglobin: 10.8 g/dL — ABNORMAL LOW (ref 12.0–15.0)
MCH: 30.8 pg (ref 26.0–34.0)
MCHC: 33.4 g/dL (ref 30.0–36.0)
MCV: 92 fL (ref 78.0–100.0)
Platelets: 245 10*3/uL (ref 150–400)
RBC: 3.51 MIL/uL — ABNORMAL LOW (ref 3.87–5.11)
RDW: 13.3 % (ref 11.5–15.5)
WBC: 12 10*3/uL — ABNORMAL HIGH (ref 4.0–10.5)

## 2015-06-03 LAB — CBC WITH DIFFERENTIAL/PLATELET
Basophils Absolute: 0 10*3/uL (ref 0.0–0.1)
Basophils Relative: 0 % (ref 0–1)
Eosinophils Absolute: 0.3 10*3/uL (ref 0.0–0.7)
Eosinophils Relative: 2 % (ref 0–5)
HCT: 30.8 % — ABNORMAL LOW (ref 36.0–46.0)
Hemoglobin: 10.3 g/dL — ABNORMAL LOW (ref 12.0–15.0)
Lymphocytes Relative: 13 % (ref 12–46)
Lymphs Abs: 1.8 10*3/uL (ref 0.7–4.0)
MCH: 31.1 pg (ref 26.0–34.0)
MCHC: 33.4 g/dL (ref 30.0–36.0)
MCV: 93.1 fL (ref 78.0–100.0)
MPV: 8.3 fL — ABNORMAL LOW (ref 8.6–12.4)
Monocytes Absolute: 1.2 10*3/uL — ABNORMAL HIGH (ref 0.1–1.0)
Monocytes Relative: 9 % (ref 3–12)
Neutro Abs: 10.3 10*3/uL — ABNORMAL HIGH (ref 1.7–7.7)
Neutrophils Relative %: 76 % (ref 43–77)
Platelets: 247 10*3/uL (ref 150–400)
RBC: 3.31 MIL/uL — ABNORMAL LOW (ref 3.87–5.11)
RDW: 13.4 % (ref 11.5–15.5)
WBC: 13.5 10*3/uL — ABNORMAL HIGH (ref 4.0–10.5)

## 2015-06-03 LAB — POC OCCULT BLOOD, ED: Fecal Occult Bld: NEGATIVE

## 2015-06-03 LAB — I-STAT TROPONIN, ED: Troponin i, poc: 0 ng/mL (ref 0.00–0.08)

## 2015-06-03 LAB — BRAIN NATRIURETIC PEPTIDE: B Natriuretic Peptide: 2098.5 pg/mL — ABNORMAL HIGH (ref 0.0–100.0)

## 2015-06-03 MED ORDER — POTASSIUM CHLORIDE CRYS ER 20 MEQ PO TBCR
40.0000 meq | EXTENDED_RELEASE_TABLET | Freq: Two times a day (BID) | ORAL | Status: DC
  Administered 2015-06-04 – 2015-06-05 (×4): 40 meq via ORAL
  Filled 2015-06-03 (×5): qty 2

## 2015-06-03 MED ORDER — ATENOLOL 100 MG PO TABS
100.0000 mg | ORAL_TABLET | Freq: Every day | ORAL | Status: DC
  Administered 2015-06-04 – 2015-06-05 (×2): 100 mg via ORAL
  Filled 2015-06-03 (×2): qty 1

## 2015-06-03 MED ORDER — FUROSEMIDE 10 MG/ML IJ SOLN
40.0000 mg | Freq: Once | INTRAMUSCULAR | Status: AC
  Administered 2015-06-03: 40 mg via INTRAVENOUS
  Filled 2015-06-03: qty 4

## 2015-06-03 MED ORDER — HEPARIN SODIUM (PORCINE) 5000 UNIT/ML IJ SOLN
5000.0000 [IU] | Freq: Three times a day (TID) | INTRAMUSCULAR | Status: DC
  Filled 2015-06-03 (×2): qty 1

## 2015-06-03 MED ORDER — SODIUM CHLORIDE 0.9 % IJ SOLN: 3.0000 mL | INTRAMUSCULAR | Status: DC | PRN

## 2015-06-03 MED ORDER — ONDANSETRON HCL 4 MG PO TABS: 4.0000 mg | ORAL_TABLET | Freq: Three times a day (TID) | ORAL | Status: DC | PRN

## 2015-06-03 MED ORDER — SODIUM CHLORIDE 0.9 % IJ SOLN
3.0000 mL | Freq: Two times a day (BID) | INTRAMUSCULAR | Status: DC
  Administered 2015-06-04 – 2015-06-05 (×3): 3 mL via INTRAVENOUS

## 2015-06-03 MED ORDER — ATORVASTATIN CALCIUM 10 MG PO TABS
10.0000 mg | ORAL_TABLET | Freq: Every day | ORAL | Status: DC
  Administered 2015-06-04 – 2015-06-05 (×2): 10 mg via ORAL
  Filled 2015-06-03 (×2): qty 1

## 2015-06-03 MED ORDER — ONDANSETRON HCL 4 MG/2ML IJ SOLN
4.0000 mg | Freq: Four times a day (QID) | INTRAMUSCULAR | Status: DC | PRN
Start: 2015-06-03 — End: 2015-06-05

## 2015-06-03 MED ORDER — ACETAMINOPHEN 325 MG PO TABS
650.0000 mg | ORAL_TABLET | ORAL | Status: DC | PRN
  Filled 2015-06-03: qty 2

## 2015-06-03 MED ORDER — BENZONATATE 200 MG PO CAPS: 200.0000 mg | ORAL_CAPSULE | Freq: Two times a day (BID) | ORAL | Status: DC | PRN

## 2015-06-03 MED ORDER — BENAZEPRIL HCL 40 MG PO TABS
40.0000 mg | ORAL_TABLET | Freq: Every day | ORAL | Status: DC
  Administered 2015-06-04 – 2015-06-05 (×2): 40 mg via ORAL
  Filled 2015-06-03 (×2): qty 1

## 2015-06-03 MED ORDER — ASPIRIN EC 81 MG PO TBEC
81.0000 mg | DELAYED_RELEASE_TABLET | Freq: Every day | ORAL | Status: DC
  Administered 2015-06-04 – 2015-06-05 (×2): 81 mg via ORAL
  Filled 2015-06-03 (×2): qty 1

## 2015-06-03 MED ORDER — SODIUM CHLORIDE 0.9 % IV SOLN: 250.0000 mL | INTRAVENOUS | Status: DC | PRN

## 2015-06-03 MED ORDER — AZITHROMYCIN 250 MG PO TABS: ORAL_TABLET | ORAL | Status: DC

## 2015-06-03 MED ORDER — VITAMIN D 1000 UNITS PO TABS
1000.0000 [IU] | ORAL_TABLET | Freq: Every day | ORAL | Status: DC
  Administered 2015-06-04 – 2015-06-05 (×2): 1000 [IU] via ORAL
  Filled 2015-06-03 (×2): qty 1

## 2015-06-03 NOTE — Progress Notes (Signed)
Subjective:    Patient ID: Claudia Howell, female    DOB: 1941/04/20, 74 y.o.   MRN: [id number]  HPI Chief Complaint  Patient presents with  . sick    had flu last week- fever, aches, was given tamiflu from [facility], then nausea med from Dr. Redmond School on sunday. no fever for couple days. having a cough, light headedness from coughing, wheezing   She is here with complaints of productive cough and feeling lightheaded or like her "head is in a fog" for past 6-7 days. She states she is gradually improving but continues to feel bad.  She insists she had the flu about a week and half ago and was prescribed Tamiflu.  She did complete the course.  Reports symptoms that resolved for fever, chills, severe body aches. She states 3 nights ago she could not sleep on her back due to cough and chest discomfort.   Does not smoke, unknown sick contacts. No recent antibiotic use. Reports she is eating and drinking plenty.   She has not taken any over the counter cough medications.   Denies headache, visual disturbances, chest pain, palpitations, shortness of breath, lower extremity edema, ear pain, sore throat, GI or GU symptoms. She states she no longer has body aches or pain.   Reviewed allergies, medications, past medical and social history.   Review of Systems  Pertinent positives and negatives in the history of present illness.    Objective:   Physical Exam  Constitutional: She is oriented to person, place, and time. She appears well-developed and well-nourished. No distress.  HENT:  Right Ear: Tympanic membrane and ear canal normal.  Left Ear: Tympanic membrane and ear canal normal.  Nose: Nose normal.  Mouth/Throat: Oropharynx is clear and moist and mucous membranes are normal.  Neck: Normal range of motion and full passive range of motion without pain. Neck supple.  Cardiovascular: Normal rate, regular rhythm, normal heart sounds and normal pulses.   Pulmonary/Chest: Effort normal. She has  decreased breath sounds in the left lower field. She has no wheezes. She has no rhonchi.  Lymphadenopathy:       Head (right side): No submandibular and no tonsillar adenopathy present.       Head (left side): No submandibular and no tonsillar adenopathy present.    She has no cervical adenopathy.    She has no axillary adenopathy.       Right: No supraclavicular adenopathy present.       Left: No supraclavicular adenopathy present.  Neurological: She is alert and oriented to person, place, and time. She has normal strength. No cranial nerve deficit or sensory deficit. She displays a negative Romberg sign. Gait normal.  Normal finger to nose test  Skin: Skin is warm and dry. No cyanosis. No pallor. Nails show no clubbing.  Psychiatric: She has a normal mood and affect. Her speech is normal and behavior is normal. Judgment and thought content normal. Cognition and memory are normal.   BP 142/82 mmHg  Pulse 64  Temp(Src) 97.8 F (36.6 C) (Oral)  Wt 142 lb 9.6 oz (64.683 kg)  SpO2 99%   orthostatic vitals: she is not postural     Assessment & Plan:  Productive cough - Plan: CBC with Differential/Platelet, DG Chest 2 View, azithromycin (ZITHROMAX) 250 MG tablet, CANCELED: Comprehensive metabolic panel  Lightheadedness - Plan: Orthostatic vital signs, CBC with Differential/Platelet, Comprehensive metabolic panel, CBC with Differential/Platelet, CANCELED: Comprehensive metabolic panel  Discussed that her lightheadedness is most likely  related to her cough and her illness in general. She is not orthostatic and her neuro exam is normal. She is not having any cardiac symptoms. Will rule out pneumonia. Will follow up pending labs and chest XR. Antibiotic and Tessalon prescriptions sent to pharmacy.  Recommendations given to change positions slowly and to stay well hydrated. She will let us know if not improving or if symptoms worsen.

## 2015-06-03 NOTE — ED Notes (Addendum)
Pt recently had flu, went to pcp due to cough and sob. Had xray done and sent here for chf vs pneumonia and anemia.  Denies swelling. Airway intact at triage .

## 2015-06-03 NOTE — Telephone Encounter (Signed)
Spoke with patient regarding abnormal chest XR and lab results. Recommend that she go to [facility] ED for further evaluation for possible mild CHF. Patient agreed to go and triage nurse at [facility] ED was called and given report. Dr Redmond School was consulted and agreed with plan of care.

## 2015-06-03 NOTE — H&P (Signed)
CARDIOLOGY ADMISSION NOTE  Patient ID: Claudia Howell MRN: [id number] DOB/AGE: 72/07/1940 72 y.o.  Admit date: 06/03/2015 Primary Physician   Vikki Ports, MD Primary Cardiologist   Dr. Tamala Julian Chief Complaint    Dyspnea  HPI:  The patient has a history of CABG and stents.  She has not had heart failure in the past.   She reports that she had flulike symptoms about a week ago.. Body aches. She had URI symptoms. He was very dizzy. She couldn't sleep flat for a few days later. She had to sleep in the chair. She did not have weight gain. She's had no chest discomfort, neck or arm discomfort. She's had no palpitations, presyncope or syncope. She did have some vomiting as part of her flulike illness. She has had a sensation of her head being very cloudy. She even had difficulty ambulating and knocked over some lamps.   She was seen in the [facility] clinic today with these complaints.  She was given Z pac and Tessalon.   CXR suggested pulmonary edema.  She came to the ED tonight.  Repeat chest CXR confirmed pulmonary edema.   BNP WAS 2098.    Cath 12/8 100% SVG occlusion to RCA, 95% SVG.  The stenosis to OM.  LIMA to LAD patent but diffuse disease in LAD after graft.  Overlapping stents mid/distal SVG to OM.  Her EF on that cath was 55%.      Past Medical History  Diagnosis Date  . Hypertension     Dr.Henry Tamala Julian  . Hyperlipidemia   . Osteopenia 9/09  . Adenomatous colon polyp 10/09  . Chest pain on exertion     "escalating over last few months" (10/16/2012)  . Coronary artery disease   . PAF (paroxysmal atrial fibrillation) ([place])     during cath/notes 10/16/2012  . Decubitus ulcer of coccyx 10/22/12    1/2 inch raw open area on coccyx  . S/P CABG x 3 10/22/2012    LIMA to LAD, SVG to distal RCA, SVG to OM, EVH from right thigh  . S/P Maze operation for atrial fibrillation 10/22/2012    Left side lesion set using bipolar radiofrequency and cryothermy ablation with clipping of LA appendage    Past  Surgical History  Procedure Laterality Date  . Total abdominal hysterectomy w/ bilateral salpingoophorectomy  1990    benign growth  . Tonsillectomy  1949  . Abdominal hysterectomy    . Breast cyst excision Left 1980-1990's    x 3  . Maze N/A 10/22/2012    Procedure: MAZE;  Surgeon: Rexene Alberts, MD;  Location: [place];  Service: Open Heart Surgery;  Laterality: N/A;  . Intraoperative transesophageal echocardiogram N/A 10/22/2012    Procedure: INTRAOPERATIVE TRANSESOPHAGEAL ECHOCARDIOGRAM;  Surgeon: Rexene Alberts, MD;  Location: [place];  Service: Open Heart Surgery;  Laterality: N/A;  . Cardiac catheterization  10/16/2012  . Coronary angioplasty with stent placement  04/01/2013    "1" (06/01/2013)  . Coronary artery bypass graft N/A 10/22/2012    Procedure: CORONARY ARTERY BYPASS GRAFTING (CABG);  Surgeon: Rexene Alberts, MD;  Location: [place];  Service: Open Heart Surgery;  Laterality: N/A;  . Dilation and curettage of uterus    . Left heart catheterization with coronary angiogram N/A 10/16/2012    Procedure: LEFT HEART CATHETERIZATION WITH CORONARY ANGIOGRAM;  Surgeon: Sinclair Grooms, MD;  Location: [facility] CATH LAB;  Service: Cardiovascular;  Laterality: N/A;  . Left heart catheterization with coronary/graft  angiogram N/A 06/01/2013    Procedure: LEFT HEART CATHETERIZATION WITH Beatrix Fetters;  Surgeon: Sinclair Grooms, MD;  Location: [facility] CATH LAB;  Service: Cardiovascular;  Laterality: N/A;  . Percutaneous coronary stent intervention (pci-s)  06/01/2013    Procedure: PERCUTANEOUS CORONARY STENT INTERVENTION (PCI-S);  Surgeon: Sinclair Grooms, MD;  Location: [facility] CATH LAB;  Service: Cardiovascular;;    Allergies  Allergen Reactions  . Fish Allergy Other (See Comments)    Makes me feel "yuckie "  . Oxycodone Nausea And Vomiting    Extreme nausea and vomiting   No current facility-administered medications on file prior to encounter.   Current Outpatient Prescriptions on File  Prior to Encounter  Medication Sig Dispense Refill  . atenolol (TENORMIN) 100 MG tablet TAKE 1 TABLET (100 MG TOTAL) BY MOUTH DAILY. 90 tablet 1  . atorvastatin (LIPITOR) 10 MG tablet TAKE 1 TABLET BY MOUTH EVERY DAY 90 tablet 0  . azithromycin (ZITHROMAX) 250 MG tablet Take 2 tablets on day 1, then 1 tablet on days 2 through 5. 6 tablet 0  . benazepril (LOTENSIN) 40 MG tablet TAKE 1 TABLET (40 MG TOTAL) BY MOUTH DAILY. 90 tablet 0  . cholecalciferol (VITAMIN D) 1000 UNITS tablet Take 1,000 Units by mouth daily.    . Glucosamine-Chondroitin-Vit D3 1500-1200-800 MG-MG-UNIT PACK Take 1,200 capsules by mouth daily. Joint pain    . hydrochlorothiazide (MICROZIDE) 12.5 MG capsule TAKE 1 CAPSULE (12.5 MG TOTAL) BY MOUTH DAILY. 90 capsule 1  . Multiple Vitamin (MULTIVITAMIN) tablet Take 1 tablet by mouth daily.    Marland Kitchen aspirin EC 81 MG tablet Take 1 tablet (81 mg total) by mouth daily.    . benzonatate (TESSALON) 200 MG capsule Take 1 capsule (200 mg total) by mouth 2 (two) times daily as needed for cough. 20 capsule 0   Social History   Social History  . Marital Status: Widowed    Spouse Name: N/A  . Number of Children: 2  . Years of Education: N/A   Occupational History  . retired    Social History Main Topics  . Smoking status: Never Smoker   . Smokeless tobacco: Never Used  . Alcohol Use: 0.6 oz/week    1 Glasses of wine per week     Comment: 1 glass of wine weekly (when playing cards)  . Drug Use: No  . Sexual Activity: No   Other Topics Concern  . 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  Retired Corporate treasurer.  1 son in [place], 1 son in [place].    Family History  Problem Relation Age of Onset  . Hypertension Mother   . Heart attack Mother   . Heart disease Mother   . Hypertension Brother   . Cancer Brother     prostate  . Diabetes Neg Hx   . Breast cancer Neg Hx   . Colon cancer Neg Hx   . Heart disease Other     x2 (age 44 and 62)     ROS:  Constipation.   Otherwise as stated in the HPI and negative for all other systems.  Physical Exam: Blood pressure 167/68, pulse 65, temperature 98.7 F (37.1 C), temperature source Oral, resp. rate 21, height 5\' 6"  (1.676 m), weight 140 lb 11.2 oz (63.821 kg), SpO2 97 %.  GENERAL:  Well appearing HEENT:  Pupils equal round and reactive, fundi not visualized, oral mucosa unremarkable, sores on the lips NECK:  No  jugular venous distention, positive HJR waveform within normal limits, carotid upstroke brisk and symmetric, right bruits, no thyromegaly LYMPHATICS:  No cervical, inguinal adenopathy LUNGS:  Clear to auscultation bilaterally BACK:  No CVA tenderness CHEST:  Unremarkable HEART:  PMI not displaced or sustained,S1 and S2 within normal limits, no S3, no S4, no clicks, no rubs, no murmurs ABD:  Flat, positive bowel sounds normal in frequency in pitch, no bruits, no rebound, no guarding, no midline pulsatile mass, no hepatomegaly, no splenomegaly EXT:  2 plus pulses throughout, no edema, no cyanosis no clubbing SKIN:  No rashes no nodules NEURO:  Cranial nerves II through XII grossly intact, motor grossly intact throughout PSYCH:  Cognitively intact, oriented to person place and time   Labs: Lab Results  Component Value Date   BUN 16 06/03/2015   Lab Results  Component Value Date   CREATININE 1.02* 06/03/2015   Lab Results  Component Value Date   NA 133* 06/03/2015   K 3.6 06/03/2015   CL 95* 06/03/2015   CO2 27 06/03/2015   No results found for: TROPONINI Lab Results  Component Value Date   WBC 12.0* 06/03/2015   HGB 10.8* 06/03/2015   HCT 32.3* 06/03/2015   MCV 92.0 06/03/2015   PLT 245 06/03/2015   Lab Results  Component Value Date   CHOL 140 07/07/2014   HDL 46 07/07/2014   LDLCALC 68 07/07/2014   TRIG 129 07/07/2014   CHOLHDL 3.0 07/07/2014   Lab Results  Component Value Date   ALT 36* 06/03/2015   AST 28 06/03/2015   ALKPHOS 133* 06/03/2015   BILITOT 0.6 06/03/2015        Radiology:  CXR:  Worsening interstitial pulmonary edema since the examination earlier today 1200 hr, indicating CHF. Stable small bilateral pleural effusions.  EKG:  Sinus rhythm, rate 72, axis within normal limits, intervals within normal limits, old anteroseptal infarct, no acute ST-T wave changes.  06/03/2015   ASSESSMENT AND PLAN:    PULMONARY EDEMA:  The patient has new onset pulmonary edema. She'll get an echocardiogram. I will cycle cardiac enzymes. I'll start IV Lasix. Further evaluation will be based on the results of the echo.  HTN:  Blood pressure is elevated. For now I will continue medications. Despite the acute heart failure think she'll tolerate the beta blocker.    CAD:  She is not having active chest pain. I will cycle enzymes. Given her symptoms I would have a low threshold for ischemia workup.  BRUIT:  She does have a right sided bruit with mild right stenosis noted in 2015.  This could be followed as an out patient.   SignedMinus Breeding 06/03/2015, 9:09 PM

## 2015-06-03 NOTE — ED Provider Notes (Signed)
CSN: [id number]     Arrival date & time 06/03/15  1702 History   First MD Initiated Contact with Patient 06/03/15 1833     Chief Complaint  Patient presents with  . Shortness of Breath   HPI Pt was seen at her doctors office today.  She asked me to review their note:  Per NP Raenette Rover  "She is here with complaints of productive cough and feeling lightheaded or like her "head is in a fog" for past 6-7 days. She states she is gradually improving but continues to feel bad. She insists she had the flu about a week and half ago and was prescribed Tamiflu. She did complete the course. Reports symptoms that resolved for fever, chills, severe body aches. She states 3 nights ago she could not sleep on her back due to cough and chest discomfort.  Does not smoke, unknown sick contacts. No recent antibiotic use. Reports she is eating and drinking plenty.  She has not taken any over the counter cough medications.  Denies headache, visual disturbances, chest pain, palpitations, shortness of breath, lower extremity edema, ear pain, sore throat, GI or GU symptoms. She states she no longer has body aches or pain."  Pt had outpatient lab tests performed.  She was told to come to the ED based on the test results. Past Medical History  Diagnosis Date  . Hypertension     Dr.Henry Tamala Julian  . Hyperlipidemia   . Osteopenia 9/09  . Adenomatous colon polyp 10/09  . Chest pain on exertion     "escalating over last few months" (10/16/2012)  . Coronary artery disease   . PAF (paroxysmal atrial fibrillation) ([place])     during cath/notes 10/16/2012  . Decubitus ulcer of coccyx 10/22/12    1/2 inch raw open area on coccyx  . S/P CABG x 3 10/22/2012    LIMA to LAD, SVG to distal RCA, SVG to OM, EVH from right thigh  . S/P Maze operation for atrial fibrillation 10/22/2012    Left side lesion set using bipolar radiofrequency and cryothermy ablation with clipping of LA appendage   Past Surgical History  Procedure  Laterality Date  . Total abdominal hysterectomy w/ bilateral salpingoophorectomy  1990    benign growth  . Tonsillectomy  1949  . Abdominal hysterectomy    . Breast cyst excision Left 1980-1990's    x 3  . Maze N/A 10/22/2012    Procedure: MAZE;  Surgeon: Rexene Alberts, MD;  Location: [place];  Service: Open Heart Surgery;  Laterality: N/A;  . Intraoperative transesophageal echocardiogram N/A 10/22/2012    Procedure: INTRAOPERATIVE TRANSESOPHAGEAL ECHOCARDIOGRAM;  Surgeon: Rexene Alberts, MD;  Location: [place];  Service: Open Heart Surgery;  Laterality: N/A;  . Cardiac catheterization  10/16/2012  . Coronary angioplasty with stent placement  04/01/2013    "1" (06/01/2013)  . Coronary artery bypass graft N/A 10/22/2012    Procedure: CORONARY ARTERY BYPASS GRAFTING (CABG);  Surgeon: Rexene Alberts, MD;  Location: [place];  Service: Open Heart Surgery;  Laterality: N/A;  . Dilation and curettage of uterus    . Left heart catheterization with coronary angiogram N/A 10/16/2012    Procedure: LEFT HEART CATHETERIZATION WITH CORONARY ANGIOGRAM;  Surgeon: Sinclair Grooms, MD;  Location: [facility] CATH LAB;  Service: Cardiovascular;  Laterality: N/A;  . Left heart catheterization with coronary/graft angiogram N/A 06/01/2013    Procedure: LEFT HEART CATHETERIZATION WITH Beatrix Fetters;  Surgeon: Sinclair Grooms, MD;  Location:  [place] CATH LAB;  Service: Cardiovascular;  Laterality: N/A;  . Percutaneous coronary stent intervention (pci-s)  06/01/2013    Procedure: PERCUTANEOUS CORONARY STENT INTERVENTION (PCI-S);  Surgeon: Sinclair Grooms, MD;  Location: [facility] CATH LAB;  Service: Cardiovascular;;   Family History  Problem Relation Age of Onset  . Hypertension Mother   . Heart attack Mother   . Heart disease Mother   . Hypertension Brother   . Cancer Brother     prostate  . Diabetes Neg Hx   . Breast cancer Neg Hx   . Colon cancer Neg Hx   . Heart disease Other     x2 (age 42 and 3)   Social  History  Substance Use Topics  . Smoking status: Never Smoker   . Smokeless tobacco: Never Used  . Alcohol Use: 0.6 oz/week    1 Glasses of wine per week     Comment: 1 glass of wine weekly (when playing cards)   OB History    Gravida Para Term Preterm AB TAB SAB Ectopic Multiple Living   3 2   1  1   2      Review of Systems    Allergies  Fish allergy and Oxycodone  Home Medications   Prior to Admission medications   Medication Sig Start Date End Date Taking? Authorizing Provider  atenolol (TENORMIN) 100 MG tablet TAKE 1 TABLET (100 MG TOTAL) BY MOUTH DAILY. 12/17/14  Yes Belva Crome, MD  atorvastatin (LIPITOR) 10 MG tablet TAKE 1 TABLET BY MOUTH EVERY DAY 05/09/15  Yes Rita Ohara, MD  azithromycin (ZITHROMAX) 250 MG tablet Take 2 tablets on day 1, then 1 tablet on days 2 through 5. 06/03/15  Yes Vickie L Henson, NP  benazepril (LOTENSIN) 40 MG tablet TAKE 1 TABLET (40 MG TOTAL) BY MOUTH DAILY. 06/03/15  Yes Belva Crome, MD  cholecalciferol (VITAMIN D) 1000 UNITS tablet Take 1,000 Units by mouth daily.   Yes Historical Provider, MD  Glucosamine-Chondroitin-Vit D3 1500-1200-800 MG-MG-UNIT PACK Take 1,200 capsules by mouth daily. Joint pain   Yes Historical Provider, MD  hydrochlorothiazide (MICROZIDE) 12.5 MG capsule TAKE 1 CAPSULE (12.5 MG TOTAL) BY MOUTH DAILY. 08/01/14  Yes Belva Crome, MD  Multiple Vitamin (MULTIVITAMIN) tablet Take 1 tablet by mouth daily.   Yes Historical Provider, MD  ondansetron (ZOFRAN) 4 MG tablet Take 4 mg by mouth every 8 (eight) hours as needed. 05/29/15  Yes Historical Provider, MD  aspirin EC 81 MG tablet Take 1 tablet (81 mg total) by mouth daily. 05/04/13   Rexene Alberts, MD  benzonatate (TESSALON) 200 MG capsule Take 1 capsule (200 mg total) by mouth 2 (two) times daily as needed for cough. 06/03/15   Vickie L Henson, NP   BP 167/68 mmHg  Pulse 65  Temp(Src) 98.7 F (37.1 C) (Oral)  Resp 21  Ht 5\' 6"  (1.676 m)  Wt 63.821 kg  BMI 22.72 kg/m2   SpO2 97% Physical Exam  Constitutional: She appears well-developed and well-nourished. No distress.  HENT:  Head: Normocephalic and atraumatic.  Right Ear: External ear normal.  Left Ear: External ear normal.  Eyes: Conjunctivae are normal. Right eye exhibits no discharge. Left eye exhibits no discharge. No scleral icterus.  Neck: Neck supple. No tracheal deviation present.  Cardiovascular: Normal rate, regular rhythm and intact distal pulses.   Pulmonary/Chest: Effort normal. No stridor. No respiratory distress. She has no wheezes. She has rales. Tenderness: few crackles at the bases.  Abdominal: Soft. Bowel sounds are normal. She exhibits no distension. There is no tenderness. There is no rebound and no guarding.  Musculoskeletal: She exhibits no edema or tenderness.  Neurological: She is alert. She has normal strength. No cranial nerve deficit (no facial droop, extraocular movements intact, no slurred speech) or sensory deficit. She exhibits normal muscle tone. She displays no seizure activity. Coordination normal.  Skin: Skin is warm and dry. No rash noted.  Psychiatric: She has a normal mood and affect.  Nursing note and vitals reviewed.   ED Course  Procedures (including critical care time) Labs Review Labs Reviewed  BASIC METABOLIC PANEL - Abnormal; Notable for the following:    Sodium 133 (*)    Chloride 95 (*)    Glucose, Bld 108 (*)    Creatinine, Ser 1.02 (*)    Calcium 8.8 (*)    GFR calc non Af Amer 54 (*)    All other components within normal limits  CBC - Abnormal; Notable for the following:    WBC 12.0 (*)    RBC 3.51 (*)    Hemoglobin 10.8 (*)    HCT 32.3 (*)    All other components within normal limits  BRAIN NATRIURETIC PEPTIDE - Abnormal; Notable for the following:    B Natriuretic Peptide 2098.5 (*)    All other components within normal limits  I-STAT TROPOININ, ED  POC OCCULT BLOOD, ED    Imaging Review Dg Chest 2 View  06/03/2015  CLINICAL DATA:   74 year old with 1 week history of progressive worsening cough. Prior CABG and Maze procedure. EXAM: CHEST  2 VIEW COMPARISON:  Chest x-rays earlier today 1200 hr and previously. FINDINGS: Prior sternotomy for CABG and Maze. Cardiac silhouette mildly enlarged, unchanged. Moderate diffuse interstitial pulmonary edema, more so than on the prior examination earlier today 1200 hr. Small bilateral pleural effusions, unchanged since earlier today. No confluent airspace consolidation. Visualized bony thorax intact. IMPRESSION: Worsening interstitial pulmonary edema since the examination earlier today 1200 hr, indicating CHF. Stable small bilateral pleural effusions. Electronically Signed   By: Evangeline Dakin M.D.   On: 06/03/2015 17:48   Dg Chest 2 View  06/03/2015  CLINICAL DATA:  Productive cough with fever for 3 days. EXAM: CHEST  2 VIEW COMPARISON:  11/24/2012 FINDINGS: Sequelae of prior CABG and left atrial appendage clipping are again identified. Cardiac silhouette is upper limits of normal in size, unchanged. Thoracic aortic calcification is noted. The lungs are well inflated. There are mildly increased interstitial densities bilaterally, greatest at the lung bases. No segmental airspace consolidation or pneumothorax is identified. There is minimal blunting of the costophrenic angles when compared to the prior study, and trace pleural effusions are not excluded. No acute osseous abnormality is seen. IMPRESSION: Increased interstitial densities greatest at the bases which may reflect mild interstitial edema, atelectasis, or atypical infection. Electronically Signed   By: Logan Bores M.D.   On: 06/03/2015 14:37   I have personally reviewed and evaluated these images and lab results as part of my medical decision-making.   EKG Interpretation   Date/Time:  Friday June 03 2015 17:18:21 EST Ventricular Rate:  72 PR Interval:  162 QRS Duration: 94 QT Interval:  414 QTC Calculation: 453 R Axis:    97 Text Interpretation:  Normal sinus rhythm Rightward axis Anterior infarct  , age undetermined Abnormal ECG Confirmed by Chaniece Barbato  MD-J, Aliani Caccavale [id number]) on  06/03/2015 8:43:44 PM      MDM   Final diagnoses:  Acute on chronic congestive heart failure, unspecified congestive heart failure type ([place])    Patient's symptoms all started off when she had a recent flulike illness.  She was feeling somewhat better however she continued to have shortness of breath. She was seen at the doctor's office today and chest x-ray suggests the possibility of congestive heart failure. Patient's laboratory tests today reveal a worsening anemia. Her troponin is negative but she does have an elevated BNP.  She has history of coronary artery disease but denies any history of congestive heart failure. I will start her on a dose of IV Lasix. I will consult with the cardiology service    Dorie Rank, MD 06/03/15 2044

## 2015-06-03 NOTE — Patient Instructions (Addendum)
Make sure you are staying well hydrated and change positions slowly.  You are not orthostatic today, meaning your blood pressures are normal.  We will let you know your X ray and blood results.   start the antibiotic today and let us know if you're not improving in the next 2-3 days.  The antibiotic will continue working for several days.    Cough, Adult Coughing is a reflex that clears your throat and your airways. Coughing helps to heal and protect your lungs. It is normal to cough occasionally, but a cough that happens with other symptoms or lasts a long time may be a sign of a condition that needs treatment. A cough may last only 2-3 weeks (acute), or it may last longer than 8 weeks (chronic). CAUSES Coughing is commonly caused by:  Breathing in substances that irritate your lungs.  A viral or bacterial respiratory infection.  Allergies.  Asthma.  Postnasal drip.  Smoking.  Acid backing up from the stomach into the esophagus (gastroesophageal reflux).  Certain medicines.  Chronic lung problems, including COPD (or rarely, lung cancer).  Other medical conditions such as heart failure. HOME CARE INSTRUCTIONS  Pay attention to any changes in your symptoms. Take these actions to help with your discomfort:  Take medicines only as told by your health care provider.  If you were prescribed an antibiotic medicine, take it as told by your health care provider. Do not stop taking the antibiotic even if you start to feel better.  Talk with your health care provider before you take a cough suppressant medicine.  Drink enough fluid to keep your urine clear or pale yellow.  If the air is dry, use a cold steam vaporizer or humidifier in your bedroom or your home to help loosen secretions.  Avoid anything that causes you to cough at work or at home.  If your cough is worse at night, try sleeping in a semi-upright position.  Avoid cigarette smoke. If you smoke, quit smoking. If you  need help quitting, ask your health care provider.  Avoid caffeine.  Avoid alcohol.  Rest as needed. SEEK MEDICAL CARE IF:   You have new symptoms.  You cough up pus.  Your cough does not get better after 2-3 weeks, or your cough gets worse.  You cannot control your cough with suppressant medicines and you are losing sleep.  You develop pain that is getting worse or pain that is not controlled with pain medicines.  You have a fever.  You have unexplained weight loss.  You have night sweats. SEEK IMMEDIATE MEDICAL CARE IF:  You cough up blood.  You have difficulty breathing.  Your heartbeat is very fast.   This information is not intended to replace advice given to you by your health care provider. Make sure you discuss any questions you have with your health care provider.   Document Released: 12/08/2010 Document Revised: 03/02/2015 Document Reviewed: 08/18/2014 Elsevier Interactive Patient Education [employer].

## 2015-06-04 ENCOUNTER — Inpatient Hospital Stay (HOSPITAL_COMMUNITY): Payer: Medicare Other

## 2015-06-04 DIAGNOSIS — I509 Heart failure, unspecified: Secondary | ICD-10-CM

## 2015-06-04 LAB — BASIC METABOLIC PANEL
Anion gap: 12 (ref 5–15)
BUN: 20 mg/dL (ref 6–20)
CO2: 30 mmol/L (ref 22–32)
Calcium: 8.9 mg/dL (ref 8.9–10.3)
Chloride: 92 mmol/L — ABNORMAL LOW (ref 101–111)
Creatinine, Ser: 1.09 mg/dL — ABNORMAL HIGH (ref 0.44–1.00)
GFR calc Af Amer: 57 mL/min — ABNORMAL LOW (ref >60)
GFR calc non Af Amer: 49 mL/min — ABNORMAL LOW (ref >60)
Glucose, Bld: 104 mg/dL — ABNORMAL HIGH (ref 65–99)
Potassium: 3.5 mmol/L (ref 3.5–5.1)
Sodium: 134 mmol/L — ABNORMAL LOW (ref 135–145)

## 2015-06-04 LAB — TROPONIN I
Troponin I: 0.05 ng/mL — ABNORMAL HIGH (ref <0.031)
Troponin I: 0.03 ng/mL (ref <0.031)
Troponin I: <0.03 ng/mL (ref <0.031)

## 2015-06-04 LAB — MRSA PCR SCREENING: MRSA by PCR: NEGATIVE

## 2015-06-04 LAB — TSH: TSH: 2.133 u[IU]/mL (ref 0.350–4.500)

## 2015-06-04 MED ORDER — FUROSEMIDE 10 MG/ML IJ SOLN
20.0000 mg | Freq: Once | INTRAMUSCULAR | Status: AC
  Administered 2015-06-04: 20 mg via INTRAVENOUS
  Filled 2015-06-04: qty 2

## 2015-06-04 MED ORDER — POLYETHYLENE GLYCOL 3350 17 G PO PACK
17.0000 g | PACK | Freq: Every day | ORAL | Status: DC
  Administered 2015-06-04 – 2015-06-05 (×2): 17 g via ORAL
  Filled 2015-06-04 (×2): qty 1

## 2015-06-04 NOTE — Progress Notes (Signed)
Limiting fluid intake was discussed with the patient.

## 2015-06-04 NOTE — Progress Notes (Signed)
The patient's troponin level was positive at 0.05.  She does not have active chest pain.  Dr. Percival Spanish was notified.

## 2015-06-04 NOTE — Progress Notes (Signed)
Patient ID: Sondra Barges, female   DOB: [birth year], 74 y.o.   MRN: [id number]     Subjective:    SOB is improving, her main complaint was orthopnea which has also improved  Objective:   Temp:  [97.8 F (36.6 C)-98.7 F (37.1 C)] 97.8 F (36.6 C) (12/10 0418) Pulse Rate:  [65-77] 72 (12/10 0900) Resp:  [16-21] 18 (12/10 0900) BP: (152-199)/(62-77) 154/64 mmHg (12/10 0900) SpO2:  [94 %-98 %] 97 % (12/10 0900) Weight:  [133 lb 4.8 oz (60.464 kg)-140 lb 11.2 oz (63.821 kg)] 133 lb 4.8 oz (60.464 kg) (12/10 0418) Last BM Date: 06/02/15  Filed Weights   06/03/15 1717 06/03/15 2323 06/04/15 0418  Weight: 140 lb 11.2 oz (63.821 kg) 133 lb 14.4 oz (60.737 kg) 133 lb 4.8 oz (60.464 kg)    Intake/Output Summary (Last 24 hours) at 06/04/15 1136 Last data filed at 06/04/15 1100  Gross per 24 hour  Intake    600 ml  Output   3550 ml  Net  -2950 ml     Exam:  General: NAD  Resp: mild crackles bilateral bases  Cardiac: RRR, no m/r/g, no jvd  GI: abdomen soft, NT, ND  MSK: no LE edema  Neuro: no focal deficits  Psych: appropriate affect  Lab Results:  Basic Metabolic Panel:  Recent Labs Lab 06/03/15 0001 06/03/15 1749 06/04/15 0448  NA 134* 133* 134*  K 3.6 3.6 3.5  CL 96* 95* 92*  CO2 26 27 30   GLUCOSE 94 108* 104*  BUN 14 16 20   CREATININE 0.94* 1.02* 1.09*  CALCIUM 8.5* 8.8* 8.9    Liver Function Tests:  Recent Labs Lab 06/03/15 0001  AST 28  ALT 36*  ALKPHOS 133*  BILITOT 0.6  PROT 6.4  ALBUMIN 3.1*    CBC:  Recent Labs Lab 06/03/15 0001 06/03/15 1749  WBC 13.5* 12.0*  HGB 10.3* 10.8*  HCT 30.8* 32.3*  MCV 93.1 92.0  PLT 247 245    Cardiac Enzymes:  Recent Labs Lab 06/04/15 0013 06/04/15 0448  TROPONINI 0.05* 0.03    BNP: No results for input(s): PROBNP in the last 8760 hours.  Coagulation: No results for input(s): INR in the last 168 hours.  ECG:   Medications:   Scheduled Medications: . aspirin EC  81 mg Oral  Daily  . atenolol  100 mg Oral Daily  . atorvastatin  10 mg Oral Daily  . benazepril  40 mg Oral Daily  . cholecalciferol  1,000 Units Oral Daily  . heparin  5,000 Units Subcutaneous 3 times per day  . potassium chloride  40 mEq Oral BID  . sodium chloride  3 mL Intravenous Q12H     Infusions:     PRN Medications:  sodium chloride, acetaminophen, ondansetron (ZOFRAN) IV, sodium chloride     Assessment/Plan    1. Pulmonary edema - admitted with new onset pulmonary edema. CXR with signficant edema, BNP 2098. Recent viral illness - echo is pending. Despite her cardiac history I do not see a prior study in our system.  - started on IV lasix , received on dose of 40mg  IV yesterday. Negative 3 liters since admit, mild uptrend in Cr and BUN. Will dose lasix 20mg  IV once today.  - mild trop to 0.05 in setting of CHF, nonspecific finding at this time that trended back down to normal. - f/u echo results, depending on etiology of CHF may need changes is medication regimen and further testing.  - admit  weight 142 lbs, earlier this year she was around 130.     2. CAD - history of CABG 09/2012, had PCI of SVG-LCX in Dec 2014.    Carlyle Dolly, M.D.

## 2015-06-04 NOTE — Progress Notes (Signed)
  Echocardiogram 2D Echocardiogram has been performed.  Jennette Dubin 06/04/2015, 3:47 PM

## 2015-06-05 DIAGNOSIS — J810 Acute pulmonary edema: Secondary | ICD-10-CM

## 2015-06-05 DIAGNOSIS — I5031 Acute diastolic (congestive) heart failure: Secondary | ICD-10-CM

## 2015-06-05 DIAGNOSIS — J811 Chronic pulmonary edema: Secondary | ICD-10-CM

## 2015-06-05 LAB — BASIC METABOLIC PANEL
Anion gap: 8 (ref 5–15)
BUN: 24 mg/dL — ABNORMAL HIGH (ref 6–20)
CO2: 31 mmol/L (ref 22–32)
Calcium: 8.8 mg/dL — ABNORMAL LOW (ref 8.9–10.3)
Chloride: 96 mmol/L — ABNORMAL LOW (ref 101–111)
Creatinine, Ser: 1.18 mg/dL — ABNORMAL HIGH (ref 0.44–1.00)
GFR calc Af Amer: 52 mL/min — ABNORMAL LOW (ref >60)
GFR calc non Af Amer: 45 mL/min — ABNORMAL LOW (ref >60)
Glucose, Bld: 111 mg/dL — ABNORMAL HIGH (ref 65–99)
Potassium: 5 mmol/L (ref 3.5–5.1)
Sodium: 135 mmol/L (ref 135–145)

## 2015-06-05 MED ORDER — FUROSEMIDE 20 MG PO TABS: 20.0000 mg | ORAL_TABLET | Freq: Every day | ORAL | Status: DC | PRN

## 2015-06-05 MED ORDER — CARVEDILOL 12.5 MG PO TABS: 12.5000 mg | ORAL_TABLET | Freq: Two times a day (BID) | ORAL | Status: DC

## 2015-06-05 MED ORDER — BISACODYL 10 MG RE SUPP
10.0000 mg | Freq: Every day | RECTAL | Status: DC | PRN
  Administered 2015-06-05: 10 mg via RECTAL
  Filled 2015-06-05 (×2): qty 1

## 2015-06-05 MED ORDER — HYDROCHLOROTHIAZIDE 25 MG PO TABS
25.0000 mg | ORAL_TABLET | Freq: Every day | ORAL | Status: DC
  Administered 2015-06-05: 25 mg via ORAL
  Filled 2015-06-05: qty 1

## 2015-06-05 MED ORDER — HYDROCHLOROTHIAZIDE 25 MG PO TABS: 25.0000 mg | ORAL_TABLET | Freq: Every day | ORAL | Status: DC

## 2015-06-05 NOTE — Progress Notes (Deleted)
Physician Discharge Summary   Cardiologist:  Tamala Julian  Patient ID: Claudia Howell MRN: [id number] DOB/AGE: 72-Apr-1942 72 y.o.  Admit date: 06/03/2015 Discharge date: 06/05/2015  Admission Diagnoses:  Acute on chronic systolic heart failure  Discharge Diagnoses:  Active Problems:   Essential hypertension, benign   Hyperlipidemia   Acute on chronic combined systolic and diastolic heart failure ([place])   Pulmonary edema   coronary artery disease    Discharged Condition: stable  Hospital Course:   The patient has a history of CABG and stents. Cath 12/8 100% SVG occlusion to RCA, 95% SVG. The stenosis to OM. LIMA to LAD patent but diffuse disease in LAD after graft. Overlapping stents mid/distal SVG to OM. Her EF on that cath was 55%.  She has not had heart failure in the past. She reports that she had flulike symptoms about a week ago.. Body aches. She had URI symptoms. He was very dizzy. She couldn't sleep flat for a few days later. She had to sleep in the chair. She did not have weight gain. She's had no chest discomfort, neck or arm discomfort. She's had no palpitations, presyncope or syncope. She did have some vomiting as part of her flulike illness. She has had a sensation of her head being very cloudy. She even had difficulty ambulating and knocked over some lamps.   She was seen in the [facility] clinic today with these complaints. She was given Z pac and Tessalon. CXR suggested pulmonary edema. Repeat chest CXR confirmed pulmonary edema. BNP WAS 2098.    She was admitted for IV diuresis.  She was continued on home medications for blood pressure but I discharged her HCTZ was increased to 25 mg.  Upon was mildly elevated at 0.05.  Ultimately diuresed 4.3 L.  2-D echocardiogram completed on 06/04/2015 revealed an ejection fraction of 45-50% with no regional wall motion abnormalities. Left atrium was mildly dilated with peak PA pressure of 32 mmHg.  High E/e' consistent with LA pressures  consistent with diastolic dysfunction. She'll be discharged on 20 mg of Lasix when necessary for weight gain and edema or shortness of breath. She was educated on low sodium diet and daily weight monitoring. Atenolol was changed to Coreg 12.5 mg twice daily  She will need a basic metabolic panel and magnesium at next office visit which is December 20 with Dr. Tamala Julian   Consults: None  Significant Diagnostic Studies:  CHEST 2 VIEW  COMPARISON: 11/24/2012  FINDINGS: Sequelae of prior CABG and left atrial appendage clipping are again identified. Cardiac silhouette is upper limits of normal in size, unchanged. Thoracic aortic calcification is noted. The lungs are well inflated. There are mildly increased interstitial densities bilaterally, greatest at the lung bases. No segmental airspace consolidation or pneumothorax is identified. There is minimal blunting of the costophrenic angles when compared to the prior study, and trace pleural effusions are not excluded. No acute osseous abnormality is seen.  IMPRESSION: Increased interstitial densities greatest at the bases which may reflect mild interstitial edema, atelectasis, or atypical infection.  Treatments: See above  Discharge Exam: Blood pressure 162/75, pulse 80, temperature 98 F (36.7 C), temperature source Oral, resp. rate 18, height 5\' 6"  (1.676 m), weight 133 lb 9.6 oz (60.6 kg), SpO2 99 %.   Disposition: 01-Home or Self Care      Discharge Instructions    Diet - low sodium heart healthy    Complete by:  As directed      Discharge instructions  Complete by:  As directed   Monitor your weight every morning.  If you gain 3 pounds in 24 hours, or 5 pounds in a week, call the office for instructions.            Medication List    STOP taking these medications        atenolol 100 MG tablet  Commonly known as:  TENORMIN     azithromycin 250 MG tablet  Commonly known as:  ZITHROMAX     benzonatate 200 MG  capsule  Commonly known as:  TESSALON     hydrochlorothiazide 12.5 MG capsule  Commonly known as:  MICROZIDE  Replaced by:  hydrochlorothiazide 25 MG tablet      TAKE these medications        aspirin EC 81 MG tablet  Take 1 tablet (81 mg total) by mouth daily.     atorvastatin 10 MG tablet  Commonly known as:  LIPITOR  TAKE 1 TABLET BY MOUTH EVERY DAY     benazepril 40 MG tablet  Commonly known as:  LOTENSIN  TAKE 1 TABLET (40 MG TOTAL) BY MOUTH DAILY.     carvedilol 12.5 MG tablet  Commonly known as:  COREG  Take 1 tablet (12.5 mg total) by mouth 2 (two) times daily with a meal.     cholecalciferol 1000 UNITS tablet  Commonly known as:  VITAMIN D  Take 1,000 Units by mouth daily.     furosemide 20 MG tablet  Commonly known as:  LASIX  Take 1 tablet (20 mg total) by mouth daily as needed for fluid or edema (For weight gain of 3 pounds in 24 hours or 5 pounds in a week).     Glucosamine-Chondroitin-Vit D3 1500-1200-800 MG-MG-UNIT Pack  Take 1,200 capsules by mouth daily. Joint pain     hydrochlorothiazide 25 MG tablet  Commonly known as:  HYDRODIURIL  Take 1 tablet (25 mg total) by mouth daily.     multivitamin tablet  Take 1 tablet by mouth daily.     ondansetron 4 MG tablet  Commonly known as:  ZOFRAN  Take 4 mg by mouth every 8 (eight) hours as needed.       Follow-up Information    Follow up with Sinclair Grooms, MD On 06/14/2015.   Specialty:  Cardiology   Why:  3 PM   Contact information:   [id number] [place] [address] [phone]      Greater than 30 minutes was spent completing the patient's discharge.    SignedTarri Fuller, PA-C 06/05/2015, 10:12 AM

## 2015-06-05 NOTE — Progress Notes (Signed)
The pt stated that she was uncomfortable d/t constipation and was requesting a suppository. She had Miralax and prune juice during the day.  Dr. Marlowe Sax was notified and a new order was given to give the pt a Dulcolax suppository.

## 2015-06-05 NOTE — Care Management Note (Signed)
Case Management Note  Patient Details  Name: Claudia Howell MRN: [id number] Date of Birth: Oct 27, 1940  Subjective/Objective:                  Admission Diagnoses: Acute on chronic systolic heart failure PMH: Essential hypertension, Hyperlipidemia, Pulmonary edema, coronary artery disease  Action/Plan: CM spoke to patient at the bedside to speak about importance of home mgmt of HF and daily weights. CM spoke to patient about [facility] possibility for medical mgmt and patient declined stating she did not feel it was necessary. Patient said that she lives alone but is independent and has no further needs for [facility] or discharge planning. Patient states that she has no difficulty obtaining her medication. Patient said that her daughter or neighbor are her support persons and will be able to pick her up for discharge.   Expected Discharge Date:  06/05/15              Expected Discharge Plan:  Home/Self Care  In-House Referral:     Discharge planning Services  CM Consult  Post Acute Care Choice:    Choice offered to:     DME Arranged:    DME Agency:     HH Arranged:    HH Agency:     Status of Service:  Completed, signed off  Medicare Important Message Given:    Date Medicare IM Given:    Medicare IM give by:    Date Additional Medicare IM Given:    Additional Medicare Important Message give by:     If discussed at [place] of Stay Meetings, dates discussed:    Additional Comments:  Guido Sander, RN 06/05/2015, 11:00 AM

## 2015-06-05 NOTE — Progress Notes (Signed)
Patient ID: Claudia Howell, female   DOB: 14-Mar-1941, 74 y.o.   MRN: [id number]     Subjective:    SOB resolved  Objective:   Temp:  [98 F (36.7 C)-99 F (37.2 C)] 98 F (36.7 C) (12/11 0330) Pulse Rate:  [71-80] 80 (12/11 0330) Resp:  [18-20] 18 (12/10 1937) BP: (148-172)/(65-89) 162/75 mmHg (12/11 0330) SpO2:  [94 %-99 %] 99 % (12/11 0330) Weight:  [133 lb 9.6 oz (60.6 kg)] 133 lb 9.6 oz (60.6 kg) (12/11 0330) Last BM Date: 06/02/15  Filed Weights   06/03/15 2323 06/04/15 0418 06/05/15 0330  Weight: 133 lb 14.4 oz (60.737 kg) 133 lb 4.8 oz (60.464 kg) 133 lb 9.6 oz (60.6 kg)    Intake/Output Summary (Last 24 hours) at 06/05/15 0931 Last data filed at 06/05/15 0331  Gross per 24 hour  Intake    240 ml  Output   1875 ml  Net  -1635 ml    Telemetry: NSR  Exam:  General: NAD  Resp: CTAB  Cardiac: RRR, no m/r/g, no jvd  GI: abdomen soft, NT, ND  MSK: no LE edema  Neuro: no focal deficits  Psych: appropriate affect  Lab Results:  Basic Metabolic Panel:  Recent Labs Lab 06/03/15 1749 06/04/15 0448 06/05/15 0245  NA 133* 134* 135  K 3.6 3.5 5.0  CL 95* 92* 96*  CO2 27 30 31   GLUCOSE 108* 104* 111*  BUN 16 20 24*  CREATININE 1.02* 1.09* 1.18*  CALCIUM 8.8* 8.9 8.8*    Liver Function Tests:  Recent Labs Lab 06/03/15 0001  AST 28  ALT 36*  ALKPHOS 133*  BILITOT 0.6  PROT 6.4  ALBUMIN 3.1*    CBC:  Recent Labs Lab 06/03/15 0001 06/03/15 1749  WBC 13.5* 12.0*  HGB 10.3* 10.8*  HCT 30.8* 32.3*  MCV 93.1 92.0  PLT 247 245    Cardiac Enzymes:  Recent Labs Lab 06/04/15 0013 06/04/15 0448 06/04/15 1330  TROPONINI 0.05* 0.03 <0.03    BNP: No results for input(s): PROBNP in the last 8760 hours.  Coagulation: No results for input(s): INR in the last 168 hours.  ECG:   Medications:   Scheduled Medications: . aspirin EC  81 mg Oral Daily  . atenolol  100 mg Oral Daily  . atorvastatin  10 mg Oral Daily  . benazepril  40  mg Oral Daily  . cholecalciferol  1,000 Units Oral Daily  . heparin  5,000 Units Subcutaneous 3 times per day  . polyethylene glycol  17 g Oral Daily  . potassium chloride  40 mEq Oral BID  . sodium chloride  3 mL Intravenous Q12H     Infusions:     PRN Medications:  sodium chloride, acetaminophen, bisacodyl, ondansetron (ZOFRAN) IV, sodium chloride     Assessment/Plan     1. Pulmonary edema/Acute diastolic HF - admitted with new onset pulmonary edema. CXR with signficant edema, BNP 2098. Recent viral illness - 05/2015 echo LVEF [id number], diastolic function is not described. High E/e' consistent with LA pressures consistent with diastolic dysfunction.  - negative 1.7 liters yesterday, negative 4.3 liters since admission. She received IV lasix 40mg  x1 and 20mg  x1 only. Uptrend in Cr and BUN today, will d/c IV lasix  - will increase her home HCTZ to 25mg  daily, give her lasix 20mg  prn only for weight gain, edema, or SOB. Educated on low sodium and daily weights.  - given low normal to mildly decreased LVEF, change atenolol to  coreg 12.5mg  bid.    2. CAD - history of CABG 09/2012, had PCI of SVG-LCX in Dec 2014.   3. HTN - elevated bp's during admission.  - will increase HCTZ to 25mg  daily   She will be discharged today. She already has f/u with Dr Tamala Julian Dec 20th. Will need BMET and Mg at that time.    Carlyle Dolly, M.D

## 2015-06-06 ENCOUNTER — Telehealth: Payer: Self-pay | Admitting: Cardiology

## 2015-06-06 ENCOUNTER — Telehealth: Payer: Self-pay | Admitting: Family Medicine

## 2015-06-06 NOTE — Telephone Encounter (Signed)
D/C phone call.Marland Kitchen Appt is 06/14/15 w/ DR. Daneen Schick at the [facility]

## 2015-06-06 NOTE — Telephone Encounter (Signed)
Left a message for pt to call. Needs to follow up with Dr. Tomi Bamberger concerning recent hospital stay. She needs to be seen in about 2 weeks.

## 2015-06-06 NOTE — Telephone Encounter (Signed)
Patient contacted regarding discharge from [facility] on 06/05/15.  Patient understands to follow up with provider Dr Daneen Schick on 06/14/15 3 PM at  At [facility]. Patient understands discharge instructions? yes  Patient understands medications and regiment? yes Patient understands to bring all medications to this visit? yes

## 2015-06-06 NOTE — Discharge Summary (Deleted)
Physician Discharge Summary   Cardiologist:  Tamala Julian  Patient ID: Claudia Howell MRN: [id number] DOB/AGE: 09/04/1940 74 y.o.  Admit date: 06/03/2015 Discharge date: 06/05/2015  Admission Diagnoses:  Acute on chronic systolic heart failure  Discharge Diagnoses:  Active Problems:   Essential hypertension, benign   Hyperlipidemia   Acute on chronic combined systolic and diastolic heart failure ([place])   Pulmonary edema   coronary artery disease    Discharged Condition: stable  Hospital Course:   The patient has a history of CABG and stents. Cath 12/8 100% SVG occlusion to RCA, 95% SVG. The stenosis to OM. LIMA to LAD patent but diffuse disease in LAD after graft. Overlapping stents mid/distal SVG to OM. Her EF on that cath was 55%.  She has not had heart failure in the past. She reports that she had flulike symptoms about a week ago.. Body aches. She had URI symptoms. He was very dizzy. She couldn't sleep flat for a few days later. She had to sleep in the chair. She did not have weight gain. She's had no chest discomfort, neck or arm discomfort. She's had no palpitations, presyncope or syncope. She did have some vomiting as part of her flulike illness. She has had a sensation of her head being very cloudy. She even had difficulty ambulating and knocked over some lamps.   She was seen in the [facility] clinic today with these complaints. She was given Z pac and Tessalon. CXR suggested pulmonary edema. Repeat chest CXR confirmed pulmonary edema. BNP WAS 2098.    She was admitted for IV diuresis.  She was continued on home medications for blood pressure but I discharged her HCTZ was increased to 25 mg.  Upon was mildly elevated at 0.05.  Ultimately diuresed 4.3 L.  2-D echocardiogram completed on 06/04/2015 revealed an ejection fraction of 45-50% with no regional wall motion abnormalities. Left atrium was mildly dilated with peak PA pressure of 32 mmHg.  High E/e' consistent with LA pressures  consistent with diastolic dysfunction. She'll be discharged on 20 mg of Lasix when necessary for weight gain and edema or shortness of breath. She was educated on low sodium diet and daily weight monitoring. Atenolol was changed to Coreg 12.5 mg twice daily  She will need a basic metabolic panel and magnesium at next office visit which is December 20 with Dr. Tamala Julian   Consults: None  Significant Diagnostic Studies:  CHEST 2 VIEW  COMPARISON: 11/24/2012  FINDINGS: Sequelae of prior CABG and left atrial appendage clipping are again identified. Cardiac silhouette is upper limits of normal in size, unchanged. Thoracic aortic calcification is noted. The lungs are well inflated. There are mildly increased interstitial densities bilaterally, greatest at the lung bases. No segmental airspace consolidation or pneumothorax is identified. There is minimal blunting of the costophrenic angles when compared to the prior study, and trace pleural effusions are not excluded. No acute osseous abnormality is seen.  IMPRESSION: Increased interstitial densities greatest at the bases which may reflect mild interstitial edema, atelectasis, or atypical infection.  Treatments: See above  Discharge Exam: Blood pressure 162/75, pulse 80, temperature 98 F (36.7 C), temperature source Oral, resp. rate 18, height 5\' 6"  (1.676 m), weight 133 lb 9.6 oz (60.6 kg), SpO2 99 %.   Disposition: 01-Home or Self Care      Discharge Instructions    Diet - low sodium heart healthy    Complete by:  As directed      Discharge instructions  Complete by:  As directed   Monitor your weight every morning.  If you gain 3 pounds in 24 hours, or 5 pounds in a week, call the office for instructions.            Medication List    STOP taking these medications        atenolol 100 MG tablet  Commonly known as:  TENORMIN     azithromycin 250 MG tablet  Commonly known as:  ZITHROMAX     benzonatate 200 MG  capsule  Commonly known as:  TESSALON     hydrochlorothiazide 12.5 MG capsule  Commonly known as:  MICROZIDE  Replaced by:  hydrochlorothiazide 25 MG tablet      TAKE these medications        aspirin EC 81 MG tablet  Take 1 tablet (81 mg total) by mouth daily.     atorvastatin 10 MG tablet  Commonly known as:  LIPITOR  TAKE 1 TABLET BY MOUTH EVERY DAY     benazepril 40 MG tablet  Commonly known as:  LOTENSIN  TAKE 1 TABLET (40 MG TOTAL) BY MOUTH DAILY.     carvedilol 12.5 MG tablet  Commonly known as:  COREG  Take 1 tablet (12.5 mg total) by mouth 2 (two) times daily with a meal.     cholecalciferol 1000 UNITS tablet  Commonly known as:  VITAMIN D  Take 1,000 Units by mouth daily.     furosemide 20 MG tablet  Commonly known as:  LASIX  Take 1 tablet (20 mg total) by mouth daily as needed for fluid or edema (For weight gain of 3 pounds in 24 hours or 5 pounds in a week).     Glucosamine-Chondroitin-Vit D3 1500-1200-800 MG-MG-UNIT Pack  Take 1,200 capsules by mouth daily. Joint pain     hydrochlorothiazide 25 MG tablet  Commonly known as:  HYDRODIURIL  Take 1 tablet (25 mg total) by mouth daily.     multivitamin tablet  Take 1 tablet by mouth daily.     ondansetron 4 MG tablet  Commonly known as:  ZOFRAN  Take 4 mg by mouth every 8 (eight) hours as needed.       Follow-up Information    Follow up with Sinclair Grooms, MD On 06/14/2015.   Specialty:  Cardiology   Why:  3 PM   Contact information:   [id number] [place] [address] [phone]      Greater than 30 minutes was spent completing the patient's discharge.    SignedTarri Fuller, PA-C 06/05/2015, 10:12 AM

## 2015-06-06 NOTE — Telephone Encounter (Signed)
Pt called me back and stated that she is doing ok. She stated that she did have some medication changes but she understood what those were. She states she does have all her medications filled. She has a follow up visit with cardio on 12/20. I informed pt that Dr. Tomi Bamberger would like to see her and pt made an appt for 12/28. Pt was informed that if she needed anything to call the office. Pt was informed to bring all her meds with her to [facility]. Pt verbalized understanding.

## 2015-06-06 NOTE — Discharge Summary (Signed)
Physician Discharge Summary   Cardiologist:  Tamala Julian  Patient ID: Claudia Howell MRN: [id number] DOB/AGE: 1940-08-06 74 y.o.  Admit date: 06/03/2015 Discharge date: 06/05/2015  Admission Diagnoses:  Acute on chronic systolic heart failure  Discharge Diagnoses:  Active Problems:   Essential hypertension, benign   Hyperlipidemia   Acute on chronic combined systolic and diastolic heart failure ([place])   Pulmonary edema   coronary artery disease    Discharged Condition: stable  Hospital Course:   The patient has a history of CABG and stents. Cath 12/8 100% SVG occlusion to RCA, 95% SVG. The stenosis to OM. LIMA to LAD patent but diffuse disease in LAD after graft. Overlapping stents mid/distal SVG to OM. Her EF on that cath was 55%.  She has not had heart failure in the past. She reports that she had flulike symptoms about a week ago.. Body aches. She had URI symptoms. He was very dizzy. She couldn't sleep flat for a few days later. She had to sleep in the chair. She did not have weight gain. She's had no chest discomfort, neck or arm discomfort. She's had no palpitations, presyncope or syncope. She did have some vomiting as part of her flulike illness. She has had a sensation of her head being very cloudy. She even had difficulty ambulating and knocked over some lamps.   She was seen in the [facility] clinic today with these complaints. She was given Z pac and Tessalon. CXR suggested pulmonary edema. Repeat chest CXR confirmed pulmonary edema. BNP WAS 2098.    She was admitted for IV diuresis.  She was continued on home medications for blood pressure but I discharged her HCTZ was increased to 25 mg.  Upon was mildly elevated at 0.05.  Ultimately diuresed 4.3 L.  2-D echocardiogram completed on 06/04/2015 revealed an ejection fraction of 45-50% with no regional wall motion abnormalities. Left atrium was mildly dilated with peak PA pressure of 32 mmHg.  High E/e' consistent with LA pressures  consistent with diastolic dysfunction. She'll be discharged on 20 mg of Lasix when necessary for weight gain and edema or shortness of breath. She was educated on low sodium diet and daily weight monitoring. Atenolol was changed to Coreg 12.5 mg twice daily  She will need a basic metabolic panel and magnesium at next office visit which is December 20 with Dr. Tamala Julian   Consults: None  Significant Diagnostic Studies:  CHEST 2 VIEW  COMPARISON: 11/24/2012  FINDINGS: Sequelae of prior CABG and left atrial appendage clipping are again identified. Cardiac silhouette is upper limits of normal in size, unchanged. Thoracic aortic calcification is noted. The lungs are well inflated. There are mildly increased interstitial densities bilaterally, greatest at the lung bases. No segmental airspace consolidation or pneumothorax is identified. There is minimal blunting of the costophrenic angles when compared to the prior study, and trace pleural effusions are not excluded. No acute osseous abnormality is seen.  IMPRESSION: Increased interstitial densities greatest at the bases which may reflect mild interstitial edema, atelectasis, or atypical infection.  Treatments: See above  Discharge Exam: Blood pressure 162/75, pulse 80, temperature 98 F (36.7 C), temperature source Oral, resp. rate 18, height 5\' 6"  (1.676 m), weight 133 lb 9.6 oz (60.6 kg), SpO2 99 %.   Disposition: 01-Home or Self Care      Discharge Instructions    Diet - low sodium heart healthy    Complete by:  As directed      Discharge instructions  Complete by:  As directed   Monitor your weight every morning.  If you gain 3 pounds in 24 hours, or 5 pounds in a week, call the office for instructions.            Medication List    STOP taking these medications        atenolol 100 MG tablet  Commonly known as:  TENORMIN     azithromycin 250 MG tablet  Commonly known as:  ZITHROMAX     benzonatate 200 MG  capsule  Commonly known as:  TESSALON     hydrochlorothiazide 12.5 MG capsule  Commonly known as:  MICROZIDE  Replaced by:  hydrochlorothiazide 25 MG tablet      TAKE these medications        aspirin EC 81 MG tablet  Take 1 tablet (81 mg total) by mouth daily.     atorvastatin 10 MG tablet  Commonly known as:  LIPITOR  TAKE 1 TABLET BY MOUTH EVERY DAY     benazepril 40 MG tablet  Commonly known as:  LOTENSIN  TAKE 1 TABLET (40 MG TOTAL) BY MOUTH DAILY.     carvedilol 12.5 MG tablet  Commonly known as:  COREG  Take 1 tablet (12.5 mg total) by mouth 2 (two) times daily with a meal.     cholecalciferol 1000 UNITS tablet  Commonly known as:  VITAMIN D  Take 1,000 Units by mouth daily.     furosemide 20 MG tablet  Commonly known as:  LASIX  Take 1 tablet (20 mg total) by mouth daily as needed for fluid or edema (For weight gain of 3 pounds in 24 hours or 5 pounds in a week).     Glucosamine-Chondroitin-Vit D3 1500-1200-800 MG-MG-UNIT Pack  Take 1,200 capsules by mouth daily. Joint pain     hydrochlorothiazide 25 MG tablet  Commonly known as:  HYDRODIURIL  Take 1 tablet (25 mg total) by mouth daily.     multivitamin tablet  Take 1 tablet by mouth daily.     ondansetron 4 MG tablet  Commonly known as:  ZOFRAN  Take 4 mg by mouth every 8 (eight) hours as needed.       Follow-up Information    Follow up with Sinclair Grooms, MD On 06/14/2015.   Specialty:  Cardiology   Why:  3 PM   Contact information:   [id number] [place] [address] [phone]      Greater than 30 minutes was spent completing the patient's discharge.    SignedTarri Fuller, PA-C 06/05/2015, 10:12 AM

## 2015-06-09 ENCOUNTER — Telehealth: Payer: Self-pay | Admitting: Interventional Cardiology

## 2015-06-09 MED ORDER — CARVEDILOL 12.5 MG PO TABS: 6.2500 mg | ORAL_TABLET | Freq: Two times a day (BID) | ORAL | Status: DC

## 2015-06-09 NOTE — Telephone Encounter (Signed)
Pt sts that her bp today was 134/74. Pt aware of Sandria Senter (flex) recommendation. Reduce Carvedilol to 6.25mg  bid, if after 2 days symptoms have not improved stop Carvedilol and resume Atenolol 100mg  qd. F/u as planned with Dr.Smith. Pt agreeable and verbalized understanding.

## 2015-06-09 NOTE — Telephone Encounter (Signed)
New Message   Pt c/o medication issue:  1. Name of Medication: Carvedilol  2. How are you currently taking this medication (dosage and times per day)? 1 in the evening  3. Are you having a reaction (difficulty breathing--STAT)? no 4. What is your medication issue? It messed with my mind and had me dizzy

## 2015-06-09 NOTE — Telephone Encounter (Signed)
Returned pt call.[email or web address] 

## 2015-06-09 NOTE — Telephone Encounter (Signed)
Returned pt call. Pt sts that since her d/c from the hospital, she has been having increased fatigue, dizziness, and feels like she is in a fog. She feel that Carvedilol is causing her symptoms and wants to stop. Pt is current,y taking Carvedilol 12.5mg  bid. Pt has not checked her VS, but does have a bp cuff at home. Adv pt to go ahead and ck her bp and heartrate, and will call back in 58min for an update. Pt has an appt with Dr.Smith on 12/20. Pt sts that she cannot wait that long, adv pt that Dr.Smith is out of the office until 12/19. I will talk with another provider in the office and call back with their recommendation. Pt agreeable with plan and verbalized understanding.

## 2015-06-10 ENCOUNTER — Other Ambulatory Visit: Payer: Self-pay | Admitting: Interventional Cardiology

## 2015-06-10 NOTE — Telephone Encounter (Signed)
After reading phone note from yesterday, wanted to be sure ok to reorder requested medication. Please advise. Thanks, MI

## 2015-06-14 ENCOUNTER — Ambulatory Visit (INDEPENDENT_AMBULATORY_CARE_PROVIDER_SITE_OTHER): Payer: Medicare Other | Admitting: Interventional Cardiology

## 2015-06-14 ENCOUNTER — Encounter: Payer: Self-pay | Admitting: Interventional Cardiology

## 2015-06-14 VITALS — BP 126/56 | HR 69 | Ht 66.0 in | Wt 130.1 lb

## 2015-06-14 DIAGNOSIS — Z8679 Personal history of other diseases of the circulatory system: Secondary | ICD-10-CM

## 2015-06-14 DIAGNOSIS — Z9889 Other specified postprocedural states: Secondary | ICD-10-CM | POA: Diagnosis not present

## 2015-06-14 DIAGNOSIS — I25810 Atherosclerosis of coronary artery bypass graft(s) without angina pectoris: Secondary | ICD-10-CM | POA: Diagnosis not present

## 2015-06-14 DIAGNOSIS — I10 Essential (primary) hypertension: Secondary | ICD-10-CM

## 2015-06-14 DIAGNOSIS — I5033 Acute on chronic diastolic (congestive) heart failure: Secondary | ICD-10-CM

## 2015-06-14 DIAGNOSIS — I2510 Atherosclerotic heart disease of native coronary artery without angina pectoris: Secondary | ICD-10-CM

## 2015-06-14 DIAGNOSIS — I480 Paroxysmal atrial fibrillation: Secondary | ICD-10-CM

## 2015-06-14 LAB — BASIC METABOLIC PANEL
BUN: 26 mg/dL — ABNORMAL HIGH (ref 7–25)
CO2: 23 mmol/L (ref 20–31)
Calcium: 9.3 mg/dL (ref 8.6–10.4)
Chloride: 99 mmol/L (ref 98–110)
Creat: 1.05 mg/dL — ABNORMAL HIGH (ref 0.60–0.93)
Glucose, Bld: 107 mg/dL — ABNORMAL HIGH (ref 65–99)
Potassium: 4.2 mmol/L (ref 3.5–5.3)
Sodium: 135 mmol/L (ref 135–146)

## 2015-06-14 NOTE — Progress Notes (Addendum)
Cardiology Office Note   Date:  06/14/2015   ID:  Claudia Howell, DOB 05/06/1941, MRN [id number]  PCP:  Vikki Ports, MD  Cardiologist:  Sinclair Grooms, MD   Chief Complaint  Patient presents with  . Coronary Artery Disease      History of Present Illness: Claudia Howell is a 74 y.o. female who presents for follow-up of diastolic heart failure, CAD, history of paroxysmal atrial fibrillation treated with maze procedure, hypertension, and hyperlipidemia.  She was recently hospitalized with shortness of breath and found to have acute on chronic diastolic heart failure. The symptom was purely dyspnea with no associated discomfort. Diuresis led to dramatic improvement. She now feels groggy and somewhat confused. She feels her medications are contributing to this. She particularly is concerned that carvedilol is causing these symptoms. This medication has been discontinued and atenolol resumed. No significant change/improvrment has occurred. The only other change in her medical regimen was the intensification of hydrochlorothiazide to 25 mg per day instead of 12.5 mg per day.  She denies exertional chest pain, orthopnea, PND, and lower extremity swelling.  Prior to the episode of heart failure she had significant salt intake.  Past Medical History  Diagnosis Date  . Hypertension     Dr.Ladavia Lindenbaum Tamala Julian  . Hyperlipidemia   . Osteopenia 9/09  . Adenomatous colon polyp 10/09  . Coronary artery disease     Last cath 12/14 Cath 12/8 100% SVG occlusion to RCA, 95% SVG.  The stenosis to OM.  LIMA to LAD patent but diffuse disease in LAD after graft.  Overlapping stents mid/distal SVG to OM.  Marland Kitchen PAF (paroxysmal atrial fibrillation) ([place])     during cath/notes 10/16/2012  . Decubitus ulcer of coccyx 10/22/12    1/2 inch raw open area on coccyx  . S/P CABG x 3 10/22/2012    LIMA to LAD, SVG to distal RCA, SVG to OM, EVH from right thigh  . S/P Maze operation for atrial fibrillation 10/22/2012   Left side lesion set using bipolar radiofrequency and cryothermy ablation with clipping of LA appendage    Past Surgical History  Procedure Laterality Date  . Total abdominal hysterectomy w/ bilateral salpingoophorectomy  1990    benign growth  . Tonsillectomy  1949  . Breast cyst excision Left 1980-1990's    x 3  . Maze N/A 10/22/2012    Procedure: MAZE;  Surgeon: Rexene Alberts, MD;  Location: [place];  Service: Open Heart Surgery;  Laterality: N/A;  . Intraoperative transesophageal echocardiogram N/A 10/22/2012    Procedure: INTRAOPERATIVE TRANSESOPHAGEAL ECHOCARDIOGRAM;  Surgeon: Rexene Alberts, MD;  Location: [place];  Service: Open Heart Surgery;  Laterality: N/A;  . Cardiac catheterization  10/16/2012  . Coronary angioplasty with stent placement  04/01/2013    "1" (06/01/2013)  . Coronary artery bypass graft N/A 10/22/2012    Procedure: CORONARY ARTERY BYPASS GRAFTING (CABG);  Surgeon: Rexene Alberts, MD;  Location: [place];  Service: Open Heart Surgery;  Laterality: N/A;  . Dilation and curettage of uterus    . Left heart catheterization with coronary angiogram N/A 10/16/2012    Procedure: LEFT HEART CATHETERIZATION WITH CORONARY ANGIOGRAM;  Surgeon: Sinclair Grooms, MD;  Location: [facility] CATH LAB;  Service: Cardiovascular;  Laterality: N/A;  . Left heart catheterization with coronary/graft angiogram N/A 06/01/2013    Procedure: LEFT HEART CATHETERIZATION WITH Beatrix Fetters;  Surgeon: Sinclair Grooms, MD;  Location: [facility] CATH LAB;  Service: Cardiovascular;  Laterality: N/A;  . Percutaneous coronary stent intervention (pci-s)  06/01/2013    Procedure: PERCUTANEOUS CORONARY STENT INTERVENTION (PCI-S);  Surgeon: Sinclair Grooms, MD;  Location: [facility] CATH LAB;  Service: Cardiovascular;;     Current Outpatient Prescriptions  Medication Sig Dispense Refill  . aspirin EC 81 MG tablet Take 1 tablet (81 mg total) by mouth daily.    Marland Kitchen atenolol (TENORMIN) 100 MG tablet TAKE 1 TABLET BY MOUTH  EVERY DAY 90 tablet 3  . atorvastatin (LIPITOR) 10 MG tablet TAKE 1 TABLET BY MOUTH EVERY DAY 90 tablet 0  . benazepril (LOTENSIN) 40 MG tablet TAKE 1 TABLET (40 MG TOTAL) BY MOUTH DAILY. 90 tablet 0  . cholecalciferol (VITAMIN D) 1000 UNITS tablet Take 1,000 Units by mouth daily.    . Glucosamine-Chondroitin-Vit D3 1500-1200-800 MG-MG-UNIT PACK Take 1,200 capsules by mouth daily. Joint pain    . hydrochlorothiazide (HYDRODIURIL) 25 MG tablet Take 1 tablet (25 mg total) by mouth daily. 30 tablet 11  . Multiple Vitamin (MULTIVITAMIN) tablet Take 1 tablet by mouth daily.     No current facility-administered medications for this visit.    Allergies:   Oxycodone    Social History:  The patient  reports that she has never smoked. She has never used smokeless tobacco. She reports that she drinks about 0.6 oz of alcohol per week. She reports that she does not use illicit drugs.   Family History:  The patient's family history includes Cancer in her brother; Heart attack (age of onset: 13) in her mother; Heart disease in her brother and other; Hypertension in her brother and mother. There is no history of Diabetes, Breast cancer, or Colon cancer.    ROS:  Please see the history of present illness.   Otherwise, review of systems are positive for dizziness, confusion, and feeling out of sorts since discharge..   All other systems are reviewed and negative.    PHYSICAL EXAM: VS:  BP 126/56 mmHg  Pulse 69  Ht 5\' 6"  (1.676 m)  Wt 130 lb 1.9 oz (59.022 kg)  BMI 21.01 kg/m2  SpO2 99% , BMI Body mass index is 21.01 kg/(m^2). GEN: Well nourished, well developed, in no acute distress HEENT: normal Neck: no JVD, carotid bruits, or masses Cardiac: RRR.  There is no murmur, rub, or gallop. There is no edema. Respiratory:  clear to auscultation bilaterally, normal work of breathing. GI: soft, nontender, nondistended, + BS MS: no deformity or atrophy Skin: warm and dry, no rash Neuro:  Strength and  sensation are intact Psych: euthymic mood, full affect   EKG:  EKG is not ordered today.    Recent Labs: 09/09/2014: Magnesium 2.1 06/03/2015: ALT 36*; B Natriuretic Peptide 2098.5*; Hemoglobin 10.8*; Platelets 245 06/04/2015: TSH 2.133 06/05/2015: BUN 24*; Creatinine, Ser 1.18*; Potassium 5.0; Sodium 135    Lipid Panel    Component Value Date/Time   CHOL 140 07/07/2014 0830   TRIG 129 07/07/2014 0830   HDL 46 07/07/2014 0830   CHOLHDL 3.0 07/07/2014 0830   VLDL 26 07/07/2014 0830   LDLCALC 68 07/07/2014 0830      Wt Readings from Last 3 Encounters:  06/14/15 130 lb 1.9 oz (59.022 kg)  06/05/15 133 lb 9.6 oz (60.6 kg)  06/03/15 142 lb 9.6 oz (64.683 kg)      Other studies Reviewed: Additional studies/ records that were reviewed today include: Electronic health record was reviewed.. The findings include hospital discharge summary and tests performed during the recent hospital  stay were reviewed.. The echocardiogram demonstrated an EF of 45-50%.    ASSESSMENT AND PLAN:  1. Acute on chronic diastolic heart failure (HCC) No evidence of volume overload  2. Coronary artery disease involving autologous vein coronary bypass graft without angina pectoris Asymptomatic  3. S/P Maze operation for atrial fibrillation No recent atrial fibrillation  4. Essential hypertension, benign Well controlled  5. Confusion and cognitive cloudiness Rule out hyponatremia    Current medicines are reviewed at length with the patient today.  The patient has the following concerns regarding medicines: feels meds are causiing cognitive slowing..  The following changes/actions have been instituted:    Renal profile and brain natruretic peptide are drawn  We may adjust the diuretic regimen  No specific changes are made until laboratory data returns  Labs/ tests ordered today include:   Orders Placed This Encounter  Procedures  . B Nat Peptide  . Basic metabolic panel      Disposition:   FU with HS in 6 weeks  Signed, Sinclair Grooms, MD  06/14/2015 3:31 PM    [facility] [place], [place], [place]  [address] Phone: [phone]; Fax: [phone]

## 2015-06-14 NOTE — Patient Instructions (Signed)
Medication Instructions:  Your physician recommends that you continue on your current medications as directed. Please refer to the Current Medication list given to you today.   Labwork: Bmet and Bnp today  Testing/Procedures: None ordered  Follow-Up: You have a follow up appointment on 07/27/15 @ 4:15pm  Any Other Special Instructions Will Be Listed Below (If Applicable).     If you need a refill on your cardiac medications before your next appointment, please call your pharmacy.

## 2015-06-15 LAB — BRAIN NATRIURETIC PEPTIDE: Brain Natriuretic Peptide: 99.8 pg/mL (ref 0.0–100.0)

## 2015-06-16 ENCOUNTER — Telehealth: Payer: Self-pay | Admitting: Interventional Cardiology

## 2015-06-16 NOTE — Telephone Encounter (Signed)
New message      Calling to talk about lab test results.

## 2015-06-16 NOTE — Telephone Encounter (Signed)
Pt aware of lab results with verbal understanding. 

## 2015-06-20 ENCOUNTER — Other Ambulatory Visit: Payer: Self-pay | Admitting: Interventional Cardiology

## 2015-06-22 ENCOUNTER — Ambulatory Visit (INDEPENDENT_AMBULATORY_CARE_PROVIDER_SITE_OTHER): Payer: Medicare Other | Admitting: Family Medicine

## 2015-06-22 ENCOUNTER — Encounter: Payer: Self-pay | Admitting: Family Medicine

## 2015-06-22 VITALS — BP 140/70 | HR 58 | Temp 98.0°F | Ht 66.0 in | Wt 131.0 lb

## 2015-06-22 DIAGNOSIS — J309 Allergic rhinitis, unspecified: Secondary | ICD-10-CM

## 2015-06-22 DIAGNOSIS — R5383 Other fatigue: Secondary | ICD-10-CM | POA: Diagnosis not present

## 2015-06-22 DIAGNOSIS — I10 Essential (primary) hypertension: Secondary | ICD-10-CM | POA: Diagnosis not present

## 2015-06-22 DIAGNOSIS — I5043 Acute on chronic combined systolic (congestive) and diastolic (congestive) heart failure: Secondary | ICD-10-CM

## 2015-06-22 LAB — CBC WITH DIFFERENTIAL/PLATELET
Basophils Absolute: 0.1 10*3/uL (ref 0.0–0.1)
Basophils Relative: 1 % (ref 0–1)
Eosinophils Absolute: 0.2 10*3/uL (ref 0.0–0.7)
Eosinophils Relative: 3 % (ref 0–5)
HCT: 33.5 % — ABNORMAL LOW (ref 36.0–46.0)
Hemoglobin: 11.4 g/dL — ABNORMAL LOW (ref 12.0–15.0)
Lymphocytes Relative: 25 % (ref 12–46)
Lymphs Abs: 1.6 10*3/uL (ref 0.7–4.0)
MCH: 31.6 pg (ref 26.0–34.0)
MCHC: 34 g/dL (ref 30.0–36.0)
MCV: 92.8 fL (ref 78.0–100.0)
MPV: 9 fL (ref 8.6–12.4)
Monocytes Absolute: 0.6 10*3/uL (ref 0.1–1.0)
Monocytes Relative: 9 % (ref 3–12)
Neutro Abs: 4 10*3/uL (ref 1.7–7.7)
Neutrophils Relative %: 62 % (ref 43–77)
Platelets: 231 10*3/uL (ref 150–400)
RBC: 3.61 MIL/uL — ABNORMAL LOW (ref 3.87–5.11)
RDW: 13.3 % (ref 11.5–15.5)
WBC: 6.5 10*3/uL (ref 4.0–10.5)

## 2015-06-22 LAB — COMPREHENSIVE METABOLIC PANEL
ALT: 10 U/L (ref 6–29)
AST: 15 U/L (ref 10–35)
Albumin: 3.9 g/dL (ref 3.6–5.1)
Alkaline Phosphatase: 56 U/L (ref 33–130)
BUN: 15 mg/dL (ref 7–25)
CO2: 26 mmol/L (ref 20–31)
Calcium: 8.9 mg/dL (ref 8.6–10.4)
Chloride: 99 mmol/L (ref 98–110)
Creat: 0.8 mg/dL (ref 0.60–0.93)
Glucose, Bld: 96 mg/dL (ref 65–99)
Potassium: 3.8 mmol/L (ref 3.5–5.3)
Sodium: 136 mmol/L (ref 135–146)
Total Bilirubin: 0.4 mg/dL (ref 0.2–1.2)
Total Protein: 7.3 g/dL (ref 6.1–8.1)

## 2015-06-22 NOTE — Patient Instructions (Signed)
  Start taking claritin or Allegra for allergies. Use the mucinex only if secretions are thick. We should consider a sleep study if no other cause for your daytime sleepiness is found. Part of it may be related to poor sleep at night.  Continue the Tylenol PM at bedtime; consider trial of melatonin.  Part of your trouble sleeping may be related to anxiety. Consider some relaxation techniques when you're having trouble getting back to sleep (breathing exercise, meditation).  Try and get regular exercise--this helps with energy and improves sleep at night.  We are doing blood tests to follow up on the anemia that was noted during hospitalization, as well as rechecking your electrolytes.

## 2015-06-22 NOTE — Progress Notes (Signed)
Chief Complaint  Patient presents with  . hospital follow up    discharged from [facility] about 2 weeks ago dx CHF, terrible fatigue, not sleeping well, sides of neck feel swollen, headache seems to come on at night   Patient presents for hospital follow-up.  She was hospitalized 12/9-12/11 with shortness of breath and found to have acute on chronic diastolic heart failure. The symptom was dyspnea,  with no associated discomfort.  She improved with diuresis.  When she saw cardiology in f/u, there was concern that carvedilol was causing side effects, so it was discontinued and atenolol resumed. Her hydrochlorothiazide dose was increased to 25 mg per day from 12.5 mg. BNP was last checked 12/20 and was 99.8  "I have trouble getting through the day" due to fatigue.  Yesterday got lightheaded while at a store.  She isn't sleeping well at night, waking up every 2 hours.  During the day feels like she can doze off when she sits down.  Main complaint is fatigue. The last couple of nights she has noted some sore throat, at the sides of the neck/throat.  Her allergies are not any different than the normal, with some postnasal drainage.  Currently has a frontal headache, and some pressure behind her ears.  No longer having any PND or shortness of breath.  BP's at home are running 115/57 at home, always <130/70 at home.  She has been very careful with monitoring the sodium in her diet, and monitoring her weights daily.  Weights have been stable.  She reports that she developed CHF after having the flu and was "living on instant mashed potatoes".  Denies any bleeding or bruising. No known snoring (lives alone), morning headaches or known apnea. Not sleeping very well at night, waking up every few hours and has trouble going back to sleep. She is also having some crazy dreams since being sick.  Thinks there may be some anxiety.  Gets some weight exercises/stretches at home; not walking or getting other regular  exercise.  PMH, PSH, SH reviewed.  Outpatient Encounter Prescriptions as of 06/22/2015  Medication Sig Note  . aspirin EC 81 MG tablet Take 1 tablet (81 mg total) by mouth daily.   Marland Kitchen atenolol (TENORMIN) 100 MG tablet TAKE 1 TABLET BY MOUTH EVERY DAY   . atorvastatin (LIPITOR) 10 MG tablet TAKE 1 TABLET BY MOUTH EVERY DAY   . benazepril (LOTENSIN) 40 MG tablet TAKE 1 TABLET (40 MG TOTAL) BY MOUTH DAILY.   . cholecalciferol (VITAMIN D) 1000 UNITS tablet Take 1,000 Units by mouth daily.   . Glucosamine-Chondroitin-Vit D3 1500-1200-800 MG-MG-UNIT PACK Take 1,200 capsules by mouth daily. Joint pain   . hydrochlorothiazide (HYDRODIURIL) 25 MG tablet Take 1 tablet (25 mg total) by mouth daily.   . Multiple Vitamin (MULTIVITAMIN) tablet Take 1 tablet by mouth daily.   . furosemide (LASIX) 20 MG tablet Reported on 06/22/2015 06/22/2015: Prn, per cardiologist (not taking now)   No facility-administered encounter medications on file as of 06/22/2015.   She takes 1 tylenol PM every night since hospitalization. This has helped her fall asleep.  Allergies  Allergen Reactions  . Oxycodone Nausea And Vomiting    Extreme nausea and vomiting    ROS:  No fever, chills, chest pain, shortness of breath, edema. No nausea, vomiting, bowel changes, bleeding, bruising, rash. +congestion, fatigue as per HPI  PHYSICAL EXAM: BP 140/70 mmHg  Pulse 58  Temp(Src) 98 F (36.7 C) (Oral)  Ht 5\' 6"  (1.676 m)  Wt 131 lb (59.421 kg)  BMI 21.15 kg/m2  150/70 on repeat by MD  Well developed, pleasant female, in no distress.   HEENT: PERRL, EOMI, conjunctiva clear.  TM's and EAC's normal. Nasal mucosa mildly edematous, no erythema or purulence. OP is clear, sinuses nontender Neck: no lymphadenopathy, thyromegaly or mass Heart: regular rate and rhythm Lungs: clear bilaterally Abdomen: soft, nontender, no organomegaly or mass Extremities: no edema, 2+ pulse Neuro: alert and oriented. Cranial nerves intact,  normal strength, gait Psych: normal mood, affect, hygiene and grooming.  Mildly anxious. Normal speech, eye contact.  Lab Results  Component Value Date   TSH 2.133 06/04/2015   Lab Results  Component Value Date   WBC 12.0* 06/03/2015   HGB 10.8* 06/03/2015   HCT 32.3* 06/03/2015   MCV 92.0 06/03/2015   PLT 245 06/03/2015   Last Vitamin D check was 35 in 09/2012, compliant with taking daily supplements.  ASSESSMENT/PLAN:  Other fatigue - DDx reviewed in detail (meds, allergies, anemia, inadequate/poor sleep, etc). - Plan: Comprehensive metabolic panel, CBC with Differential/Platelet  Essential hypertension, benign - slightly elevated today, normal at home. suspect some anxiety today. continue current meds, home monitoring. - Plan: Comprehensive metabolic panel  Acute on chronic combined systolic and diastolic heart failure (HCC) - improved/stable on current regimen  Allergic rhinitis, unspecified allergic rhinitis type - recommended oral anistamines  Fatigue--recent anemia on labs, recheck. Poor sleep contributes, possibly related to anxiety. Consider sleep study. Treat allergies, which might also contribute to fatigue.  Lipids due, but not fasting today.  Low sodium diet reviewed in detail--went through some of her typical foods out in restaurants, showed her M[employer] app which can show amounts of sodium.  She has been reading labels, but needed education re: foods out.  35-40 min visit ,more than 1/2 spent counseling.   Start taking claritin or Allegra for allergies. Use the mucinex only if secretions are thick. We should consider a sleep study if no other cause for your daytime sleepiness is found. Part of it may be related to poor sleep at night.  Continue the Tylenol PM at bedtime; consider trial of melatonin.  Part of your trouble sleeping may be related to anxiety. Consider some relaxation techniques when you're having trouble getting back to sleep (breathing exercise,  meditation).  Try and get regular exercise--this helps with energy and improves sleep at night.  We are doing blood tests to follow up on the anemia that was noted during hospitalization, as well as rechecking your electrolytes.   Med check 2 months (fasting) Schedule AWV/med check for August

## 2015-06-24 ENCOUNTER — Other Ambulatory Visit: Payer: Self-pay | Admitting: Interventional Cardiology

## 2015-07-04 ENCOUNTER — Other Ambulatory Visit: Payer: Self-pay | Admitting: Interventional Cardiology

## 2015-07-04 MED ORDER — HYDROCHLOROTHIAZIDE 25 MG PO TABS: 25.0000 mg | ORAL_TABLET | Freq: Every day | ORAL | Status: DC

## 2015-07-04 MED ORDER — BENAZEPRIL HCL 40 MG PO TABS: ORAL_TABLET | ORAL | Status: DC

## 2015-07-04 NOTE — Telephone Encounter (Signed)
New message    *STAT* If patient is at the pharmacy, call can be transferred to refill team.   1. Which medications need to be refilled? (please list name of each medication and dose if known)  Benazepril 40mg  and HCTZ 25mg  2. Which pharmacy/location (including street and city if local pharmacy) is medication to be sent to? [email or web address]   3. Do they need a 30 day or 90 day supply? 90 day supply  Please note new pharmacy on her chart

## 2015-07-04 NOTE — Telephone Encounter (Signed)
Pt's Rx was sent to pt's pharmacy as requested. Confirmation received.  °

## 2015-07-14 DIAGNOSIS — H59031 Cystoid macular edema following cataract surgery, right eye: Secondary | ICD-10-CM | POA: Diagnosis not present

## 2015-07-21 DIAGNOSIS — H34811 Central retinal vein occlusion, right eye, with macular edema: Secondary | ICD-10-CM | POA: Diagnosis not present

## 2015-07-21 DIAGNOSIS — H34231 Retinal artery branch occlusion, right eye: Secondary | ICD-10-CM | POA: Diagnosis not present

## 2015-07-21 DIAGNOSIS — H35041 Retinal micro-aneurysms, unspecified, right eye: Secondary | ICD-10-CM | POA: Diagnosis not present

## 2015-07-21 DIAGNOSIS — H3561 Retinal hemorrhage, right eye: Secondary | ICD-10-CM | POA: Diagnosis not present

## 2015-07-26 DIAGNOSIS — H34811 Central retinal vein occlusion, right eye, with macular edema: Secondary | ICD-10-CM | POA: Diagnosis not present

## 2015-07-27 ENCOUNTER — Ambulatory Visit (INDEPENDENT_AMBULATORY_CARE_PROVIDER_SITE_OTHER): Payer: Medicare Other | Admitting: Interventional Cardiology

## 2015-07-27 ENCOUNTER — Encounter: Payer: Self-pay | Admitting: Interventional Cardiology

## 2015-07-27 VITALS — BP 128/60 | HR 91 | Ht 66.0 in | Wt 131.1 lb

## 2015-07-27 DIAGNOSIS — I25810 Atherosclerosis of coronary artery bypass graft(s) without angina pectoris: Secondary | ICD-10-CM

## 2015-07-27 DIAGNOSIS — I10 Essential (primary) hypertension: Secondary | ICD-10-CM

## 2015-07-27 DIAGNOSIS — I5042 Chronic combined systolic (congestive) and diastolic (congestive) heart failure: Secondary | ICD-10-CM

## 2015-07-27 DIAGNOSIS — I480 Paroxysmal atrial fibrillation: Secondary | ICD-10-CM

## 2015-07-27 DIAGNOSIS — E785 Hyperlipidemia, unspecified: Secondary | ICD-10-CM | POA: Diagnosis not present

## 2015-07-27 NOTE — Progress Notes (Signed)
Cardiology Office Note   Date:  07/27/2015   ID:  NAKAILA COLEY, DOB 11-Apr-1941, MRN [id number]  PCP:  Vikki Ports, MD  Cardiologist:  Sinclair Grooms, MD   Chief Complaint  Patient presents with  . Congestive Heart Failure       History of Present Illness: Claudia Howell is a 75 y.o. female who presents for coronary artery disease with bypass graft failure, saphenous vein graft stent, hypertension, acute on chronic systolic and diastolic heart failure, history of paroxysmal atrial fibrillation, maze procedure doing coronary bypass grafting, and essential hypertension.  In December the patient had an upper respiratory illness that eventually led to hospitalization. During the hospital stay she was found to have diastolic heart failure acute on chronic. EF was found to be 40-45%. Increased diuretic regimen was recommended. She improved quickly and is done well since that time. She now tells me that she is seeing Dr. Deloria Lair because when I problem that has been characterized as a "stroke". She does have a history of paroxysmal atrial fibrillation but did not have this while hospitalized in December and certainly had no symptoms of palpitations or rapid heart rate.    Past Medical History  Diagnosis Date  . Hypertension     Dr.Jinelle Butchko Tamala Julian  . Hyperlipidemia   . Osteopenia 9/09  . Adenomatous colon polyp 10/09  . Coronary artery disease     Last cath 12/14 Cath 12/8 100% SVG occlusion to RCA, 95% SVG.  The stenosis to OM.  LIMA to LAD patent but diffuse disease in LAD after graft.  Overlapping stents mid/distal SVG to OM.  Marland Kitchen PAF (paroxysmal atrial fibrillation) ([place])     during cath/notes 10/16/2012  . Decubitus ulcer of coccyx 10/22/12    1/2 inch raw open area on coccyx  . S/P CABG x 3 10/22/2012    LIMA to LAD, SVG to distal RCA, SVG to OM, EVH from right thigh  . S/P Maze operation for atrial fibrillation 10/22/2012    Left side lesion set using bipolar radiofrequency and  cryothermy ablation with clipping of LA appendage    Past Surgical History  Procedure Laterality Date  . Total abdominal hysterectomy w/ bilateral salpingoophorectomy  1990    benign growth  . Tonsillectomy  1949  . Breast cyst excision Left 1980-1990's    x 3  . Maze N/A 10/22/2012    Procedure: MAZE;  Surgeon: Rexene Alberts, MD;  Location: [facility];  Service: Open Heart Surgery;  Laterality: N/A;  . Intraoperative transesophageal echocardiogram N/A 10/22/2012    Procedure: INTRAOPERATIVE TRANSESOPHAGEAL ECHOCARDIOGRAM;  Surgeon: Rexene Alberts, MD;  Location: [facility];  Service: Open Heart Surgery;  Laterality: N/A;  . Cardiac catheterization  10/16/2012  . Coronary angioplasty with stent placement  04/01/2013    "1" (06/01/2013)  . Coronary artery bypass graft N/A 10/22/2012    Procedure: CORONARY ARTERY BYPASS GRAFTING (CABG);  Surgeon: Rexene Alberts, MD;  Location: [facility];  Service: Open Heart Surgery;  Laterality: N/A;  . Dilation and curettage of uterus    . Left heart catheterization with coronary angiogram N/A 10/16/2012    Procedure: LEFT HEART CATHETERIZATION WITH CORONARY ANGIOGRAM;  Surgeon: Sinclair Grooms, MD;  Location: [facility] CATH LAB;  Service: Cardiovascular;  Laterality: N/A;  . Left heart catheterization with coronary/graft angiogram N/A 06/01/2013    Procedure: LEFT HEART CATHETERIZATION WITH Beatrix Fetters;  Surgeon: Sinclair Grooms, MD;  Location: [facility] CATH LAB;  Service: Cardiovascular;  Laterality: N/A;  . Percutaneous coronary stent intervention (pci-s)  06/01/2013    Procedure: PERCUTANEOUS CORONARY STENT INTERVENTION (PCI-S);  Surgeon: Sinclair Grooms, MD;  Location[facility] C[facility] LAB;  Service: Cardiovascular;;     Current Outpatient Prescriptions  Medication Sig Dispense Refill  . aspirin EC 81 MG tablet Take 1 tablet (81 mg total) by mouth daily.    Marland Kitchen atenolol (TENORMIN) 100 MG tablet TAKE 1 TABLET BY MOUTH EVERY DAY 90 tablet 3  . atorvastatin (LIPITOR) 10  MG tablet TAKE 1 TABLET BY MOUTH EVERY DAY 90 tablet 0  . benazepril (LOTENSIN) 40 MG tablet TAKE 1 TABLET (40 MG TOTAL) BY MOUTH DAILY. 90 tablet 3  . cholecalciferol (VITAMIN D) 1000 UNITS tablet Take 1,000 Units by mouth daily.    . furosemide (LASIX) 20 MG tablet Reported on 06/22/2015  4  . Glucosamine-Chondroitin-Vit D3 1500-1200-800 MG-MG-UNIT PACK Take 1,200 capsules by mouth daily. Joint pain    . hydrochlorothiazide (HYDRODIURIL) 25 MG tablet Take 1 tablet (25 mg total) by mouth daily. 90 tablet 3  . Multiple Vitamin (MULTIVITAMIN) tablet Take 1 tablet by mouth daily.     No current facility-administered medications for this visit.    Allergies:   Oxycodone    Social History:  The patient  reports that she has never smoked. She has never used smokeless tobacco. She reports that she drinks about 0.6 oz of alcohol per week. She reports that she does not use illicit drugs.   Family History:  The patient's family history includes Cancer in her brother; Heart attack (age of onset: 33) in her mother; Heart disease in her brother and other; Hypertension in her brother and mother. There is no history of Diabetes, Breast cancer, or Colon cancer.    ROS:  Please see the history of present illness.   Otherwise, review of systems are positive for vision disturbance, right eye and fear of anticoagulation therapy.   All other systems are reviewed and negative.    PHYSICAL EXAM: VS:  BP 128/60 mmHg  Pulse 91  Ht 5\' 6"  (1.676 m)  Wt 131 lb 1.9 oz (59.476 kg)  BMI 21.17 kg/m2 , BMI Body mass index is 21.17 kg/(m^2). GEN: Well nourished, well developed, in no acute distress HEENT: normal Neck: no JVD, carotid bruits, or masses Cardiac: RRR.  There is no murmur, rub, or gallop. There is no edema. Respiratory:  clear to auscultation bilaterally, normal work of breathing. GI: soft, nontender, nondistended, + BS MS: no deformity or atrophy Skin: warm and dry, no rash Neuro:  Strength and  sensation are intact Psych: euthymic mood, full affect   EKG:  EKG is not ordered today.   Recent Labs: 09/09/2014: Magnesium 2.1 06/03/2015: B Natriuretic Peptide 2098.5* 06/04/2015: TSH 2.133 06/22/2015: ALT 10; BUN 15; Creat 0.80; Hemoglobin 11.4*; Platelets 231; Potassium 3.8; Sodium 136    Lipid Panel    Component Value Date/Time   CHOL 140 07/07/2014 0830   TRIG 129 07/07/2014 0830   HDL 46 07/07/2014 0830   CHOLHDL 3.0 07/07/2014 0830   VLDL 26 07/07/2014 0830   LDLCALC 68 07/07/2014 0830      Wt Readings from Last 3 Encounters:  07/27/15 131 lb 1.9 oz (59.476 kg)  06/22/15 131 lb (59.421 kg)  06/14/15 130 lb 1.9 oz (59.022 kg)      Other studies Reviewed: Additional studies/ records that were reviewed today include: Reviewed recent electronic health record entries.. No data is available  from his ophthalmologist.    ASSESSMENT AND PLAN:  1. Paroxysmal atrial fibrillation (HCC) No diagnosed recurrences  2. Coronary artery disease involving autologous vein coronary bypass graft without angina pectoris Asymptomatic  3. Chronic combined systolic and diastolic heart failure No evidence of volume overload  4. Essential hypertension, benign Well controlled  5. Hyperlipidemia On therapy  6. Right eye infarct     Current medicines are reviewed at length with the patient today.  The patient has the following concerns regarding medicines: None.  The following changes/actions have been instituted:    Continue to monitor closely by daily weights and limiting salt in diet.  Physical activity as tolerated  Get information from Dr. Deloria Lair concerning the patient's ophthalmologic problem to rule out cholesterol emboli which would imply carotid disease or recurrent atrial fibrillation with cardiogenic emboli.  Labs/ tests ordered today include:  No orders of the defined types were placed in this encounter.     Disposition:   FU with HS in 6  months  Signed, Sinclair Grooms, MD  07/27/2015 5:04 PM    [facility] [place] [address], [place], [place]  [address] Phone: [phone]; Fax: [phone]

## 2015-07-27 NOTE — Patient Instructions (Signed)
Medication Instructions:  Your physician recommends that you continue on your current medications as directed. Please refer to the Current Medication list given to you today.   Labwork: None ordered  Testing/Procedures: None ordered  Follow-Up: Your physician wants you to follow-up in: 6 months with Dr. Smith. You will receive a reminder letter in the mail two months in advance. If you don't receive a letter, please call our office to schedule the follow-up appointment.   Any Other Special Instructions Will Be Listed Below (If Applicable).     If you need a refill on your cardiac medications before your next appointment, please call your pharmacy.   

## 2015-08-04 ENCOUNTER — Other Ambulatory Visit: Payer: Self-pay | Admitting: Family Medicine

## 2015-08-24 DIAGNOSIS — H34811 Central retinal vein occlusion, right eye, with macular edema: Secondary | ICD-10-CM | POA: Diagnosis not present

## 2015-08-31 ENCOUNTER — Encounter: Payer: Self-pay | Admitting: Family Medicine

## 2015-09-21 ENCOUNTER — Other Ambulatory Visit: Payer: Self-pay

## 2015-09-21 DIAGNOSIS — Z1231 Encounter for screening mammogram for malignant neoplasm of breast: Secondary | ICD-10-CM

## 2015-09-28 DIAGNOSIS — H34811 Central retinal vein occlusion, right eye, with macular edema: Secondary | ICD-10-CM | POA: Diagnosis not present

## 2015-10-11 ENCOUNTER — Ambulatory Visit
Admission: RE | Admit: 2015-10-11 | Discharge: 2015-10-11 | Disposition: A | Discharge status: Home / Self Care | Payer: Medicare Other | Source: Ambulatory Visit

## 2015-10-11 DIAGNOSIS — Z1231 Encounter for screening mammogram for malignant neoplasm of breast: Secondary | ICD-10-CM | POA: Diagnosis not present

## 2015-11-02 DIAGNOSIS — H34811 Central retinal vein occlusion, right eye, with macular edema: Secondary | ICD-10-CM | POA: Diagnosis not present

## 2015-12-07 DIAGNOSIS — H34231 Retinal artery branch occlusion, right eye: Secondary | ICD-10-CM | POA: Diagnosis not present

## 2015-12-07 DIAGNOSIS — H34811 Central retinal vein occlusion, right eye, with macular edema: Secondary | ICD-10-CM | POA: Diagnosis not present

## 2015-12-16 ENCOUNTER — Encounter (HOSPITAL_COMMUNITY): Payer: Self-pay | Admitting: *Deleted

## 2015-12-19 ENCOUNTER — Ambulatory Visit (INDEPENDENT_AMBULATORY_CARE_PROVIDER_SITE_OTHER): Payer: Medicare Other | Admitting: Family Medicine

## 2015-12-19 ENCOUNTER — Encounter: Payer: Self-pay | Admitting: Family Medicine

## 2015-12-19 VITALS — BP 140/64 | HR 64 | Ht 65.25 in | Wt 133.2 lb

## 2015-12-19 DIAGNOSIS — E7800 Pure hypercholesterolemia, unspecified: Secondary | ICD-10-CM

## 2015-12-19 DIAGNOSIS — I6521 Occlusion and stenosis of right carotid artery: Secondary | ICD-10-CM

## 2015-12-19 DIAGNOSIS — I25810 Atherosclerosis of coronary artery bypass graft(s) without angina pectoris: Secondary | ICD-10-CM

## 2015-12-19 DIAGNOSIS — R5383 Other fatigue: Secondary | ICD-10-CM

## 2015-12-19 DIAGNOSIS — I10 Essential (primary) hypertension: Secondary | ICD-10-CM | POA: Diagnosis not present

## 2015-12-19 DIAGNOSIS — L918 Other hypertrophic disorders of the skin: Secondary | ICD-10-CM

## 2015-12-19 DIAGNOSIS — L089 Local infection of the skin and subcutaneous tissue, unspecified: Secondary | ICD-10-CM

## 2015-12-19 LAB — LIPID PANEL
Cholesterol: 180 mg/dL (ref 125–200)
HDL: 46 mg/dL (ref >=46)
LDL Cholesterol: 93 mg/dL (ref <130)
Total CHOL/HDL Ratio: 3.9 Ratio (ref <=5.0)
Triglycerides: 207 mg/dL — ABNORMAL HIGH (ref <150)
VLDL: 41 mg/dL — ABNORMAL HIGH (ref <30)

## 2015-12-19 LAB — COMPREHENSIVE METABOLIC PANEL
ALT: 11 U/L (ref 6–29)
AST: 15 U/L (ref 10–35)
Albumin: 4.1 g/dL (ref 3.6–5.1)
Alkaline Phosphatase: 36 U/L (ref 33–130)
BUN: 18 mg/dL (ref 7–25)
CO2: 27 mmol/L (ref 20–31)
Calcium: 9.1 mg/dL (ref 8.6–10.4)
Chloride: 103 mmol/L (ref 98–110)
Creat: 0.73 mg/dL (ref 0.60–0.93)
Glucose, Bld: 92 mg/dL (ref 65–99)
Potassium: 3.6 mmol/L (ref 3.5–5.3)
Sodium: 138 mmol/L (ref 135–146)
Total Bilirubin: 0.5 mg/dL (ref 0.2–1.2)
Total Protein: 7.1 g/dL (ref 6.1–8.1)

## 2015-12-19 LAB — TSH: TSH: 1.1 mIU/L

## 2015-12-19 NOTE — Patient Instructions (Signed)
Continue your current medications. Periodically check your blood pressure at home to make sure it is at goal (<130/80)--it was a little elevated in the office without you taking your medications yet this morning.  Use hydrocortisone cream to the itchy skin tag, as needed to help with itching. I recommend using a bandage to cover it to try and prevent further irritation.  This should then be less irritated, and not cause any symptoms.  You've probably had this skin tag for a very long time, but is only symptomatic since it got irritated.  If it continues to bother you, we can remove it for you.  I suspect that the numbness in your left hand/arm is related to a compression of a nerve that is positional (depending on your position of sleep).  The fact that it goes away quickly with change in position, without symptoms at other times or any weakness, is reassuring.  Please pay attention to which fingers are actually tingling/numb.  It isn't usually both the thumb and the pinkie. The thumb is involved when it is the median nerve (carpal tunnel syndrome--wearing wrist braces to sleep can help prevent) vs the pinkie is related to ulnar nerve compression (at the elbow). Based on your exam in the office, I suspect carpal tunnel syndrome--you can try wearing a wrist brace to sleep at night (and during the day if you find similar symptoms triggered by other activities).  I do recommend repeating the carotid ultrasound, as we discussed.  Verdene Lennert can set that up and we will make sure to forward the results to Dr. Tamala Julian.   Carpal Tunnel Syndrome Carpal tunnel syndrome is a condition that causes pain in your hand and arm. The carpal tunnel is a narrow area located on the palm side of your wrist. Repeated wrist motion or certain diseases may cause swelling within the tunnel. This swelling pinches the main nerve in the wrist (median nerve). CAUSES  This condition may be caused by:   Repeated wrist  motions.  Wrist injuries.  Arthritis.  A cyst or tumor in the carpal tunnel.  Fluid buildup during pregnancy. Sometimes the cause of this condition is not known.  RISK FACTORS This condition is more likely to develop in:   People who have jobs that cause them to repeatedly move their wrists in the same motion, such as butchers and cashiers.  Women.  People with certain conditions, such as:  Diabetes.  Obesity.  An underactive thyroid (hypothyroidism).  Kidney failure. SYMPTOMS  Symptoms of this condition include:   A tingling feeling in your fingers, especially in your thumb, index, and middle fingers.  Tingling or numbness in your hand.  An aching feeling in your entire arm, especially when your wrist and elbow are bent for long periods of time.  Wrist pain that goes up your arm to your shoulder.  Pain that goes down into your palm or fingers.  A weak feeling in your hands. You may have trouble grabbing and holding items. Your symptoms may feel worse during the night.  DIAGNOSIS  This condition is diagnosed with a medical history and physical exam. You may also have tests, including:   An electromyogram (EMG). This test measures electrical signals sent by your nerves into the muscles.  X-rays. TREATMENT  Treatment for this condition includes:  Lifestyle changes. It is important to stop doing or modify the activity that caused your condition.  Physical or occupational therapy.  Medicines for pain and inflammation. This may include medicine  that is injected into your wrist.  A wrist splint.  Surgery. HOME CARE INSTRUCTIONS  If You Have a Splint:  Wear it as told by your health care provider. Remove it only as told by your health care provider.  Loosen the splint if your fingers become numb and tingle, or if they turn cold and blue.  Keep the splint clean and dry. General Instructions  Take over-the-counter and prescription medicines only as told by  your health care provider.  Rest your wrist from any activity that may be causing your pain. If your condition is work related, talk to your employer about changes that can be made, such as getting a wrist pad to use while typing.  If directed, apply ice to the painful area:  Put ice in a plastic bag.  Place a towel between your skin and the bag.  Leave the ice on for 20 minutes, 2-3 times per day.  Keep all follow-up visits as told by your health care provider. This is important.  Do any exercises as told by your health care provider, physical therapist, or occupational therapist. [place] IF:   You have new symptoms.  Your pain is not controlled with medicines.  Your symptoms get worse.   This information is not intended to replace advice given to you by your health care provider. Make sure you discuss any questions you have with your health care provider.   Document Released: 06/08/2000 Document Revised: 03/02/2015 Document Reviewed: 10/27/2014 Elsevier Interactive Patient Education [employer].

## 2015-12-19 NOTE — Progress Notes (Signed)
Spoke with patient and verified that she did not have US done 10/2014-she will check with Dr.Emani Taussig and confirm that he would still like done.

## 2015-12-19 NOTE — Progress Notes (Signed)
Chief Complaint  Patient presents with  . Hyperlipidemia    fasting med check. Has some questions but did not want to tell me. Also she will check with Dr.Smith about follow up carotid u/s.   She is complaining of an itchy bump located at her right lower back/upper buttock. This has been bothering her for about 3 months. It hasn't gotten larger or worse.  Second concern is that she wakes up frequently with her left hand numb, to the point where it is painful.  Occurs when she sleeps with her elbow bent, not in other positions.  She has noticed this over the last year, somewhat more intense recently, where it wakes her up.   Denies weakness in the hand. Denies significant numbness or symptoms during the day or with activities. She embroiders, but is right-handed.  Sleeping okay--improved from when she was last seen in December, back to her baseline.  She was seen in December for hospital f/u after CHF exacerbation.  After that, she developed Right central retinal vein occlusion and retinal artery branch occlusion, and has been under the care of Dr. Zadie Rhine, whom she reports seeing every 6 weeks for injections. The vision in her right eye is improving.  Hypertension follow-up:  Blood pressures are only checked occasionally elsewhere, if feeling bad.  She hasn't checked it recently.  Denies dizziness, headaches, chest pain. Denies side effects of medications. Denies muscle cramps. She had CHF back in December, and hasn't had any recurrent symptoms. Checking weights, have been stable.  No edema. She eats a low sodium diet. She hasn't taken her BP meds yet this morning. BP normal at Dr. Thompson Caul visit in February on the same regimen.  Hyperlipidemia follow-up:  Patient is reportedly following a low-fat, low cholesterol diet.  She denied noncompliance with medications and denies medication side effects. When looking at dates of last refill (and that she should have run out), she admits that has been  cutting the atorvastatin in half.  She was having leg cramps that improved after cutting the dose.  She hasn't been taking Coenzyme Q10, recalls taking it in the past (can't recall why it was stopped, maybe just ran out).  Allergies--not currently taking anything, not flaring currently.  She uses coricidin prn.  Carotid artery disease: ultrasound in 2015 showed right carotid bifurcation and proximal ICA plaque, resulting in less than 50% diameter stenosis. Continued surveillance was recommended She denies any neuro symptoms except the left hand/arm numbness at night as stated above. Dr. Tamala Julian recommended repeat in 1 year (last done 07/2013), which pt was reminded of at her visit then, but she still hasn't had this done.  She is past due for AWV, and doesn't have one scheduled.  PMH, PSH, SH and FH were reviewed and updated. She has a new great granddaughter  Immunization History  Administered Date(s) Administered  . Influenza Split 05/07/2012  . Influenza, High Dose Seasonal PF 04/22/2013, 03/25/2014, 04/08/2015  . Pneumococcal Conjugate-13 09/09/2014  . Pneumococcal Polysaccharide-23 06/30/2009  . Td 06/25/2002  . Tdap 01/23/2009  . Zoster 01/24/2008   Outpatient Encounter Prescriptions as of 12/19/2015  Medication Sig Note  . aspirin EC 81 MG tablet Take 1 tablet (81 mg total) by mouth daily.   Marland Kitchen atenolol (TENORMIN) 100 MG tablet TAKE 1 TABLET BY MOUTH EVERY DAY   . atorvastatin (LIPITOR) 10 MG tablet TAKE 1 TABLET BY MOUTH EVERY DAY   . benazepril (LOTENSIN) 40 MG tablet TAKE 1 TABLET (40 MG TOTAL) BY  MOUTH DAILY.   . cholecalciferol (VITAMIN D) 1000 UNITS tablet Take 1,000 Units by mouth daily.   . Glucosamine-Chondroitin-Vit D3 1500-1200-800 MG-MG-UNIT PACK Take 1,200 capsules by mouth daily. Joint pain   . hydrochlorothiazide (HYDRODIURIL) 25 MG tablet Take 1 tablet (25 mg total) by mouth daily.   . Multiple Vitamin (MULTIVITAMIN) tablet Take 1 tablet by mouth daily.   . furosemide  (LASIX) 20 MG tablet Reported on 12/19/2015 06/22/2015: Prn, per cardiologist (not taking now)   No facility-administered encounter medications on file as of 12/19/2015.   (only taking 1/2 of atorvastatin daily)  Allergies  Allergen Reactions  . Oxycodone Nausea And Vomiting    Extreme nausea and vomiting    ROS:  No fever, chills, URI symptoms, headaches, dizziness, chest pain, palpitations, shortness of breath, edema, leg cramps (none since cutting the lipitor in half).  No nausea, vomiting, heartburn, bowel changes, urinary complaints, bleeding, bruising, rash.  Denies joint pains.  Numbness in left hand as per HPI  Vision is improving in her right eye.  PHYSICAL EXAM: BP 160/76 mmHg  Pulse 64  Ht 5' 5.25" (1.657 m)  Wt 133 lb 3.2 oz (60.419 kg)  BMI 22.01 kg/m2  140/64 on repeat by MD, RA Well developed, pleasant female in no distress HEENT: PERRL, EOMI, conjunctiva and sclera are clear.  OP is clear Neck: no lymphadenopathy, thyromegaly or mass. Slight bruit noted R carotid Heart: regular rate and rhythm Lungs: clear bilaterally Back: no CVA tenderness Abdomen: soft, nontender, no organomegaly or mass Extremities: no edema, 2+ pulses.  +Phalen's, causing numbness/burning into her 2nd through 4th fingers.  Negative Tinel.  nontender over the ulnar nerve Skin: Right upper buttock area--fleshy, slightly pink/inflamed, soft skin tag  ASSESSMENT/PLAN:  Pure hypercholesterolemia - noncompliant--self-reduced to 1/2 tablet.  Check lipids today, and if not at goal, change back to 39m; if not tolerating (with coenzyme Q10, change to Crestor) - Plan: Lipid panel, Comprehensive metabolic panel  Essential hypertension, benign - elevated today, improved on recheck, suspect lower at home (no meds yet otday). Continue current meds, low sodium diet, regular exercise - Plan: Comprehensive metabolic panel  Carotid artery obstruction, right - past due for recheck carotid u/s--schedule - Plan:  [place] Duplex Bilateral  Coronary artery disease involving autologous vein coronary bypass graft without angina pectoris - stable, asymptomatic  Other fatigue - Plan: Comprehensive metabolic panel, TSH  Inflamed skin tag   c-met, lipid, TSH  Only on 572mof lipitor--if lipids not at goal, increase to full tablet and restart the coenzyme Q10.  If still problematic, switch back to Crestor--58m56mf crestor may get LDL lower than 58mg76m lipitor.   6 months AWV/med check+  Use hydrocortisone cream to the itchy skin tag, as needed to help with itching. I recommend using a bandage to cover it to try and prevent further irritation.  This should then be less irritated, and not cause any symptoms.  You've probably had this skin tag for a very long time, but is only symptomatic since it got irritated.  If it continues to bother you, we can remove it for you.  I suspect that the numbness in your left hand/arm is related to a compression of a nerve that is positional (depending on your position of sleep).  The fact that it goes away quickly with change in position, without symptoms at other times or any weakness, is reassuring.  Please pay attention to which fingers are actually tingling/numb.  It isn't usually both  the thumb and the pinkie. The thumb is involved when it is the median nerve (carpal tunnel syndrome--wearing wrist braces to sleep can help prevent) vs the pinkie is related to ulnar nerve compression (at the elbow). Based on your exam in the office, I suspect carpal tunnel syndrome--you can try wearing a wrist brace to sleep at night (and during the day if you find similar symptoms triggered by other activities).  I do recommend repeating the carotid ultrasound, as we discussed.  Verdene Lennert can set that up and we will make sure to forward the results to Dr. Tamala Julian.

## 2015-12-23 ENCOUNTER — Other Ambulatory Visit: Payer: Medicare Other

## 2015-12-23 NOTE — Progress Notes (Signed)
lmtcb

## 2015-12-28 ENCOUNTER — Telehealth: Payer: Self-pay

## 2015-12-29 ENCOUNTER — Ambulatory Visit
Admission: RE | Admit: 2015-12-29 | Discharge: 2015-12-29 | Disposition: A | Discharge status: Home / Self Care | Payer: Medicare Other | Source: Ambulatory Visit | Attending: Family Medicine | Admitting: Family Medicine

## 2015-12-29 DIAGNOSIS — I6523 Occlusion and stenosis of bilateral carotid arteries: Secondary | ICD-10-CM | POA: Diagnosis not present

## 2015-12-29 DIAGNOSIS — I6521 Occlusion and stenosis of right carotid artery: Secondary | ICD-10-CM

## 2015-12-29 NOTE — Telephone Encounter (Signed)
Error

## 2016-01-02 ENCOUNTER — Other Ambulatory Visit: Payer: Self-pay | Admitting: Family Medicine

## 2016-01-18 DIAGNOSIS — H3561 Retinal hemorrhage, right eye: Secondary | ICD-10-CM | POA: Diagnosis not present

## 2016-01-18 DIAGNOSIS — H34811 Central retinal vein occlusion, right eye, with macular edema: Secondary | ICD-10-CM | POA: Diagnosis not present

## 2016-01-18 DIAGNOSIS — H35041 Retinal micro-aneurysms, unspecified, right eye: Secondary | ICD-10-CM | POA: Diagnosis not present

## 2016-02-06 ENCOUNTER — Ambulatory Visit: Payer: Self-pay | Admitting: Family Medicine

## 2016-02-24 ENCOUNTER — Other Ambulatory Visit: Payer: Self-pay

## 2016-03-14 DIAGNOSIS — H34811 Central retinal vein occlusion, right eye, with macular edema: Secondary | ICD-10-CM | POA: Diagnosis not present

## 2016-04-13 ENCOUNTER — Other Ambulatory Visit (INDEPENDENT_AMBULATORY_CARE_PROVIDER_SITE_OTHER): Payer: Medicare Other

## 2016-04-13 DIAGNOSIS — Z23 Encounter for immunization: Secondary | ICD-10-CM | POA: Diagnosis not present

## 2016-05-16 DIAGNOSIS — H34811 Central retinal vein occlusion, right eye, with macular edema: Secondary | ICD-10-CM | POA: Diagnosis not present

## 2016-06-21 ENCOUNTER — Other Ambulatory Visit: Payer: Self-pay | Admitting: Cardiology

## 2016-06-21 NOTE — Telephone Encounter (Signed)
REFILL 

## 2016-06-22 ENCOUNTER — Other Ambulatory Visit: Payer: Self-pay | Admitting: Interventional Cardiology

## 2016-07-19 ENCOUNTER — Other Ambulatory Visit: Payer: Self-pay | Admitting: Family Medicine

## 2016-08-01 DIAGNOSIS — H34811 Central retinal vein occlusion, right eye, with macular edema: Secondary | ICD-10-CM | POA: Diagnosis not present

## 2016-09-02 ENCOUNTER — Other Ambulatory Visit: Payer: Self-pay | Admitting: Interventional Cardiology

## 2016-09-05 ENCOUNTER — Ambulatory Visit (INDEPENDENT_AMBULATORY_CARE_PROVIDER_SITE_OTHER): Payer: Medicare Other | Admitting: Interventional Cardiology

## 2016-09-05 ENCOUNTER — Encounter: Payer: Self-pay | Admitting: Interventional Cardiology

## 2016-09-05 VITALS — BP 140/70 | HR 69 | Ht 66.0 in | Wt 134.4 lb

## 2016-09-05 DIAGNOSIS — Z951 Presence of aortocoronary bypass graft: Secondary | ICD-10-CM | POA: Diagnosis not present

## 2016-09-05 DIAGNOSIS — Z9889 Other specified postprocedural states: Secondary | ICD-10-CM

## 2016-09-05 DIAGNOSIS — I6529 Occlusion and stenosis of unspecified carotid artery: Secondary | ICD-10-CM

## 2016-09-05 DIAGNOSIS — I209 Angina pectoris, unspecified: Secondary | ICD-10-CM

## 2016-09-05 DIAGNOSIS — I10 Essential (primary) hypertension: Secondary | ICD-10-CM

## 2016-09-05 DIAGNOSIS — I5032 Chronic diastolic (congestive) heart failure: Secondary | ICD-10-CM

## 2016-09-05 DIAGNOSIS — E7849 Other hyperlipidemia: Secondary | ICD-10-CM

## 2016-09-05 DIAGNOSIS — I25711 Atherosclerosis of autologous vein coronary artery bypass graft(s) with angina pectoris with documented spasm: Secondary | ICD-10-CM

## 2016-09-05 DIAGNOSIS — E784 Other hyperlipidemia: Secondary | ICD-10-CM | POA: Diagnosis not present

## 2016-09-05 DIAGNOSIS — Z8679 Personal history of other diseases of the circulatory system: Secondary | ICD-10-CM

## 2016-09-05 MED ORDER — FUROSEMIDE 20 MG PO TABS: 20.0000 mg | ORAL_TABLET | ORAL | 4 refills | Status: DC | PRN

## 2016-09-05 NOTE — Patient Instructions (Signed)
Medication Instructions: Your physician recommends that you continue on your current medications as directed. Please refer to the Current Medication list given to you today.   Labwork: None Ordered  Procedures/Testing: None Ordered  Follow-Up: Your physician wants you to follow-up in 1 YEAR with Dr. Tamala Julian. You will receive a reminder letter in the mail two months in advance. If you don't receive a letter, please call our office to schedule the follow-up appointment.   Any Additional Special Instructions Will Be Listed Below (If Applicable).     If you need a refill on your cardiac medications before your next appointment, please call your pharmacy.  Your physician recommends that you continue on your current medications as directed. Please refer to the Current Medication list given to you today.

## 2016-09-05 NOTE — Progress Notes (Signed)
Chief Complaint  Patient presents with  . Medicare Wellness    fasting AWV/med check plus with pelvic. No concerns,     Claudia Howell is a 76 y.o. female who presents for annual wellness visit and follow-up on chronic medical conditions.  She has the following concerns:  She notices a hard "knot" on the inside of the vagina that comes and goes. She feels it when she wipes, it is not painful.  The prominence fluctuates, possibly with straining or position.  Hypertension follow-up:  Blood pressures are only checked occasionally elsewhere, if feeling bad.  She hasn't checked it recently. Denies dizziness, headaches, chest pain. Denies side effects of medications. Denies muscle cramps. She denies any CHF exacerbations in the last year. Checking weights, have been stable.  No edema. She eats a low sodium diet. She is under the care of Dr. Tamala Julian for coronary artery disease with bypass graft failure, saphenous vein graft stent to obtuse marginal and known occlusion of vein graft to RCA, hypertension, acute on chronic systolic and diastolic heart failure, history of paroxysmal atrial fibrillation, maze procedure doing coronary bypass grafting, and essential hypertension. She was last seen yesterday.  No regimen changes were made. BP yesterday was 140/70.   Hyperlipidemia follow-up:  Patient is reportedly following a low-fat, low cholesterol diet.  She had been cutting the '10mg'$  of atorvastatin in half prior to her last labs.  She was having leg cramps that improved after cutting the dose.  She hadn't been taking Coenzyme Q10 at that time. She was advised to increase back up to the full '10mg'$  tablet (due to LDL not being at goal), and to take along with coenzyme Q10 (and to contact us to change med if not tolerating). She has been taking full '10mg'$  tablet along with coenzyme Q10 since last labs. Every 4-6 weeks she gets some muscle cramps, and stops the lipitor for 3 days, they resolve, and she resumes it at  '10mg'$  daily. Last missed pills for cramps about 3-4 weeks ago. She prefers not to change statin if at all possible. Previously documented in notes that Atorvastatin dose had been cut from '20mg'$  to '10mg'$  due to side effects, and that her leg cramps were even worse on Crestor in the past.   Goal of LDL<70 (per Dr. Tamala Julian). Lab Results  Component Value Date   CHOL 180 12/19/2015   HDL 46 12/19/2015   LDLCALC 93 12/19/2015   TRIG 207 (H) 12/19/2015   CHOLHDL 3.9 12/19/2015    Allergies--not currently taking anything, not flaring currently.  She uses coricidin prn in the past, none recently.  Carotid artery disease: Last checked 12/2015, and was stable. IMPRESSION: 1. Bilateral carotid bifurcation and proximal ICA plaque, right worse than left, resulting in less than 50% diameter stenosis. The exam does not exclude plaque ulceration or embolization. Continued surveillance recommended. 2.  Antegrade bilateral vertebral arterial flow.   Immunization History  Administered Date(s) Administered  . Influenza Split 05/07/2012  . Influenza, High Dose Seasonal PF 04/22/2013, 03/25/2014, 04/08/2015, 04/13/2016  . Pneumococcal Conjugate-13 09/09/2014  . Pneumococcal Polysaccharide-23 06/30/2009  . Td 06/25/2002  . Tdap 01/23/2009  . Zoster 01/24/2008   Last Pap smear: n/a (hysterectomy for benign reasons) Last mammogram: 09/2015 (bre[facility] center) Last colonoscopy: 10/2014--multiple tubular adenomas; repeat due 10/2017 (5yr Last DEXA: 09/2011 (very mild osteopenia)--[place] Exercise: Stationery bike daily 10-15 minutes. Uses 5# weights sporadically, about 2x/week Dentist: twice yearly Ophtho: 2 years ago; sees retinal specialist every 3 months (getting injections)  Other doctors caring for patient include: Cardiology: Dr. Tamala Julian Ophtho: Dr. Peter Garter Retinal specialist: Dr. Zadie Rhine Dentist: Dr. Raelyn Ensign GI: Dr. Michail Sermon  Depression screen:  negative Fall screen: negative Functional status screen:  Notable only for vision issue (sees retinal specialist). See full questionnaires in Epic  End of Life Discussion:  Patient has a living will and medical power of attorney  Past Medical History:  Diagnosis Date  . Adenomatous colon polyp 10/09  . Coronary artery disease    Last cath 12/14 Cath 12/8 100% SVG occlusion to RCA, 95% SVG.  The stenosis to OM.  LIMA to LAD patent but diffuse disease in LAD after graft.  Overlapping stents mid/distal SVG to OM.  Marland Kitchen Decubitus ulcer of coccyx 10/22/12   1/2 inch raw open area on coccyx  . Hyperlipidemia   . Hypertension    Dr.Henry Tamala Julian  . Osteopenia 9/09  . PAF (paroxysmal atrial fibrillation) ([facility])    during cath/notes 10/16/2012  . S/P CABG x 3 10/22/2012   LIMA to LAD, SVG to distal RCA, SVG to OM, EVH from right thigh  . S/P Maze operation for atrial fibrillation 10/22/2012   Left side lesion set using bipolar radiofrequency and cryothermy ablation with clipping of LA appendage    Past Surgical History:  Procedure Laterality Date  . BREAST CYST EXCISION Left 1980-1990's   x 3  . CARDIAC CATHETERIZATION  10/16/2012  . CATARACT EXTRACTION, BILATERAL Bilateral    03/2015 and 05/2015  . CORONARY ANGIOPLASTY WITH STENT PLACEMENT  04/01/2013   "1" (06/01/2013)  . CORONARY ARTERY BYPASS GRAFT N/A 10/22/2012   Procedure: CORONARY ARTERY BYPASS GRAFTING (CABG);  Surgeon: Rexene Alberts, MD;  Location: [place];  Service: Open Heart Surgery;  Laterality: N/A;  . DILATION AND CURETTAGE OF UTERUS    . INTRAOPERATIVE TRANSESOPHAGEAL ECHOCARDIOGRAM N/A 10/22/2012   Procedure: INTRAOPERATIVE TRANSESOPHAGEAL ECHOCARDIOGRAM;  Surgeon: Rexene Alberts, MD;  Location: [place];  Service: Open Heart Surgery;  Laterality: N/A;  . LEFT HEART CATHETERIZATION WITH CORONARY ANGIOGRAM N/A 10/16/2012   Procedure: LEFT HEART CATHETERIZATION WITH CORONARY ANGIOGRAM;  Surgeon: Sinclair Grooms, MD;  [facility] C[facility];  Service: Cardiovascular;  Laterality: N/A;  . LEFT  HEART CATHETERIZATION WITH CORONARY/GRAFT ANGIOGRAM N/A 06/01/2013   Procedure: LEFT HEART CATHETERIZATION WITH Beatrix Fetters;  Surgeon: Sinclair Grooms, MD;  Location: [facility] CATH LAB;  Service: Cardiovascular;  Laterality: N/A;  . MAZE N/A 10/22/2012   Procedure: MAZE;  Surgeon: Rexene Alberts, MD;  Location: [place];  Service: Open Heart Surgery;  Laterality: N/A;  . PERCUTANEOUS CORONARY STENT INTERVENTION (PCI-S)  06/01/2013   Procedure: PERCUTANEOUS CORONARY STENT INTERVENTION (PCI-S);  Surgeon: Sinclair Grooms, MD;  Location: [facility] CATH LAB;  Service: Cardiovascular;;  . TONSILLECTOMY  1949  . TOTAL ABDOMINAL HYSTERECTOMY W/ BILATERAL SALPINGOOPHORECTOMY  1990   benign growth    Social History   Social History  . Marital status: Widowed    Spouse name: N/A  . Number of children: 2  . Years of education: N/A   Occupational History  . retired     Corporate treasurer   Social History Main Topics  . Smoking status: Never Smoker  . Smokeless tobacco: Never Used  . Alcohol use 0.6 oz/week    1 Glasses of wine per week     Comment: 1 glass of wine 4 times/week  . Drug use: No  . Sexual activity: No   Other Topics Concern  . Not on  file   Social History Narrative   Widowed.  Lives alone.  Retired Corporate treasurer.  1 son in [place], 1 son in [place].   Great granddaughter (in [place])    Family History  Problem Relation Age of Onset  . Hypertension Mother   . Heart attack Mother 26  . Hypertension Brother   . Cancer Brother     prostate  . Heart disease Brother 85    stent  . Heart disease Other     x2 (age 78 and 58)  . Diabetes Neg Hx   . Breast cancer Neg Hx   . Colon cancer Neg Hx     Outpatient Encounter Prescriptions as of 09/06/2016  Medication Sig  . aspirin EC 81 MG tablet Take 1 tablet (81 mg total) by mouth daily.  Marland Kitchen atenolol (TENORMIN) 100 MG tablet TAKE 1 TABLET BY MOUTH EVERY DAY  . atorvastatin (LIPITOR) 10 MG tablet TAKE 1 TABLET BY MOUTH EVERY DAY  . benazepril (LOTENSIN) 40 MG  tablet Take 1 tablet (40 mg total) by mouth daily. *Patient is overdue for an appointment and must call and schedule for further refills*  . cholecalciferol (VITAMIN D) 1000 UNITS tablet Take 1,000 Units by mouth daily.  . Glucosamine-Chondroitin-Vit D3 1500-1200-800 MG-MG-UNIT PACK Take 1,200 capsules by mouth daily. Joint pain  . hydrochlorothiazide (HYDRODIURIL) 25 MG tablet Take 1 tablet (25 mg total) by mouth daily. Patient needs to keep 08/26/16 appt for further refills  . Multiple Vitamin (MULTIVITAMIN) tablet Take 1 tablet by mouth daily.  . furosemide (LASIX) 20 MG tablet Take 1 tablet (20 mg total) by mouth as needed for fluid or edema. Reported on 12/19/2015 (Patient not taking: Reported on 09/06/2016)   No facility-administered encounter medications on file as of 09/06/2016.     Allergies  Allergen Reactions  . Oxycodone Nausea And Vomiting    Extreme nausea and vomiting    ROS: The patient denies anorexia, fever, headaches, decreased hearing, ear pain, sore throat, breast concerns, palpitations, dizziness, syncope, dyspnea on exertion, cough, swelling, nausea, vomiting, diarrhea, constipation, abdominal pain, melena, hematochezia, indigestion/heartburn, hematuria, incontinence, dysuria, vaginal bleeding, discharge, odor or itch, genital lesions, joint pains, weakness, tremor, suspicious skin lesions, depression, anxiety, abnormal bleeding/bruising, or enlarged lymph nodes. +some trouble falling asleep; chamomille tea helps Skin has been itchy/dry (winter)--better when she uses lotion. She wears carpal tunnel brace on left wrist to sleep and no longer has any tingling/numbness in hands at night.  Just occasionally during the day. She reports no changes to the discoloration to the right side of her nose (previously biopsied/benign).    PHYSICAL EXAM:  Wt Readings from Last 3 Encounters:  09/05/16 134 lb 6.4 oz (61 kg)  12/19/15 133 lb 3.2 oz (60.4 kg)  07/27/15 131 lb 1.9 oz (59.5  kg)   BP 134/72 (BP Location: Right Arm, Patient Position: Sitting, Cuff Size: Normal)   Pulse 76   Ht 5' 5.25" (1.657 m)   Wt 130 lb 12.8 oz (59.3 kg)   BMI 21.60 kg/m   General Appearance:  Alert, cooperative, no distress, appears stated age   Head:  Normocephalic, without obvious abnormality, atraumatic   Eyes:  PERRL, conjunctiva/corneas clear, EOM's intact, fundi  benign   Ears:  Normal TM's and external ear canals   Nose:  Nares normal, mucosa is mildly edematous, white mucus noted on the left; no sinus tenderness   Throat:  Lips, mucosa, and tongue normal; teeth and gums normal   Neck:  Supple,  no lymphadenopathy; thyroid: no enlargement/tenderness/nodules; no carotid bruit or JVD   Back:  Spine nontender, no curvature, ROM normal, no CVA tenderness   Lungs:  Clear to auscultation bilaterally without wheezes, rales or ronchi; respirations unlabored   Chest Wall:  No tenderness or deformity. WHSS  Heart:  Regular rate and rhythm, S1 and S2 normal, no murmur, rub or gallop   Breast Exam:  No tenderness, masses, or nipple discharge or inversion. No axillary lymphadenopathy. WHSS left breast  Abdomen:  Soft, non-tender, nondistended, normoactive bowel sounds, no masses, no hepatosplenomegaly   Genitalia:  Normal external genitalia without lesions. BUS and vagina normal; normal bimanual exam without masses. Suspect that perhaps urethra drops some and this is what she periodically is feeling.  Rectal:  Normal tone, no masses or tenderness; heme negative brown stool  Extremities:  No clubbing, cyanosis or edema   Pulses:  2+ and symmetric all extremities   Skin:  Skin color, texture, turgor normal, no rashes or lesions. Large hyperpigmented macular area on the right side of her nose is mostly covered with makeup.  Smooth/flat (and unchanged, per pt)  Lymph nodes:  Cervical, supraclavicular, and axillary nodes normal   Neurologic:  CNII-XII intact,  normal strength, sensation and gait; reflexes 2+ and symmetric throughout   Psych:   Normal mood, affect, hygiene and grooming   ASSESSMENT/PLAN:  Medicare annual wellness visit, subsequent  Pure hypercholesterolemia - goal LDL<70, previously above goal. Some intolerance to atorvastatin limiting dose (and causing some missed doses). Check to see if at goal on current regimen - Plan: Lipid panel  Essential hypertension, benign - borderline; encouraged increased exercise and periodic monitoring - Plan: Comprehensive metabolic panel  Carotid artery obstruction, right - stable per last [place]  Coronary artery disease involving autologous vein coronary bypass graft without angina pectoris - stable, asymptomatic  Medication monitoring encounter - Plan: CBC with Differential/Platelet, Comprehensive metabolic panel, Lipid panel  S/P CABG x 3  S/P Maze operation for atrial fibrillation  Osteopenia, unspecified location - very mild; last check 5 years ago. Recommended recheck--she prefers to do at [facility] and same location/time as mammo (prior was at [facility]) - Plan: DG Bone Density  Postmenopausal estrogen deficiency - Plan: DG Bone Density   CBC, c-met, lipids  Adjust meds if LDL>70 Confirm now taking full '10mg'$  and repeat lipids Send results to Dr. Tamala Julian   Discussed monthly self breast exams and yearly mammogram; at least 30 minutes of aerobic activity at least 5 days/week, plus weight-bearing exercise at least 2x/week; proper sunscreen use reviewed; healthy diet, including goals of calcium and vitamin D intake and alcohol recommendations (less than or equal to 1 drink/day) reviewed; regular seatbelt use; changing batteries in smoke detectors. Immunization recommendations discussed. Continue yearly high dose flu shots. Shingrix recommended.  Colonoscopy recommendations reviewed--due 2019   Osteopenia--previously declined repeat of DEXA given that she states she  would not take any prescription medications if it was abnormal. We discussed new medications being available, and will discuss if indicated. If no significant change over the last 5 years (won't be able to directly compare, since changing imaging locations), then further f/u may not be needed.  MOST form reviewed and updated. She is Full Code, Full Care    Medicare Attestation I have personally reviewed: The patient's medical and social history Their use of alcohol, tobacco or illicit drugs Their current medications and supplements The patient's functional ability including ADLs,fall risks, home safety risks, cognitive, and hearing and visual impairment  Diet and physical activities Evidence for depression or mood disorders  The patient's weight, height, and BMI have been recorded in the chart.  I have made referrals, counseling, and provided education to the patient based on review of the above and I have provided the patient with a written personalized care plan for preventive services.     Marchia Diguglielmo A, MD   09/05/2016

## 2016-09-05 NOTE — Progress Notes (Signed)
Cardiology Office Note    Date:  09/05/2016   ID:  Claudia Howell, DOB 08/07/1940, MRN [id number]  PCP:  Vikki Ports, MD  Cardiologist: Sinclair Grooms, MD   Chief Complaint  Patient presents with  . Congestive Heart Failure    History of Present Illness:  Claudia Howell is a 76 y.o. female who presents for coronary artery disease with bypass graft failure, saphenous vein graft stent to obtuse marginal and known occlusion of vein graft to RCA, hypertension, acute on chronic systolic and diastolic heart failure, history of paroxysmal atrial fibrillation, maze procedure doing coronary bypass grafting, and essential hypertension.    She is doing well. She has not needed furosemide. She denies orthopnea, PND, exertional dyspnea, lower extremity edema, chest pain, palpitations, and syncope. Appetite is been stable. She has not needed nitroglycerin. No claudication symptoms. She has known   Past Medical History:  Diagnosis Date  . Adenomatous colon polyp 10/09  . Coronary artery disease    Last cath 12/14 Cath 12/8 100% SVG occlusion to RCA, 95% SVG.  The stenosis to OM.  LIMA to LAD patent but diffuse disease in LAD after graft.  Overlapping stents mid/distal SVG to OM.  Marland Kitchen Decubitus ulcer of coccyx 10/22/12   1/2 inch raw open area on coccyx  . Hyperlipidemia   . Hypertension    Dr.Kia Stavros Tamala Julian  . Osteopenia 9/09  . PAF (paroxysmal atrial fibrillation) ([place])    during cath/notes 10/16/2012  . S/P CABG x 3 10/22/2012   LIMA to LAD, SVG to distal RCA, SVG to OM, EVH from right thigh  . S/P Maze operation for atrial fibrillation 10/22/2012   Left side lesion set using bipolar radiofrequency and cryothermy ablation with clipping of LA appendage    Past Surgical History:  Procedure Laterality Date  . BREAST CYST EXCISION Left 1980-1990's   x 3  . CARDIAC CATHETERIZATION  10/16/2012  . CORONARY ANGIOPLASTY WITH STENT PLACEMENT  04/01/2013   "1" (06/01/2013)  . CORONARY ARTERY BYPASS  GRAFT N/A 10/22/2012   Procedure: CORONARY ARTERY BYPASS GRAFTING (CABG);  Surgeon: Rexene Alberts, MD;  Location: [place];  Service: Open Heart Surgery;  Laterality: N/A;  . DILATION AND CURETTAGE OF UTERUS    . INTRAOPERATIVE TRANSESOPHAGEAL ECHOCARDIOGRAM N/A 10/22/2012   Procedure: INTRAOPERATIVE TRANSESOPHAGEAL ECHOCARDIOGRAM;  Surgeon: Rexene Alberts, MD;  Location: [place];  Service: Open Heart Surgery;  Laterality: N/A;  . LEFT HEART CATHETERIZATION WITH CORONARY ANGIOGRAM N/A 10/16/2012   Procedure: LEFT HEART CATHETERIZATION WITH CORONARY ANGIOGRAM;  Surgeon: Sinclair Grooms, MD;  Location[facility] C[facility] LAB;  Service: Cardiovascular;  Laterality: N/A;  . LEFT HEART CATHETERIZATION WITH CORONARY/GRAFT ANGIOGRAM N/A 06/01/2013   Procedure: LEFT HEART CATHETERIZATION WITH Beatrix Fetters;  Surgeon: Sinclair Grooms, MD;  Location: [facility] CATH LAB;  Service: Cardiovascular;  Laterality: N/A;  . MAZE N/A 10/22/2012   Procedure: MAZE;  Surgeon: Rexene Alberts, MD;  Location: [place];  Service: Open Heart Surgery;  Laterality: N/A;  . PERCUTANEOUS CORONARY STENT INTERVENTION (PCI-S)  06/01/2013   Procedure: PERCUTANEOUS CORONARY STENT INTERVENTION (PCI-S);  Surgeon: Sinclair Grooms, MD;  Location: [facility] CATH LAB;  Service: Cardiovascular;;  . TONSILLECTOMY  1949  . TOTAL ABDOMINAL HYSTERECTOMY W/ BILATERAL SALPINGOOPHORECTOMY  1990   benign growth    Current Medications: Outpatient Medications Prior to Visit  Medication Sig Dispense Refill  . aspirin EC 81 MG tablet Take 1 tablet (81 mg total) by  mouth daily.    Marland Kitchen atenolol (TENORMIN) 100 MG tablet TAKE 1 TABLET BY MOUTH EVERY DAY 90 tablet 0  . atorvastatin (LIPITOR) 10 MG tablet TAKE 1 TABLET BY MOUTH EVERY DAY 90 tablet 0  . benazepril (LOTENSIN) 40 MG tablet Take 1 tablet (40 mg total) by mouth daily. *Patient is overdue for an appointment and must call and schedule for further refills* 30 tablet 0  . cholecalciferol (VITAMIN D) 1000 UNITS  tablet Take 1,000 Units by mouth daily.    . furosemide (LASIX) 20 MG tablet Reported on 12/19/2015  4  . Glucosamine-Chondroitin-Vit D3 1500-1200-800 MG-MG-UNIT PACK Take 1,200 capsules by mouth daily. Joint pain    . hydrochlorothiazide (HYDRODIURIL) 25 MG tablet Take 1 tablet (25 mg total) by mouth daily. Patient needs to keep 08/26/16 appt for further refills 90 tablet 3  . Multiple Vitamin (MULTIVITAMIN) tablet Take 1 tablet by mouth daily.     No facility-administered medications prior to visit.      Allergies:   Oxycodone   Social History   Social History  . Marital status: Widowed    Spouse name: N/A  . Number of children: 2  . Years of education: N/A   Occupational History  . retired     Corporate treasurer   Social History Main Topics  . Smoking status: Never Smoker  . Smokeless tobacco: Never Used  . Alcohol use 0.6 oz/week    1 Glasses of wine per week     Comment: 1 glass of wine 4 times/week  . Drug use: No  . Sexual activity: No   Other Topics Concern  . None   Social History Narrative   Widowed.  Lives alone.  Retired Corporate treasurer.  1 son in [place], 1 son in [place].   Great granddaughter (in [place])     Family History:  The patient's family history includes Cancer in her brother; Heart attack (age of onset: 32) in her mother; Heart disease in her brother and other; Hypertension in her brother and mother.   ROS:   Please see the history of present illness.    Appetite is been stable. Weight is been stable. Sleep has been somewhat difficult.  All other systems reviewed and are negative.   PHYSICAL EXAM:   VS:  BP 140/70   Pulse 69   Ht 5\' 6"  (1.676 m)   Wt 134 lb 6.4 oz (61 kg)   SpO2 99%   BMI 21.69 kg/m    GEN: Well nourished, well developed, in no acute distress  HEENT: normal  Neck: no JVD, carotid bruits, or masses Cardiac: RRR; no murmurs, rubs, or gallops,no edema  Respiratory:  clear to auscultation bilaterally, normal work of breathing GI: soft, nontender, nondistended,  + BS MS: no deformity or atrophy  Skin: warm and dry, no rash Neuro:  Alert and Oriented x 3, Strength and sensation are intact Psych: euthymic mood, full affect  Wt Readings from Last 3 Encounters:  09/05/16 134 lb 6.4 oz (61 kg)  12/19/15 133 lb 3.2 oz (60.4 kg)  07/27/15 131 lb 1.9 oz (59.5 kg)      Studies/Labs Reviewed:   EKG:  EKG  EKG reveals normal sinus rhythm and overall normal appearance. Compared to prior no change has occurred.  Recent Labs: 12/19/2015: ALT 11; BUN 18; Creat 0.73; Potassium 3.6; Sodium 138; TSH 1.10   Lipid Panel    Component Value Date/Time   CHOL 180 12/19/2015 1025   TRIG 207 (H) 12/19/2015 1025  HDL 46 12/19/2015 1025   CHOLHDL 3.9 12/19/2015 1025   VLDL 41 (H) 12/19/2015 1025   LDLCALC 93 12/19/2015 1025    Additional studies/ records that were reviewed today include:  No new functional or imaging data.    ASSESSMENT:    1. Chronic diastolic heart failure ([place])   2. Coronary artery disease involving autologous vein coronary bypass graft with angina pectoris with documented spasm ([place])   3. Obstruction of carotid artery, unspecified laterality   4. Essential hypertension, benign   5. S/P CABG x 3   6. S/P Maze operation for atrial fibrillation   7. Other hyperlipidemia      PLAN:  In order of problems listed above:  1. No evidence of volume overload. Furosemide is being used on an as-needed basis. Prescription is refilled. 2. No angina. Known bypass graft occlusion. She is active without symptoms. Plans clinical observation. 3. Bilateral less than 50% carotid plaque. Clinical follow-up. Consider repeat Doppler study in one year. 4. Blood pressures well controlled on the current regimen. Low salt diet. 5. Bypass graft failure with known occlusion of SVG to RCA. RCA is collateralized. Saphenous vein graft to the obtuse marginal has been drug-eluting stent and she is asymptomatic. 6. No palpitations or recurrence of atrial  fibrillation since surgical maze. 7. Followed by her primary physician with target LDL discussed and verified to be less than 70. I will receive information from Dr. Tomi Bamberger. Last LDL was 93 in June 2017.  Clinical follow-up in one year. Call prior if symptoms suggesting angina, heart failure, or arrhythmia.  Medication Adjustments/Labs and Tests Ordered: Current medicines are reviewed at length with the patient today.  Concerns regarding medicines are outlined above.  Medication changes, Labs and Tests ordered today are listed in the Patient Instructions below. There are no Patient Instructions on file for this visit.   Signed, Sinclair Grooms, MD  09/05/2016 4:26 PM    [facility] [place], [place], [place]  [address] Phone: [phone]; Fax: [phone]

## 2016-09-06 ENCOUNTER — Encounter: Payer: Self-pay | Admitting: Family Medicine

## 2016-09-06 ENCOUNTER — Ambulatory Visit (INDEPENDENT_AMBULATORY_CARE_PROVIDER_SITE_OTHER): Payer: Medicare Other | Admitting: Family Medicine

## 2016-09-06 VITALS — BP 134/72 | HR 76 | Ht 65.25 in | Wt 130.8 lb

## 2016-09-06 DIAGNOSIS — I10 Essential (primary) hypertension: Secondary | ICD-10-CM

## 2016-09-06 DIAGNOSIS — Z5181 Encounter for therapeutic drug level monitoring: Secondary | ICD-10-CM

## 2016-09-06 DIAGNOSIS — I25810 Atherosclerosis of coronary artery bypass graft(s) without angina pectoris: Secondary | ICD-10-CM | POA: Diagnosis not present

## 2016-09-06 DIAGNOSIS — I6521 Occlusion and stenosis of right carotid artery: Secondary | ICD-10-CM | POA: Diagnosis not present

## 2016-09-06 DIAGNOSIS — Z8679 Personal history of other diseases of the circulatory system: Secondary | ICD-10-CM

## 2016-09-06 DIAGNOSIS — Z9889 Other specified postprocedural states: Secondary | ICD-10-CM

## 2016-09-06 DIAGNOSIS — Z780 Asymptomatic menopausal state: Secondary | ICD-10-CM

## 2016-09-06 DIAGNOSIS — M8580 Other specified disorders of bone density and structure, unspecified site: Secondary | ICD-10-CM | POA: Diagnosis not present

## 2016-09-06 DIAGNOSIS — E7800 Pure hypercholesterolemia, unspecified: Secondary | ICD-10-CM | POA: Diagnosis not present

## 2016-09-06 DIAGNOSIS — Z0000 Encounter for general adult medical examination without abnormal findings: Secondary | ICD-10-CM | POA: Diagnosis not present

## 2016-09-06 DIAGNOSIS — Z951 Presence of aortocoronary bypass graft: Secondary | ICD-10-CM

## 2016-09-06 LAB — CBC WITH DIFFERENTIAL/PLATELET
Basophils Absolute: 82 cells/uL (ref 0–200)
Basophils Relative: 1 %
Eosinophils Absolute: 246 cells/uL (ref 15–500)
Eosinophils Relative: 3 %
HCT: 39.7 % (ref 35.0–45.0)
Hemoglobin: 13.4 g/dL (ref 11.7–15.5)
Lymphocytes Relative: 26 %
Lymphs Abs: 2132 cells/uL (ref 850–3900)
MCH: 31.5 pg (ref 27.0–33.0)
MCHC: 33.8 g/dL (ref 32.0–36.0)
MCV: 93.4 fL (ref 80.0–100.0)
MPV: 8.9 fL (ref 7.5–12.5)
Monocytes Absolute: 738 cells/uL (ref 200–950)
Monocytes Relative: 9 %
Neutro Abs: 5002 cells/uL (ref 1500–7800)
Neutrophils Relative %: 61 %
Platelets: 213 10*3/uL (ref 140–400)
RBC: 4.25 MIL/uL (ref 3.80–5.10)
RDW: 13 % (ref 11.0–15.0)
WBC: 8.2 10*3/uL (ref 4.0–10.5)

## 2016-09-06 LAB — LIPID PANEL
Cholesterol: 190 mg/dL (ref <200)
HDL: 56 mg/dL (ref >50)
LDL Cholesterol: 106 mg/dL — ABNORMAL HIGH (ref <100)
Total CHOL/HDL Ratio: 3.4 Ratio (ref <5.0)
Triglycerides: 139 mg/dL (ref <150)
VLDL: 28 mg/dL (ref <30)

## 2016-09-06 LAB — COMPREHENSIVE METABOLIC PANEL
ALT: 14 U/L (ref 6–29)
AST: 20 U/L (ref 10–35)
Albumin: 4.4 g/dL (ref 3.6–5.1)
Alkaline Phosphatase: 42 U/L (ref 33–130)
BUN: 25 mg/dL (ref 7–25)
CO2: 26 mmol/L (ref 20–31)
Calcium: 9.7 mg/dL (ref 8.6–10.4)
Chloride: 98 mmol/L (ref 98–110)
Creat: 1.06 mg/dL — ABNORMAL HIGH (ref 0.60–0.93)
Glucose, Bld: 93 mg/dL (ref 65–99)
Potassium: 3.4 mmol/L — ABNORMAL LOW (ref 3.5–5.3)
Sodium: 135 mmol/L (ref 135–146)
Total Bilirubin: 0.6 mg/dL (ref 0.2–1.2)
Total Protein: 7.6 g/dL (ref 6.1–8.1)

## 2016-09-06 NOTE — Patient Instructions (Addendum)
  HEALTH MAINTENANCE RECOMMENDATIONS:  It is recommended that you get at least 30 minutes of aerobic exercise at least 5 days/week (for weight loss, you may need as much as 60-90 minutes). This can be any activity that gets your heart rate up. This can be divided in 10-15 minute intervals if needed, but try and build up your endurance at least once a week.  Weight bearing exercise is also recommended twice weekly.  Eat a healthy diet with lots of vegetables, fruits and fiber.  "Colorful" foods have a lot of vitamins (ie green vegetables, tomatoes, red peppers, etc).  Limit sweet tea, regular sodas and alcoholic beverages, all of which has a lot of calories and sugar.  Up to 1 alcoholic drink daily may be beneficial for women (unless trying to lose weight, watch sugars).  Drink a lot of water.  Calcium recommendations are 1200-1500 mg daily (1500 mg for postmenopausal women or women without ovaries), and vitamin D 1000 IU daily.  This should be obtained from diet and/or supplements (vitamins), and calcium should not be taken all at once, but in divided doses.  Monthly self breast exams and yearly mammograms for women over the age of 76 is recommended.  Sunscreen of at least SPF 30 should be used on all sun-exposed parts of the skin when outside between the hours of 10 am and 4 pm (not just when at beach or pool, but even with exercise, golf, tennis, and yard work!)  Use a sunscreen that says "broad spectrum" so it covers both UVA and UVB rays, and make sure to reapply every 1-2 hours.  Remember to change the batteries in your smoke detectors when changing your clock times in the spring and fall.  Use your seat belt every time you are in a car, and please drive safely and not be distracted with cell phones and texting while driving.   Claudia Howell , Thank you for taking time to come for your Medicare Wellness Visit. I appreciate your ongoing commitment to your health goals. Please review the following  plan we discussed and let me know if I can assist you in the future.   These are the goals we discussed: Goals    None      This is a list of the screening recommended for you and due dates:  Health Maintenance  Topic Date Due  . Mammogram  10/10/2017  . Colon Cancer Screening  10/28/2017  . Tetanus Vaccine  01/24/2019  . Flu Shot  Completed  . DEXA scan (bone density measurement)  Completed  . Pneumonia vaccines  Completed   I recommend yearly mammograms, due in April Please schedule this, along with a follow-up bone density.  If there has been no decline since 5 years ago, we may not need to continue to monitor the bone density routinely. Please call [facility] to schedule both tests.   I recommend getting the new shingles vaccine (Shingrix) when available. You will need to check with your insurance to see if it is covered, and if covered by Medicare Part D, you need to get from the pharmacy rather than our office.  It is a series of 2 injections, spaced 2 months apart.

## 2016-09-17 ENCOUNTER — Other Ambulatory Visit: Payer: Self-pay | Admitting: *Deleted

## 2016-09-17 ENCOUNTER — Encounter: Payer: Self-pay | Admitting: Family Medicine

## 2016-09-17 DIAGNOSIS — E782 Mixed hyperlipidemia: Secondary | ICD-10-CM

## 2016-09-17 MED ORDER — EZETIMIBE 10 MG PO TABS: 10.0000 mg | ORAL_TABLET | Freq: Every day | ORAL | 0 refills | Status: DC

## 2016-09-17 MED ORDER — POTASSIUM CHLORIDE CRYS ER 10 MEQ PO TBCR: 10.0000 meq | EXTENDED_RELEASE_TABLET | Freq: Every day | ORAL | 0 refills | Status: DC

## 2016-09-27 ENCOUNTER — Telehealth: Payer: Self-pay | Admitting: *Deleted

## 2016-09-27 NOTE — Telephone Encounter (Signed)
Will route to Dr. Smith for review and advisement.  

## 2016-09-27 NOTE — Telephone Encounter (Signed)
There is not

## 2016-09-27 NOTE — Telephone Encounter (Signed)
Patient calling to see if there is another medication she can take due to the Zetia being costly.  See Patient Email notes from 3/26 that were originally sent to Dr. Tomi Bamberger for further explanation.

## 2016-09-28 NOTE — Telephone Encounter (Signed)
Would you like to call patient back?

## 2016-09-28 NOTE — Telephone Encounter (Signed)
Spoke with pt and made her aware of information provided by Dr. Tamala Julian.  Advised pt to also call Dr. Johnsie Kindred office as they were the office that prescribed medication and follow up labs that way if labs needed to be cancelled, the could.  Pt thought Dr. Tamala Julian prescribed medication.  Advised he had made the recommendation to Dr. Tomi Bamberger but Dr. Tomi Bamberger was the prescriber.  Pt verbalized understanding and was appreciative for call.

## 2016-09-30 ENCOUNTER — Other Ambulatory Visit: Payer: Self-pay | Admitting: Interventional Cardiology

## 2016-10-31 DIAGNOSIS — H34811 Central retinal vein occlusion, right eye, with macular edema: Secondary | ICD-10-CM | POA: Diagnosis not present

## 2016-10-31 DIAGNOSIS — H34231 Retinal artery branch occlusion, right eye: Secondary | ICD-10-CM | POA: Diagnosis not present

## 2016-11-04 ENCOUNTER — Other Ambulatory Visit: Payer: Self-pay | Admitting: Interventional Cardiology

## 2016-11-25 ENCOUNTER — Other Ambulatory Visit: Payer: Self-pay | Admitting: Family Medicine

## 2016-11-28 ENCOUNTER — Other Ambulatory Visit: Payer: Self-pay

## 2016-12-29 ENCOUNTER — Other Ambulatory Visit: Payer: Self-pay | Admitting: Interventional Cardiology

## 2017-01-09 DIAGNOSIS — H34811 Central retinal vein occlusion, right eye, with macular edema: Secondary | ICD-10-CM | POA: Diagnosis not present

## 2017-01-09 DIAGNOSIS — H35041 Retinal micro-aneurysms, unspecified, right eye: Secondary | ICD-10-CM | POA: Diagnosis not present

## 2017-01-09 DIAGNOSIS — H3561 Retinal hemorrhage, right eye: Secondary | ICD-10-CM | POA: Diagnosis not present

## 2017-01-09 DIAGNOSIS — H34231 Retinal artery branch occlusion, right eye: Secondary | ICD-10-CM | POA: Diagnosis not present

## 2017-01-09 DIAGNOSIS — H43821 Vitreomacular adhesion, right eye: Secondary | ICD-10-CM | POA: Diagnosis not present

## 2017-03-18 ENCOUNTER — Encounter: Payer: Self-pay | Admitting: *Deleted

## 2017-03-20 DIAGNOSIS — H35041 Retinal micro-aneurysms, unspecified, right eye: Secondary | ICD-10-CM | POA: Diagnosis not present

## 2017-03-20 DIAGNOSIS — H34231 Retinal artery branch occlusion, right eye: Secondary | ICD-10-CM | POA: Diagnosis not present

## 2017-03-20 DIAGNOSIS — H43821 Vitreomacular adhesion, right eye: Secondary | ICD-10-CM | POA: Diagnosis not present

## 2017-03-20 DIAGNOSIS — H348112 Central retinal vein occlusion, right eye, stable: Secondary | ICD-10-CM | POA: Diagnosis not present

## 2017-03-27 ENCOUNTER — Other Ambulatory Visit (INDEPENDENT_AMBULATORY_CARE_PROVIDER_SITE_OTHER): Payer: Medicare Other

## 2017-03-27 DIAGNOSIS — Z23 Encounter for immunization: Secondary | ICD-10-CM | POA: Diagnosis not present

## 2017-04-01 IMAGING — DX DG CHEST 2V
2 series · 2 of 2 positions shown · non-contrast
Comparison: Chest x-rays earlier today 5822 hr and previously.

CLINICAL DATA: 74-year-old with 1 week history of progressive
worsening cough. Prior CABG and Maze procedure.

EXAM:
CHEST  2 VIEW

[chest pa]
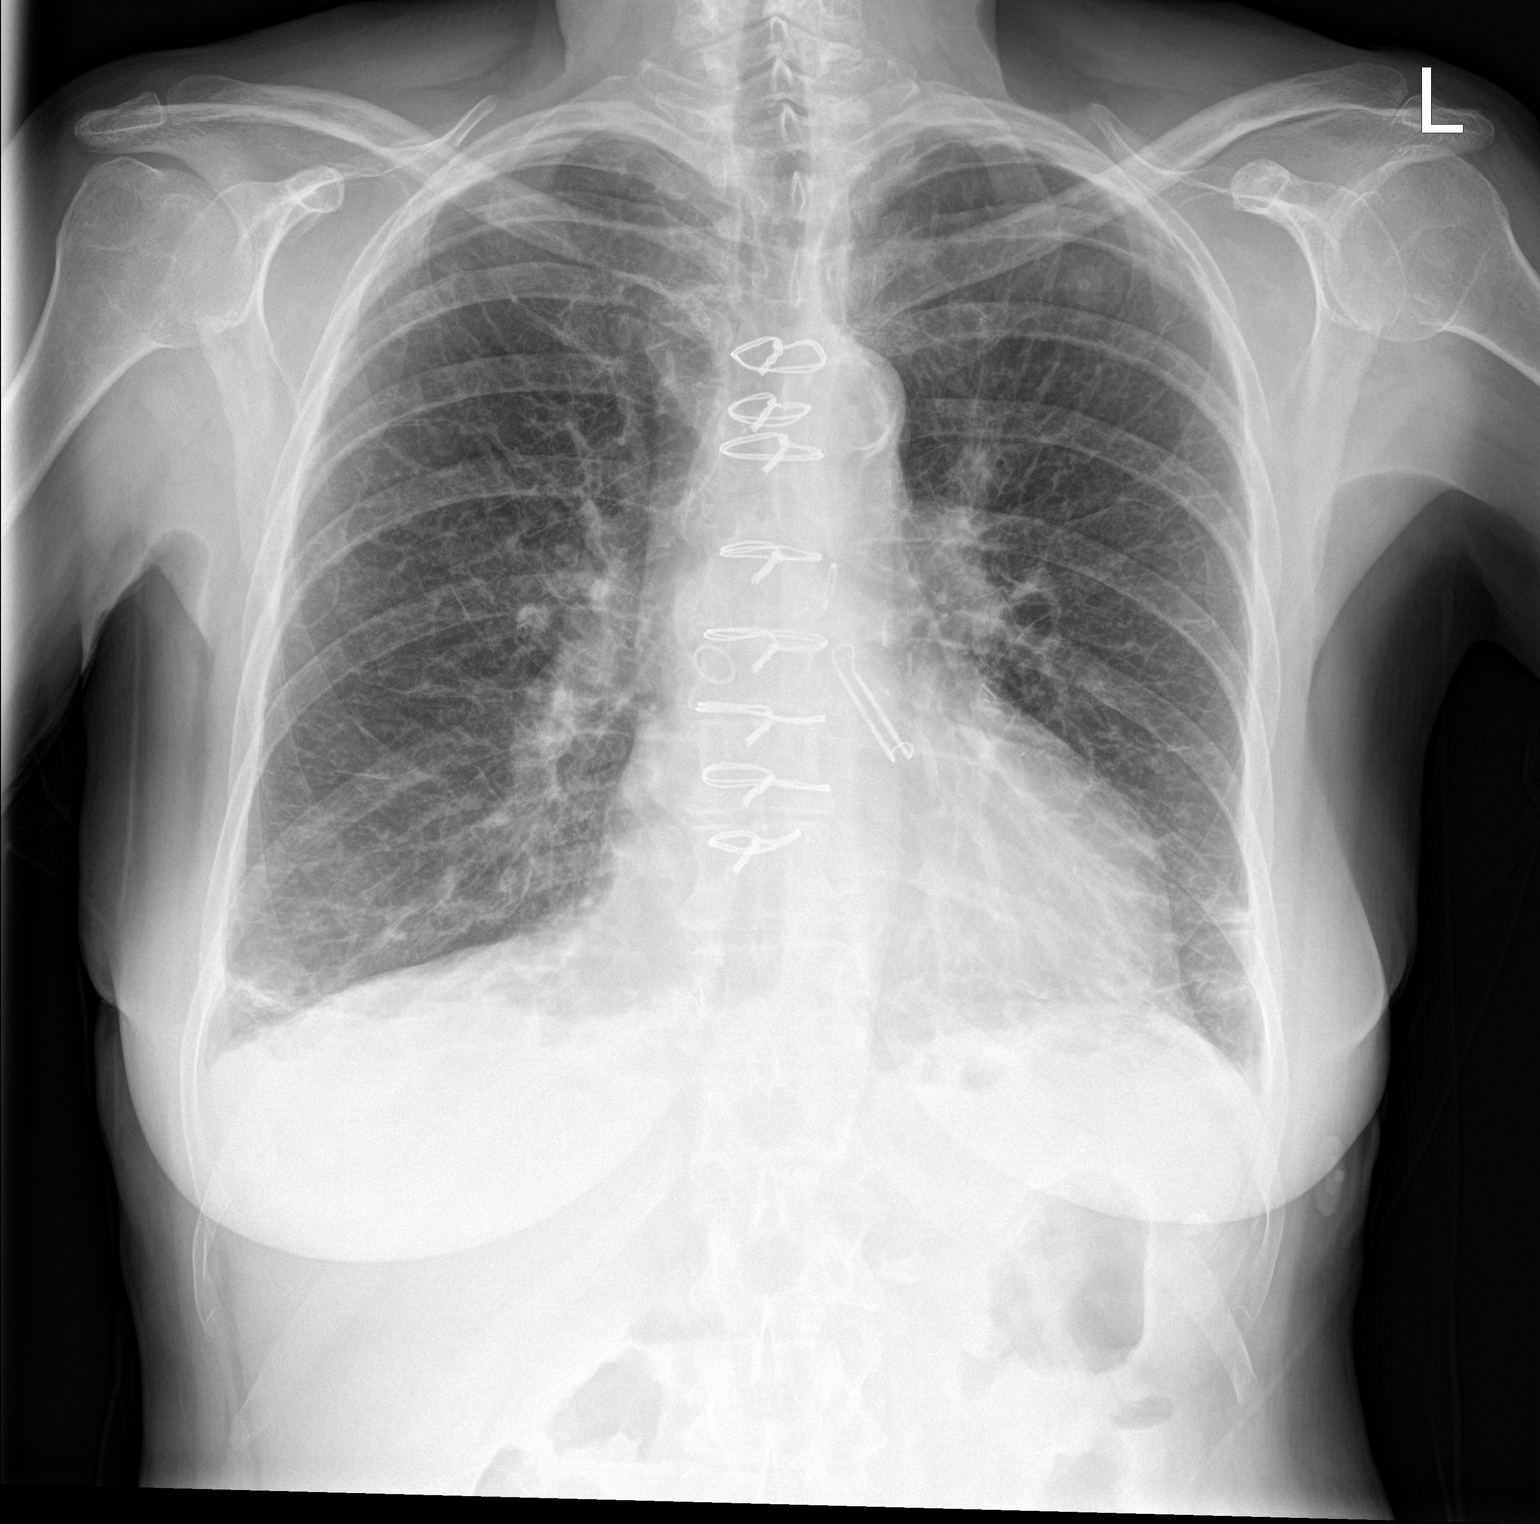

[chest lat]
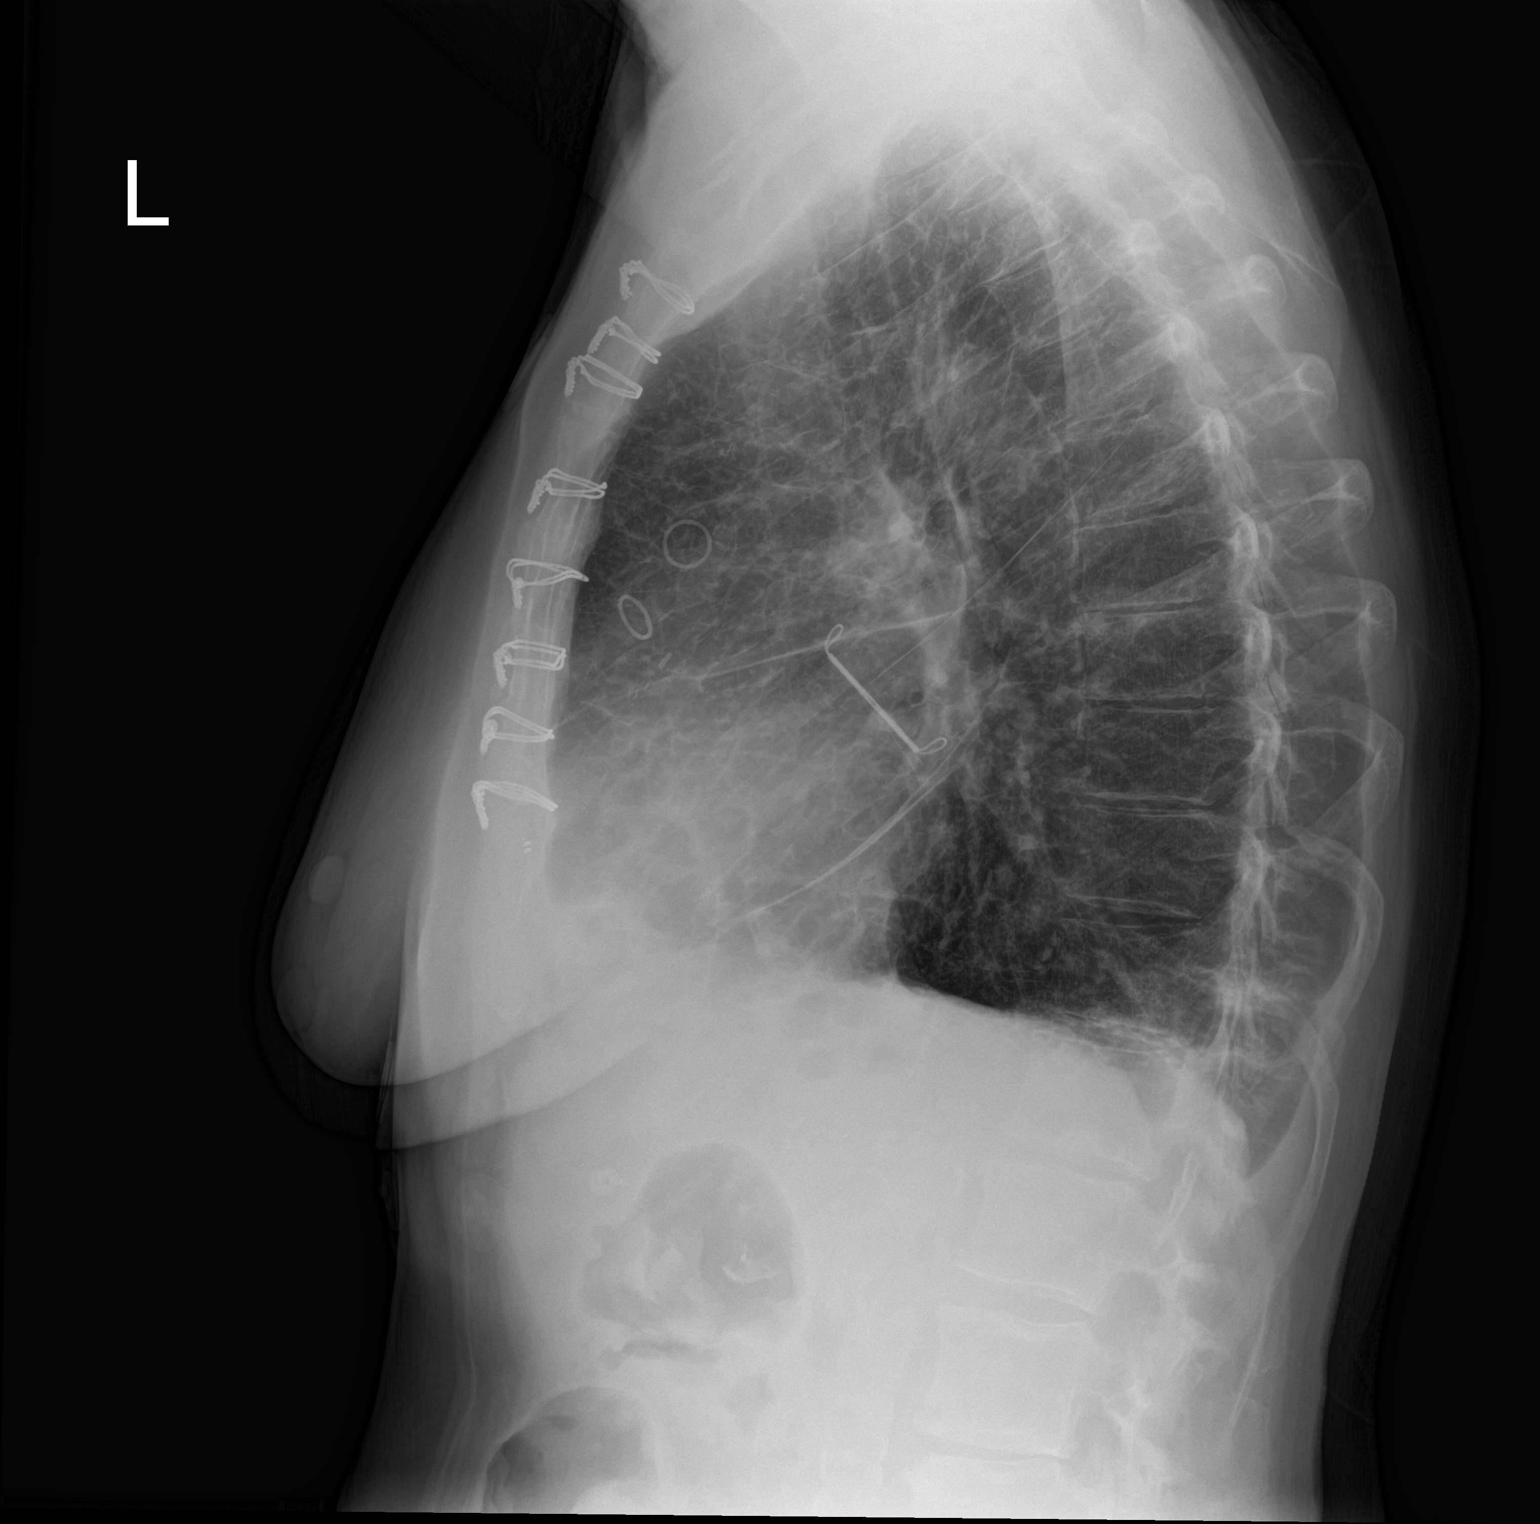

[2 of 2 positions shown; findings below may reference images not displayed]

FINDINGS: Prior sternotomy for CABG and Maze. Cardiac silhouette mildly
enlarged, unchanged. Moderate diffuse interstitial pulmonary edema,
more so than on the prior examination earlier today 5822 hr. Small
bilateral pleural effusions, unchanged since earlier today. No
confluent airspace consolidation. Visualized bony thorax intact.
IMPRESSION: Worsening interstitial pulmonary edema since the examination earlier
today 5822 hr, indicating CHF. Stable small bilateral pleural
effusions.

## 2017-04-01 IMAGING — CR DG CHEST 2V
2 series · 2 of 2 positions shown · non-contrast
Comparison: 11/24/2012

CLINICAL DATA: Productive cough with fever for 3 days.

EXAM:
CHEST  2 VIEW

[view not recorded (1 of 2)]
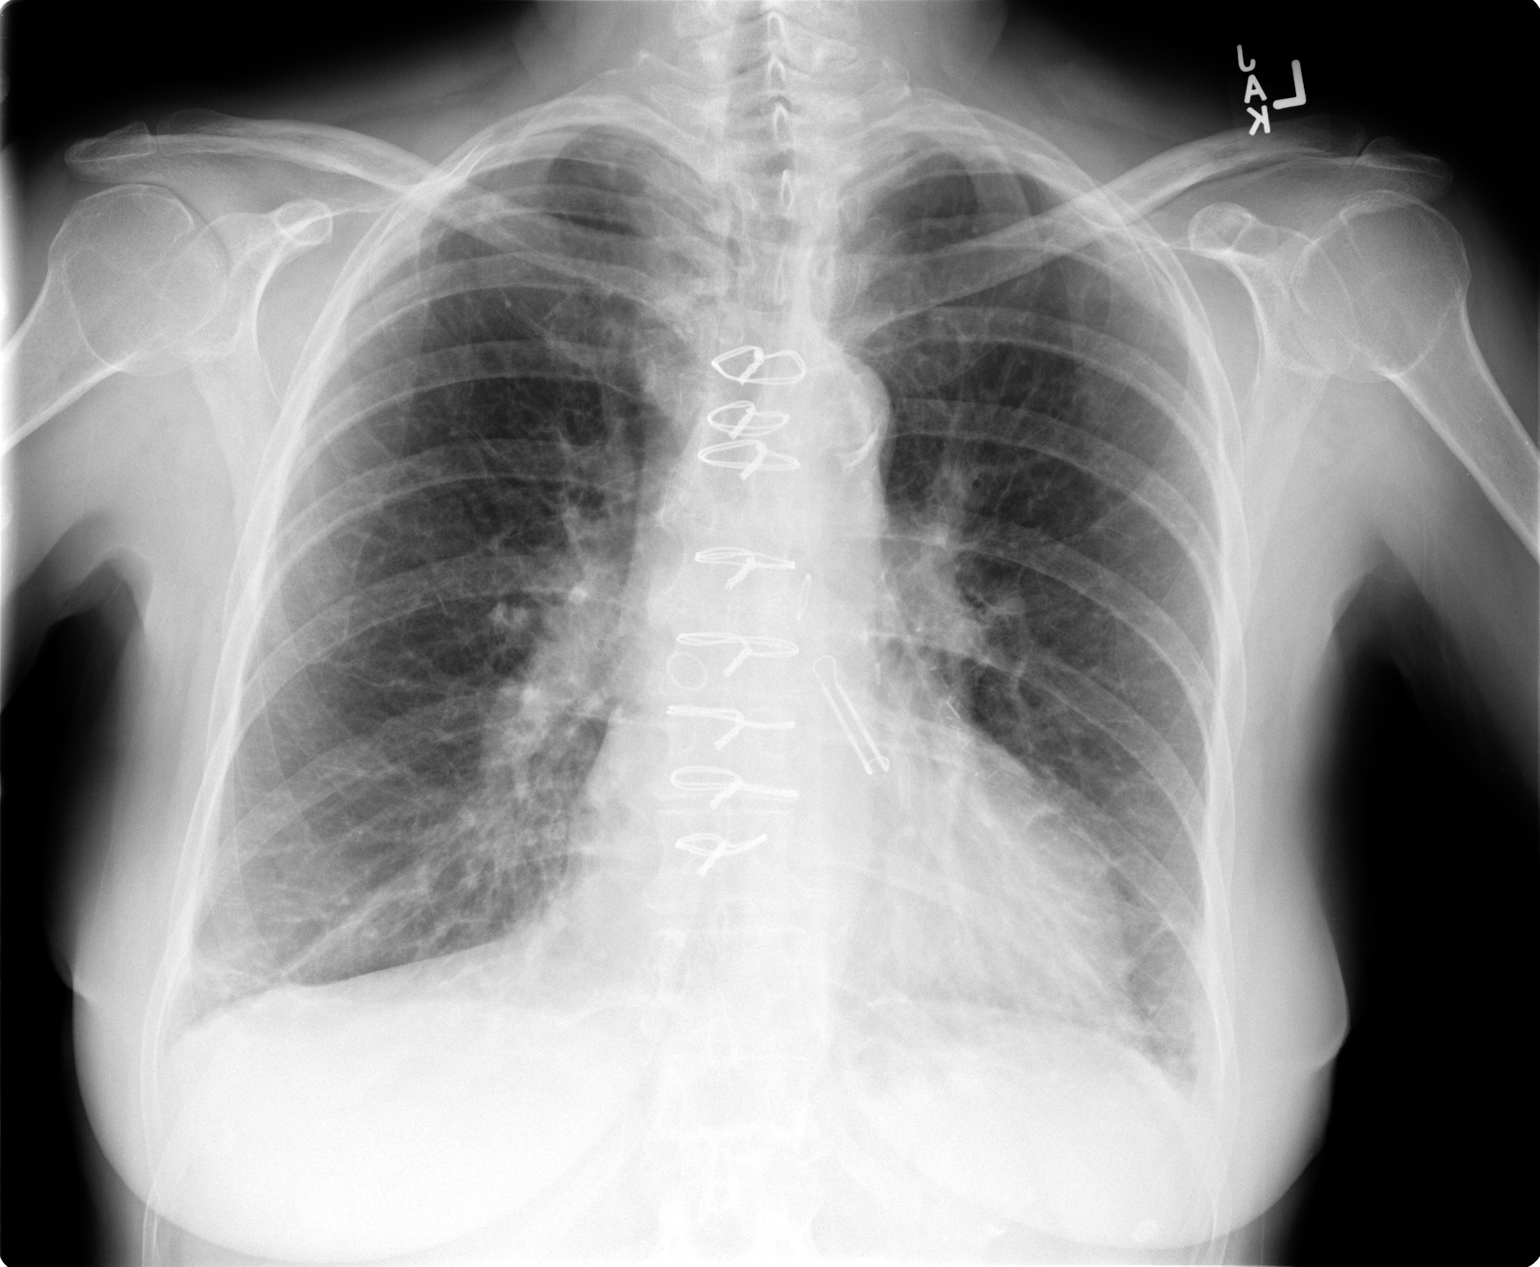

[view not recorded (2 of 2)]
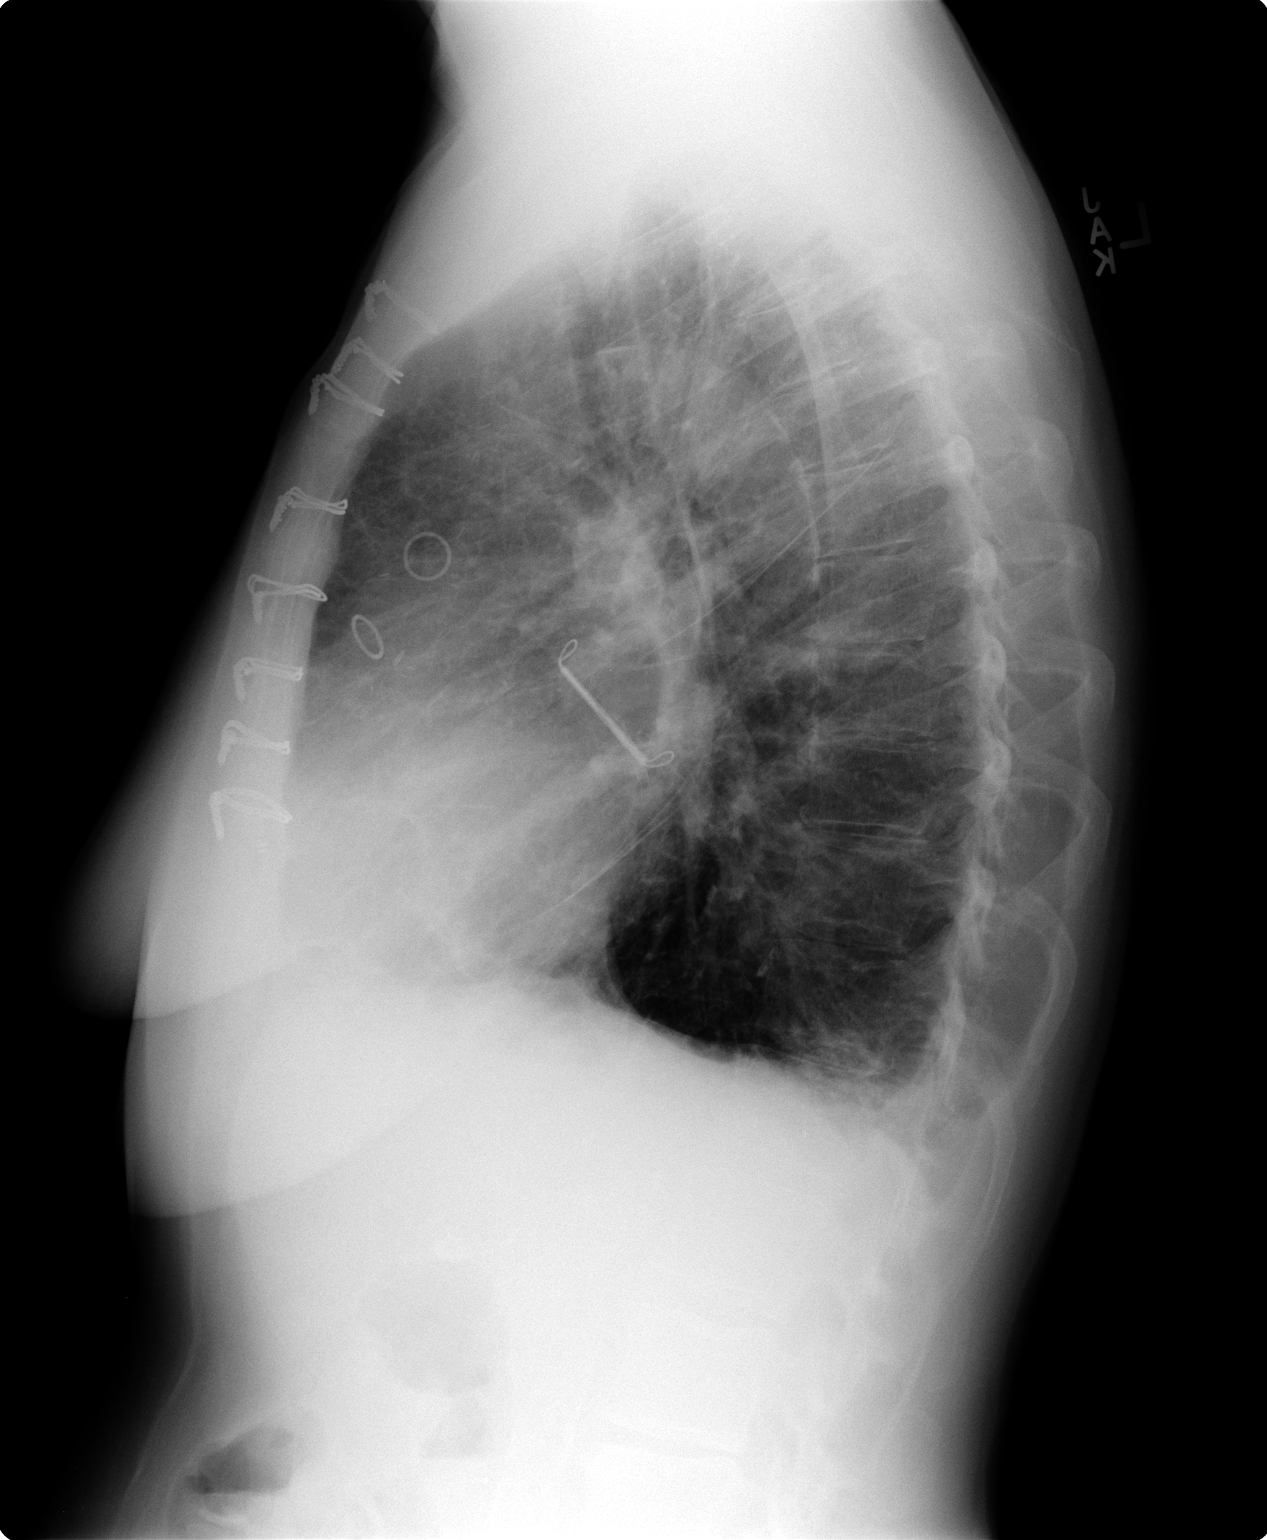

[2 of 2 positions shown; findings below may reference images not displayed]

FINDINGS: Sequelae of prior CABG and left atrial appendage clipping are again
identified. Cardiac silhouette is upper limits of normal in size,
unchanged. Thoracic aortic calcification is noted. The lungs are
well inflated. There are mildly increased interstitial densities
bilaterally, greatest at the lung bases. No segmental airspace
consolidation or pneumothorax is identified. There is minimal
blunting of the costophrenic angles when compared to the prior
study, and trace pleural effusions are not excluded. No acute
osseous abnormality is seen.
IMPRESSION: Increased interstitial densities greatest at the bases which may
reflect mild interstitial edema, atelectasis, or atypical infection.

## 2017-04-17 DIAGNOSIS — H35041 Retinal micro-aneurysms, unspecified, right eye: Secondary | ICD-10-CM | POA: Diagnosis not present

## 2017-04-17 DIAGNOSIS — H34231 Retinal artery branch occlusion, right eye: Secondary | ICD-10-CM | POA: Diagnosis not present

## 2017-04-17 DIAGNOSIS — H43821 Vitreomacular adhesion, right eye: Secondary | ICD-10-CM | POA: Diagnosis not present

## 2017-04-17 DIAGNOSIS — H3561 Retinal hemorrhage, right eye: Secondary | ICD-10-CM | POA: Diagnosis not present

## 2017-04-17 DIAGNOSIS — H348112 Central retinal vein occlusion, right eye, stable: Secondary | ICD-10-CM | POA: Diagnosis not present

## 2017-07-08 ENCOUNTER — Other Ambulatory Visit: Payer: Self-pay | Admitting: Interventional Cardiology

## 2017-08-07 DIAGNOSIS — H43821 Vitreomacular adhesion, right eye: Secondary | ICD-10-CM | POA: Diagnosis not present

## 2017-08-07 DIAGNOSIS — H348112 Central retinal vein occlusion, right eye, stable: Secondary | ICD-10-CM | POA: Diagnosis not present

## 2017-08-07 DIAGNOSIS — H34231 Retinal artery branch occlusion, right eye: Secondary | ICD-10-CM | POA: Diagnosis not present

## 2017-08-07 DIAGNOSIS — H35041 Retinal micro-aneurysms, unspecified, right eye: Secondary | ICD-10-CM | POA: Diagnosis not present

## 2017-08-14 DIAGNOSIS — H348111 Central retinal vein occlusion, right eye, with retinal neovascularization: Secondary | ICD-10-CM | POA: Diagnosis not present

## 2017-08-15 ENCOUNTER — Ambulatory Visit (INDEPENDENT_AMBULATORY_CARE_PROVIDER_SITE_OTHER): Payer: Medicare Other | Admitting: Interventional Cardiology

## 2017-08-15 ENCOUNTER — Encounter: Payer: Self-pay | Admitting: Interventional Cardiology

## 2017-08-15 VITALS — BP 140/70 | HR 65 | Ht 66.0 in | Wt 137.0 lb

## 2017-08-15 DIAGNOSIS — Z8679 Personal history of other diseases of the circulatory system: Secondary | ICD-10-CM | POA: Diagnosis not present

## 2017-08-15 DIAGNOSIS — I5032 Chronic diastolic (congestive) heart failure: Secondary | ICD-10-CM

## 2017-08-15 DIAGNOSIS — I10 Essential (primary) hypertension: Secondary | ICD-10-CM | POA: Diagnosis not present

## 2017-08-15 DIAGNOSIS — Z9889 Other specified postprocedural states: Secondary | ICD-10-CM | POA: Diagnosis not present

## 2017-08-15 DIAGNOSIS — I6529 Occlusion and stenosis of unspecified carotid artery: Secondary | ICD-10-CM

## 2017-08-15 DIAGNOSIS — I25718 Atherosclerosis of autologous vein coronary artery bypass graft(s) with other forms of angina pectoris: Secondary | ICD-10-CM | POA: Diagnosis not present

## 2017-08-15 DIAGNOSIS — I480 Paroxysmal atrial fibrillation: Secondary | ICD-10-CM | POA: Diagnosis not present

## 2017-08-15 DIAGNOSIS — E7849 Other hyperlipidemia: Secondary | ICD-10-CM

## 2017-08-15 NOTE — Patient Instructions (Signed)
Medication Instructions:  Your physician recommends that you continue on your current medications as directed. Please refer to the Current Medication list given to you today.  Labwork: None  Testing/Procedures: None  Follow-Up: Your physician wants you to follow-up in: 1 year with Dr. Tamala Julian. You will receive a reminder letter in the mail two months in advance. If you don't receive a letter, please call our office to schedule the follow-up appointment.   Any Other Special Instructions Will Be Listed Below (If Applicable).  Please give our office a call if you decide to move forward with our Lipid Clinic to see about cholesterol lowering options.   If you need a refill on your cardiac medications before your next appointment, please call your pharmacy.

## 2017-08-15 NOTE — Progress Notes (Signed)
Cardiology Office Note    Date:  08/15/2017   ID:  Claudia, Howell 1941/04/18, MRN [id number]  PCP:  Rita Ohara, MD  Cardiologist: Sinclair Grooms, MD   Chief Complaint  Patient presents with  . Coronary Artery Disease  . Atrial Fibrillation    History of Present Illness:  Claudia Howell is a 77 y.o. female who presents for coronary artery disease with bypass graft failure, saphenous vein graft stent, hypertension, acute on chronic systolic and diastolic heart failure, history of paroxysmal atrial fibrillation, maze procedure doing coronary bypass grafting, and essential hypertension.   It has been a quiet year for Claudia Howell.  She denies any episodes of angina.  She has not needed nitroglycerin.  She is physically active but states she does not have as much endurance/energy as she once did.  She occasionally takes a nap during the day.  She denies edema.  She denies orthopnea.  She is compliant with her medication regimen.  She has statin relative intolerance and takes only 10 mg of Lipitor every other day.    Past Medical History:  Diagnosis Date  . Adenomatous colon polyp 10/09  . Coronary artery disease    Last cath 12/14 Cath 12/8 100% SVG occlusion to RCA, 95% SVG.  The stenosis to OM.  LIMA to LAD patent but diffuse disease in LAD after graft.  Overlapping stents mid/distal SVG to OM.  Marland Kitchen Decubitus ulcer of coccyx 10/22/12   1/2 inch raw open area on coccyx  . Hyperlipidemia   . Hypertension    Dr.Arnett Galindez Tamala Julian  . Osteopenia 9/09  . PAF (paroxysmal atrial fibrillation) ([place])    during cath/notes 10/16/2012  . S/P CABG x 3 10/22/2012   LIMA to LAD, SVG to distal RCA, SVG to OM, EVH from right thigh  . S/P Maze operation for atrial fibrillation 10/22/2012   Left side lesion set using bipolar radiofrequency and cryothermy ablation with clipping of LA appendage    Past Surgical History:  Procedure Laterality Date  . BREAST CYST EXCISION Left 1980-1990's   x 3  . CARDIAC  CATHETERIZATION  10/16/2012  . CATARACT EXTRACTION, BILATERAL Bilateral    03/2015 and 05/2015  . CORONARY ANGIOPLASTY WITH STENT PLACEMENT  04/01/2013   "1" (06/01/2013)  . CORONARY ARTERY BYPASS GRAFT N/A 10/22/2012   Procedure: CORONARY ARTERY BYPASS GRAFTING (CABG);  Surgeon: Rexene Alberts, MD;  Location: [place];  Service: Open Heart Surgery;  Laterality: N/A;  . DILATION AND CURETTAGE OF UTERUS    . INTRAOPERATIVE TRANSESOPHAGEAL ECHOCARDIOGRAM N/A 10/22/2012   Procedure: INTRAOPERATIVE TRANSESOPHAGEAL ECHOCARDIOGRAM;  Surgeon: Rexene Alberts, MD;  Location: [place];  Service: Open Heart Surgery;  Laterality: N/A;  . LEFT HEART CATHETERIZATION WITH CORONARY ANGIOGRAM N/A 10/16/2012   Procedure: LEFT HEART CATHETERIZATION WITH CORONARY ANGIOGRAM;  Surgeon: Sinclair Grooms, MD;  Location[facility] CATH LAB;  Service: Cardiovascular;  Laterality: N/A;  . LEFT HEART CATHETERIZATION WITH CORONARY/GRAFT ANGIOGRAM N/A 06/01/2013   Procedure: LEFT HEART CATHETERIZATION WITH Beatrix Fetters;  Surgeon: Sinclair Grooms, MD;  Location[facility] C[facility] LAB;  Service: Cardiovascular;  Laterality: N/A;  . MAZE N/A 10/22/2012   Procedure: MAZE;  Surgeon: Rexene Alberts, MD;  Location: [place];  Service: Open Heart Surgery;  Laterality: N/A;  . PERCUTANEOUS CORONARY STENT INTERVENTION (PCI-S)  06/01/2013   Procedure: PERCUTANEOUS CORONARY STENT INTERVENTION (PCI-S);  Surgeon: Sinclair Grooms, MD;  Location: [facility] CATH LAB;  Service: Cardiovascular;;  .  TONSILLECTOMY  1949  . TOTAL ABDOMINAL HYSTERECTOMY W/ BILATERAL SALPINGOOPHORECTOMY  1990   benign growth    Current Medications: Outpatient Medications Prior to Visit  Medication Sig Dispense Refill  . aspirin EC 81 MG tablet Take 1 tablet (81 mg total) by mouth daily.    Marland Kitchen atenolol (TENORMIN) 100 MG tablet Take 1 tablet (100 mg total) by mouth daily. 90 tablet 2  . atorvastatin (LIPITOR) 10 MG tablet TAKE 1 TABLET BY MOUTH EVERY DAY 90 tablet 3  . benazepril  (LOTENSIN) 40 MG tablet TAKE 1 TABLET(40 MG) BY MOUTH DAILY 30 tablet 9  . cholecalciferol (VITAMIN D) 1000 UNITS tablet Take 1,000 Units by mouth daily.    . furosemide (LASIX) 20 MG tablet Take 1 tablet (20 mg total) by mouth as needed for fluid or edema. Reported on 12/19/2015 30 tablet 4  . Glucosamine-Chondroitin-Vit D3 1500-1200-800 MG-MG-UNIT PACK Take 1,200 capsules by mouth daily. Joint pain    . hydrochlorothiazide (HYDRODIURIL) 25 MG tablet Take 1 tablet (25 mg total) by mouth daily. Patient needs to call and schedule an appointment for further refills 1st attempt 90 tablet 0  . Multiple Vitamin (MULTIVITAMIN) tablet Take 1 tablet by mouth daily.    . potassium chloride (K-DUR) 10 MEQ tablet Take 10 mEq by mouth daily as needed (Take with as needed Furosemide).    . ezetimibe (ZETIA) 10 MG tablet Take 1 tablet (10 mg total) by mouth daily. (Patient not taking: Reported on 08/15/2017) 90 tablet 0  . potassium chloride (K-DUR,KLOR-CON) 10 MEQ tablet Take 1 tablet (10 mEq total) by mouth daily. (Patient not taking: Reported on 08/15/2017) 90 tablet 0   No facility-administered medications prior to visit.      Allergies:   Oxycodone   Social History   Socioeconomic History  . Marital status: Widowed    Spouse name: None  . Number of children: 2  . Years of education: None  . Highest education level: None  Social Needs  . Financial resource strain: None  . Food insecurity - worry: None  . Food insecurity - inability: None  . Transportation needs - medical: None  . Transportation needs - non-medical: None  Occupational History  . Occupation: retired    Comment: LPN  Tobacco Use  . Smoking status: Never Smoker  . Smokeless tobacco: Never Used  Substance and Sexual Activity  . Alcohol use: Yes    Alcohol/week: 0.6 oz    Types: 1 Glasses of wine per week    Comment: 1 glass of wine 4 times/week  . Drug use: No  . Sexual activity: No  Other Topics Concern  . None  Social  History Narrative   Widowed.  Lives alone.  Retired Corporate treasurer.  1 son in [place], 1 son in [place].   Great granddaughter (in [place])     Family History:  The patient's family history includes Cancer in her brother; Heart attack (age of onset: 2) in her mother; Heart disease in her other; Heart disease (age of onset: 54) in her brother; Hypertension in her brother and mother.   ROS:   Please see the history of present illness.    No specific complaints. All other systems reviewed and are negative.   PHYSICAL EXAM:   VS:  BP 140/70   Pulse 65   Ht 5\' 6"  (1.676 m)   Wt 137 lb (62.1 kg)   SpO2 95%   BMI 22.11 kg/m    GEN: Well nourished, well developed, in  no acute distress  HEENT: normal  Neck: no JVD, carotid bruits, or masses Cardiac: RRR; no murmurs, rubs, or gallops,no edema  Respiratory:  clear to auscultation bilaterally, normal work of breathing GI: soft, nontender, nondistended, + BS MS: no deformity or atrophy  Skin: warm and dry, no rash Neuro:  Alert and Oriented x 3, Strength and sensation are intact Psych: euthymic mood, full affect  Wt Readings from Last 3 Encounters:  08/15/17 137 lb (62.1 kg)  09/06/16 130 lb 12.8 oz (59.3 kg)  09/05/16 134 lb 6.4 oz (61 kg)      Studies/Labs Reviewed:   EKG:  EKG normal sinus rhythm, interventricular conduction delay, QS pattern V1 and V2.  Otherwise unremarkable.  Recent Labs: 09/06/2016: ALT 14; BUN 25; Creat 1.06; Hemoglobin 13.4; Platelets 213; Potassium 3.4; Sodium 135   Lipid Panel    Component Value Date/Time   CHOL 190 09/06/2016 1146   TRIG 139 09/06/2016 1146   HDL 56 09/06/2016 1146   CHOLHDL 3.4 09/06/2016 1146   VLDL 28 09/06/2016 1146   LDLCALC 106 (H) 09/06/2016 1146    Additional studies/ records that were reviewed today include:  None    ASSESSMENT:    1. Coronary artery disease involving autologous vein coronary bypass graft with other forms of angina pectoris ([place])   2. Obstruction of carotid artery,  unspecified laterality   3. Essential hypertension, benign   4. Paroxysmal atrial fibrillation ([facility])   5. S/P Maze operation for atrial fibrillation   6. Chronic diastolic heart failure ([facility])      PLAN:  In order of problems listed above:  1. Bypass graft failure with percutaneous intervention and resolution of angina.  No recurrence of symptoms since the procedure in October 2014, 6 months after bypass surgery.  She has done well since that time.  We have still not at target with lipid lowering. 2. No neurological complaints. 3. Excellent blood pressure control would be less than or equal to 130/85 mmHg.  Low-salt diet, aerobic activity is encouraged. 4. No complaints to suggest recurrent atrial fibrillation.  She is status post maze procedure. 5. See above. 6. No evidence of volume overload. 7. We discussed poor lipid control.  I discussed PCS K-9 therapy with the patient I suggested a referral to the lipid clinic.  She is reluctant due to financial constraints.  She will consider and call if she decides to proceed.  In the meantime continue low-dose atorvastatin, low-fat diet, and physical activity.  Clinical follow-up in 1 year.  Call if chest pain or dyspnea.    Medication Adjustments/Labs and Tests Ordered: Current medicines are reviewed at length with the patient today.  Concerns regarding medicines are outlined above.  Medication changes, Labs and Tests ordered today are listed in the Patient Instructions below. Patient Instructions  Medication Instructions:  Your physician recommends that you continue on your current medications as directed. Please refer to the Current Medication list given to you today.  Labwork: None  Testing/Procedures: None  Follow-Up: Your physician wants you to follow-up in: 1 year with Dr. Tamala Julian. You will receive a reminder letter in the mail two months in advance. If you don't receive a letter, please call our office to schedule the follow-up  appointment.   Any Other Special Instructions Will Be Listed Below (If Applicable).  Please give our office a call if you decide to move forward with our Lipid Clinic to see about cholesterol lowering options.   If you need a refill  on your cardiac medications before your next appointment, please call your pharmacy.      Signed, Sinclair Grooms, MD  08/15/2017 5:20 PM    [facility] [address], [place], [place]  [address] Phone: [phone]; Fax: [phone]

## 2017-09-04 ENCOUNTER — Other Ambulatory Visit: Payer: Self-pay | Admitting: Interventional Cardiology

## 2017-09-16 ENCOUNTER — Telehealth: Payer: Self-pay | Admitting: Family Medicine

## 2017-09-16 DIAGNOSIS — R5383 Other fatigue: Secondary | ICD-10-CM

## 2017-09-16 DIAGNOSIS — E782 Mixed hyperlipidemia: Secondary | ICD-10-CM

## 2017-09-16 NOTE — Telephone Encounter (Signed)
This was sent to me. There are future orders in system for cmet and lipids that were due last June. Do you want anything else?

## 2017-09-16 NOTE — Telephone Encounter (Signed)
New Message  Pt verbalized wanting to know if she needs to come in for labs prior to her appt, on her appt date, or if she is having labs at all. I did not see any orders.  Please f/u with pt

## 2017-09-16 NOTE — Telephone Encounter (Signed)
Scheduled for 09/18/17 and entered future orders.

## 2017-09-16 NOTE — Telephone Encounter (Signed)
Add CBC and TSH also, dx fatigue

## 2017-09-17 NOTE — Progress Notes (Signed)
Chief Complaint  Patient presents with  . Medicare Wellness    nonfasting AWV with pelvic. No concerns.     Claudia Howell is a 77 y.o. female who presents for annual wellness visit and follow-up on chronic medical conditions.  She has the following concerns:  She has a flaky area of skin on her right neck which sometimes gets caught on sweaters. She also has a wart on her finger.  No other complaints, feels good.  Hypertension follow-up: Blood pressures are running 130-132/66-70 consistently at home. Denies dizziness, headaches, chest pain. Denies side effects of medications.  Admits to being a little upset today--headed over to argue with [employer] over bills when she leaves here. She denies any CHF exacerbations in the last year. Denies edema. She eats a low sodium diet. She is under the care of Dr. Tamala Julian for coronary artery disease with bypass graft failure, saphenous vein graft stent to obtuse marginal and known occlusion of vein graft to RCA, hypertension, acute on chronic systolic and diastolic heart failure, history of paroxysmal atrial fibrillation, s/p maze procedure, and essential hypertension. She was last seen in February.  No regimen changes were made, however it was acknowledged that lipids weren't at goal.  Lipid clinic referral was recommended but declined by patient. PSK9 inhib was recommended.   Hyperlipidemia follow-up: Patient is reportedly following a low-fat, low cholesterol diet. She is currently tolerating 10mg  of atorvastatin alternating with 5mg  every other day.  She does have some cramps with this, uses quinine or ibuprofen prn, but this overally has been tolerable. She tried Zetia last year for 5 days and terrible cramps in her right leg, which lasted for 2 weeks. Previously documented in notes that Atorvastatin dose had been cut from 20mg  to 10mg  due to side effects, and that her leg cramps were even worse on Crestor in the past.   Goal of LDL<70 (per Dr. Tamala Julian). Lab  Results  Component Value Date   CHOL 190 09/06/2016   HDL 56 09/06/2016   LDLCALC 106 (H) 09/06/2016   TRIG 139 09/06/2016   CHOLHDL 3.4 09/06/2016    Carotid artery disease: Last checked 12/2015, and was stable. IMPRESSION: 1. Bilateral carotid bifurcation and proximal ICA plaque, right worse than left, resulting in less than 50% diameter stenosis. The exam does not exclude plaque ulceration or embolization. Continued surveillance recommended. 2. Antegrade bilateral vertebral arterial flow.   Immunization History  Administered Date(s) Administered  . Influenza Split 05/07/2012  . Influenza, High Dose Seasonal PF 04/22/2013, 03/25/2014, 04/08/2015, 04/13/2016, 03/27/2017  . Pneumococcal Conjugate-13 09/09/2014  . Pneumococcal Polysaccharide-23 06/30/2009  . Td 06/25/2002  . Tdap 01/23/2009  . Zoster 01/24/2008  . Zoster Recombinat (Shingrix) 03/14/2017, 05/24/2017   Last Pap smear: n/a (hysterectomy for benign reasons) [facility] Last colonoscopy: 10/2014--multiple tubular adenomas; repeat due 10/2017 (59yr) Last DEXA: 09/2011 (T-1.1 L fem neck)--[facility] Exercise: Stationery bike daily 15 minutes daily since [place], only 3x/week prior to that. Uses 5# weights sporadically, about 2x/week Dentist: twice yearly Ophtho: sees retinal specialist every 3-4 months.  Other doctors caring for patient include: Cardiology: Dr. Tamala Julian Ophtho: Dr. Peter Garter Retinal specialist: Dr. Zadie Rhine Dentist: Dr. Raelyn Ensign GI: Dr. Michail Sermon  Depression screen: negative Fall screen: negative Functional status screen:unremarkable Mini-Cog screen score of 5 (normal) See full questionnaires in Epic  End of Life Discussion: Patient hasa living will and medical power of attorney  Past Medical History:  Diagnosis Date  . Adenomatous colon polyp 10/09  . Coronary  artery disease    Last cath 12/14 Cath 12/8 100% SVG occlusion to RCA, 95% SVG.  The stenosis to OM.  LIMA to  LAD patent but diffuse disease in LAD after graft.  Overlapping stents mid/distal SVG to OM.  Marland Kitchen Decubitus ulcer of coccyx 10/22/12   1/2 inch raw open area on coccyx  . Hyperlipidemia   . Hypertension    Dr.Henry Tamala Julian  . Osteopenia 9/09  . PAF (paroxysmal atrial fibrillation) ([place])    during cath/notes 10/16/2012  . S/P CABG x 3 10/22/2012   LIMA to LAD, SVG to distal RCA, SVG to OM, EVH from right thigh  . S/P Maze operation for atrial fibrillation 10/22/2012   Left side lesion set using bipolar radiofrequency and cryothermy ablation with clipping of LA appendage    Past Surgical History:  Procedure Laterality Date  . BREAST CYST EXCISION Left 1980-1990's   x 3  . CARDIAC CATHETERIZATION  10/16/2012  . CATARACT EXTRACTION, BILATERAL Bilateral    03/2015 and 05/2015  . CORONARY ANGIOPLASTY WITH STENT PLACEMENT  04/01/2013   "1" (06/01/2013)  . CORONARY ARTERY BYPASS GRAFT N/A 10/22/2012   Procedure: CORONARY ARTERY BYPASS GRAFTING (CABG);  Surgeon: Rexene Alberts, MD;  Location: [facility];  Service: Open Heart Surgery;  Laterality: N/A;  . DILATION AND CURETTAGE OF UTERUS    . INTRAOPERATIVE TRANSESOPHAGEAL ECHOCARDIOGRAM N/A 10/22/2012   Procedure: INTRAOPERATIVE TRANSESOPHAGEAL ECHOCARDIOGRAM;  Surgeon: Rexene Alberts, MD;  Location: [facility];  Service: Open Heart Surgery;  Laterality: N/A;  . LEFT HEART CATHETERIZATION WITH CORONARY ANGIOGRAM N/A 10/16/2012   Procedure: LEFT HEART CATHETERIZATION WITH CORONARY ANGIOGRAM;  Surgeon: Sinclair Grooms, MD;  Location: [facility] CATH LAB;  Service: Cardiovascular;  Laterality: N/A;  . LEFT HEART CATHETERIZATION WITH CORONARY/GRAFT ANGIOGRAM N/A 06/01/2013   Procedure: LEFT HEART CATHETERIZATION WITH Beatrix Fetters;  Surgeon: Sinclair Grooms, MD;  Location: [facility] CATH LAB;  Service: Cardiovascular;  Laterality: N/A;  . MAZE N/A 10/22/2012   Procedure: MAZE;  Surgeon: Rexene Alberts, MD;  Location: [facility];  Service: Open Heart Surgery;  Laterality:  N/A;  . PERCUTANEOUS CORONARY STENT INTERVENTION (PCI-S)  06/01/2013   Procedure: PERCUTANEOUS CORONARY STENT INTERVENTION (PCI-S);  Surgeon: Sinclair Grooms, MD;  Location: [facility] CATH LAB;  Service: Cardiovascular;;  . TONSILLECTOMY  1949  . TOTAL ABDOMINAL HYSTERECTOMY W/ BILATERAL SALPINGOOPHORECTOMY  1990   benign growth    Social History   Socioeconomic History  . Marital status: Widowed    Spouse name: Not on file  . Number of children: 2  . Years of education: Not on file  . Highest education level: Not on file  Occupational History  . Occupation: retired    Comment: LPN  Social Needs  . Financial resource strain: Not on file  . Food insecurity:    Worry: Not on file    Inability: Not on file  . Transportation needs:    Medical: Not on file    Non-medical: Not on file  Tobacco Use  . Smoking status: Never Smoker  . Smokeless tobacco: Never Used  Substance and Sexual Activity  . Alcohol use: Yes    Alcohol/week: 0.6 oz    Types: 1 Glasses of wine per week    Comment: 1 glass of wine 1 times/week  . Drug use: No  . Sexual activity: Not Currently  Lifestyle  . Physical activity:    Days per week: Not on file    Minutes per  session: Not on file  . Stress: Not on file  Relationships  . Social connections:    Talks on phone: Not on file    Gets together: Not on file    Attends religious service: Not on file    Active member of club or organization: Not on file    Attends meetings of clubs or organizations: Not on file    Relationship status: Not on file  . Intimate partner violence:    Fear of current or ex partner: Not on file    Emotionally abused: Not on file    Physically abused: Not on file    Forced sexual activity: Not on file  Other Topics Concern  . 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  1 son in [place], 1 son in [place]. 7 grandchildren, 2 great granddaughters (in [place])    Family History  Problem Relation Age of  Onset  . Hypertension Mother   . Heart attack Mother 27  . Hypertension Brother   . Cancer Brother        prostate  . Heart disease Brother 63       stent  . Heart disease Other        x2 (age 18 and 54)  . Diabetes Neg Hx   . Breast cancer Neg Hx   . Colon cancer Neg Hx     Outpatient Encounter Medications as of 09/19/2017  Medication Sig Note  . aspirin EC 81 MG tablet Take 1 tablet (81 mg total) by mouth daily.   Marland Kitchen atenolol (TENORMIN) 100 MG tablet Take 1 tablet (100 mg total) by mouth daily.   Marland Kitchen atorvastatin (LIPITOR) 10 MG tablet TAKE 1 TABLET BY MOUTH EVERY DAY 09/19/2017: Alternates take 10mg  with 5mg  every other day  . benazepril (LOTENSIN) 40 MG tablet TAKE 1 TABLET(40 MG) BY MOUTH DAILY   . cholecalciferol (VITAMIN D) 1000 UNITS tablet Take 1,000 Units by mouth daily.   . Glucosamine-Chondroitin-Vit D3 1500-1200-800 MG-MG-UNIT PACK Take 1,200 capsules by mouth daily. Joint pain   . hydrochlorothiazide (HYDRODIURIL) 25 MG tablet Take 1 tablet (25 mg total) by mouth daily. Patient needs to call and schedule an appointment for further refills 1st attempt   . Multiple Vitamin (MULTIVITAMIN) tablet Take 1 tablet by mouth daily.   . potassium chloride (K-DUR) 10 MEQ tablet Take 10 mEq by mouth daily as needed (Take with as needed Furosemide).   . furosemide (LASIX) 20 MG tablet Take 1 tablet (20 mg total) by mouth as needed for fluid or edema. Reported on 12/19/2015 (Patient not taking: Reported on 09/19/2017)    No facility-administered encounter medications on file as of 09/19/2017.     Allergies  Allergen Reactions  . Zetia [Ezetimibe] Other (See Comments)    Muscle cramps  . Oxycodone Nausea And Vomiting    Extreme nausea and vomiting    ROS: The patient denies anorexia, fever, headaches, decreased hearing, ear pain, sore throat, breast concerns, palpitations, dizziness, syncope, dyspnea on exertion, cough, swelling, nausea, vomiting, diarrhea, constipation, abdominal pain,  melena, hematochezia, indigestion/heartburn, hematuria, incontinence, dysuria, vaginal bleeding, discharge, odor or itch, genital lesions, joint pains, weakness, tremor, suspicious skin lesions, depression, anxiety, abnormal bleeding/bruising, or enlarged lymph nodes. +some trouble falling asleep; chamomille tea helps She wears carpal tunnel brace on left wrist to sleep and no longer has any tingling/numbness in hands at night.   She reports no changes to the discoloration to the  right side of her nose (previously biopsied/benign).   PHYSICAL EXAM:  BP (!) 160/70   Pulse 64   Ht 5\' 5"  (1.651 m)   Wt 137 lb 9.6 oz (62.4 kg)   BMI 22.90 kg/m   Wt Readings from Last 3 Encounters:  09/19/17 137 lb 9.6 oz (62.4 kg)  08/15/17 137 lb (62.1 kg)  09/06/16 130 lb 12.8 oz (59.3 kg)    General Appearance:  Alert, cooperative, no distress, appears stated age   Head:  Normocephalic, without obvious abnormality, atraumatic   Eyes:  PERRL, conjunctiva/corneas clear, EOM's intact, fundi benign   Ears:  Normal TM's and external ear canals   Nose:  Nares normal, mucosa is normal, no erythema, purulence or sinus tenderness   Throat:  Lips, mucosa, and tongue normal; teeth and gums normal   Neck:  Supple, no lymphadenopathy; thyroid: no enlargement/tenderness/ nodules; carotid bruit noted on right, none on left  Back:  Spine nontender, no curvature, ROM normal, no CVA tenderness   Lungs:  Clear to auscultation bilaterally without wheezes, rales or ronchi; respirations unlabored   Chest Wall:  No tenderness or deformity. WHSS  Heart:  Regular rate and rhythm, S1 and S2 normal, no murmur, rub or gallop   Breast Exam:  No tenderness, masses, or nipple discharge or inversion. No axillary lymphadenopathy. WHSS left breast  Abdomen:  Soft, non-tender, nondistended, normoactive bowel sounds, no masses, no hepatosplenomegaly   Genitalia:  Normal external genitalia without lesions;  +atrophic changes. BUS and vagina normal; normal bimanual exam without masses. uterus is surgically absent.  Rectal:  Normal tone, no masses or tenderness; heme negative brown stool  Extremities:  No clubbing, cyanosis or edema   Pulses:  2+ and symmetric all extremities   Skin:  Skin color, texture, turgor normal, no rashes or lesions. Large hyperpigmented macular area on the right side of her nose is mostly covered with makeup.  Smooth/flat (and unchanged, per pt). Flaky/rough patch at right neck.  verrucous lesion at right 5th middle phalanx. Nodule left index finger, radial aspect of DIP, nontender  Lymph nodes:  Cervical, supraclavicular, and axillary nodes normal   Neurologic:  CNII-XII intact, normal strength, sensation and gait; reflexes 2+ and symmetric throughout   Psych: Normal mood, affect, hygiene and grooming   Lab Results  Component Value Date   WBC 6.4 09/18/2017   HGB 13.2 09/18/2017   HCT 39.4 09/18/2017   MCV 93 09/18/2017   PLT 200 09/18/2017     Chemistry      Component Value Date/Time   NA 140 09/18/2017 0921   K 3.8 09/18/2017 0921   CL 103 09/18/2017 0921   CO2 23 09/18/2017 0921   BUN 16 09/18/2017 0921   CREATININE 0.98 09/18/2017 0921   CREATININE 1.06 (H) 09/06/2016 1146      Component Value Date/Time   CALCIUM 9.1 09/18/2017 0921   ALKPHOS 46 09/18/2017 0921   AST 19 09/18/2017 0921   ALT 17 09/18/2017 0921   BILITOT 0.4 09/18/2017 0921     Fasting glucose 92  Lab Results  Component Value Date   CHOL 177 09/18/2017   HDL 48 09/18/2017   LDLCALC 96 09/18/2017   TRIG 167 (H) 09/18/2017   CHOLHDL 3.7 09/18/2017   Lab Results  Component Value Date   TSH 1.340 09/18/2017    ASSESSMENT/PLAN:  Medicare annual wellness visit, subsequent  Essential hypertension, benign - elevated today, better at home. Continue regular exercise, low sodium diet and current  BP meds; f/u if persistently elevated  Coronary  artery disease involving autologous vein coronary bypass graft without angina pectoris - stable on current regimen  Mixed hyperlipidemia - limited statin tolerability, not at goal (LDL<70). discussed in detail, agree with rec for lipid clinic  Osteopenia, unspecified location - mild; had recommended 5 yr f/u DEXA, but pt not interested, since she would not take any treatments. Discussed Ca, D, wt-bearing exercise  Obstruction of carotid artery, unspecified laterality - <50% per last [place], now with bruit heard on right.  Recheck carotid US (pt prefers to wait until August). Asymptomatic. Cont ASA, statin  Hx of adenomatous colonic polyps - due for 3 year f/u in May. To contact Dr. Kathline Magic office if she doesn't hear from them by May.  Viral wart on finger - will return for treatment  Actinic keratosis - neck; will return for treatment.  proper sunscreen precautions reviewed   Return for cryotherapy to wart and AK   Discussed alternate dosing Crestor (retrial, along with CoQ10) vs PSK-9 vs Livalo trial. Agree with Dr. Thompson Caul rec for seeing lipid clinic (she thought it was a seminar, didn't realized what lipid clinic was--willing to go) Send labs to Dr. Tamala Julian with this info.  R carotid bruit--needs f/u US scheduled.  Prefers to wait until August.  Discussed monthly self breast exams and yearly mammogram (past due and reminded to schedule); at least 30 minutes of aerobic activity at least 5 days/week, plus weight-bearing exercise at least 2x/week; proper sunscreen use reviewed; healthy diet, including goals of calcium and vitamin D intake and alcohol recommendations (less than or equal to 1 drink/day) reviewed; regular seatbelt use; changing batteries in smoke detectors. Immunization recommendations discussed. Continue yearly high dose flu shots. Td or Tdap due next year. Colonoscopy recommendations reviewed--due 10/2017  MOST form reviewed and updated. She is Full Code, Full  Care    Medicare Attestation I have personally reviewed: The patient's medical and social history Their use of alcohol, tobacco or illicit drugs Their current medications and supplements The patient's functional ability including ADLs,fall risks, home safety risks, cognitive, and hearing and visual impairment Diet and physical activities Evidence for depression or mood disorders  The patient's weight, height and BMI have been recorded in the chart.  I have made referrals, counseling, and provided education to the patient based on review of the above and I have provided the patient with a written personalized care plan for preventive services.

## 2017-09-18 ENCOUNTER — Other Ambulatory Visit: Payer: Medicare Other

## 2017-09-18 DIAGNOSIS — R5383 Other fatigue: Secondary | ICD-10-CM | POA: Diagnosis not present

## 2017-09-18 DIAGNOSIS — E782 Mixed hyperlipidemia: Secondary | ICD-10-CM

## 2017-09-19 ENCOUNTER — Encounter: Payer: Self-pay | Admitting: Family Medicine

## 2017-09-19 ENCOUNTER — Ambulatory Visit (INDEPENDENT_AMBULATORY_CARE_PROVIDER_SITE_OTHER): Payer: Medicare Other | Admitting: Family Medicine

## 2017-09-19 VITALS — BP 160/70 | HR 64 | Ht 65.0 in | Wt 137.6 lb

## 2017-09-19 DIAGNOSIS — I25810 Atherosclerosis of coronary artery bypass graft(s) without angina pectoris: Secondary | ICD-10-CM

## 2017-09-19 DIAGNOSIS — I6529 Occlusion and stenosis of unspecified carotid artery: Secondary | ICD-10-CM

## 2017-09-19 DIAGNOSIS — I10 Essential (primary) hypertension: Secondary | ICD-10-CM

## 2017-09-19 DIAGNOSIS — M8580 Other specified disorders of bone density and structure, unspecified site: Secondary | ICD-10-CM

## 2017-09-19 DIAGNOSIS — L570 Actinic keratosis: Secondary | ICD-10-CM

## 2017-09-19 DIAGNOSIS — E782 Mixed hyperlipidemia: Secondary | ICD-10-CM | POA: Diagnosis not present

## 2017-09-19 DIAGNOSIS — Z0000 Encounter for general adult medical examination without abnormal findings: Secondary | ICD-10-CM

## 2017-09-19 DIAGNOSIS — Z86010 Personal history of colonic polyps: Secondary | ICD-10-CM

## 2017-09-19 DIAGNOSIS — B079 Viral wart, unspecified: Secondary | ICD-10-CM | POA: Diagnosis not present

## 2017-09-19 LAB — COMPREHENSIVE METABOLIC PANEL
ALT: 17 IU/L (ref 0–32)
AST: 19 IU/L (ref 0–40)
Albumin/Globulin Ratio: 1.6 (ref 1.2–2.2)
Albumin: 4.3 g/dL (ref 3.5–4.8)
Alkaline Phosphatase: 46 IU/L (ref 39–117)
BUN/Creatinine Ratio: 16 (ref 12–28)
BUN: 16 mg/dL (ref 8–27)
Bilirubin Total: 0.4 mg/dL (ref 0.0–1.2)
CO2: 23 mmol/L (ref 20–29)
Calcium: 9.1 mg/dL (ref 8.7–10.3)
Chloride: 103 mmol/L (ref 96–106)
Creatinine, Ser: 0.98 mg/dL (ref 0.57–1.00)
GFR calc Af Amer: 66 mL/min/{1.73_m2} (ref >59)
GFR calc non Af Amer: 57 mL/min/{1.73_m2} — ABNORMAL LOW (ref >59)
Globulin, Total: 2.7 g/dL (ref 1.5–4.5)
Glucose: 92 mg/dL (ref 65–99)
Potassium: 3.8 mmol/L (ref 3.5–5.2)
Sodium: 140 mmol/L (ref 134–144)
Total Protein: 7 g/dL (ref 6.0–8.5)

## 2017-09-19 LAB — TSH: TSH: 1.34 u[IU]/mL (ref 0.450–4.500)

## 2017-09-19 LAB — CBC WITH DIFFERENTIAL/PLATELET
Basophils Absolute: 0 10*3/uL (ref 0.0–0.2)
Basos: 1 %
EOS (ABSOLUTE): 0.3 10*3/uL (ref 0.0–0.4)
Eos: 4 %
Hematocrit: 39.4 % (ref 34.0–46.6)
Hemoglobin: 13.2 g/dL (ref 11.1–15.9)
Immature Grans (Abs): 0 10*3/uL (ref 0.0–0.1)
Immature Granulocytes: 0 %
Lymphocytes Absolute: 1.8 10*3/uL (ref 0.7–3.1)
Lymphs: 28 %
MCH: 31.1 pg (ref 26.6–33.0)
MCHC: 33.5 g/dL (ref 31.5–35.7)
MCV: 93 fL (ref 79–97)
Monocytes Absolute: 0.4 10*3/uL (ref 0.1–0.9)
Monocytes: 7 %
Neutrophils Absolute: 3.9 10*3/uL (ref 1.4–7.0)
Neutrophils: 60 %
Platelets: 200 10*3/uL (ref 150–379)
RBC: 4.24 x10E6/uL (ref 3.77–5.28)
RDW: 13.1 % (ref 12.3–15.4)
WBC: 6.4 10*3/uL (ref 3.4–10.8)

## 2017-09-19 LAB — LIPID PANEL
Chol/HDL Ratio: 3.7 ratio (ref 0.0–4.4)
Cholesterol, Total: 177 mg/dL (ref 100–199)
HDL: 48 mg/dL (ref >39)
LDL Calculated: 96 mg/dL (ref 0–99)
Triglycerides: 167 mg/dL — ABNORMAL HIGH (ref 0–149)
VLDL Cholesterol Cal: 33 mg/dL (ref 5–40)

## 2017-09-19 NOTE — Patient Instructions (Addendum)
  HEALTH MAINTENANCE RECOMMENDATIONS:  It is recommended that you get at least 30 minutes of aerobic exercise at least 5 days/week (for weight loss, you may need as much as 60-90 minutes). This can be any activity that gets your heart rate up. This can be divided in 10-15 minute intervals if needed, but try and build up your endurance at least once a week.  Weight bearing exercise is also recommended twice weekly.  Eat a healthy diet with lots of vegetables, fruits and fiber.  "Colorful" foods have a lot of vitamins (ie green vegetables, tomatoes, red peppers, etc).  Limit sweet tea, regular sodas and alcoholic beverages, all of which has a lot of calories and sugar.  Up to 1 alcoholic drink daily may be beneficial for women (unless trying to lose weight, watch sugars).  Drink a lot of water.  Calcium recommendations are 1200-1500 mg daily (1500 mg for postmenopausal women or women without ovaries), and vitamin D 1000 IU daily.  This should be obtained from diet and/or supplements (vitamins), and calcium should not be taken all at once, but in divided doses.  Monthly self breast exams and yearly mammograms for women over the age of 26 is recommended.  Sunscreen of at least SPF 30 should be used on all sun-exposed parts of the skin when outside between the hours of 10 am and 4 pm (not just when at beach or pool, but even with exercise, golf, tennis, and yard work!)  Use a sunscreen that says "broad spectrum" so it covers both UVA and UVB rays, and make sure to reapply every 1-2 hours.  Remember to change the batteries in your smoke detectors when changing your clock times in the spring and fall.  Use your seat belt every time you are in a car, and please drive safely and not be distracted with cell phones and texting while driving.   Ms. Cohrs , Thank you for taking time to come for your Medicare Wellness Visit. I appreciate your ongoing commitment to your health goals. Please review the following  plan we discussed and let me know if I can assist you in the future.   These are the goals we discussed: Goals    None      This is a list of the screening recommended for you and due dates:  Health Maintenance  Topic Date Due  . Mammogram  10/10/2017  . Colon Cancer Screening  10/28/2017  . Tetanus Vaccine  01/24/2019  . Flu Shot  Completed  . DEXA scan (bone density measurement)  Completed  . Pneumonia vaccines  Completed   Your tetanus shot needs to be given at the pharmacy (due to insurance). Td vs TdaP is due next year. [facility]. I recommend yearly 3D mammograms, it has been almost 2 years. Please contact Dr. Kathline Magic office if you don't hear from him in the next month or two.  Please try and get [place] copies of your living will and healthcare power of attorney at your convenience.  Please contact Dr. Thompson Caul office for the lipid clinic referral/appointment as we discussed.  I'm sending him your labs and our discussion today.  Check the CDC travel website to read about recommendations for [place].

## 2017-09-23 ENCOUNTER — Telehealth: Payer: Self-pay | Admitting: Interventional Cardiology

## 2017-09-23 NOTE — Telephone Encounter (Signed)
Belva Crome, MD  Rita Ohara, MD  Cc: Loren Racer, LPN        Thanks, we will reach out to he again about lipid clinic.   Anderson [place], please try to schedule lipid clinic f/u for Ms. Claudia Howell.    Left message to call back

## 2017-09-23 NOTE — Telephone Encounter (Signed)
Spoke with pt and she does not wish to proceed with Lipid Clinic.  Pt states she would rather work on this on her own for now.  Advised I will make Dr. Tamala Julian aware.

## 2017-09-26 NOTE — Addendum Note (Signed)
Addended by: Carolee Rota F on: 09/26/2017 02:42 PM   Modules accepted: Orders

## 2017-09-26 NOTE — Telephone Encounter (Signed)
Thanks

## 2017-10-01 NOTE — Progress Notes (Signed)
Chief Complaint  Patient presents with  . Procedure    removal of wart on 5th finger, right hand and place on right side of her neck. Also would like to show you a place on her nose.     Patient presents for cryotherapy to wart on right middle finger, and AK on right side of her neck. She also has noted a rough spot on the left side of her nose about 12-18 months ago.  She periodically files it so it is smooth, but it comes back.  Never bleeds, not painful.  She took makeup off her nose today for me to evaluate.  Risks/side effects/treatment alternatives were reviewed and she wishes to proceed with treatment with cryotherapy  PMH, PSH, SH reviewed Meds reviewed Allergies  Allergen Reactions  . Zetia [Ezetimibe] Other (See Comments)    Muscle cramps  . Oxycodone Nausea And Vomiting    Extreme nausea and vomiting    ROS: no fever, chills, other skin concerns except those mentioned in HPI.  No other complaints/concerns today    PHYSICAL EXAM:  BP 140/80   Pulse 72   Ht 5\' 5"  (1.651 m)   Wt 137 lb 12.8 oz (62.5 kg)   BMI 22.93 kg/m   Skin: 55mm hyperkeratotic raised area on the left side of the nose. Large macular area of hyperpigmentation covering the right side of the nose, and also hyperpigmentation of both upper cheeks. verrucous lesion at right 5th middle phalanx Flaky/rough patch at right neck   ASSESSMENT/PLAN:  Viral wart on finger - pared down with 15 blade and treated with cryotherapy. return in 3-4 wks for second treatment if needed - Plan: PR DESTRUC BENIGN/PREMAL,FIRST LESION, PR DESTRUC BENIGN/PREMAL,2-14 LESIONS  Actinic keratosis - of right neck, left nose.  to f/u with derm if lesions persist after healing from today's treatment - Plan: PR DESTRUC BENIGN/PREMAL,FIRST LESION, PR DESTRUC BENIGN/PREMAL,2-14 LESIONS    verrucca-freeze applied in freeze-thaw-freeze method to lesions on left nose, right neck. 15 blade was used to pare down the wart prior to  treating. Patient tolerated procedure well.  woundcare instructions reviewed. If lesion on nose/neck persists, she should f/u with dermatologist. She can return here for additional wart treatments, if needed Expected healing course reviewed.

## 2017-10-02 ENCOUNTER — Ambulatory Visit (INDEPENDENT_AMBULATORY_CARE_PROVIDER_SITE_OTHER): Payer: Medicare Other | Admitting: Family Medicine

## 2017-10-02 ENCOUNTER — Encounter: Payer: Self-pay | Admitting: Family Medicine

## 2017-10-02 VITALS — BP 140/80 | HR 72 | Ht 65.0 in | Wt 137.8 lb

## 2017-10-02 DIAGNOSIS — B079 Viral wart, unspecified: Secondary | ICD-10-CM | POA: Diagnosis not present

## 2017-10-02 DIAGNOSIS — L570 Actinic keratosis: Secondary | ICD-10-CM

## 2017-10-02 DIAGNOSIS — I6529 Occlusion and stenosis of unspecified carotid artery: Secondary | ICD-10-CM

## 2017-10-02 NOTE — Patient Instructions (Signed)
Warts Warts are small growths on the skin. They are common and can occur on various areas of the body. A person may have one wart or multiple warts. Most warts are not painful, and they usually do not cause problems. However, warts can cause pain if they are large or occur in an area of the body where pressure will be applied to them, such as the bottom of the foot. In many cases, warts do not require treatment. They usually go away on their own over a period of many months to a couple years. Various treatments may be done for warts that cause problems or do not go away. Sometimes, warts go away and then come back again. What are the causes? Warts are caused by a type of virus that is called human papillomavirus (HPV). This virus can spread from person to person through direct contact. Warts can also spread to other areas of the body when a person scratches a wart and then scratches another area of his or her body. What increases the risk? Warts are more likely to develop in:  People who are 44-52 years of age.  People who have a weakened body defense system (immune system).  What are the signs or symptoms? A wart may be round or oval or have an irregular shape. Most warts have a rough surface. Warts may range in color from skin color to light yellow, brown, or gray. They are generally less than  inch (1.3 cm) in size. Most warts are painless, but some can be painful when pressure is applied to them. How is this diagnosed? A wart can usually be diagnosed from its appearance. In some cases, a tissue sample may be removed (biopsy) to be looked at under a microscope. How is this treated? In many cases, warts do not need treatment. If treatment is needed, options may include:  Applying medicated solutions, creams, or patches to the wart. These may be over-the-counter or prescription medicines that make the skin soft so that layers will gradually shed away. In many cases, the medicine is applied one  or two times per day and covered with a bandage.  Putting duct tape over the top of the wart (occlusion). You will leave the tape in place for as long as told by your health care provider, then you will replace it with a new strip of tape. This is done until the wart goes away.  Freezing the wart with liquid nitrogen (cryotherapy).  Burning the wart with: ? Laser treatment. ? An electrified probe (electrocautery).  Injection of a medicine (Candida antigen) into the wart to help the body's immune system to fight off the wart.  Surgery to remove the wart.  Follow these instructions at home:  Apply over-the-counter and prescription medicines only as told by your health care provider.  Do not apply over-the-counter wart medicines to your face or genitals before you ask your health care provider if it is okay to do so.  Do not scratch or pick at a wart.  Wash your hands after you touch a wart.  Avoid shaving hair that is over a wart.  Keep all follow-up visits as told by your health care provider. This is important. Contact a health care provider if:  Your warts do not improve after treatment.  You have redness, swelling, or pain at the site of a wart.  You have bleeding from a wart that does not stop with light pressure.  You have diabetes and you develop a  wart. This information is not intended to replace advice given to you by your health care provider. Make sure you discuss any questions you have with your health care provider. Document Released: 03/21/2005 Document Revised: 11/23/2015 Document Reviewed: 09/06/2014 Elsevier Interactive Patient Education  2018 [employer].   Actinic Keratosis An actinic keratosis is a precancerous growth on the skin. This means that it could develop into skin cancer if it is not treated. About 1% of these growths (actinic keratoses) turn into skin cancer within one year if they are not treated. It is important to have all of these growths  evaluated to determine the best treatment approach. What are the causes? This condition is caused by getting too much ultraviolet (UV) radiation from the sun or other UV light sources. What increases the risk? The following factors may make you more likely to develop this condition:  Having light-colored skin and blue eyes.  Having blonde or red hair.  Spending a lot of time in the sun.  Inadequate skin protection when outdoors. This may include: ? Not using sunscreen properly. ? Not covering up skin that is exposed to sunlight.  Aging. The risk of developing an actinic keratosis increases with age.  What are the signs or symptoms? Actinic keratoses look like scaly, rough spots of skin.They can be as small as a pinhead or as big as a quarter. They may itch, hurt, or feel sensitive. In most cases, the growths become red. In some cases, they may be skin-colored, light tan, dark tan, pink, or a combination of any of these colors. There may be a small piece of pink or gray skin (skin tag) growing from the actinic keratosis. In some cases, it may be easier to notice actinic keratoses by feeling them, rather than seeing them. Actinic keratoses appear most often on areas of skin that get a lot of sun exposure, including the scalp, face, ears, lips, upper back, forearms, and the backs of the hands. Sometimes, actinic keratoses disappear, but many reappear a few days to a few weeks later. How is this diagnosed? This condition is usually diagnosed with a physical exam. A tissue sample may be removed from the actinic keratosis and examined under a microscope (biopsy). How is this treated?  Treatment for this condition may include:  Scraping off the actinic keratosis (curettage).  Freezing the actinic keratosis with liquid nitrogen (cryosurgery). This causes the growth to eventually fall off the skin.  Applying medicated creams or gels to destroy the cells in the growth.  Applying chemicals to  the actinic keratosis to make the outer layers of skin peel off (chemical peel).  Photodynamic therapy. In this procedure, medicated cream is applied to the actinic keratosis. This cream increases your skin's sensitivity to light. Then, a strong light is aimed at the actinic keratosis to destroy cells in the growth.  Follow these instructions at home: Skin care  Apply cool, wet cloths (cool compresses) to the affected areas.  Do not scratch your skin.  Check your skin regularly for any growths, especially growths that: ? Start to itch or bleed. ? Change in size, shape, or color. Caring for the treated area  Keep the treated area clean and dry as told by your health care provider.  Do not apply any medicine, cream, or lotion to the treated area unless your health care provider tells you to do that.  Do not pick at blisters or try to break them open. This can cause infection and scarring.  If you have red or irritated skin after treatment, follow instructions from your health care provider about how to take care of the treated area. Make sure you: ? Wash your hands with soap and water before you change your bandage (dressing). If soap and water are not available, use hand sanitizer. ? Change your dressing as told by your health care provider.  If you have red or irritated skin after treatment, check your treated area every day for signs of infection. Check for: ? Swelling, pain, or more redness. ? Fluid or blood. ? Warmth. ? Pus or a bad smell. General instructions  Take over-the-counter and prescription medicines only as told by your health care provider.  Return to your normal activities as told by your health care provider. Ask your health care provider what activities are safe for you.  Do not use any tobacco products, such as cigarettes, chewing tobacco, and e-cigarettes. If you need help quitting, ask your health care provider.  Have a skin exam done every year by a health  care provider who is a skin conditions specialist (dermatologist).  Keep all follow-up visits as told by your health care provider. This is important. How is this prevented?  Do not get sunburns.  Try to avoid the sun between 10:00 a.m. and 4:00 p.m. This is when the UV light is the strongest.  Use a sunscreen or sunblock with SPF 30 (sun protection factor 30) or greater.  Apply sunscreen before you are exposed to sunlight, and reapply periodically as often as directed by the instructions on the sunscreen container.  Always wear sunglasses that have UV protection, and always wear hats and clothing to protect your skin from sunlight.  When possible, avoid medicines that increase your sensitivity to sunlight. These include: ? Certain antibiotic medicines. ? Certain water pills (diuretics). ? Certain prescription medicines that are used to treat acne (retinoids).  Do not use tanning beds or other indoor tanning devices. Contact a health care provider if:  You notice any changes or new growths on your skin.  You have swelling, pain, or more redness around your treated area.  You have fluid or blood coming from your treated area.  Your treated area feels warm to the touch.  You have pus or a bad smell coming from your treated area.  You have a fever.  You have a blister that becomes large and painful. This information is not intended to replace advice given to you by your health care provider. Make sure you discuss any questions you have with your health care provider. Document Released: 09/07/2008 Document Revised: 02/10/2016 Document Reviewed: 02/19/2015 Elsevier Interactive Patient Education  [employer].

## 2017-10-09 ENCOUNTER — Other Ambulatory Visit: Payer: Self-pay | Admitting: Interventional Cardiology

## 2017-11-03 ENCOUNTER — Other Ambulatory Visit: Payer: Self-pay | Admitting: Interventional Cardiology

## 2017-11-04 ENCOUNTER — Other Ambulatory Visit: Payer: Self-pay | Admitting: Family Medicine

## 2017-11-04 DIAGNOSIS — Z1231 Encounter for screening mammogram for malignant neoplasm of breast: Secondary | ICD-10-CM

## 2017-11-06 ENCOUNTER — Ambulatory Visit
Admission: RE | Admit: 2017-11-06 | Discharge: 2017-11-06 | Disposition: A | Discharge status: Home / Self Care | Payer: Medicare Other | Source: Ambulatory Visit | Attending: Family Medicine | Admitting: Family Medicine

## 2017-11-06 DIAGNOSIS — H348111 Central retinal vein occlusion, right eye, with retinal neovascularization: Secondary | ICD-10-CM | POA: Diagnosis not present

## 2017-11-06 DIAGNOSIS — H35041 Retinal micro-aneurysms, unspecified, right eye: Secondary | ICD-10-CM | POA: Diagnosis not present

## 2017-11-06 DIAGNOSIS — H43821 Vitreomacular adhesion, right eye: Secondary | ICD-10-CM | POA: Diagnosis not present

## 2017-11-06 DIAGNOSIS — Z1231 Encounter for screening mammogram for malignant neoplasm of breast: Secondary | ICD-10-CM

## 2017-11-06 DIAGNOSIS — H34231 Retinal artery branch occlusion, right eye: Secondary | ICD-10-CM | POA: Diagnosis not present

## 2017-12-18 ENCOUNTER — Telehealth: Payer: Self-pay | Admitting: Family Medicine

## 2017-12-18 NOTE — Telephone Encounter (Signed)
We generally wait 3 months after a diet change to recheck cholesterol.  Okay to schedule fasting labs and enter future order for lipid panel.  Make sure that she is continuing to take her atorvastatin (we had tolerating 10 alternating with 5mg ).

## 2017-12-18 NOTE — Telephone Encounter (Signed)
Pt called and requested an appt to have her cholesterol check. Pt stated that she has gone vegetarian and wants to know if this new lifestyle is helping her cholesterol. Pt stated she didn't need to see you just wanted the labs. Pt was informed that Dr. Tomi Bamberger would need to advise and place order in sytem. Please advise pt at [phone].

## 2017-12-19 ENCOUNTER — Other Ambulatory Visit: Payer: Self-pay | Admitting: *Deleted

## 2017-12-19 DIAGNOSIS — E7800 Pure hypercholesterolemia, unspecified: Secondary | ICD-10-CM

## 2017-12-19 NOTE — Telephone Encounter (Signed)
Spoke with patient and she will be coming in tomorrow am for lipid panel. She is taking her stating and not having any issues.

## 2017-12-20 ENCOUNTER — Other Ambulatory Visit: Payer: Medicare Other

## 2017-12-20 DIAGNOSIS — E7800 Pure hypercholesterolemia, unspecified: Secondary | ICD-10-CM | POA: Diagnosis not present

## 2017-12-20 LAB — LIPID PANEL
Chol/HDL Ratio: 3.7 ratio (ref 0.0–4.4)
Cholesterol, Total: 161 mg/dL (ref 100–199)
HDL: 43 mg/dL (ref >39)
LDL Calculated: 84 mg/dL (ref 0–99)
Triglycerides: 172 mg/dL — ABNORMAL HIGH (ref 0–149)
VLDL Cholesterol Cal: 34 mg/dL (ref 5–40)

## 2017-12-23 ENCOUNTER — Telehealth: Payer: Self-pay | Admitting: Interventional Cardiology

## 2017-12-23 DIAGNOSIS — E7800 Pure hypercholesterolemia, unspecified: Secondary | ICD-10-CM

## 2017-12-23 DIAGNOSIS — E782 Mixed hyperlipidemia: Secondary | ICD-10-CM

## 2017-12-23 NOTE — Telephone Encounter (Signed)
New Message ° ° ° °Patient is returning call in reference to lab results. Please call.  °

## 2017-12-23 NOTE — Telephone Encounter (Signed)
Spoke with pt and went over lab results and recommendations per Dr. Tamala Julian.  Pt has tried Zetia in the past and had an intolerance.  Pt agreeable to come see [facility].  Order placed.

## 2018-01-06 ENCOUNTER — Other Ambulatory Visit: Payer: Self-pay | Admitting: Family Medicine

## 2018-01-09 ENCOUNTER — Ambulatory Visit: Payer: Medicare Other

## 2018-01-28 ENCOUNTER — Ambulatory Visit: Payer: Medicare Other

## 2018-01-29 ENCOUNTER — Other Ambulatory Visit: Payer: Medicare Other

## 2018-02-06 ENCOUNTER — Ambulatory Visit
Admission: RE | Admit: 2018-02-06 | Discharge: 2018-02-06 | Disposition: A | Discharge status: Home / Self Care | Payer: Medicare Other | Source: Ambulatory Visit | Attending: Family Medicine | Admitting: Family Medicine

## 2018-02-06 DIAGNOSIS — I6529 Occlusion and stenosis of unspecified carotid artery: Secondary | ICD-10-CM

## 2018-02-06 DIAGNOSIS — I6523 Occlusion and stenosis of bilateral carotid arteries: Secondary | ICD-10-CM | POA: Diagnosis not present

## 2018-02-06 DIAGNOSIS — I25810 Atherosclerosis of coronary artery bypass graft(s) without angina pectoris: Secondary | ICD-10-CM

## 2018-02-06 DIAGNOSIS — I10 Essential (primary) hypertension: Secondary | ICD-10-CM

## 2018-02-14 ENCOUNTER — Telehealth: Payer: Self-pay | Admitting: Interventional Cardiology

## 2018-02-14 NOTE — Telephone Encounter (Signed)
See carotid result from Dr. Tomi Bamberger.  Per Dr. Tamala Julian he would like to try pt in [facility] if agreeable.    Called pt and left message to call back.

## 2018-02-19 NOTE — Telephone Encounter (Signed)
Spoke with pt and she is agreeable to come to [facility] clinic.  Pt prefers Wednesday mornings.  Scheduled her first available on 9/25.  Pt appreciative for call.

## 2018-02-19 NOTE — Telephone Encounter (Signed)
Left message to call back  

## 2018-02-26 ENCOUNTER — Encounter: Payer: Self-pay | Admitting: Family Medicine

## 2018-02-26 ENCOUNTER — Ambulatory Visit (INDEPENDENT_AMBULATORY_CARE_PROVIDER_SITE_OTHER): Payer: Medicare Other | Admitting: Family Medicine

## 2018-02-26 VITALS — BP 144/70 | HR 72 | Temp 98.4°F | Ht 65.0 in | Wt 138.0 lb

## 2018-02-26 DIAGNOSIS — J019 Acute sinusitis, unspecified: Secondary | ICD-10-CM | POA: Diagnosis not present

## 2018-02-26 DIAGNOSIS — I6529 Occlusion and stenosis of unspecified carotid artery: Secondary | ICD-10-CM

## 2018-02-26 MED ORDER — AMOXICILLIN 500 MG PO TABS
1000.0000 mg | ORAL_TABLET | Freq: Two times a day (BID) | ORAL | 0 refills | Status: DC
Start: 2018-02-26 — End: 2018-08-20

## 2018-02-26 NOTE — Patient Instructions (Addendum)
  Drink plenty of water. Consider using guaifenesin (mucinex)--loosens up the phlegm so it drains easier (I suggest plain mucinex 12 hours, twice daily). Consider sinus rinses (sinus rinse kit or Neti-pot).   Take the antibiotics as directed. Contact us in 5 days if zero better or worse.  If improving, but not completely better by the 10th day, let us know and we can extend the course.   Overuse of Afrin can cause rebound swelling (meaning nasal stuffiness/congestion that doesn't go away, even after the infection is gone.  Restarting Flonase to help get the swelling down, will help you be able to wean off the Afrin spray.     Sinusitis, Adult Sinusitis is soreness and inflammation of your sinuses. Sinuses are hollow spaces in the bones around your face. They are located:  Around your eyes.  In the middle of your forehead.  Behind your nose.  In your cheekbones.  Your sinuses and nasal passages are lined with a stringy fluid (mucus). Mucus normally drains out of your sinuses. When your nasal tissues get inflamed or swollen, the mucus can get trapped or blocked so air cannot flow through your sinuses. This lets bacteria, viruses, and funguses grow, and that leads to infection. Follow these instructions at home: Medicines  Take, use, or apply over-the-counter and prescription medicines only as told by your doctor. These may include nasal sprays.  If you were prescribed an antibiotic medicine, take it as told by your doctor. Do not stop taking the antibiotic even if you start to feel better. Hydrate and Humidify  Drink enough water to keep your pee (urine) clear or pale yellow.  Use a cool mist humidifier to keep the humidity level in your home above 50%.  Breathe in steam for 10-15 minutes, 3-4 times a day or as told by your doctor. You can do this in the bathroom while a hot shower is running.  Try not to spend time in cool or dry air. Rest  Rest as much as possible.  Sleep  with your head raised (elevated).  Make sure to get enough sleep each night. General instructions  Put a warm, moist washcloth on your face 3-4 times a day or as told by your doctor. This will help with discomfort.  Wash your hands often with soap and water. If there is no soap and water, use hand sanitizer.  Do not smoke. Avoid being around people who are smoking (secondhand smoke).  Keep all follow-up visits as told by your doctor. This is important. Contact a doctor if:  You have a fever.  Your symptoms get worse.  Your symptoms do not get better within 10 days. Get help right away if:  You have a very bad headache.  You cannot stop throwing up (vomiting).  You have pain or swelling around your face or eyes.  You have trouble seeing.  You feel confused.  Your neck is stiff.  You have trouble breathing. This information is not intended to replace advice given to you by your health care provider. Make sure you discuss any questions you have with your health care provider. Document Released: 11/28/2007 Document Revised: 02/05/2016 Document Reviewed: 04/06/2015 Elsevier Interactive Patient Education  [employer].

## 2018-02-26 NOTE — Progress Notes (Signed)
Chief Complaint  Patient presents with  . Facial Pain    x 4 weeks. Went on cruise beginning of August and thinks she picked up something on ship. Lots of sinus pain and pressure. Green mucus, trouble sleeping. Low grade temps. No ear or throat pain.    Going on the 4th week of illness.  Got worse yesterday--low grade fever, discolored mucus. Reports facial/sinus pressure.  Drainage is sometimes discolored, sometimes yellow, never clear; yesterday was green.  +PND.  Denies cough.  Intermittent plugging of right ear, not painful.  She has been using Afrin twice daily since illness started. She has fluticasone at home, hasn't used during this illness. No other OTC medications.  PMH, PSH, SH reviewed  Outpatient Encounter Medications as of 02/26/2018  Medication Sig  . aspirin EC 81 MG tablet Take 1 tablet (81 mg total) by mouth daily.  Marland Kitchen atenolol (TENORMIN) 100 MG tablet Take 1 tablet (100 mg total) by mouth daily.  Marland Kitchen atorvastatin (LIPITOR) 10 MG tablet TAKE 1 TABLET BY MOUTH EVERY DAY  . benazepril (LOTENSIN) 40 MG tablet TAKE 1 TABLET(40 MG) BY MOUTH DAILY  . cholecalciferol (VITAMIN D) 1000 UNITS tablet Take 1,000 Units by mouth daily.  . furosemide (LASIX) 20 MG tablet Take 1 tablet (20 mg total) by mouth as needed for fluid or edema. Reported on 12/19/2015  . Glucosamine-Chondroitin-Vit D3 1500-1200-800 MG-MG-UNIT PACK Take 1,200 capsules by mouth daily. Joint pain  . hydrochlorothiazide (HYDRODIURIL) 25 MG tablet Take 1 tablet (25 mg total) by mouth daily.  . Multiple Vitamin (MULTIVITAMIN) tablet Take 1 tablet by mouth daily.  . potassium chloride (K-DUR) 10 MEQ tablet Take 10 mEq by mouth daily as needed (Take with as needed Furosemide).   No facility-administered encounter medications on file as of 02/26/2018.     Allergies  Allergen Reactions  . Zetia [Ezetimibe] Other (See Comments)    Muscle cramps  . Oxycodone Nausea And Vomiting    Extreme nausea and vomiting   ROS:   No  nausea, vomiting, diarrhea, rashes, bleeding, bruising. No dizziness, syncope, chest pain.  No headaches, just the sinus pressure, causing slight dizziness.   PHYSICAL EXAM:  BP (!) 144/70   Pulse 72   Temp 98.4 F (36.9 C) (Tympanic)   Ht '5\' 5"'  (1.651 m)   Wt 138 lb (62.6 kg)   BMI 22.96 kg/m   Well-appearing, pleasant female in no distress HEENT: conjunctiva and sclera are clear, TM's and EAC's normal. Nasal mucosa is mod-severely edematous, L>R,with erythema.  Yellow-green purulence noted on the left. Sinuses nontender.  Neck: no lymphadenopathy or mass Heart: regular rate and rhythm Lungs: clear bilaterally Extremities: no edema Skin: normal turgor, no rash Psych: normal mood, affect, hygiene and grooming   ASSESSMENT/PLAN:  Acute non-recurrent sinusitis, unspecified location - Plan: amoxicillin (AMOXIL) 500 MG tablet     Drink plenty of water. Consider using guaifenesin (mucinex)--loosens up the phlegm so it drains easier (I suggest plain mucinex 12 hours, twice daily). Consider sinus rinses (sinus rinse kit or Neti-pot).   Take the antibiotics as directed. Contact us in 5 days if zero better or worse.  If improving, but not completely better by the 10th day, let us know and we can extend the course.

## 2018-03-12 ENCOUNTER — Ambulatory Visit: Payer: Medicare Other | Admitting: Internal Medicine

## 2018-03-19 ENCOUNTER — Ambulatory Visit (INDEPENDENT_AMBULATORY_CARE_PROVIDER_SITE_OTHER): Payer: Medicare Other | Admitting: Pharmacist

## 2018-03-19 ENCOUNTER — Encounter: Payer: Self-pay | Admitting: Pharmacist

## 2018-03-19 DIAGNOSIS — I6529 Occlusion and stenosis of unspecified carotid artery: Secondary | ICD-10-CM | POA: Diagnosis not present

## 2018-03-19 DIAGNOSIS — E7800 Pure hypercholesterolemia, unspecified: Secondary | ICD-10-CM | POA: Diagnosis not present

## 2018-03-19 NOTE — Patient Instructions (Addendum)
We will send for coverage of PCSK9 inhibitor therapy (REPATHA 140mg  injection every 14 days) through the insurance. Once we have approval we will apply through the patient assistance foundation ([employer]).   Please call the clinic with questions or concerns. [phone].   Continue atorvastatin 10mg  as tolerated.   Cholesterol Cholesterol is a fat. Your body needs a small amount of cholesterol. Cholesterol (plaque) may build up in your blood vessels (arteries). That makes you more likely to have a heart attack or stroke. You cannot feel your cholesterol level. Having a blood test is the only way to find out if your level is high. Keep your test results. Work with your doctor to keep your cholesterol at a good level. What do the results mean?  Total cholesterol is how much cholesterol is in your blood.  LDL is bad cholesterol. This is the type that can build up. Try to have low LDL.  HDL is good cholesterol. It cleans your blood vessels and carries LDL away. Try to have high HDL.  Triglycerides are fat that the body can store or burn for energy. What are good levels of cholesterol?  Total cholesterol below 200.  LDL below 100 is good for people who have health risks. LDL below 70 is good for people who have very high risks.  HDL above 40 is good. It is best to have HDL of 60 or higher.  Triglycerides below 150. How can I lower my cholesterol? Diet Follow your diet program as told by your doctor.  Choose fish, white meat chicken, or [place] that is roasted or baked. Try not to eat red meat, fried foods, sausage, or lunch meats.  Eat lots of fresh fruits and vegetables.  Choose whole grains, beans, pasta, potatoes, and cereals.  Choose olive oil, corn oil, or canola oil. Only use small amounts.  Try not to eat butter, mayonnaise, shortening, or palm kernel oils.  Try not to eat foods with trans fats.  Choose low-fat or nonfat dairy foods. ? Drink skim or  nonfat milk. ? Eat low-fat or nonfat yogurt and cheeses. ? Try not to drink whole milk or cream. ? Try not to eat ice cream, egg yolks, or full-fat cheeses.  Healthy desserts include angel food cake, ginger snaps, animal crackers, hard candy, popsicles, and low-fat or nonfat frozen yogurt. Try not to eat pastries, cakes, pies, and cookies.  Exercise Follow your exercise program as told by your doctor.  Be more active. Try gardening, walking, and taking the stairs.  Ask your doctor about ways that you can be more active.  Medicine  Take over-the-counter and prescription medicines only as told by your doctor. This information is not intended to replace advice given to you by your health care provider. Make sure you discuss any questions you have with your health care provider. Document Released: 09/07/2008 Document Revised: 01/11/2016 Document Reviewed: 12/22/2015 Elsevier Interactive Patient Education  [employer].

## 2018-03-19 NOTE — Progress Notes (Signed)
Patient ID: Claudia Howell                 DOB: Sep 07, 1940                    MRN: [id number]     HPI: Claudia Howell is a 77 y.o. female patient of Dr. Tamala Julian that presents today for lipid evaluation.  PMH includes coronary artery disease with bypass graft failure, saphenous vein graft stent, hypertension, acute on chronic systolic and diastolic heart failure, history of paroxysmal atrial fibrillation, maze procedure doing coronary bypass grafting, and essential hypertension. She has had difficulty tolerating statins in the past.   She presents today for discussion of cholesterol. She states that her leg cramps on ezetimibe and rosuvastatin were so bad that she could barely walk. This improved a few days after stopping these medications. She states she is currently tolerating her atorvastatin 10mg  daily except every 2-3 weeks she has to skip 3-4 days due to muscle aches. She is willing to continue this for now.   Risk Factors: CAD s/p CABG LDL Goal: <70, nonHDL <100, TG <150  Current Medications: atorvastatin 10mg  daily  Intolerances: ezetimibe 10mg  daily (muscle cramps), rosuvastatin 10mg  daily (muscle cramps), atorvastatin 20mg  daily (muscle cramps)  Diet: Most meals prepared from home. She eats mostly chicken. She prepares mostly baked. She does eat vegetables regularly. She sautees her vegetables. She drinks mostly water. She does have coffee in the morning. She does admit to eating a decent amount of carbs.   Exercise: She rides her bike 3 times a week. She usually rides for 10-15 minutes.   Family History: Cancer in her brother; Heart attack (age of onset: 54) in her mother; Heart disease in her other; Heart disease (age of onset: 90) in her brother; Hypertension in her brother and mother.  Social History: Never smoker. She drinks socially.    Labs: 12/20/17:  TC 161, TG 172, HDL 43, LDL 84, nonHDL 118 (atorvastatin 10mg  daily except 3-4 days without per month)  Past Medical History:    Diagnosis Date  . Adenomatous colon polyp 10/09  . Coronary artery disease    Last cath 12/14 Cath 12/8 100% SVG occlusion to RCA, 95% SVG.  The stenosis to OM.  LIMA to LAD patent but diffuse disease in LAD after graft.  Overlapping stents mid/distal SVG to OM.  Marland Kitchen Decubitus ulcer of coccyx 10/22/12   1/2 inch raw open area on coccyx  . Hyperlipidemia   . Hypertension    Dr.Henry Tamala Julian  . Osteopenia 9/09  . PAF (paroxysmal atrial fibrillation) ([place])    during cath/notes 10/16/2012  . S/P CABG x 3 10/22/2012   LIMA to LAD, SVG to distal RCA, SVG to OM, EVH from right thigh  . S/P Maze operation for atrial fibrillation 10/22/2012   Left side lesion set using bipolar radiofrequency and cryothermy ablation with clipping of LA appendage    Current Outpatient Medications on File Prior to Visit  Medication Sig Dispense Refill  . amoxicillin (AMOXIL) 500 MG tablet Take 2 tablets (1,000 mg total) by mouth 2 (two) times daily. 40 tablet 0  . aspirin EC 81 MG tablet Take 1 tablet (81 mg total) by mouth daily.    Marland Kitchen atenolol (TENORMIN) 100 MG tablet Take 1 tablet (100 mg total) by mouth daily. 90 tablet 3  . atorvastatin (LIPITOR) 10 MG tablet TAKE 1 TABLET BY MOUTH EVERY DAY 90 tablet 0  . benazepril (LOTENSIN) 40  MG tablet TAKE 1 TABLET(40 MG) BY MOUTH DAILY 90 tablet 3  . cholecalciferol (VITAMIN D) 1000 UNITS tablet Take 1,000 Units by mouth daily.    . furosemide (LASIX) 20 MG tablet Take 1 tablet (20 mg total) by mouth as needed for fluid or edema. Reported on 12/19/2015 (Patient not taking: Reported on 02/26/2018) 30 tablet 4  . Glucosamine-Chondroitin-Vit D3 1500-1200-800 MG-MG-UNIT PACK Take 1,200 capsules by mouth daily. Joint pain    . hydrochlorothiazide (HYDRODIURIL) 25 MG tablet Take 1 tablet (25 mg total) by mouth daily. 90 tablet 2  . Multiple Vitamin (MULTIVITAMIN) tablet Take 1 tablet by mouth daily.    . potassium chloride (K-DUR) 10 MEQ tablet Take 10 mEq by mouth daily as needed  (Take with as needed Furosemide).     No current facility-administered medications on file prior to visit.     Allergies  Allergen Reactions  . Zetia [Ezetimibe] Other (See Comments)    Muscle cramps  . Oxycodone Nausea And Vomiting    Extreme nausea and vomiting    Assessment/Plan: Hyperlipidemia: LDL is not at goal. She is intolerant to increased doses of atorvastatin and ezetimibe. Will plan to continue atorvastatin as tolerated and will pursue PCSK9i therapy (Repatha) through insurance as this is the only option that will bring LDL to goal. Discussed risk/benefit, injection technique, and cost obligations. She did fill out patient assistance paperwork today as well.    Thank you,  Lelan Pons. Patterson Hammersmith, [facility]  03/19/2018 7:00 AM

## 2018-03-20 ENCOUNTER — Telehealth: Payer: Self-pay | Admitting: Pharmacist

## 2018-03-20 MED ORDER — EVOLOCUMAB 140 MG/ML ~~LOC~~ SOAJ: 1.0000 "pen " | SUBCUTANEOUS | 11 refills | Status: DC

## 2018-03-20 NOTE — Telephone Encounter (Signed)
Repatha prior authorization approved. Rx sent to pharmacy to assess copay.

## 2018-04-09 NOTE — Telephone Encounter (Signed)
Repatha copay affordable at $30 per month. LMOM for pt to set up lab work to assess efficacy.

## 2018-05-07 ENCOUNTER — Telehealth: Payer: Self-pay | Admitting: Interventional Cardiology

## 2018-05-07 DIAGNOSIS — H43821 Vitreomacular adhesion, right eye: Secondary | ICD-10-CM | POA: Diagnosis not present

## 2018-05-07 DIAGNOSIS — H348112 Central retinal vein occlusion, right eye, stable: Secondary | ICD-10-CM | POA: Diagnosis not present

## 2018-05-07 DIAGNOSIS — H34231 Retinal artery branch occlusion, right eye: Secondary | ICD-10-CM | POA: Diagnosis not present

## 2018-05-07 DIAGNOSIS — H35041 Retinal micro-aneurysms, unspecified, right eye: Secondary | ICD-10-CM | POA: Diagnosis not present

## 2018-05-07 MED ORDER — ATORVASTATIN CALCIUM 10 MG PO TABS: 10.0000 mg | ORAL_TABLET | Freq: Every day | ORAL | 1 refills | Status: DC

## 2018-05-07 NOTE — Telephone Encounter (Signed)
Spoke with patient who states that she does not wish to stay on the [place] as she is a "mucus making machine" and it has become quite embarrassing. She states that the only thing that has changed is the [place]. She states it has been cumulative effect and got worse after each dose. Her last dose was about 10 days ago. She will remain off Repatha. I discussed changing to Praluent and she states that LDL of less than 70 is just unreasonable. She is happy with her LDL less than 90. She stopped Lipitor, but will go back on that now that she stopped the [place].   She request refill be sent to pharmacy for atorvastatin 10mg  daily as she was unable to tolerate higher dose.

## 2018-05-07 NOTE — Telephone Encounter (Signed)
Pt c/o medication issue:  1. Name of Medication:   2. How are you currently taking this medication (dosage and times per day)? Evolocumab (REPATHA SURECLICK) 344 MG/ML SOAJ  3. Are you having a reaction (difficulty breathing--STAT)? Cold like symptoms, runny nose, she states she goes through two boxes of tissues in a week. Lots of mucus.   4. What is your medication issue? Lots of mucus. She said she is going to stop using the medication

## 2018-05-07 NOTE — Telephone Encounter (Signed)
Pt called to report that she has been having recurrent sinus problems such as increased nasal congestion, non-productive cough, and runny nose for several weeks.Claudia Howell She was treated with Amoxicillin in 02/2018 and has not had much relief.. She read about Repatha and noticed that it says "cold symptoms" so she feels confident that it is the Repatha giving her problems and wants to D/C it.Claudia KitchenMarland KitchenI advised her to wait until I forward her message to Dr. Rise Patience... To get their advice and recommendations. Pt verbalized understanding and agreed.

## 2018-05-08 ENCOUNTER — Other Ambulatory Visit (INDEPENDENT_AMBULATORY_CARE_PROVIDER_SITE_OTHER): Payer: Medicare Other

## 2018-05-08 DIAGNOSIS — Z23 Encounter for immunization: Secondary | ICD-10-CM | POA: Diagnosis not present

## 2018-05-08 NOTE — Telephone Encounter (Signed)
We tried hard. Agree with accepting less than ideal LDL since patient is unwilling to try alternative.

## 2018-05-12 ENCOUNTER — Other Ambulatory Visit: Payer: Self-pay

## 2018-07-10 DIAGNOSIS — M7130 Other bursal cyst, unspecified site: Secondary | ICD-10-CM | POA: Diagnosis not present

## 2018-07-10 DIAGNOSIS — L570 Actinic keratosis: Secondary | ICD-10-CM | POA: Diagnosis not present

## 2018-07-10 DIAGNOSIS — D485 Neoplasm of uncertain behavior of skin: Secondary | ICD-10-CM | POA: Diagnosis not present

## 2018-07-10 DIAGNOSIS — L821 Other seborrheic keratosis: Secondary | ICD-10-CM | POA: Diagnosis not present

## 2018-07-10 DIAGNOSIS — D044 Carcinoma in situ of skin of scalp and neck: Secondary | ICD-10-CM | POA: Diagnosis not present

## 2018-07-17 ENCOUNTER — Encounter: Payer: Self-pay | Admitting: Family Medicine

## 2018-07-31 ENCOUNTER — Other Ambulatory Visit: Payer: Self-pay | Admitting: Interventional Cardiology

## 2018-08-20 ENCOUNTER — Encounter: Payer: Self-pay | Admitting: Interventional Cardiology

## 2018-08-20 ENCOUNTER — Ambulatory Visit (INDEPENDENT_AMBULATORY_CARE_PROVIDER_SITE_OTHER): Payer: Medicare Other | Admitting: Interventional Cardiology

## 2018-08-20 VITALS — BP 154/78 | HR 80 | Ht 65.0 in | Wt 138.4 lb

## 2018-08-20 DIAGNOSIS — I25718 Atherosclerosis of autologous vein coronary artery bypass graft(s) with other forms of angina pectoris: Secondary | ICD-10-CM

## 2018-08-20 DIAGNOSIS — Z9889 Other specified postprocedural states: Secondary | ICD-10-CM | POA: Diagnosis not present

## 2018-08-20 DIAGNOSIS — I480 Paroxysmal atrial fibrillation: Secondary | ICD-10-CM

## 2018-08-20 DIAGNOSIS — I5032 Chronic diastolic (congestive) heart failure: Secondary | ICD-10-CM | POA: Diagnosis not present

## 2018-08-20 DIAGNOSIS — Z8679 Personal history of other diseases of the circulatory system: Secondary | ICD-10-CM | POA: Diagnosis not present

## 2018-08-20 DIAGNOSIS — I10 Essential (primary) hypertension: Secondary | ICD-10-CM

## 2018-08-20 MED ORDER — NITROGLYCERIN 0.4 MG SL SUBL: 0.4000 mg | SUBLINGUAL_TABLET | SUBLINGUAL | 3 refills | Status: DC | PRN

## 2018-08-20 MED ORDER — ISOSORBIDE MONONITRATE ER 30 MG PO TB24: 30.0000 mg | ORAL_TABLET | Freq: Every day | ORAL | 3 refills | Status: DC

## 2018-08-20 NOTE — Patient Instructions (Addendum)
Medication Instructions:  Your physician has recommended you make the following change in your medication:   1. START: isosorbide mononitrate (imdur) 30 mg tablet: Take 1 tablet by mouth once a day  2. USE Sublingual Nitroglycerin tablets AS NEEDED for chest pain. Use as directed   Lab work: None ordered If you have labs (blood work) drawn today and your tests are completely normal, you will receive your results only by: Marland Kitchen MyChart Message (if you have MyChart) OR . A paper copy in the mail If you have any lab test that is abnormal or we need to change your treatment, we will call you to review the results.  Testing/Procedures: None ordered  Follow-Up: . Follow up with Dr. Tamala Julian on 09/22/18 at 10:40 AM   Any Other Special Instructions Will Be Listed Below (If Applicable).

## 2018-08-20 NOTE — Progress Notes (Signed)
Cardiology Office Note:    Date:  08/20/2018   ID:  Claudia Howell, DOB 08/17/1940, MRN [id number]  PCP:  Rita Ohara, MD  Cardiologist:  Sinclair Grooms, MD   Referring MD: Rita Ohara, MD   Chief Complaint  Patient presents with  . Coronary Artery Disease    Angina pectoris    History of Present Illness:    Claudia Howell is a 78 y.o. female with a hx of coronary artery disease with bypass graft (January/2014), saphenous vein graft stent (10/2012), hypertension, acute on chronic systolic and diastolic heart failure, history of paroxysmal atrial fibrillation, Maze procedure doing coronary bypass grafting, and essential hypertension.  With the past 3 months the patient has begun experiencing bilateral arm and chest discomfort with moderate physical activity.  Onset is occurring more frequently with less activity.  She does not have any nitro and requests a prescription.  She is usually compliant with her medical regimen although after noting elevated blood pressure today she remembers that she has had none of her therapy yet today.  She has not had angina today.  She had a early bypass graft occlusion in 2014 after coronary bypass grafting that resolved after stenting.  Current discomfort is similar to what she had before.  She is not excited about the possibility of coronary angiography and prefers to see if we can improve her clinical condition with medical therapy.   Past Medical History:  Diagnosis Date  . Adenomatous colon polyp 10/09  . Coronary artery disease    Last cath 12/14 Cath 12/8 100% SVG occlusion to RCA, 95% SVG.  The stenosis to OM.  LIMA to LAD patent but diffuse disease in LAD after graft.  Overlapping stents mid/distal SVG to OM.  Marland Kitchen Decubitus ulcer of coccyx 10/22/12   1/2 inch raw open area on coccyx  . Hyperlipidemia   . Hypertension    Dr.Millee Denise Tamala Julian  . Osteopenia 9/09  . PAF (paroxysmal atrial fibrillation) ([place])    during cath/notes 10/16/2012  . S/P CABG x 3  10/22/2012   LIMA to LAD, SVG to distal RCA, SVG to OM, EVH from right thigh  . S/P Maze operation for atrial fibrillation 10/22/2012   Left side lesion set using bipolar radiofrequency and cryothermy ablation with clipping of LA appendage    Past Surgical History:  Procedure Laterality Date  . BREAST CYST EXCISION Left 1980-1990's   x 3  . CARDIAC CATHETERIZATION  10/16/2012  . CATARACT EXTRACTION, BILATERAL Bilateral    03/2015 and 05/2015  . CORONARY ANGIOPLASTY WITH STENT PLACEMENT  04/01/2013   "1" (06/01/2013)  . CORONARY ARTERY BYPASS GRAFT N/A 10/22/2012   Procedure: CORONARY ARTERY BYPASS GRAFTING (CABG);  Surgeon: Rexene Alberts, MD;  Location: [place];  Service: Open Heart Surgery;  Laterality: N/A;  . DILATION AND CURETTAGE OF UTERUS    . INTRAOPERATIVE TRANSESOPHAGEAL ECHOCARDIOGRAM N/A 10/22/2012   Procedure: INTRAOPERATIVE TRANSESOPHAGEAL ECHOCARDIOGRAM;  Surgeon: Rexene Alberts, MD;  Location: [place];  Service: Open Heart Surgery;  Laterality: N/A;  . LEFT HEART CATHETERIZATION WITH CORONARY ANGIOGRAM N/A 10/16/2012   Procedure: LEFT HEART CATHETERIZATION WITH CORONARY ANGIOGRAM;  Surgeon: Sinclair Grooms, MD;  Location[facility] CATH LAB;  Service: Cardiovascular;  Laterality: N/A;  . LEFT HEART CATHETERIZATION WITH CORONARY/GRAFT ANGIOGRAM N/A 06/01/2013   Procedure: LEFT HEART CATHETERIZATION WITH Beatrix Fetters;  Surgeon: Sinclair Grooms, MD;  Location: [facility] CATH LAB;  Service: Cardiovascular;  Laterality: N/A;  . MAZE  N/A 10/22/2012   Procedure: MAZE;  Surgeon: Rexene Alberts, MD;  Location: [place];  Service: Open Heart Surgery;  Laterality: N/A;  . PERCUTANEOUS CORONARY STENT INTERVENTION (PCI-S)  06/01/2013   Procedure: PERCUTANEOUS CORONARY STENT INTERVENTION (PCI-S);  Surgeon: Sinclair Grooms, MD;  Location: [facility] CATH LAB;  Service: Cardiovascular;;  . TONSILLECTOMY  1949  . TOTAL ABDOMINAL HYSTERECTOMY W/ BILATERAL SALPINGOOPHORECTOMY  1990   benign growth     Current Medications: Current Meds  Medication Sig  . aspirin EC 81 MG tablet Take 1 tablet (81 mg total) by mouth daily.  Marland Kitchen atenolol (TENORMIN) 100 MG tablet Take 1 tablet (100 mg total) by mouth daily.  Marland Kitchen atorvastatin (LIPITOR) 10 MG tablet Take 1 tablet (10 mg total) by mouth daily.  . benazepril (LOTENSIN) 40 MG tablet TAKE 1 TABLET(40 MG) BY MOUTH DAILY  . cholecalciferol (VITAMIN D) 1000 UNITS tablet Take 1,000 Units by mouth daily.  . furosemide (LASIX) 20 MG tablet Take 1 tablet (20 mg total) by mouth as needed for fluid or edema. Reported on 12/19/2015  . Glucosamine-Chondroitin-Vit D3 1500-1200-800 MG-MG-UNIT PACK Take 1,200 capsules by mouth daily. Joint pain  . hydrochlorothiazide (HYDRODIURIL) 25 MG tablet TAKE 1 TABLET(25 MG) BY MOUTH DAILY  . Multiple Vitamin (MULTIVITAMIN) tablet Take 1 tablet by mouth daily.  . potassium chloride (K-DUR) 10 MEQ tablet Take 10 mEq by mouth daily as needed (Take with as needed Furosemide).     Allergies:   Zetia [ezetimibe] and Oxycodone   Social History   Socioeconomic History  . Marital status: Widowed    Spouse name: Not on file  . Number of children: 2  . Years of education: Not on file  . Highest education level: Not on file  Occupational History  . Occupation: retired    Comment: LPN  Social Needs  . Financial resource strain: Not on file  . Food insecurity:    Worry: Not on file    Inability: Not on file  . Transportation needs:    Medical: Not on file    Non-medical: Not on file  Tobacco Use  . Smoking status: Never Smoker  . Smokeless tobacco: Never Used  Substance and Sexual Activity  . Alcohol use: Yes    Alcohol/week: 1.0 standard drinks    Types: 1 Glasses of wine per week    Comment: 1 glass of wine 1 times/week  . Drug use: No  . Sexual activity: Not Currently  Lifestyle  . Physical activity:    Days per week: Not on file    Minutes per session: Not on file  . Stress: Not on file  Relationships  .  Social connections:    Talks on phone: Not on file    Gets together: Not on file    Attends religious service: Not on file    Active member of club or organization: Not on file    Attends meetings of clubs or organizations: Not on file    Relationship status: Not on file  Other Topics Concern  . 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  1 son in [place], 1 son in [place]. 7 grandchildren, 2 great granddaughters (in [place])     Family History: The patient's family history includes Cancer in her brother; Heart attack (age of onset: 51) in her mother; Heart disease in an other family member; Heart disease (age of onset: 4) in her brother; Hypertension in her  brother and mother. There is no history of Diabetes, Breast cancer, or Colon cancer.  ROS:   Please see the history of present illness.    Anxiety, some depression, having significant personal problems that she does not discuss in any detail.  All other systems reviewed and are negative.  EKGs/Labs/Other Studies Reviewed:    The following studies were reviewed today: No new data  EKG:  EKG normal sinus rhythm, prominent voltage, poor R wave progression.  No acute ST-T wave change and no significant difference when compared to prior  Recent Labs: 09/18/2017: ALT 17; BUN 16; Creatinine, Ser 0.98; Hemoglobin 13.2; Platelets 200; Potassium 3.8; Sodium 140; TSH 1.340  Recent Lipid Panel    Component Value Date/Time   CHOL 161 12/20/2017 0819   TRIG 172 (H) 12/20/2017 0819   HDL 43 12/20/2017 0819   CHOLHDL 3.7 12/20/2017 0819   CHOLHDL 3.4 09/06/2016 1146   VLDL 28 09/06/2016 1146   LDLCALC 84 12/20/2017 0819    Physical Exam:    VS:  BP (!) 154/78   Pulse 80   Ht 5\' 5"  (1.651 m)   Wt 138 lb 6.4 oz (62.8 kg)   SpO2 97%   BMI 23.03 kg/m     Wt Readings from Last 3 Encounters:  08/20/18 138 lb 6.4 oz (62.8 kg)  02/26/18 138 lb (62.6 kg)  10/02/17 137 lb 12.8 oz (62.5 kg)     GEN: Elevated  blood pressure sad appearing. No acute distress HEENT: Normal NECK: No JVD. LYMPHATICS: No lymphadenopathy CARDIAC: RRR.  No murmur, no gallop, no edema VASCULAR: 2+ bilateral radial pulses, no bruits RESPIRATORY:  Clear to auscultation without rales, wheezing or rhonchi  ABDOMEN: Soft, non-tender, non-distended, No pulsatile mass, MUSCULOSKELETAL: No deformity  SKIN: Warm and dry NEUROLOGIC:  Alert and oriented x 3 PSYCHIATRIC:  Normal affect   ASSESSMENT:    1. Coronary artery disease involving autologous vein coronary bypass graft with other forms of angina pectoris ([place])   2. Chronic diastolic heart failure ([facility])   3. Paroxysmal atrial fibrillation ([place])   4. Essential hypertension, benign   5. S/P Maze operation for atrial fibrillation    PLAN:    In order of problems listed above:  1. Nitroglycerin sublingually as needed.  Add isosorbide mononitrate 30 mg/day.  Use nitro liberally to relieve discomfort if it lasts longer than 3 to 5 minutes.  Spontaneously occurring angina or angina that is not relieved promptly should warrant an emergency room visit.  I discussed the likely progression of this and the requirement for coronary angiography.  She prefers medical therapy if at all possible. 2. No evidence of volume overload. 3. Clinically in normal sinus rhythm. 4. Blood pressure is elevated today and as noted above, she has not had any of her 4 antihypertensive agents today.  Clinical follow-up in 1 month.  Will likely end up needing coronary angiography.  Isosorbide mononitrate 30 mg/day.  If still having angina after 2 to 5 days, increase to 60 mg/day.  Overall education and awareness concerning primary/secondary risk prevention was discussed in detail: LDL less than 70, hemoglobin A1c less than 7, blood pressure target less than 130/80 mmHg, >150 minutes of moderate aerobic activity per week, avoidance of smoking, weight control (via diet and exercise), and continued  surveillance/management of/for obstructive sleep apnea.     Medication Adjustments/Labs and Tests Ordered: Current medicines are reviewed at length with the patient today.  Concerns regarding medicines are outlined above.  No orders  of the defined types were placed in this encounter.  No orders of the defined types were placed in this encounter.   There are no Patient Instructions on file for this visit.   Signed, Sinclair Grooms, MD  08/20/2018 2:19 PM    [facility]

## 2018-08-23 ENCOUNTER — Other Ambulatory Visit: Payer: Self-pay | Admitting: Interventional Cardiology

## 2018-09-11 ENCOUNTER — Encounter: Payer: Self-pay | Admitting: Interventional Cardiology

## 2018-09-18 ENCOUNTER — Telehealth: Payer: Self-pay | Admitting: *Deleted

## 2018-09-18 NOTE — Telephone Encounter (Signed)
Left message to call back  

## 2018-09-18 NOTE — Telephone Encounter (Signed)
-----   Message from Belva Crome, MD sent at 09/18/2018  2:09 PM EDT ----- Regarding: Follow-up She needs a telephone or telemedicine follow-up.  I do not think she needs to come to the office.

## 2018-09-19 NOTE — Telephone Encounter (Signed)
Spoke with pt and she is agreeable to a telephone visit on Monday.  Ok to call anytime after 11A.  Advised pt to have her BP and HR available at time of call.  Pt verbalized understanding.

## 2018-09-22 ENCOUNTER — Telehealth (INDEPENDENT_AMBULATORY_CARE_PROVIDER_SITE_OTHER): Payer: Medicare Other | Admitting: Interventional Cardiology

## 2018-09-22 ENCOUNTER — Encounter: Payer: Self-pay | Admitting: Interventional Cardiology

## 2018-09-22 VITALS — BP 169/76 | HR 65 | Ht 65.0 in | Wt 136.0 lb

## 2018-09-22 DIAGNOSIS — I209 Angina pectoris, unspecified: Secondary | ICD-10-CM

## 2018-09-22 DIAGNOSIS — I5032 Chronic diastolic (congestive) heart failure: Secondary | ICD-10-CM | POA: Diagnosis not present

## 2018-09-22 DIAGNOSIS — I480 Paroxysmal atrial fibrillation: Secondary | ICD-10-CM

## 2018-09-22 DIAGNOSIS — E7800 Pure hypercholesterolemia, unspecified: Secondary | ICD-10-CM | POA: Diagnosis not present

## 2018-09-22 DIAGNOSIS — I25711 Atherosclerosis of autologous vein coronary artery bypass graft(s) with angina pectoris with documented spasm: Secondary | ICD-10-CM

## 2018-09-22 DIAGNOSIS — I10 Essential (primary) hypertension: Secondary | ICD-10-CM

## 2018-09-22 DIAGNOSIS — Z951 Presence of aortocoronary bypass graft: Secondary | ICD-10-CM

## 2018-09-22 MED ORDER — ISOSORBIDE MONONITRATE ER 60 MG PO TB24: 60.0000 mg | ORAL_TABLET | Freq: Every day | ORAL | 3 refills | Status: DC

## 2018-09-22 NOTE — Telephone Encounter (Signed)
Virtual Visit Pre-Appointment Phone Call  Steps For Call:  1. Confirm consent - "In the setting of the current Covid19 crisis, you are scheduled for a (phone or video) visit with your provider on (date) at (time).  Just as we do with many in-office visits, in order for you to participate in this visit, we must obtain consent.  If you'd like, I can send this to your mychart (if signed up) or email for you to review.  Otherwise, I can obtain your verbal consent now.  All virtual visits are billed to your insurance company just like a normal visit would be.  By agreeing to a virtual visit, we'd like you to understand that the technology does not allow for your provider to perform an examination, and thus may limit your provider's ability to fully assess your condition.  Finally, though the technology is pretty good, we cannot assure that it will always work on either your or our end, and in the setting of a video visit, we may have to convert it to a phone-only visit.  In either situation, we cannot ensure that we have a secure connection.  Are you willing to proceed?"  2. Give patient instructions for WebEx download to smartphone as below if video visit  3. Advise patient to be prepared with any vital sign or heart rhythm information, their current medicines, and a piece of paper and pen handy for any instructions they may receive the day of their visit  4. Inform patient they will receive a phone call 15 minutes prior to their appointment time (may be from unknown caller ID) so they should be prepared to answer  5. Confirm that appointment type is correct in Epic appointment notes (video vs telephone)    TELEPHONE CALL NOTE  Claudia Howell has been deemed a candidate for a follow-up tele-health visit to limit community exposure during the Covid-19 pandemic. I spoke with the patient via phone to ensure availability of phone/video source, confirm preferred email & phone number, and discuss  instructions and expectations.  I reminded Claudia Howell to be prepared with any vital sign and/or heart rhythm information that could potentially be obtained via home monitoring, at the time of her visit. I reminded Claudia Howell to expect a phone call at the time of her visit if her visit.  Did the patient verbally acknowledge consent to treatment? Yes  Caitrin Pendergraph, [facility] 09/22/2018 10:40 AM   DOWNLOADING THE WEBEX SOFTWARE TO SMARTPHONE  - If Apple, go to [employer] and type in WebEx in the search bar. [employer], the blue/green circle. The app is free but as with any other app downloads, their phone may require them to verify saved payment information or Apple password. The patient does NOT have to create an account.  - If Android, ask patient to go to [employer] and type in WebEx in the search bar. [employer], the blue/green circle. The app is free but as with any other app downloads, their phone may require them to verify saved payment information or Android password. The patient does NOT have to create an account.   CONSENT FOR TELE-HEALTH VISIT - PLEASE REVIEW  I hereby voluntarily request, consent and authorize [facility] and its employed or contracted physicians, physician assistants, nurse practitioners or other licensed health care professionals (the Practitioner), to provide me with telemedicine health care services (the "Services") as deemed necessary by the treating Practitioner.  I acknowledge and consent to receive the Services by the Practitioner via telemedicine. I understand that the telemedicine visit will involve communicating with the Practitioner through live audiovisual communication technology and the disclosure of certain medical information by electronic transmission. I acknowledge that I have been given the opportunity to request an in-person assessment or other available alternative prior to the telemedicine visit and am  voluntarily participating in the telemedicine visit.  I understand that I have the right to withhold or withdraw my consent to the use of telemedicine in the course of my care at any time, without affecting my right to future care or treatment, and that the Practitioner or I may terminate the telemedicine visit at any time. I understand that I have the right to inspect all information obtained and/or recorded in the course of the telemedicine visit and may receive copies of available information for a reasonable fee.  I understand that some of the potential risks of receiving the Services via telemedicine include:  Marland Kitchen Delay or interruption in medical evaluation due to technological equipment failure or disruption; . Information transmitted may not be sufficient (e.g. poor resolution of images) to allow for appropriate medical decision making by the Practitioner; and/or  . In rare instances, security protocols could fail, causing a breach of personal health information.  Furthermore, I acknowledge that it is my responsibility to provide information about my medical history, conditions and care that is complete and accurate to the best of my ability. I acknowledge that Practitioner's advice, recommendations, and/or decision may be based on factors not within their control, such as incomplete or inaccurate data provided by me or distortions of diagnostic images or specimens that may result from electronic transmissions. I understand that the practice of medicine is not an exact science and that Practitioner makes no warranties or guarantees regarding treatment outcomes. I acknowledge that I will receive a copy of this consent concurrently upon execution via email to the email address I last provided but may also request a printed copy by calling the office of [place].    I understand that my insurance will be billed for this visit.   I have read or had this consent read to me. . I understand the  contents of this consent, which adequately explains the benefits and risks of the Services being provided via telemedicine.  . I have been provided ample opportunity to ask questions regarding this consent and the Services and have had my questions answered to my satisfaction. . I give my informed consent for the services to be provided through the use of telemedicine in my medical care  By participating in this telemedicine visit I agree to the above.

## 2018-09-22 NOTE — Progress Notes (Signed)
Virtual Visit via Telephone Note    Evaluation Performed:  Follow-up visit  This visit type was conducted due to national recommendations for restrictions regarding the COVID-19 Pandemic (e.g. social distancing).  This format is felt to be most appropriate for this patient at this time.  All issues noted in this document were discussed and addressed.  No physical exam was performed (except for noted visual exam findings with Video Visits).  Please refer to the patient's chart (MyChart message for video visits and phone note for telephone visits) for the patient's consent to telehealth for [facility].  Date:  09/22/2018   ID:  Claudia Howell, DOB July 20, 1940, MRN [id number]  Patient Location:  Home  Provider location:   [place]  PCP:  Rita Ohara, MD  Cardiologist:  Sinclair Grooms, MD  Electrophysiologist:  None   Chief Complaint: Angina pectoris  History of Present Illness:    Claudia Howell is a 78 y.o. female who presents via audio conferencing for a telehealth visit today.    Claudia Howell has coronary artery disease with bypass graft (January/2014), saphenous vein graft stent (10/2012), hypertension, acute on chronic systolic and diastolic heart failure, history of paroxysmal atrial fibrillation, Maze procedure doing coronary bypass grafting, and essential hypertension.  Over the past 2 to 3 months she has noticed increasing discomfort with physical activity.  The discomfort feels like "indigestion".  It is precipitated by walking distances, emotions including anger or bad news.  She has been using an acid therapy but on occasion has used sublingual nitroglycerin.  Sublingual nitroglycerin has worked.  This history was obtained on her most recent office visit at the end of February 2020.  At that time isosorbide mononitrate was started.  Episodes of discomfort have improved/become less frequent since starting isosorbide mononitrate.  Prior to adding Imdur 30 mg daily, she was using an  acids daily.  Now she states 2 to 3 days/week is her only requirement.  The patient does not symptoms concerning for COVID-19 infection (fever, chills, cough, or new shortness of breath).    Prior CV studies:   The following studies were reviewed today:  No new cardiovascular imaging.  Past Medical History:  Diagnosis Date   Adenomatous colon polyp 10/09   Coronary artery disease    Last cath 12/14 Cath 12/8 100% SVG occlusion to RCA, 95% SVG.  The stenosis to OM.  LIMA to LAD patent but diffuse disease in LAD after graft.  Overlapping stents mid/distal SVG to OM.   Decubitus ulcer of coccyx 10/22/12   1/2 inch raw open area on coccyx   Hyperlipidemia    Hypertension    Dr.Ardenia Stiner Tamala Julian   Osteopenia 9/09   PAF (paroxysmal atrial fibrillation) ([place])    during cath/notes 10/16/2012   S/P CABG x 3 10/22/2012   LIMA to LAD, SVG to distal RCA, SVG to OM, EVH from right thigh   S/P Maze operation for atrial fibrillation 10/22/2012   Left side lesion set using bipolar radiofrequency and cryothermy ablation with clipping of LA appendage   Past Surgical History:  Procedure Laterality Date   BREAST CYST EXCISION Left 1980-1990's   x 3   CARDIAC CATHETERIZATION  10/16/2012   CATARACT EXTRACTION, BILATERAL Bilateral    03/2015 and 05/2015   CORONARY ANGIOPLASTY WITH STENT PLACEMENT  04/01/2013   "1" (06/01/2013)   CORONARY ARTERY BYPASS GRAFT N/A 10/22/2012   Procedure: CORONARY ARTERY BYPASS GRAFTING (CABG);  Surgeon: Rexene Alberts, MD;  [facility]  OR;  Service: Open Heart Surgery;  Laterality: N/A;   DILATION AND CURETTAGE OF UTERUS     INTRAOPERATIVE TRANSESOPHAGEAL ECHOCARDIOGRAM N/A 10/22/2012   Procedure: INTRAOPERATIVE TRANSESOPHAGEAL ECHOCARDIOGRAM;  Surgeon: Rexene Alberts, MD;  Location: [place] [place];  Service: Open Heart Surgery;  Laterality: N/A;   LEFT HEART CATHETERIZATION WITH CORONARY ANGIOGRAM N/A 10/16/2012   Procedure: LEFT HEART CATHETERIZATION WITH CORONARY  ANGIOGRAM;  Surgeon: Sinclair Grooms, MD;  Location[facility] LAB;  Service: Cardiovascular;  Laterality: N/A;   LEFT HEART CATHETERIZATION WITH CORONARY/GRAFT ANGIOGRAM N/A 06/01/2013   Procedure: LEFT HEART CATHETERIZATION WITH Beatrix Fetters;  Surgeon: Sinclair Grooms, MD;  Location[facility] C[facility] LAB;  Service: Cardiovascular;  Laterality: N/A;   MAZE N/A 10/22/2012   Procedure: MAZE;  Surgeon: Rexene Alberts, MD;  Location: [place];  Service: Open Heart Surgery;  Laterality: N/A;   PERCUTANEOUS CORONARY STENT INTERVENTION (PCI-S)  06/01/2013   Procedure: PERCUTANEOUS CORONARY STENT INTERVENTION (PCI-S);  Surgeon: Sinclair Grooms, MD;  Location: [facility] CATH LAB;  Service: Cardiovascular;;   TONSILLECTOMY  1949   TOTAL ABDOMINAL HYSTERECTOMY W/ BILATERAL SALPINGOOPHORECTOMY  1990   benign growth     Current Meds  Medication Sig   aspirin EC 81 MG tablet Take 1 tablet (81 mg total) by mouth daily.   atenolol (TENORMIN) 100 MG tablet Take 1 tablet (100 mg total) by mouth daily.   atorvastatin (LIPITOR) 10 MG tablet Take 1 tablet (10 mg total) by mouth daily.   benazepril (LOTENSIN) 40 MG tablet TAKE 1 TABLET(40 MG) BY MOUTH DAILY   cholecalciferol (VITAMIN D) 1000 UNITS tablet Take 1,000 Units by mouth daily.   furosemide (LASIX) 20 MG tablet Take 1 tablet (20 mg total) by mouth as needed for fluid or edema. Reported on 12/19/2015   Glucosamine-Chondroitin-Vit D3 1500-1200-800 MG-MG-UNIT PACK Take 1,200 capsules by mouth daily. Joint pain   hydrochlorothiazide (HYDRODIURIL) 25 MG tablet TAKE 1 TABLET(25 MG) BY MOUTH DAILY   isosorbide mononitrate (IMDUR) 30 MG 24 hr tablet Take 1 tablet (30 mg total) by mouth daily.   Multiple Vitamin (MULTIVITAMIN) tablet Take 1 tablet by mouth daily.   nitroGLYCERIN (NITROSTAT) 0.4 MG SL tablet Place 1 tablet (0.4 mg total) under the tongue every 5 (five) minutes as needed for chest pain.   potassium chloride (K-DUR) 10 MEQ tablet Take 10  mEq by mouth daily as needed (Take with as needed Furosemide).     Allergies:   Zetia [ezetimibe] and Oxycodone   Social History   Tobacco Use   Smoking status: Never Smoker   Smokeless tobacco: Never Used  Substance Use Topics   Alcohol use: Yes    Alcohol/week: 1.0 standard drinks    Types: 1 Glasses of wine per week    Comment: 1 glass of wine 1 times/week   Drug use: No     Family Hx: The patient's family history includes Cancer in her brother; Heart attack (age of onset: 33) in her mother; Heart disease in an other family member; Heart disease (age of onset: 48) in her brother; Hypertension in her brother and mother. There is no history of Diabetes, Breast cancer, or Colon cancer.  ROS:   Please see the history of present illness.    Denies dyspnea, edema, palpitations, syncope, orthopnea, and PND. All other systems reviewed and are negative.   Labs/Other Tests and Data Reviewed:    Recent Labs: No results found for requested labs within last 8760 hours.  Recent Lipid Panel Lab Results  Component Value Date/Time   CHOL 161 12/20/2017 08:19 AM   TRIG 172 (H) 12/20/2017 08:19 AM   HDL 43 12/20/2017 08:19 AM   CHOLHDL 3.7 12/20/2017 08:19 AM   CHOLHDL 3.4 09/06/2016 11:46 AM   LDLCALC 84 12/20/2017 08:19 AM    Wt Readings from Last 3 Encounters:  09/22/18 136 lb (61.7 kg)  08/20/18 138 lb 6.4 oz (62.8 kg)  02/26/18 138 lb (62.6 kg)     Exam:    Vital Signs:  BP (!) 169/76    Pulse 65    Ht 5\' 5"  (1.651 m)    Wt 136 lb (61.7 kg)    BMI 22.63 kg/m    Well nourished, well developed female in no acute distress. She notices no edema.  Otherwise unable to perform exam.  ASSESSMENT & PLAN:    1. Angina, class III ([place])   2. Chronic diastolic heart failure (HCC)   3. Pure hypercholesterolemia   4. Paroxysmal atrial fibrillation (HCC)   5. S/P CABG x 3   6. Essential hypertension, benign    PLAN in order of problems listed above:  1. Increase  isosorbide mononitrate to 60 mg/day.  Use sublingual nitroglycerin if discomfort is not relieved by rest. 2. No symptoms to suggest volume overload. 3. No change in therapy for lipids.  She cannot tolerate high intensity statin therapy and has refused PCSK9 therapy due to cost. 4. Not currently an issue. 5. Suspected to have further bypass graft occlusion or progression of native disease. 6. Target blood pressure 130/80 mmHg.  The increase in isosorbide will slightly help blood pressure.  We discussed low-salt diet and exercise as natural measures to help lower the blood pressure.  COVID-19 Education: The signs and symptoms of COVID-19 were discussed with the patient and how to seek care for testing (follow up with PCP or arrange E-visit).  The importance of social distancing was discussed today.  Patient Risk:   After full review of this patients clinical status, I feel that they are at least moderate risk at this time.  Time:   Today, I have spent 15 minutes with the patient with telehealth technology discussing angina pectoris and intensification of therapy..     Medication Adjustments/Labs and Tests Ordered: Current medicines are reviewed at length with the patient today.  Concerns regarding medicines are outlined above.  Tests Ordered: No orders of the defined types were placed in this encounter.  Medication Changes: No orders of the defined types were placed in this encounter.   Disposition:  Follow up in 3 month(s)  Signed, Sinclair Grooms, MD  09/22/2018 11:15 AM    [place]

## 2018-09-22 NOTE — Patient Instructions (Signed)
Medication Instructions:  1) INCREASE Imdur to 60mg  once daily  If you need a refill on your cardiac medications before your next appointment, please call your pharmacy.   Lab work: None If you have labs (blood work) drawn today and your tests are completely normal, you will receive your results only by: Marland Kitchen MyChart Message (if you have MyChart) OR . A paper copy in the mail If you have any lab test that is abnormal or we need to change your treatment, we will call you to review the results.  Testing/Procedures: None  Follow-Up: At [facility], you and your health needs are our priority.  As part of our continuing mission to provide you with exceptional heart care, we have created designated Provider Care Teams.  These Care Teams include your primary Cardiologist (physician) and Advanced Practice Providers (APPs -  Physician Assistants and Nurse Practitioners) who all work together to provide you with the care you need, when you need it. You will need a follow up appointment in 3 months.  Please call our office 2 months in advance to schedule this appointment.  You may see Sinclair Grooms, MD or one of the following Advanced Practice Providers on your designated Care Team:   Truitt Merle, NP Cecilie Kicks, NP . Kathyrn Drown, NP  Any Other Special Instructions Will Be Listed Below (If Applicable).  Please contact the office if your symptoms worsen.

## 2018-09-23 NOTE — Patient Instructions (Addendum)
  Ms. Kerrigan , Thank you for taking time to come for your Medicare Wellness Visit. I appreciate your ongoing commitment to your health goals. Please review the following plan we discussed and let me know if I can assist you in the future.   This is a list of the screening recommended for you and due dates:  Health Maintenance  Topic Date Due  . Colon Cancer Screening  10/28/2017  . Tetanus Vaccine  01/24/2019  . Flu Shot  Completed  . DEXA scan (bone density measurement)  Completed  . Pneumonia vaccines  Completed   You are past due for your follow-up colonoscopy (3 year follow-up was recommended due to you having multiple polyps on last check in 2016).  Please contact Dr. Kathline Magic office to arrange for this when it is safe to do so (this summer?).  It was recommended to have another bone density test last year (I recommended a 5 year follow-up to the study you had)--you had very mild thinning of the bones on last check. You declined repeat the study, as you would not take any treatments if osteoporosis was found.  You likely have had some  worsening in the thinning of the bones over time, so be sure to get at least 1200mg  of calcium daily in your diet, get at least 1000 IU of vitamin D3, and do upper and lower body weight-bearing exercise 2-3 times/week to help keep your bones strong.  You will be due for another tetanus booster in August.  You need to get this from the pharmacy ([facility]), as it is covered under your medicare Part D benefit (not covered for [place] to give it to you in the office).  Continue yearly mammograms, due next in May (okay to delay until safe to leave your home to get one).  Please get [place] copies of your living will and healthcare power of attorney at your convenience, so that they can be scanned into your medical chart.

## 2018-09-23 NOTE — Progress Notes (Signed)
Virtual Visit via Video Note   I connected with Claudia Howell on 09/24/2018 by a video enabled telemedicine application (Zoom), and verified that I am speaking with the correct person using two identifiers. Patient is at home alone MD is in office.  Visit start time 9:45 Visit end time 10:30   I discussed the limitations of evaluation and management by telemedicine and the availability of in person appointments. The patient expressed understanding and agreed to proceed. Today's visit is for [facility] Annual Wellness Visit, no physical exam required.  Chief Complaint  Patient presents with  . Medicare Wellness    medicare AWV visit, no concerns. Had visit with Dr.Smith Monday and he increaded her isosorbide from 30mg  to 60.     Claudia Howell is a 78 y.o. female who presents for annual wellness visit and follow-up on chronic medical conditions.  She has the following concerns:  She has been seeing her cardiologist recently for issues with discomfort (tightness in her arm) with physical activity over the last few months. She tried antacids (which did seem to help some), followed by trying SL nitro, which was effective.  She has h/o CAD, s/p CABG.  Her BP's have been above goal recently.  Dr. Tamala Julian started her on isosorbide mononitrate in February, and discomfort diminished some (less frequent).  At her virtual follow-up visit earlier this week, her isosorbide dose was increased to 60mg . She had a headache the first couple of days, but resolved. She is now taking tylenol along with the isosorbide (just once daily), and hasn't had further headaches.  Headaches at the lower dose resolved after 5 days, so she will not continue to take the tylenol regularly. Denies other side effects. BP's have also been lower. Value in VS below was taken on home monitor this morning.  She has hyperlipidemia, with intolerance to zetia and limited tolerance to statins.  She is currently on atorvastatin 10mg . Goal  LDL is <70, last was above this. She tried Repatha x 3 doses through the lipid clinic, but didn't tolerate due to cold symptoms (which she looked up and saw was a common side effect). These resolved a few days after stopping medication.  Lab Results  Component Value Date   CHOL 161 12/20/2017   HDL 43 12/20/2017   LDLCALC 84 12/20/2017   TRIG 172 (H) 12/20/2017   CHOLHDL 3.7 12/20/2017    Immunization History  Administered Date(s) Administered  . Influenza Split 05/07/2012  . Influenza, High Dose Seasonal PF 04/22/2013, 03/25/2014, 04/08/2015, 04/13/2016, 03/27/2017, 05/08/2018  . Pneumococcal Conjugate-13 09/09/2014  . Pneumococcal Polysaccharide-23 06/30/2009  . Td 06/25/2002  . Tdap 01/23/2009  . Zoster 01/24/2008  . Zoster Recombinat (Shingrix) 03/14/2017, 05/24/2017   Last Pap smear: n/a (hysterectomy for benign reasons) Last mammogram: 10[facility]2017 [facility]) Last colonoscopy:10/2014--multiple tubular adenomas; repeat was due 10/2017 (65yr) Last DEXA: 09/2011 (T-1.1 L fem neck)--[facility]; I had recommended 5 yr f/u DEXA, but pt declined, as she would not take any treatments Exercise: 20 minutes daily (crunches, squats, and stationery bike (10 minutes on the bike).  Has  3# and 5# weights, uses sporadically. Dentist: twice yearly Ophtho: sees retinal specialist every 6 months.  Other doctors caring for patient include: Cardiology: Dr. Tamala Julian Ophtho: Dr. Peter Garter Retinal specialist: Dr. Zadie Rhine Dentist: Dr. Raelyn Ensign GI: Dr. Michail Sermon Derm: Dr. Jarome Matin  Depression screen:negative Fall screen: negative Functional status screen:unremarkable Mini-Cog screen score of 5  (clock drawing shown over video) See full questionnaires in Epic  End of Life Discussion: Patient hasa living will and medical power of attorney  Past Medical History:  Diagnosis Date  . Adenomatous colon polyp 10/09  . Coronary artery disease    Last cath 12/14 Cath 12/8 100% SVG occlusion to RCA, 95%  SVG.  The stenosis to OM.  LIMA to LAD patent but diffuse disease in LAD after graft.  Overlapping stents mid/distal SVG to OM.  Marland Kitchen Decubitus ulcer of coccyx 10/22/12   1/2 inch raw open area on coccyx  . Hyperlipidemia   . Hypertension    Dr.Henry Tamala Julian  . Osteopenia 9/09  . PAF (paroxysmal atrial fibrillation) ([place])    during cath/notes 10/16/2012  . S/P CABG x 3 10/22/2012   LIMA to LAD, SVG to distal RCA, SVG to OM, EVH from right thigh  . S/P Maze operation for atrial fibrillation 10/22/2012   Left side lesion set using bipolar radiofrequency and cryothermy ablation with clipping of LA appendage    Past Surgical History:  Procedure Laterality Date  . BREAST CYST EXCISION Left 1980-1990's   x 3  . CARDIAC CATHETERIZATION  10/16/2012  . CATARACT EXTRACTION, BILATERAL Bilateral    03/2015 and 05/2015  . CATARACT EXTRACTION, BILATERAL Bilateral approx 2016  . CORONARY ANGIOPLASTY WITH STENT PLACEMENT  04/01/2013   "1" (06/01/2013)  . CORONARY ARTERY BYPASS GRAFT N/A 10/22/2012   Procedure: CORONARY ARTERY BYPASS GRAFTING (CABG);  Surgeon: Rexene Alberts, MD;  Location: [place];  Service: Open Heart Surgery;  Laterality: N/A;  . DILATION AND CURETTAGE OF UTERUS    . INTRAOPERATIVE TRANSESOPHAGEAL ECHOCARDIOGRAM N/A 10/22/2012   Procedure: INTRAOPERATIVE TRANSESOPHAGEAL ECHOCARDIOGRAM;  Surgeon: Rexene Alberts, MD;  Location: [place];  Service: Open Heart Surgery;  Laterality: N/A;  . LEFT HEART CATHETERIZATION WITH CORONARY ANGIOGRAM N/A 10/16/2012   Procedure: LEFT HEART CATHETERIZATION WITH CORONARY ANGIOGRAM;  Surgeon: Sinclair Grooms, MD;  [facility] CATH LAB;  Service: Cardiovascular;  Laterality: N/A;  . LEFT HEART CATHETERIZATION WITH CORONARY/GRAFT ANGIOGRAM N/A 06/01/2013   Procedure: LEFT HEART CATHETERIZATION WITH Beatrix Fetters;  Surgeon: Sinclair Grooms, MD;  Location: [facility] CATH LAB;  Service: Cardiovascular;  Laterality: N/A;  . MAZE N/A 10/22/2012   Procedure: MAZE;   Surgeon: Rexene Alberts, MD;  Location: [place];  Service: Open Heart Surgery;  Laterality: N/A;  . PERCUTANEOUS CORONARY STENT INTERVENTION (PCI-S)  06/01/2013   Procedure: PERCUTANEOUS CORONARY STENT INTERVENTION (PCI-S);  Surgeon: Sinclair Grooms, MD;  Location: [facility] CATH LAB;  Service: Cardiovascular;;  . TONSILLECTOMY  1949  . TOTAL ABDOMINAL HYSTERECTOMY W/ BILATERAL SALPINGOOPHORECTOMY  1990   benign growth    Social History   Socioeconomic History  . Marital status: Widowed    Spouse name: Not on file  . Number of children: 2  . Years of education: Not on file  . Highest education level: Not on file  Occupational History  . Occupation: retired    Comment: LPN  Social Needs  . Financial resource strain: Not on file  . Food insecurity:    Worry: Not on file    Inability: Not on file  . Transportation needs:    Medical: Not on file    Non-medical: Not on file  Tobacco Use  . Smoking status: Never Smoker  . Smokeless tobacco: Never Used  Substance and Sexual Activity  . Alcohol use: Yes    Alcohol/week: 1.0 standard drinks    Types: 1 Glasses of wine per week  Comment: 1 glass of wine 1 times/week  . Drug use: No  . Sexual activity: Not Currently  Lifestyle  . Physical activity:    Days per week: Not on file    Minutes per session: Not on file  . Stress: Not on file  Relationships  . Social connections:    Talks on phone: Not on file    Gets together: Not on file    Attends religious service: Not on file    Active member of club or organization: Not on file    Attends meetings of clubs or organizations: Not on file    Relationship status: Not on file  . Intimate partner violence:    Fear of current or ex partner: Not on file    Emotionally abused: Not on file    Physically abused: Not on file    Forced sexual activity: Not on file  Other Topics Concern  . 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  1 son in [place], 1 son in  [place]. 7 grandchildren, 2 great granddaughters (in [place]), one more on the way    Family History  Problem Relation Age of Onset  . Hypertension Mother   . Heart attack Mother 49  . Hypertension Brother   . Cancer Brother        prostate  . Heart disease Brother 39       stent  . Heart disease Other        x2 (age 5 and 36)  . Diabetes Neg Hx   . Breast cancer Neg Hx   . Colon cancer Neg Hx     Outpatient Encounter Medications as of 09/24/2018  Medication Sig  . aspirin EC 81 MG tablet Take 1 tablet (81 mg total) by mouth daily.  Marland Kitchen atenolol (TENORMIN) 100 MG tablet Take 1 tablet (100 mg total) by mouth daily.  Marland Kitchen atorvastatin (LIPITOR) 10 MG tablet Take 1 tablet (10 mg total) by mouth daily.  . benazepril (LOTENSIN) 40 MG tablet TAKE 1 TABLET(40 MG) BY MOUTH DAILY  . cholecalciferol (VITAMIN D) 1000 UNITS tablet Take 1,000 Units by mouth daily.  . Glucosamine-Chondroitin-Vit D3 1500-1200-800 MG-MG-UNIT PACK Take 1,200 capsules by mouth daily. Joint pain  . hydrochlorothiazide (HYDRODIURIL) 25 MG tablet TAKE 1 TABLET(25 MG) BY MOUTH DAILY  . isosorbide mononitrate (IMDUR) 60 MG 24 hr tablet Take 1 tablet (60 mg total) by mouth daily.  . Multiple Vitamin (MULTIVITAMIN) tablet Take 1 tablet by mouth daily.  . potassium chloride (K-DUR) 10 MEQ tablet Take 10 mEq by mouth daily as needed.   . furosemide (LASIX) 20 MG tablet Take 1 tablet (20 mg total) by mouth as needed for fluid or edema. Reported on 12/19/2015 (Patient not taking: Reported on 09/24/2018)  . nitroGLYCERIN (NITROSTAT) 0.4 MG SL tablet Place 1 tablet (0.4 mg total) under the tongue every 5 (five) minutes as needed for chest pain. (Patient not taking: Reported on 09/24/2018)  . [DISCONTINUED] isosorbide mononitrate (IMDUR) 30 MG 24 hr tablet Take 1 tablet (30 mg total) by mouth daily.   No facility-administered encounter medications on file as of 09/24/2018.     Allergies  Allergen Reactions  . Zetia [Ezetimibe] Other (See  Comments)    Muscle cramps  . Oxycodone Nausea And Vomiting    Extreme nausea and vomiting   ROS: The patient denies anorexia, fever, headaches (other than as related to isosorbide per HPI, resolved), decreased hearing, ear  pain, sore throat, breast concerns, palpitations, dizziness, syncope, dyspnea on exertion, cough, swelling, nausea, vomiting, diarrhea, constipation, abdominal pain, melena, hematochezia, indigestion/heartburn, hematuria, incontinence, dysuria, vaginal bleeding, discharge, odor or itch, genital lesions, joint pains, weakness, tremor, suspicious skin lesions, depression, anxiety, abnormal bleeding/bruising, or enlarged lymph nodes. +some trouble falling asleep; chamomille tea helps.  CTS/tingling in hands at night have resolved, no longer wears the braces to sleep. Tightness in arm with exertion (walking) prior to starting the isosorbide, none since dose increased.  Denies heartburn, indigestion.  PHYSICAL EXAM:  BP 122/62   Pulse 67   Ht 5\' 5"  (1.651 m)   Wt 136 lb (61.7 kg)   BMI 22.63 kg/m   Wt Readings from Last 3 Encounters:  09/22/18 136 lb (61.7 kg)  08/20/18 138 lb 6.4 oz (62.8 kg)  02/26/18 138 lb (62.6 kg)   Exam not performed due to virtual nature of the visit. Per video, she appears well, in good spirits.  Normal eye contact, speech, hygiene and grooming.     ASSESSMENT/PLAN:  Medicare annual wellness visit, subsequent  Coronary artery disease involving autologous vein coronary bypass graft with angina pectoris ([place]) - angina improved since isosorbide dose increased.  BP's improved as well.   Essential hypertension, benign - controlled on current regimen--BP's improved since on isosorbide 60mg . Cont home monitoring  Osteopenia, unspecified location - very mild, declines recheck. Encouraged Ca, D, weight-bearing exercise   Discussed monthly self breast exams and yearly mammograms; at least 30 minutes of aerobic activity at least 5 days/week, plus  weight-bearing exercise at least 2x/week; proper sunscreen use reviewed; healthy diet, including goals of calcium and vitamin D intake and alcohol recommendations (less than or equal to 1 drink/day) reviewed; regular seatbelt use; changing batteries in smoke detectors. Immunization recommendations discussed. Continue yearly high dose flu shots. Td or Tdap due next year, to get from pharmacy.Colonoscopy recommendations reviewed--past due.  To contact Dr. Michail Sermon to arrange for colonoscopy once it is safe to do so (after COVID-19 pandemic)  MOST form reviewed and unchanged (unable to sign pink form, due to it being a virtual visit).  She is Full Code, Full Care  Medicare Attestation I have personally reviewed: The patient's medical and social history Their use of alcohol, tobacco or illicit drugs Their current medications and supplements The patient's functional ability including ADLs,fall risks, home safety risks, cognitive, and hearing and visual impairment Diet and physical activities Evidence for depression or mood disorders.  Patient was advised how to access her AVS through MyChart, and recommendations were reviewed with patient.  Patient needs to set up med checkJ+ in 3 months (do breast/pelvic at that visit).   I provided 45 minutes of non-face-to-face time (video) during this encounter.   Vikki Ports, MD

## 2018-09-24 ENCOUNTER — Encounter: Payer: Self-pay | Admitting: Family Medicine

## 2018-09-24 ENCOUNTER — Other Ambulatory Visit: Payer: Self-pay

## 2018-09-24 ENCOUNTER — Ambulatory Visit (INDEPENDENT_AMBULATORY_CARE_PROVIDER_SITE_OTHER): Payer: Medicare Other | Admitting: Family Medicine

## 2018-09-24 VITALS — BP 122/62 | HR 67 | Ht 65.0 in | Wt 136.0 lb

## 2018-09-24 DIAGNOSIS — M8580 Other specified disorders of bone density and structure, unspecified site: Secondary | ICD-10-CM

## 2018-09-24 DIAGNOSIS — I10 Essential (primary) hypertension: Secondary | ICD-10-CM

## 2018-09-24 DIAGNOSIS — Z0000 Encounter for general adult medical examination without abnormal findings: Secondary | ICD-10-CM | POA: Diagnosis not present

## 2018-09-24 DIAGNOSIS — I25719 Atherosclerosis of autologous vein coronary artery bypass graft(s) with unspecified angina pectoris: Secondary | ICD-10-CM

## 2018-09-25 NOTE — Progress Notes (Signed)
Left message for pt to call back  °

## 2018-09-26 NOTE — Progress Notes (Signed)
Pt called back and made an appt

## 2018-09-29 ENCOUNTER — Other Ambulatory Visit: Payer: Self-pay | Admitting: Interventional Cardiology

## 2018-10-09 DIAGNOSIS — Z85828 Personal history of other malignant neoplasm of skin: Secondary | ICD-10-CM | POA: Diagnosis not present

## 2018-10-09 DIAGNOSIS — L570 Actinic keratosis: Secondary | ICD-10-CM | POA: Diagnosis not present

## 2018-10-30 ENCOUNTER — Other Ambulatory Visit: Payer: Self-pay | Admitting: Interventional Cardiology

## 2018-10-30 MED ORDER — HYDROCHLOROTHIAZIDE 25 MG PO TABS: ORAL_TABLET | ORAL | 3 refills | Status: DC

## 2018-10-31 ENCOUNTER — Other Ambulatory Visit: Payer: Self-pay | Admitting: Interventional Cardiology

## 2018-11-05 DIAGNOSIS — H348112 Central retinal vein occlusion, right eye, stable: Secondary | ICD-10-CM | POA: Diagnosis not present

## 2018-11-05 DIAGNOSIS — H43821 Vitreomacular adhesion, right eye: Secondary | ICD-10-CM | POA: Diagnosis not present

## 2018-11-05 DIAGNOSIS — H35041 Retinal micro-aneurysms, unspecified, right eye: Secondary | ICD-10-CM | POA: Diagnosis not present

## 2018-11-05 DIAGNOSIS — H34231 Retinal artery branch occlusion, right eye: Secondary | ICD-10-CM | POA: Diagnosis not present

## 2018-12-15 ENCOUNTER — Telehealth: Payer: Self-pay

## 2018-12-15 NOTE — Progress Notes (Signed)
Cardiology Office Note:    Date:  12/16/2018   ID:  Claudia Howell, DOB [birth year], MRN [id number]  PCP:  Rita Ohara, MD  Cardiologist:  Sinclair Grooms, MD   Referring MD: Rita Ohara, MD   Chief Complaint  Patient presents with  . Coronary Artery Disease    History of Present Illness:    Claudia Howell is a 78 y.o. female with a hx of coronary artery disease with bypass graft (January/2014), saphenous vein graft stent (10/2012), hypertension, acute on chronic systolic and diastolic heart failure, history of paroxysmal atrial fibrillation, Maze procedure doing coronary bypass grafting, and essential hypertension.  Despite adding isosorbide mononitrate which improved anginal great, that she is gradually developing physically restricting angina pectoris.  Nitroglycerin use is frequent occasional episodes occur at rest.  She has not had orthopnea, PND, melena, and hematochezia.   Past Medical History:  Diagnosis Date  . Adenomatous colon polyp 10/09  . Coronary artery disease    Last cath 12/14 Cath 12/8 100% SVG occlusion to RCA, 95% SVG.  The stenosis to OM.  LIMA to LAD patent but diffuse disease in LAD after graft.  Overlapping stents mid/distal SVG to OM.  Marland Kitchen Decubitus ulcer of coccyx 10/22/12   1/2 inch raw open area on coccyx  . Hyperlipidemia   . Hypertension    Dr.Sameul Tagle Tamala Julian  . Osteopenia 9/09  . PAF (paroxysmal atrial fibrillation) ([place])    during cath/notes 10/16/2012  . S/P CABG x 3 10/22/2012   LIMA to LAD, SVG to distal RCA, SVG to OM, EVH from right thigh  . S/P Maze operation for atrial fibrillation 10/22/2012   Left side lesion set using bipolar radiofrequency and cryothermy ablation with clipping of LA appendage    Past Surgical History:  Procedure Laterality Date  . BREAST CYST EXCISION Left 1980-1990's   x 3  . CARDIAC CATHETERIZATION  10/16/2012  . CATARACT EXTRACTION, BILATERAL Bilateral    03/2015 and 05/2015  . CATARACT EXTRACTION, BILATERAL Bilateral  approx 2016  . CORONARY ANGIOPLASTY WITH STENT PLACEMENT  04/01/2013   "1" (06/01/2013)  . CORONARY ARTERY BYPASS GRAFT N/A 10/22/2012   Procedure: CORONARY ARTERY BYPASS GRAFTING (CABG);  Surgeon: Rexene Alberts, MD;  Location: [facility];  Service: Open Heart Surgery;  Laterality: N/A;  . DILATION AND CURETTAGE OF UTERUS    . INTRAOPERATIVE TRANSESOPHAGEAL ECHOCARDIOGRAM N/A 10/22/2012   Procedure: INTRAOPERATIVE TRANSESOPHAGEAL ECHOCARDIOGRAM;  Surgeon: Rexene Alberts, MD;  Location: [facility];  Service: Open Heart Surgery;  Laterality: N/A;  . LEFT HEART CATHETERIZATION WITH CORONARY ANGIOGRAM N/A 10/16/2012   Procedure: LEFT HEART CATHETERIZATION WITH CORONARY ANGIOGRAM;  Surgeon: Sinclair Grooms, MD;  Location[facility] C[facility] LAB;  Service: Cardiovascular;  Laterality: N/A;  . LEFT HEART CATHETERIZATION WITH CORONARY/GRAFT ANGIOGRAM N/A 06/01/2013   Procedure: LEFT HEART CATHETERIZATION WITH Beatrix Fetters;  Surgeon: Sinclair Grooms, MD;  Location: [facility] CATH LAB;  Service: Cardiovascular;  Laterality: N/A;  . MAZE N/A 10/22/2012   Procedure: MAZE;  Surgeon: Rexene Alberts, MD;  Location: [place];  Service: Open Heart Surgery;  Laterality: N/A;  . PERCUTANEOUS CORONARY STENT INTERVENTION (PCI-S)  06/01/2013   Procedure: PERCUTANEOUS CORONARY STENT INTERVENTION (PCI-S);  Surgeon: Sinclair Grooms, MD;  Location: [facility] CATH LAB;  Service: Cardiovascular;;  . TONSILLECTOMY  1949  . TOTAL ABDOMINAL HYSTERECTOMY W/ BILATERAL SALPINGOOPHORECTOMY  1990   benign growth    Current Medications: Current Meds  Medication Sig  .  aspirin EC 81 MG tablet Take 1 tablet (81 mg total) by mouth daily.  Marland Kitchen atenolol (TENORMIN) 100 MG tablet TAKE 1 TABLET(100 MG) BY MOUTH DAILY  . atorvastatin (LIPITOR) 10 MG tablet TAKE 1 TABLET BY MOUTH DAILY  . benazepril (LOTENSIN) 40 MG tablet TAKE 1 TABLET(40 MG) BY MOUTH DAILY  . cholecalciferol (VITAMIN D) 1000 UNITS tablet Take 1,000 Units by mouth daily.  . furosemide  (LASIX) 20 MG tablet Take 1 tablet (20 mg total) by mouth as needed for fluid or edema. Reported on 12/19/2015  . Glucosamine-Chondroitin-Vit D3 1500-1200-800 MG-MG-UNIT PACK Take 1,200 capsules by mouth daily. Joint pain  . hydrochlorothiazide (HYDRODIURIL) 25 MG tablet TAKE 1 TABLET(25 MG) BY MOUTH DAILY  . Multiple Vitamin (MULTIVITAMIN) tablet Take 1 tablet by mouth daily.  . nitroGLYCERIN (NITROSTAT) 0.4 MG SL tablet Place 1 tablet (0.4 mg total) under the tongue every 5 (five) minutes as needed for chest pain.  . potassium chloride (K-DUR) 10 MEQ tablet Take 10 mEq by mouth daily as needed.   . [DISCONTINUED] isosorbide mononitrate (IMDUR) 60 MG 24 hr tablet Take 1 tablet (60 mg total) by mouth daily.     Allergies:   Zetia [ezetimibe] and Oxycodone   Social History   Socioeconomic History  . Marital status: Widowed    Spouse name: Not on file  . Number of children: 2  . Years of education: Not on file  . Highest education level: Not on file  Occupational History  . Occupation: retired    Comment: LPN  Social Needs  . Financial resource strain: Not on file  . Food insecurity    Worry: Not on file    Inability: Not on file  . Transportation needs    Medical: Not on file    Non-medical: Not on file  Tobacco Use  . Smoking status: Never Smoker  . Smokeless tobacco: Never Used  Substance and Sexual Activity  . Alcohol use: Yes    Alcohol/week: 1.0 standard drinks    Types: 1 Glasses of wine per week    Comment: 1 glass of wine 1 times/week  . Drug use: No  . Sexual activity: Not Currently  Lifestyle  . Physical activity    Days per week: Not on file    Minutes per session: Not on file  . Stress: Not on file  Relationships  . Social Herbalist on phone: Not on file    Gets together: Not on file    Attends religious service: Not on file    Active member of club or organization: Not on file    Attends meetings of clubs or organizations: Not on file     Relationship status: Not on file  Other Topics Concern  . 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  1 son in [place], 1 son in [place]. 7 grandchildren, 2 great granddaughters (in [place]), one more on the way     Family History: The patient's family history includes Cancer in her brother; Heart attack (age of onset: 40) in her mother; Heart disease in an other family member; Heart disease (age of onset: 29) in her brother; Hypertension in her brother and mother. There is no history of Diabetes, Breast cancer, or Colon cancer.  ROS:   Please see the history of present illness.    No urine or stool.  All other systems reviewed and are negative.  EKGs/Labs/Other Studies Reviewed:  The following studies were reviewed today: No new data  EKG:  EKG has not been repeated since February 2020 with her tracing was relatively normal in appearance.  Recent Labs: No results found for requested labs within last 8760 hours.  Recent Lipid Panel    Component Value Date/Time   CHOL 161 12/20/2017 0819   TRIG 172 (H) 12/20/2017 0819   HDL 43 12/20/2017 0819   CHOLHDL 3.7 12/20/2017 0819   CHOLHDL 3.4 09/06/2016 1146   VLDL 28 09/06/2016 1146   LDLCALC 84 12/20/2017 0819    Physical Exam:    VS:  BP (!) 144/68   Pulse 72   Ht 5\' 5"  (1.651 m)   Wt 139 lb 6.4 oz (63.2 kg)   SpO2 95%   BMI 23.20 kg/m     Wt Readings from Last 3 Encounters:  12/16/18 139 lb 6.4 oz (63.2 kg)  09/24/18 136 lb (61.7 kg)  09/22/18 136 lb (61.7 kg)     GEN: . No acute distress HEENT: Normal NECK: No JVD. LYMPHATICS: No lymphadenopathy CARDIAC: RRR.  No murmur, no gallop, no edema VASCULAR: 2+ bilateral radial and dorsalis pedis pulses, no bruits RESPIRATORY:  Clear to auscultation without rales, wheezing or rhonchi  ABDOMEN: Soft, non-tender, non-distended, No pulsatile mass, MUSCULOSKELETAL: No deformity  SKIN: Warm and dry NEUROLOGIC:  Alert and oriented x 3 PSYCHIATRIC:   Normal affect   ASSESSMENT:    1. Coronary artery disease of bypass graft of native heart with stable angina pectoris ([place])   2. Chronic diastolic heart failure (HCC)   3. Pure hypercholesterolemia   4. Paroxysmal atrial fibrillation (HCC)   5. Essential hypertension, benign   6. Obstruction of carotid artery, unspecified laterality    PLAN:    In order of problems listed above:  1. She has progressive angina pectoris despite aggressive medical therapy.  Quality of life is significantly diminished due to the episodes of angina.  Both severity of discomfort and radiation of pain have increased.  Plan coronary angiography, bypass graft angiography, and possible PCI depending upon anatomy.  Pending collateral we will increase isosorbide mononitrate to 120 mg/day and continue high-dose atenolol. 2. Volume overload.   3. Unable to treat lipids due to statin intolerance and inability to go forward PCSK9 therapy. 4. No episodes of atrial fibrillation since surgery. 5. Blood pressure is back in reasonable range: 140/70 mmHg today.  The patient was counseled to undergo left heart catheterization, coronary angiography, and possible percutaneous coronary intervention with stent implantation. The procedural risks and benefits were discussed in detail. The risks discussed included death, stroke, myocardial infarction, life-threatening bleeding, limb ischemia, kidney injury, allergy, and possible emergency cardiac surgery. The risk of these significant complications were estimated to occur less than 1% of the time. After discussion, the patient has agreed to proceed.    Medication Adjustments/Labs and Tests Ordered: Current medicines are reviewed at length with the patient today.  Concerns regarding medicines are outlined above.  Orders Placed This Encounter  Procedures  . Basic metabolic panel  . CBC   Meds ordered this encounter  Medications  . isosorbide mononitrate (IMDUR) 120 MG 24 hr tablet     Sig: Take 1 tablet (120 mg total) by mouth daily.    Dispense:  90 tablet    Refill:  3    Dose change    Patient Instructions  Medication Instructions:  1) INCREASE Isosorbide to 120mg  once daily  If you need a refill on your cardiac  medications before your next appointment, please call your pharmacy.   Lab work: BMET and CBC today If you have labs (blood work) drawn today and your tests are completely normal, you will receive your results only by: Marland Kitchen MyChart Message (if you have MyChart) OR . A paper copy in the mail If you have any lab test that is abnormal or we need to change your treatment, we will call you to review the results.  Testing/Procedures: Your physician has requested that you have a cardiac catheterization. Cardiac catheterization is used to diagnose and/or treat various heart conditions. Doctors may recommend this procedure for a number of different reasons. The most common reason is to evaluate chest pain. Chest pain can be a symptom of coronary artery disease (CAD), and cardiac catheterization can show whether plaque is narrowing or blocking your heart's arteries. This procedure is also used to evaluate the valves, as well as measure the blood flow and oxygen levels in different parts of your heart. For further information please visit [email or web address]. Please follow instruction sheet, as given.   Follow-Up: At [facility], you and your health needs are our priority.  As part of our continuing mission to provide you with exceptional heart care, we have created designated Provider Care Teams.  These Care Teams include your primary Cardiologist (physician) and Advanced Practice Providers (APPs -  Physician Assistants and Nurse Practitioners) who all work together to provide you with the care you need, when you need it. You will need a follow up appointment in 2 weeks after cath.  Please call our office 2 months in advance to schedule this appointment.  You may see  Sinclair Grooms, MD or one of the following Advanced Practice Providers on your designated Care Team:   Truitt Merle, NP Cecilie Kicks, NP . Kathyrn Drown, NP  Any Other Special Instructions Will Be Listed Below (If Applicable).      [place] OFFICE [place], [place] [address] Dept: [phone] Loc: [phone]  Claudia Howell  12/16/2018  You are scheduled for a Cardiac Catheterization on Thursday, July 9 with Dr. Daneen Schick.  1. Please arrive at the [facility] (Main Entrance A) at [facility]: [address] [place], [place] [address] at 5:30 AM (This time is two hours before your procedure to ensure your preparation). Free valet parking service is available.   Special note: Every effort is made to have your procedure done on time. Please understand that emergencies sometimes delay scheduled procedures.  2. Diet: Do not eat solid foods after midnight.  The patient may have clear liquids until 5am upon the day of the procedure.  3. Labs: You will have labs drawn today.   4. Medication instructions in preparation for your procedure:  Do not take your Furosemide, Hydrochlorothiazide or Potassium the morning of your test.    Contrast Allergy: No   On the morning of your procedure, take your Aspirin and any morning medicines NOT listed above.  You may use sips of water.  5. Plan for one night stay--bring personal belongings. 6. Bring a current list of your medications and current insurance cards. 7. You MUST have a responsible person to drive you home. 8. Someone MUST be with you the first 24 hours after you arrive home or your discharge will be delayed. 9. Please wear clothes that are easy to get on and off and wear slip-on shoes.  Thank you for allowing [place] to care for you!   -- [facility]     Signed, Sinclair Grooms, MD   12/16/2018 3:21 PM    [place]

## 2018-12-15 NOTE — H&P (View-Only) (Signed)
Cardiology Office Note:    Date:  12/16/2018   ID:  Claudia Howell, DOB Oct 18, 1940, MRN [id number]  PCP:  Rita Ohara, MD  Cardiologist:  Sinclair Grooms, MD   Referring MD: Rita Ohara, MD   Chief Complaint  Patient presents with  . Coronary Artery Disease    History of Present Illness:    Claudia Howell is a 78 y.o. female with a hx of coronary artery disease with bypass graft (January/2014), saphenous vein graft stent (10/2012), hypertension, acute on chronic systolic and diastolic heart failure, history of paroxysmal atrial fibrillation, Maze procedure doing coronary bypass grafting, and essential hypertension.  Despite adding isosorbide mononitrate which improved anginal great, that she is gradually developing physically restricting angina pectoris.  Nitroglycerin use is frequent occasional episodes occur at rest.  She has not had orthopnea, PND, melena, and hematochezia.   Past Medical History:  Diagnosis Date  . Adenomatous colon polyp 10/09  . Coronary artery disease    Last cath 12/14 Cath 12/8 100% SVG occlusion to RCA, 95% SVG.  The stenosis to OM.  LIMA to LAD patent but diffuse disease in LAD after graft.  Overlapping stents mid/distal SVG to OM.  Marland Kitchen Decubitus ulcer of coccyx 10/22/12   1/2 inch raw open area on coccyx  . Hyperlipidemia   . Hypertension    Dr.Xylah Early Tamala Julian  . Osteopenia 9/09  . PAF (paroxysmal atrial fibrillation) ([place])    during cath/notes 10/16/2012  . S/P CABG x 3 10/22/2012   LIMA to LAD, SVG to distal RCA, SVG to OM, EVH from right thigh  . S/P Maze operation for atrial fibrillation 10/22/2012   Left side lesion set using bipolar radiofrequency and cryothermy ablation with clipping of LA appendage    Past Surgical History:  Procedure Laterality Date  . BREAST CYST EXCISION Left 1980-1990's   x 3  . CARDIAC CATHETERIZATION  10/16/2012  . CATARACT EXTRACTION, BILATERAL Bilateral    03/2015 and 05/2015  . CATARACT EXTRACTION, BILATERAL Bilateral  approx 2016  . CORONARY ANGIOPLASTY WITH STENT PLACEMENT  04/01/2013   "1" (06/01/2013)  . CORONARY ARTERY BYPASS GRAFT N/A 10/22/2012   Procedure: CORONARY ARTERY BYPASS GRAFTING (CABG);  Surgeon: Rexene Alberts, MD;  Location: [place];  Service: Open Heart Surgery;  Laterality: N/A;  . DILATION AND CURETTAGE OF UTERUS    . INTRAOPERATIVE TRANSESOPHAGEAL ECHOCARDIOGRAM N/A 10/22/2012   Procedure: INTRAOPERATIVE TRANSESOPHAGEAL ECHOCARDIOGRAM;  Surgeon: Rexene Alberts, MD;  Location: [facility];  Service: Open Heart Surgery;  Laterality: N/A;  . LEFT HEART CATHETERIZATION WITH CORONARY ANGIOGRAM N/A 10/16/2012   Procedure: LEFT HEART CATHETERIZATION WITH CORONARY ANGIOGRAM;  Surgeon: Sinclair Grooms, MD;  Location[facility] CATH LAB;  Service: Cardiovascular;  Laterality: N/A;  . LEFT HEART CATHETERIZATION WITH CORONARY/GRAFT ANGIOGRAM N/A 06/01/2013   Procedure: LEFT HEART CATHETERIZATION WITH Beatrix Fetters;  Surgeon: Sinclair Grooms, MD;  Location: [facility] CATH LAB;  Service: Cardiovascular;  Laterality: N/A;  . MAZE N/A 10/22/2012   Procedure: MAZE;  Surgeon: Rexene Alberts, MD;  Location: [place];  Service: Open Heart Surgery;  Laterality: N/A;  . PERCUTANEOUS CORONARY STENT INTERVENTION (PCI-S)  06/01/2013   Procedure: PERCUTANEOUS CORONARY STENT INTERVENTION (PCI-S);  Surgeon: Sinclair Grooms, MD;  Location: [facility] CATH LAB;  Service: Cardiovascular;;  . TONSILLECTOMY  1949  . TOTAL ABDOMINAL HYSTERECTOMY W/ BILATERAL SALPINGOOPHORECTOMY  1990   benign growth    Current Medications: Current Meds  Medication Sig  .  aspirin EC 81 MG tablet Take 1 tablet (81 mg total) by mouth daily.  Marland Kitchen atenolol (TENORMIN) 100 MG tablet TAKE 1 TABLET(100 MG) BY MOUTH DAILY  . atorvastatin (LIPITOR) 10 MG tablet TAKE 1 TABLET BY MOUTH DAILY  . benazepril (LOTENSIN) 40 MG tablet TAKE 1 TABLET(40 MG) BY MOUTH DAILY  . cholecalciferol (VITAMIN D) 1000 UNITS tablet Take 1,000 Units by mouth daily.  . furosemide  (LASIX) 20 MG tablet Take 1 tablet (20 mg total) by mouth as needed for fluid or edema. Reported on 12/19/2015  . Glucosamine-Chondroitin-Vit D3 1500-1200-800 MG-MG-UNIT PACK Take 1,200 capsules by mouth daily. Joint pain  . hydrochlorothiazide (HYDRODIURIL) 25 MG tablet TAKE 1 TABLET(25 MG) BY MOUTH DAILY  . Multiple Vitamin (MULTIVITAMIN) tablet Take 1 tablet by mouth daily.  . nitroGLYCERIN (NITROSTAT) 0.4 MG SL tablet Place 1 tablet (0.4 mg total) under the tongue every 5 (five) minutes as needed for chest pain.  . potassium chloride (K-DUR) 10 MEQ tablet Take 10 mEq by mouth daily as needed.   . [DISCONTINUED] isosorbide mononitrate (IMDUR) 60 MG 24 hr tablet Take 1 tablet (60 mg total) by mouth daily.     Allergies:   Zetia [ezetimibe] and Oxycodone   Social History   Socioeconomic History  . Marital status: Widowed    Spouse name: Not on file  . Number of children: 2  . Years of education: Not on file  . Highest education level: Not on file  Occupational History  . Occupation: retired    Comment: LPN  Social Needs  . Financial resource strain: Not on file  . Food insecurity    Worry: Not on file    Inability: Not on file  . Transportation needs    Medical: Not on file    Non-medical: Not on file  Tobacco Use  . Smoking status: Never Smoker  . Smokeless tobacco: Never Used  Substance and Sexual Activity  . Alcohol use: Yes    Alcohol/week: 1.0 standard drinks    Types: 1 Glasses of wine per week    Comment: 1 glass of wine 1 times/week  . Drug use: No  . Sexual activity: Not Currently  Lifestyle  . Physical activity    Days per week: Not on file    Minutes per session: Not on file  . Stress: Not on file  Relationships  . Social Herbalist on phone: Not on file    Gets together: Not on file    Attends religious service: Not on file    Active member of club or organization: Not on file    Attends meetings of clubs or organizations: Not on file     Relationship status: Not on file  Other Topics Concern  . 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  1 son in [place], 1 son in [place]. 7 grandchildren, 2 great granddaughters (in [place]), one more on the way     Family History: The patient's family history includes Cancer in her brother; Heart attack (age of onset: 44) in her mother; Heart disease in an other family member; Heart disease (age of onset: 59) in her brother; Hypertension in her brother and mother. There is no history of Diabetes, Breast cancer, or Colon cancer.  ROS:   Please see the history of present illness.    No urine or stool.  All other systems reviewed and are negative.  EKGs/Labs/Other Studies Reviewed:  The following studies were reviewed today: No new data  EKG:  EKG has not been repeated since February 2020 with her tracing was relatively normal in appearance.  Recent Labs: No results found for requested labs within last 8760 hours.  Recent Lipid Panel    Component Value Date/Time   CHOL 161 12/20/2017 0819   TRIG 172 (H) 12/20/2017 0819   HDL 43 12/20/2017 0819   CHOLHDL 3.7 12/20/2017 0819   CHOLHDL 3.4 09/06/2016 1146   VLDL 28 09/06/2016 1146   LDLCALC 84 12/20/2017 0819    Physical Exam:    VS:  BP (!) 144/68   Pulse 72   Ht 5\' 5"  (1.651 m)   Wt 139 lb 6.4 oz (63.2 kg)   SpO2 95%   BMI 23.20 kg/m     Wt Readings from Last 3 Encounters:  12/16/18 139 lb 6.4 oz (63.2 kg)  09/24/18 136 lb (61.7 kg)  09/22/18 136 lb (61.7 kg)     GEN: . No acute distress HEENT: Normal NECK: No JVD. LYMPHATICS: No lymphadenopathy CARDIAC: RRR.  No murmur, no gallop, no edema VASCULAR: 2+ bilateral radial and dorsalis pedis pulses, no bruits RESPIRATORY:  Clear to auscultation without rales, wheezing or rhonchi  ABDOMEN: Soft, non-tender, non-distended, No pulsatile mass, MUSCULOSKELETAL: No deformity  SKIN: Warm and dry NEUROLOGIC:  Alert and oriented x 3 PSYCHIATRIC:   Normal affect   ASSESSMENT:    1. Coronary artery disease of bypass graft of native heart with stable angina pectoris ([place])   2. Chronic diastolic heart failure (HCC)   3. Pure hypercholesterolemia   4. Paroxysmal atrial fibrillation (HCC)   5. Essential hypertension, benign   6. Obstruction of carotid artery, unspecified laterality    PLAN:    In order of problems listed above:  1. She has progressive angina pectoris despite aggressive medical therapy.  Quality of life is significantly diminished due to the episodes of angina.  Both severity of discomfort and radiation of pain have increased.  Plan coronary angiography, bypass graft angiography, and possible PCI depending upon anatomy.  Pending collateral we will increase isosorbide mononitrate to 120 mg/day and continue high-dose atenolol. 2. Volume overload.   3. Unable to treat lipids due to statin intolerance and inability to go forward PCSK9 therapy. 4. No episodes of atrial fibrillation since surgery. 5. Blood pressure is back in reasonable range: 140/70 mmHg today.  The patient was counseled to undergo left heart catheterization, coronary angiography, and possible percutaneous coronary intervention with stent implantation. The procedural risks and benefits were discussed in detail. The risks discussed included death, stroke, myocardial infarction, life-threatening bleeding, limb ischemia, kidney injury, allergy, and possible emergency cardiac surgery. The risk of these significant complications were estimated to occur less than 1% of the time. After discussion, the patient has agreed to proceed.    Medication Adjustments/Labs and Tests Ordered: Current medicines are reviewed at length with the patient today.  Concerns regarding medicines are outlined above.  Orders Placed This Encounter  Procedures  . Basic metabolic panel  . CBC   Meds ordered this encounter  Medications  . isosorbide mononitrate (IMDUR) 120 MG 24 hr tablet     Sig: Take 1 tablet (120 mg total) by mouth daily.    Dispense:  90 tablet    Refill:  3    Dose change    Patient Instructions  Medication Instructions:  1) INCREASE Isosorbide to 120mg  once daily  If you need a refill on your cardiac  medications before your next appointment, please call your pharmacy.   Lab work: BMET and CBC today If you have labs (blood work) drawn today and your tests are completely normal, you will receive your results only by: Marland Kitchen MyChart Message (if you have MyChart) OR . A paper copy in the mail If you have any lab test that is abnormal or we need to change your treatment, we will call you to review the results.  Testing/Procedures: Your physician has requested that you have a cardiac catheterization. Cardiac catheterization is used to diagnose and/or treat various heart conditions. Doctors may recommend this procedure for a number of different reasons. The most common reason is to evaluate chest pain. Chest pain can be a symptom of coronary artery disease (CAD), and cardiac catheterization can show whether plaque is narrowing or blocking your heart's arteries. This procedure is also used to evaluate the valves, as well as measure the blood flow and oxygen levels in different parts of your heart. For further information please visit HugeFiesta.tn. Please follow instruction sheet, as given.   Follow-Up: At [facility], you and your health needs are our priority.  As part of our continuing mission to provide you with exceptional heart care, we have created designated Provider Care Teams.  These Care Teams include your primary Cardiologist (physician) and Advanced Practice Providers (APPs -  Physician Assistants and Nurse Practitioners) who all work together to provide you with the care you need, when you need it. You will need a follow up appointment in 2 weeks after cath.  Please call our office 2 months in advance to schedule this appointment.  You may see  Sinclair Grooms, MD or one of the following Advanced Practice Providers on your designated Care Team:   Truitt Merle, NP Cecilie Kicks, NP . Kathyrn Drown, NP  Any Other Special Instructions Will Be Listed Below (If Applicable).      [place] OFFICE [place], [place] [address] Dept: [phone] Loc: [phone]  Claudia Howell  12/16/2018  You are scheduled for a Cardiac Catheterization on Thursday, July 9 with Dr. Daneen Schick.  1. Please arrive at the [facility] (Main Entrance A) at [facility]: [address] [place], [place] [address] at 5:30 AM (This time is two hours before your procedure to ensure your preparation). Free valet parking service is available.   Special note: Every effort is made to have your procedure done on time. Please understand that emergencies sometimes delay scheduled procedures.  2. Diet: Do not eat solid foods after midnight.  The patient may have clear liquids until 5am upon the day of the procedure.  3. Labs: You will have labs drawn today.   4. Medication instructions in preparation for your procedure:  Do not take your Furosemide, Hydrochlorothiazide or Potassium the morning of your test.    Contrast Allergy: No   On the morning of your procedure, take your Aspirin and any morning medicines NOT listed above.  You may use sips of water.  5. Plan for one night stay--bring personal belongings. 6. Bring a current list of your medications and current insurance cards. 7. You MUST have a responsible person to drive you home. 8. Someone MUST be with you the first 24 hours after you arrive home or your discharge will be delayed. 9. Please wear clothes that are easy to get on and off and wear slip-on shoes.  Thank you for allowing [place] to care for you!   -- [facility]     Signed, Sinclair Grooms, MD   12/16/2018 3:21 PM    [place]

## 2018-12-15 NOTE — Telephone Encounter (Signed)

## 2018-12-16 ENCOUNTER — Encounter: Payer: Self-pay | Admitting: Interventional Cardiology

## 2018-12-16 ENCOUNTER — Other Ambulatory Visit: Payer: Self-pay

## 2018-12-16 ENCOUNTER — Ambulatory Visit (INDEPENDENT_AMBULATORY_CARE_PROVIDER_SITE_OTHER): Payer: Medicare Other | Admitting: Interventional Cardiology

## 2018-12-16 VITALS — BP 144/68 | HR 72 | Ht 65.0 in | Wt 139.4 lb

## 2018-12-16 DIAGNOSIS — I25708 Atherosclerosis of coronary artery bypass graft(s), unspecified, with other forms of angina pectoris: Secondary | ICD-10-CM

## 2018-12-16 DIAGNOSIS — E7800 Pure hypercholesterolemia, unspecified: Secondary | ICD-10-CM

## 2018-12-16 DIAGNOSIS — I10 Essential (primary) hypertension: Secondary | ICD-10-CM | POA: Diagnosis not present

## 2018-12-16 DIAGNOSIS — I6529 Occlusion and stenosis of unspecified carotid artery: Secondary | ICD-10-CM | POA: Diagnosis not present

## 2018-12-16 DIAGNOSIS — I5032 Chronic diastolic (congestive) heart failure: Secondary | ICD-10-CM

## 2018-12-16 DIAGNOSIS — I480 Paroxysmal atrial fibrillation: Secondary | ICD-10-CM | POA: Diagnosis not present

## 2018-12-16 DIAGNOSIS — I209 Angina pectoris, unspecified: Secondary | ICD-10-CM | POA: Diagnosis not present

## 2018-12-16 MED ORDER — ISOSORBIDE MONONITRATE ER 120 MG PO TB24: 120.0000 mg | ORAL_TABLET | Freq: Every day | ORAL | 3 refills | Status: DC

## 2018-12-16 NOTE — Patient Instructions (Addendum)
Medication Instructions:  1) INCREASE Isosorbide to 120mg  once daily  If you need a refill on your cardiac medications before your next appointment, please call your pharmacy.   Lab work: BMET and CBC today If you have labs (blood work) drawn today and your tests are completely normal, you will receive your results only by: Marland Kitchen MyChart Message (if you have MyChart) OR . A paper copy in the mail If you have any lab test that is abnormal or we need to change your treatment, we will call you to review the results.  Testing/Procedures: Your physician has requested that you have a cardiac catheterization. Cardiac catheterization is used to diagnose and/or treat various heart conditions. Doctors may recommend this procedure for a number of different reasons. The most common reason is to evaluate chest pain. Chest pain can be a symptom of coronary artery disease (CAD), and cardiac catheterization can show whether plaque is narrowing or blocking your heart's arteries. This procedure is also used to evaluate the valves, as well as measure the blood flow and oxygen levels in different parts of your heart. For further information please visit HugeFiesta.tn. Please follow instruction sheet, as given.   Follow-Up: At [facility], you and your health needs are our priority.  As part of our continuing mission to provide you with exceptional heart care, we have created designated Provider Care Teams.  These Care Teams include your primary Cardiologist (physician) and Advanced Practice Providers (APPs -  Physician Assistants and Nurse Practitioners) who all work together to provide you with the care you need, when you need it. You will need a follow up appointment in 2 weeks after cath.  Please call our office 2 months in advance to schedule this appointment.  You may see Sinclair Grooms, MD or one of the following Advanced Practice Providers on your designated Care Team:   Truitt Merle, NP Cecilie Kicks, NP . Kathyrn Drown, NP  Any Other Special Instructions Will Be Listed Below (If Applicable).      [place] OFFICE [place], [place] [address] Dept: [phone] Loc: [phone]  Claudia Howell  12/16/2018  You are scheduled for a Cardiac Catheterization on Thursday, July 9 with Dr. Daneen Schick.  1. Please arrive at the [facility] (Main Entrance A) at [facility]: [address] [place], [place] [address] at 5:30 AM (This time is two hours before your procedure to ensure your preparation). Free valet parking service is available.   Special note: Every effort is made to have your procedure done on time. Please understand that emergencies sometimes delay scheduled procedures.  2. Diet: Do not eat solid foods after midnight.  The patient may have clear liquids until 5am upon the day of the procedure.  3. Labs: You will have labs drawn today.   4. Medication instructions in preparation for your procedure:  Do not take your Furosemide, Hydrochlorothiazide or Potassium the morning of your test.    Contrast Allergy: No   On the morning of your procedure, take your Aspirin and any morning medicines NOT listed above.  You may use sips of water.  5. Plan for one night stay--bring personal belongings. 6. Bring a current list of your medications and current insurance cards. 7. You MUST have a responsible person to drive you home. 8. Someone MUST be with you the first 24 hours after you arrive home or your discharge  will be delayed. 9. Please wear clothes that are easy to get on and off and wear slip-on shoes.  Thank you for allowing [place] to care for you!   -- [facility]

## 2018-12-17 LAB — CBC
Hematocrit: 35.7 % (ref 34.0–46.6)
Hemoglobin: 12.1 g/dL (ref 11.1–15.9)
MCH: 30.7 pg (ref 26.6–33.0)
MCHC: 33.9 g/dL (ref 31.5–35.7)
MCV: 91 fL (ref 79–97)
Platelets: 232 10*3/uL (ref 150–450)
RBC: 3.94 x10E6/uL (ref 3.77–5.28)
RDW: 12.1 % (ref 11.7–15.4)
WBC: 7.6 10*3/uL (ref 3.4–10.8)

## 2018-12-17 LAB — BASIC METABOLIC PANEL
BUN/Creatinine Ratio: 19 (ref 12–28)
BUN: 15 mg/dL (ref 8–27)
CO2: 26 mmol/L (ref 20–29)
Calcium: 9.6 mg/dL (ref 8.7–10.3)
Chloride: 95 mmol/L — ABNORMAL LOW (ref 96–106)
Creatinine, Ser: 0.81 mg/dL (ref 0.57–1.00)
GFR calc Af Amer: 82 mL/min/{1.73_m2} (ref >59)
GFR calc non Af Amer: 71 mL/min/{1.73_m2} (ref >59)
Glucose: 104 mg/dL — ABNORMAL HIGH (ref 65–99)
Potassium: 3.8 mmol/L (ref 3.5–5.2)
Sodium: 136 mmol/L (ref 134–144)

## 2018-12-29 ENCOUNTER — Other Ambulatory Visit (HOSPITAL_COMMUNITY)
Admission: RE | Admit: 2018-12-29 | Discharge: 2018-12-29 | Disposition: A | Discharge status: Home / Self Care | Payer: Medicare Other | Source: Ambulatory Visit | Attending: Interventional Cardiology | Admitting: Interventional Cardiology

## 2018-12-29 DIAGNOSIS — Z1159 Encounter for screening for other viral diseases: Secondary | ICD-10-CM | POA: Diagnosis not present

## 2018-12-29 DIAGNOSIS — Z01812 Encounter for preprocedural laboratory examination: Secondary | ICD-10-CM | POA: Insufficient documentation

## 2018-12-29 LAB — SARS CORONAVIRUS 2 (TAT 6-24 HRS): SARS Coronavirus 2: NEGATIVE

## 2018-12-31 ENCOUNTER — Ambulatory Visit: Payer: Medicare Other | Admitting: Interventional Cardiology

## 2018-12-31 ENCOUNTER — Telehealth: Payer: Self-pay

## 2018-12-31 NOTE — Telephone Encounter (Signed)
Reviewed the following pre-procedure instructions with the patient. The patient verbalized understanding and has no questions at this time.  Pt contacted pre-catheterization scheduled at [facility] for: 7:30 am on 01/01/19 Verified arrival time and place: [facility] Main Entrance A at:5:30 am  Covid-19 test date:12/29/18  No solid food after midnight prior to cath, clear liquids until 5 AM day of procedure. Contrast allergy: No Verified no diabetes medications. None  AM meds can be  taken pre-cath with sip of water including: ASA 81 mg  Patient is instructed to hold her lasix the morning of the procedure.  Confirmed patient has responsible person to drive home post procedure and observe 24 hours after arriving home: Patient has a responsible person to drive them home after the procedure and observe them for 24 hours after arriving home.      COVID-19 Pre-Screening Questions:   In the past 7 to 10 days have you had a cough, shortness of breath, headache, congestion, fever (100 or greater) body aches, chills, sore throat, or sudden loss of taste or sense of smell? No  Have you been around anyone with known Covid 19. No  Have you been around anyone who is awaiting Covid 19 test results in the past 7 to 10 days? No  Have you been around anyone who has been exposed to Covid 19, or has mentioned symptoms of Covid 19 within the past 7 to 10 days? No

## 2018-12-31 NOTE — Telephone Encounter (Signed)
Attempted to call patient to review the following instructions with her, she did not answer the phone so I left a message for her to call back. Will try to call her again later today.  Pt contacted pre-catheterization scheduled at [facility] for: 7:30 am on 01/01/19 Verified arrival time and place: [facility] Main Entrance A at:5:30 am  Covid-19 test date:12/29/18  No solid food after midnight prior to cath, clear liquids until 5 AM day of procedure. Contrast allergy: No Verified no diabetes medications. None  AM meds can be  taken pre-cath with sip of water including: ASA 81 mg   Confirmed patient has responsible person to drive home post procedure and observe 24 hours after arriving home: Patient has a responsible person to drive them home after the procedure and observe them for 24 hours after arriving home.      COVID-19 Pre-Screening Questions:  . In the past 7 to 10 days have you had a cough,  shortness of breath, headache, congestion, fever (100 or greater) body aches, chills, sore throat, or sudden loss of taste or sense of smell? No . Have you been around anyone with known Covid 19. No . Have you been around anyone who is awaiting Covid 19 test results in the past 7 to 10 days? No . Have you been around anyone who has been exposed to Covid 19, or has mentioned symptoms of Covid 19 within the past 7 to 10 days? No  If you have any concerns/questions about symptoms patients report during screening (either on the phone or at threshold). Contact the provider seeing the patient or DOD for further guidance.  If neither are available contact a member of the leadership team.

## 2019-01-01 ENCOUNTER — Ambulatory Visit (HOSPITAL_COMMUNITY)
Admission: RE | Admit: 2019-01-01 | Discharge: 2019-01-01 | Disposition: A | Discharge status: Home / Self Care | Payer: Medicare Other | Attending: Interventional Cardiology | Admitting: Interventional Cardiology

## 2019-01-01 ENCOUNTER — Encounter (HOSPITAL_COMMUNITY): Payer: Self-pay | Admitting: Interventional Cardiology

## 2019-01-01 ENCOUNTER — Other Ambulatory Visit: Payer: Self-pay

## 2019-01-01 ENCOUNTER — Encounter (HOSPITAL_COMMUNITY)
Admission: RE | Disposition: A | Discharge status: Home / Self Care | Payer: Self-pay | Source: Home / Self Care | Attending: Interventional Cardiology

## 2019-01-01 DIAGNOSIS — Z885 Allergy status to narcotic agent status: Secondary | ICD-10-CM | POA: Diagnosis not present

## 2019-01-01 DIAGNOSIS — I25119 Atherosclerotic heart disease of native coronary artery with unspecified angina pectoris: Secondary | ICD-10-CM

## 2019-01-01 DIAGNOSIS — I25719 Atherosclerosis of autologous vein coronary artery bypass graft(s) with unspecified angina pectoris: Secondary | ICD-10-CM | POA: Diagnosis not present

## 2019-01-01 DIAGNOSIS — Z951 Presence of aortocoronary bypass graft: Secondary | ICD-10-CM | POA: Diagnosis not present

## 2019-01-01 DIAGNOSIS — Z90722 Acquired absence of ovaries, bilateral: Secondary | ICD-10-CM | POA: Diagnosis not present

## 2019-01-01 DIAGNOSIS — I480 Paroxysmal atrial fibrillation: Secondary | ICD-10-CM | POA: Diagnosis not present

## 2019-01-01 DIAGNOSIS — Z7982 Long term (current) use of aspirin: Secondary | ICD-10-CM | POA: Diagnosis not present

## 2019-01-01 DIAGNOSIS — M8580 Other specified disorders of bone density and structure, unspecified site: Secondary | ICD-10-CM | POA: Diagnosis not present

## 2019-01-01 DIAGNOSIS — Z90710 Acquired absence of both cervix and uterus: Secondary | ICD-10-CM | POA: Insufficient documentation

## 2019-01-01 DIAGNOSIS — Z8249 Family history of ischemic heart disease and other diseases of the circulatory system: Secondary | ICD-10-CM | POA: Diagnosis not present

## 2019-01-01 DIAGNOSIS — Z955 Presence of coronary angioplasty implant and graft: Secondary | ICD-10-CM | POA: Diagnosis not present

## 2019-01-01 DIAGNOSIS — I25810 Atherosclerosis of coronary artery bypass graft(s) without angina pectoris: Secondary | ICD-10-CM | POA: Diagnosis present

## 2019-01-01 DIAGNOSIS — I209 Angina pectoris, unspecified: Secondary | ICD-10-CM | POA: Diagnosis present

## 2019-01-01 DIAGNOSIS — Z79899 Other long term (current) drug therapy: Secondary | ICD-10-CM | POA: Diagnosis not present

## 2019-01-01 DIAGNOSIS — I5042 Chronic combined systolic (congestive) and diastolic (congestive) heart failure: Secondary | ICD-10-CM | POA: Diagnosis not present

## 2019-01-01 DIAGNOSIS — Z8679 Personal history of other diseases of the circulatory system: Secondary | ICD-10-CM

## 2019-01-01 DIAGNOSIS — E7800 Pure hypercholesterolemia, unspecified: Secondary | ICD-10-CM | POA: Diagnosis not present

## 2019-01-01 DIAGNOSIS — I2582 Chronic total occlusion of coronary artery: Secondary | ICD-10-CM | POA: Diagnosis not present

## 2019-01-01 DIAGNOSIS — I110 Hypertensive heart disease with heart failure: Secondary | ICD-10-CM | POA: Insufficient documentation

## 2019-01-01 HISTORY — PX: LEFT HEART CATH AND CORS/GRAFTS ANGIOGRAPHY: CATH118250

## 2019-01-01 HISTORY — PX: CORONARY STENT INTERVENTION: CATH118234

## 2019-01-01 LAB — POCT ACTIVATED CLOTTING TIME: Activated Clotting Time: 324 seconds

## 2019-01-01 SURGERY — LEFT HEART CATH AND CORS/GRAFTS ANGIOGRAPHY
Anesthesia: LOCAL

## 2019-01-01 MED ORDER — SODIUM CHLORIDE 0.9 % WEIGHT BASED INFUSION
3.0000 mL/kg/h | INTRAVENOUS | Status: AC
  Administered 2019-01-01: 3 mL/kg/h via INTRAVENOUS

## 2019-01-01 MED ORDER — CLOPIDOGREL BISULFATE 300 MG PO TABS
ORAL_TABLET | ORAL | Status: DC | PRN
  Administered 2019-01-01: 600 mg via ORAL

## 2019-01-01 MED ORDER — CLOPIDOGREL BISULFATE 300 MG PO TABS
ORAL_TABLET | ORAL | Status: AC
  Filled 2019-01-01: qty 2

## 2019-01-01 MED ORDER — HEPARIN (PORCINE) IN NACL 1000-0.9 UT/500ML-% IV SOLN
INTRAVENOUS | Status: DC | PRN
  Administered 2019-01-01 (×2): 500 mL

## 2019-01-01 MED ORDER — FENTANYL CITRATE (PF) 100 MCG/2ML IJ SOLN
INTRAMUSCULAR | Status: DC | PRN
  Administered 2019-01-01: 25 ug via INTRAVENOUS
  Administered 2019-01-01: 25 ug
  Administered 2019-01-01 (×2): 25 ug via INTRAVENOUS

## 2019-01-01 MED ORDER — NITROGLYCERIN 1 MG/10 ML FOR IR/CATH LAB
INTRA_ARTERIAL | Status: AC
  Filled 2019-01-01: qty 10

## 2019-01-01 MED ORDER — SODIUM CHLORIDE 0.9 % IV SOLN
INTRAVENOUS | Status: DC | PRN
  Administered 2019-01-01: 1.75 mg/kg/h via INTRAVENOUS

## 2019-01-01 MED ORDER — BIVALIRUDIN BOLUS VIA INFUSION - CUPID
INTRAVENOUS | Status: DC | PRN
  Administered 2019-01-01: 45.9 mg via INTRAVENOUS

## 2019-01-01 MED ORDER — IOHEXOL 350 MG/ML SOLN
INTRAVENOUS | Status: DC | PRN
  Administered 2019-01-01: 220 mL via INTRA_ARTERIAL

## 2019-01-01 MED ORDER — SODIUM CHLORIDE 0.9% FLUSH: 3.0000 mL | Freq: Two times a day (BID) | INTRAVENOUS | Status: DC

## 2019-01-01 MED ORDER — MIDAZOLAM HCL 2 MG/2ML IJ SOLN
INTRAMUSCULAR | Status: DC | PRN
  Administered 2019-01-01: 0.5 mg
  Administered 2019-01-01: 1 mg via INTRAVENOUS
  Administered 2019-01-01 (×2): 0.5 mg via INTRAVENOUS

## 2019-01-01 MED ORDER — HYDRALAZINE HCL 20 MG/ML IJ SOLN
INTRAMUSCULAR | Status: DC | PRN
  Administered 2019-01-01: 10 mg via INTRAVENOUS

## 2019-01-01 MED ORDER — LABETALOL HCL 5 MG/ML IV SOLN: 10.0000 mg | INTRAVENOUS | Status: DC | PRN

## 2019-01-01 MED ORDER — HYDRALAZINE HCL 20 MG/ML IJ SOLN: 10.0000 mg | INTRAMUSCULAR | Status: DC | PRN

## 2019-01-01 MED ORDER — NITROGLYCERIN 1 MG/10 ML FOR IR/CATH LAB
INTRA_ARTERIAL | Status: DC | PRN
  Administered 2019-01-01 (×2): 200 ug via INTRACORONARY

## 2019-01-01 MED ORDER — CLOPIDOGREL BISULFATE 75 MG PO TABS: 75.0000 mg | ORAL_TABLET | Freq: Every day | ORAL | 11 refills | Status: DC

## 2019-01-01 MED ORDER — LIDOCAINE HCL (PF) 1 % IJ SOLN
INTRAMUSCULAR | Status: DC | PRN
  Administered 2019-01-01: 18 mL

## 2019-01-01 MED ORDER — SODIUM CHLORIDE 0.9 % IV SOLN: 250.0000 mL | INTRAVENOUS | Status: DC | PRN

## 2019-01-01 MED ORDER — CLOPIDOGREL BISULFATE 75 MG PO TABS: 75.0000 mg | ORAL_TABLET | Freq: Every day | ORAL | Status: DC

## 2019-01-01 MED ORDER — LIDOCAINE HCL (PF) 1 % IJ SOLN
INTRAMUSCULAR | Status: AC
  Filled 2019-01-01: qty 30

## 2019-01-01 MED ORDER — BIVALIRUDIN TRIFLUOROACETATE 250 MG IV SOLR
INTRAVENOUS | Status: AC
  Filled 2019-01-01: qty 250

## 2019-01-01 MED ORDER — SODIUM CHLORIDE 0.9 % WEIGHT BASED INFUSION: 1.0000 mL/kg/h | INTRAVENOUS | Status: DC

## 2019-01-01 MED ORDER — ASPIRIN 81 MG PO CHEW: 81.0000 mg | CHEWABLE_TABLET | Freq: Every day | ORAL | Status: DC

## 2019-01-01 MED ORDER — HEPARIN (PORCINE) IN NACL 1000-0.9 UT/500ML-% IV SOLN
INTRAVENOUS | Status: AC
  Filled 2019-01-01: qty 1000

## 2019-01-01 MED ORDER — HYDRALAZINE HCL 20 MG/ML IJ SOLN
INTRAMUSCULAR | Status: AC
  Filled 2019-01-01: qty 1

## 2019-01-01 MED ORDER — MIDAZOLAM HCL 2 MG/2ML IJ SOLN: INTRAMUSCULAR | Status: DC | PRN

## 2019-01-01 MED ORDER — FAMOTIDINE IN NACL 20-0.9 MG/50ML-% IV SOLN
INTRAVENOUS | Status: AC | PRN
  Administered 2019-01-01: 20 mg via INTRAVENOUS

## 2019-01-01 MED ORDER — SODIUM CHLORIDE 0.9 % IV SOLN: INTRAVENOUS | Status: AC

## 2019-01-01 MED ORDER — SODIUM CHLORIDE 0.9% FLUSH: 3.0000 mL | INTRAVENOUS | Status: DC | PRN

## 2019-01-01 MED ORDER — MIDAZOLAM HCL 2 MG/2ML IJ SOLN
INTRAMUSCULAR | Status: AC
  Filled 2019-01-01: qty 2

## 2019-01-01 MED ORDER — CLOPIDOGREL BISULFATE 75 MG PO TABS: 75.0000 mg | ORAL_TABLET | Freq: Every day | ORAL | 3 refills | Status: DC

## 2019-01-01 MED ORDER — ONDANSETRON HCL 4 MG/2ML IJ SOLN: 4.0000 mg | Freq: Four times a day (QID) | INTRAMUSCULAR | Status: DC | PRN

## 2019-01-01 MED ORDER — ACETAMINOPHEN 325 MG PO TABS: 650.0000 mg | ORAL_TABLET | ORAL | Status: DC | PRN

## 2019-01-01 MED ORDER — FENTANYL CITRATE (PF) 100 MCG/2ML IJ SOLN
INTRAMUSCULAR | Status: AC
  Filled 2019-01-01: qty 2

## 2019-01-01 MED ORDER — FENTANYL CITRATE (PF) 100 MCG/2ML IJ SOLN: INTRAMUSCULAR | Status: DC | PRN

## 2019-01-01 MED ORDER — ASPIRIN 81 MG PO CHEW: 81.0000 mg | CHEWABLE_TABLET | ORAL | Status: DC

## 2019-01-01 MED FILL — CLOPIDOGREL 75 MG TABLET: 75 | 30 days supply | Qty: 30 | Fill #0 | Status: TO

## 2019-01-01 MED FILL — CLOPIDOGREL 75 MG TABLET: 75 | 30 days supply | Qty: 30 | Fill #0

## 2019-01-01 SURGICAL SUPPLY — 22 items
BALLN EMERGE MR 2.25X12 (BALLOONS) ×2
BALLN TREK OTW 2.25X12 (BALLOONS) ×2
BALLOON EMERGE MR 2.25X12 (BALLOONS) IMPLANT
BALLOON TREK OTW 2.25X12 (BALLOONS) IMPLANT
CATH INFINITI 5 FR IM (CATHETERS) ×1 IMPLANT
CATH INFINITI 5FR MPB2 (CATHETERS) ×1 IMPLANT
CATH LAUNCHER 6FR IMA (CATHETERS) ×1 IMPLANT
CATH LAUNCHER 6FR IMA SH (CATHETERS) ×1 IMPLANT
CATH LAUNCHER 6FR JR4 (CATHETERS) ×1 IMPLANT
CLOSURE MYNX CONTROL 6F/7F (Vascular Products) ×1 IMPLANT
COVER DOME SNAP 22 D (MISCELLANEOUS) ×1 IMPLANT
GUIDEWIRE ANGLED .035X150CM (WIRE) ×1 IMPLANT
KIT HEART LEFT (KITS) ×2 IMPLANT
PACK CARDIAC CATHETERIZATION (CUSTOM PROCEDURE TRAY) ×2 IMPLANT
SHEATH PINNACLE 5F 10CM (SHEATH) ×1 IMPLANT
SHEATH PINNACLE 6F 10CM (SHEATH) ×1 IMPLANT
SHEATH PROBE COVER 6X72 (BAG) ×1 IMPLANT
TRANSDUCER W/STOPCOCK (MISCELLANEOUS) ×2 IMPLANT
TUBING CIL FLEX 10 FLL-RA (TUBING) ×2 IMPLANT
WIRE ASAHI FIELDER XT 190CM (WIRE) ×1 IMPLANT
WIRE ASAHI PROWATER 180CM (WIRE) ×1 IMPLANT
WIRE EMERALD 3MM-J .035X150CM (WIRE) ×1 IMPLANT

## 2019-01-01 NOTE — Progress Notes (Signed)
CARDIAC REHAB PHASE I   Completed brief education with pt. Pt educated on importance of ASA and Plavix. Pt knowledgeable of NTG, and of heart disease. Pt given heart healthy diet. Reviewed restrictions and site care. Encouraged pt to ambulate as able but to pay attention to symptoms. Hopeful to return to open up blockage. Will defer referring to CRP II at this time.   [place], RN BSN 01/01/2019 1:39 PM

## 2019-01-01 NOTE — Discharge Instructions (Signed)

## 2019-01-01 NOTE — CV Procedure (Signed)
   Left heart cath, coronary angiography, and bypass graft angiography.  Left ventriculography with hemodynamic recordings.  Right femoral access using real-time ultrasound guidance.  Patent LIMA to LAD, patent saphenous vein graft to the obtuse marginal, totally occluded saphenous vein graft to the RCA.  Native coronary demonstrates total occlusion of the proximal circumflex, 60% ostial, 80% proximal, 99% mid RCA tandem stenoses and diffuse 50% mid LAD before second diagonal followed by 90% stenosis of the mid LAD beyond diagonal #2.  Attempted PCI on RCA was unsuccessful due to inability to steer the wire across the mid stenosis which was eccentric, and spine did as a chronic total occlusion.  Wire escalation was attempted from a Child psychotherapist to a [place] using balloon catheter support without success.  After 30 minutes plus of fluoroscopy time and greater than 200 cc of contrast, the case was terminated.  Mynx was used for hemostasis with success.

## 2019-01-01 NOTE — Interval H&P Note (Signed)
Cath Lab Visit (complete for each Cath Lab visit)  Clinical Evaluation Leading to the Procedure:   ACS: Yes.    Non-ACS:    Anginal Classification: CCS III  Anti-ischemic medical therapy: Maximal Therapy (2 or more classes of medications)  Non-Invasive Test Results: No non-invasive testing performed  Prior CABG: No previous CABG      History and Physical Interval Note:  01/01/2019 7:19 AM  Claudia Howell  has presented today for surgery, with the diagnosis of angina.  The various methods of treatment have been discussed with the patient and family. After consideration of risks, benefits and other options for treatment, the patient has consented to  Procedure(s): LEFT HEART CATH AND CORS/GRAFTS ANGIOGRAPHY (N/A) as a surgical intervention.  The patient's history has been reviewed, patient examined, no change in status, stable for surgery.  I have reviewed the patient's chart and labs.  Questions were answered to the patient's satisfaction.     Belva Crome III

## 2019-01-02 ENCOUNTER — Telehealth: Payer: Self-pay | Admitting: Interventional Cardiology

## 2019-01-02 NOTE — Telephone Encounter (Signed)
Spoke with pt and she said since yesterday she has been itching terribly.  Denies a rash or hives.  Dr. Tamala Julian advised for Prednisone 5mg  taper pack. Spoke with [place], [facility] at [facility] and gave order.  Spoke with pt and made her aware.  Pt appreciative for call.

## 2019-01-02 NOTE — Progress Notes (Signed)
Patient called Procedural Short Stay on 01/02/19 and stated that she was itchy all over.  Patient has been putting cream on her skin without relief.  Has not tried any oral medications.  Instructed patient to call Dr. Tamala Julian office.  RN called Dr. Tamala Julian office to make him aware.  Unable to reach Dr. Tamala Julian but secretary took down information and patient phone number to give to Dr. Tamala Julian.

## 2019-01-02 NOTE — Telephone Encounter (Signed)
New Message          Patient is having a allegric reaction to the contrast. Patient states she is itching extremely bad and nothing is helping. Pls advise.

## 2019-01-16 ENCOUNTER — Telehealth: Payer: Self-pay | Admitting: *Deleted

## 2019-01-16 ENCOUNTER — Telehealth: Payer: Self-pay | Admitting: Interventional Cardiology

## 2019-01-16 NOTE — Telephone Encounter (Signed)
Spoke with pt and  B/P this am was 187/84 before meds. Per pt has felt slight dizziness with changing positions later in the day  B/P ranging 112-116/66-68 Has concerns with being to low so pt yesterday consumed extra salt to increase value . Encouraged pt to check B/P this evening and see what value is and also to watch salt consumption due to CHF and retaining fluid could get into trouble with SOB .  Informed pt these readings are good Will forward Dr Tamala Julian for review .Adonis Housekeeper

## 2019-01-16 NOTE — Telephone Encounter (Signed)
New Message            Pt c/o BP issue: STAT if pt c/o blurred vision, one-sided weakness or slurred speech  1. What are your last 5 BP readings? 115/58 112/58  116/56 (187/84 before medication) This AM  2. Are you having any other symptoms (ex. Dizziness, headache, blurred vision, passed out)?  Light headed  3. What is your BP issue? Dropping drastically

## 2019-01-16 NOTE — Telephone Encounter (Signed)
Pt prescreened for appt 01/19/2019 and has answered no to the following covid19 prescreen questions.         COVID-19 Pre-Screening Questions:  . In the past 7 to 10 days have you had a cough,  shortness of breath, headache, congestion, fever (100 or greater) body aches, chills, sore throat, or sudden loss of taste or sense of smell? . Have you been around anyone with known Covid 19. . Have you been around anyone who is awaiting Covid 19 test results in the past 7 to 10 days? . Have you been around anyone who has been exposed to Covid 19, or has mentioned symptoms of Covid 19 within the past 7 to 10 days?  If you have any concerns/questions about symptoms patients report during screening (either on the phone or at threshold). Contact the provider seeing the patient or DOD for further guidance.  If neither are available contact a member of the leadership team.

## 2019-01-17 NOTE — Progress Notes (Signed)
Cardiology Office Note:    Date:  01/19/2019   ID:  Claudia Howell, DOB 1940-10-17, MRN [id number]  PCP:  Rita Ohara, MD  Cardiologist:  Sinclair Grooms, MD   Referring MD: Rita Ohara, MD   Chief Complaint  Patient presents with  . Coronary Artery Disease    History of Present Illness:    Claudia Howell is a 78 y.o. female with a hx of coronary artery disease with bypass graft(January/2014), saphenous vein graft stent(10/2012), hypertension, acute on chronic systolic and diastolic heart failure, history of paroxysmal atrial fibrillation,Maze procedure doing coronary bypass grafting, and essential hypertension.  The patient has not increased isosorbide to 120 mg as recommended.  She has taken no more than 90 mg.  She began taking isosorbide 30 mg 3 times per day.  States that she feels somewhat lightheaded and weak taking the medication at this dose.  She reveals that the increase in isosorbide mononitrate from 30 to 60 mg/day dramatically improved angina that she was having.  Since the medication change was made prior to coronary angiography, she has had much less and essentially no angina.  She has been able to be active at home.  Discussed with the patient and her daughter that we noted collaterals to the right coronary during the angiography.  The goal with increasing isosorbide was to help improve collateral blood flow.  I have spoken to Dr. Martinique and Dr. Irish Lack who feel that CTO-like therapy of the right coronary could be accomplished if needed.  I discussed this with the patient who currently feels that medical therapy is working and that she would prefer this more conservative approach for the time being.  Past Medical History:  Diagnosis Date  . Adenomatous colon polyp 10/09  . Coronary artery disease    Last cath 12/14 Cath 12/8 100% SVG occlusion to RCA, 95% SVG.  The stenosis to OM.  LIMA to LAD patent but diffuse disease in LAD after graft.  Overlapping stents  mid/distal SVG to OM.  Marland Kitchen Decubitus ulcer of coccyx 10/22/12   1/2 inch raw open area on coccyx  . Hyperlipidemia   . Hypertension    Dr.Cahterine Heinzel Tamala Julian  . Osteopenia 9/09  . PAF (paroxysmal atrial fibrillation) ([place])    during cath/notes 10/16/2012  . S/P CABG x 3 10/22/2012   LIMA to LAD, SVG to distal RCA, SVG to OM, EVH from right thigh  . S/P Maze operation for atrial fibrillation 10/22/2012   Left side lesion set using bipolar radiofrequency and cryothermy ablation with clipping of LA appendage    Past Surgical History:  Procedure Laterality Date  . BREAST CYST EXCISION Left 1980-1990's   x 3  . CARDIAC CATHETERIZATION  10/16/2012  . CATARACT EXTRACTION, BILATERAL Bilateral    03/2015 and 05/2015  . CATARACT EXTRACTION, BILATERAL Bilateral approx 2016  . CORONARY ANGIOPLASTY WITH STENT PLACEMENT  04/01/2013   "1" (06/01/2013)  . CORONARY ARTERY BYPASS GRAFT N/A 10/22/2012   Procedure: CORONARY ARTERY BYPASS GRAFTING (CABG);  Surgeon: Rexene Alberts, MD;  Location: [place];  Service: Open Heart Surgery;  Laterality: N/A;  . CORONARY STENT INTERVENTION N/A 01/01/2019   Procedure: CORONARY STENT INTERVENTION;  Surgeon: Belva Crome, MD;  Location: [place] [facility];  Service: Cardiovascular;  Laterality: N/A;  . DILATION AND CURETTAGE OF UTERUS    . INTRAOPERATIVE TRANSESOPHAGEAL ECHOCARDIOGRAM N/A 10/22/2012   Procedure: INTRAOPERATIVE TRANSESOPHAGEAL ECHOCARDIOGRAM;  Surgeon: Rexene Alberts, MD;  Location: [place];  Service: Open Heart Surgery;  Laterality: N/A;  . LEFT HEART CATH AND CORS/GRAFTS ANGIOGRAPHY N/A 01/01/2019   Procedure: LEFT HEART CATH AND CORS/GRAFTS ANGIOGRAPHY;  Surgeon: Belva Crome, MD;  Location: [place] [facility];  Service: Cardiovascular;  Laterality: N/A;  . LEFT HEART CATHETERIZATION WITH CORONARY ANGIOGRAM N/A 10/16/2012   Procedure: LEFT HEART CATHETERIZATION WITH CORONARY ANGIOGRAM;  Surgeon: Sinclair Grooms, MD;  Location: [facility] CATH LAB;  Service:  Cardiovascular;  Laterality: N/A;  . LEFT HEART CATHETERIZATION WITH CORONARY/GRAFT ANGIOGRAM N/A 06/01/2013   Procedure: LEFT HEART CATHETERIZATION WITH Beatrix Fetters;  Surgeon: Sinclair Grooms, MD;  Location: [facility] CATH LAB;  Service: Cardiovascular;  Laterality: N/A;  . MAZE N/A 10/22/2012   Procedure: MAZE;  Surgeon: Rexene Alberts, MD;  Location: [place];  Service: Open Heart Surgery;  Laterality: N/A;  . PERCUTANEOUS CORONARY STENT INTERVENTION (PCI-S)  06/01/2013   Procedure: PERCUTANEOUS CORONARY STENT INTERVENTION (PCI-S);  Surgeon: Sinclair Grooms, MD;  Location: [facility] CATH LAB;  Service: Cardiovascular;;  . TONSILLECTOMY  1949  . TOTAL ABDOMINAL HYSTERECTOMY W/ BILATERAL SALPINGOOPHORECTOMY  1990   benign growth    Current Medications: Current Meds  Medication Sig  . aspirin EC 81 MG tablet Take 1 tablet (81 mg total) by mouth daily.  Marland Kitchen atenolol (TENORMIN) 100 MG tablet TAKE 1 TABLET(100 MG) BY MOUTH DAILY  . atorvastatin (LIPITOR) 10 MG tablet TAKE 1 TABLET BY MOUTH DAILY  . benazepril (LOTENSIN) 40 MG tablet TAKE 1 TABLET(40 MG) BY MOUTH DAILY  . cholecalciferol (VITAMIN D) 1000 UNITS tablet Take 1,000 Units by mouth daily.  . clopidogrel (PLAVIX) 75 MG tablet Take 1 tablet (75 mg total) by mouth daily.  . furosemide (LASIX) 20 MG tablet Take 1 tablet (20 mg total) by mouth as needed for fluid or edema. Reported on 12/19/2015  . GLUCOSAMINE-CHONDROITIN-VIT D3 PO Take 1 tablet by mouth daily.   . hydrochlorothiazide (HYDRODIURIL) 25 MG tablet TAKE 1 TABLET(25 MG) BY MOUTH DAILY  . Multiple Vitamin (MULTIVITAMIN) tablet Take 1 tablet by mouth daily.  . nitroGLYCERIN (NITROSTAT) 0.4 MG SL tablet Place 1 tablet (0.4 mg total) under the tongue every 5 (five) minutes as needed for chest pain.  . potassium chloride (K-DUR) 10 MEQ tablet Take 10 mEq by mouth every 3 (three) days.   . [DISCONTINUED] isosorbide mononitrate (IMDUR) 120 MG 24 hr tablet Take 1 tablet (120 mg total) by  mouth daily.     Allergies:   Zetia [ezetimibe] and Oxycodone   Social History   Socioeconomic History  . Marital status: Widowed    Spouse name: Not on file  . Number of children: 2  . Years of education: Not on file  . Highest education level: Not on file  Occupational History  . Occupation: retired    Comment: LPN  Social Needs  . Financial resource strain: Not on file  . Food insecurity    Worry: Not on file    Inability: Not on file  . Transportation needs    Medical: Not on file    Non-medical: Not on file  Tobacco Use  . Smoking status: Never Smoker  . Smokeless tobacco: Never Used  Substance and Sexual Activity  . Alcohol use: Yes    Alcohol/week: 1.0 standard drinks    Types: 1 Glasses of wine per week    Comment: 1 glass of wine 1 times/week  . Drug use: No  . Sexual activity: Not Currently  Lifestyle  .  Physical activity    Days per week: Not on file    Minutes per session: Not on file  . Stress: Not on file  Relationships  . Social Herbalist on phone: Not on file    Gets together: Not on file    Attends religious service: Not on file    Active member of club or organization: Not on file    Attends meetings of clubs or organizations: Not on file    Relationship status: Not on file  Other Topics Concern  . 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  1 son in [place], 1 son in [place]. 7 grandchildren, 2 great granddaughters (in [place]), one more on the way     Family History: The patient's family history includes Cancer in her brother; Heart attack (age of onset: 36) in her mother; Heart disease in an other family member; Heart disease (age of onset: 75) in her brother; Hypertension in her brother and mother. There is no history of Diabetes, Breast cancer, or Colon cancer.  ROS:   Please see the history of present illness.    Seems to be somewhat confused about her medications.  Daughter is concerned that her mom  complains of weakness.  Mrs. Hoffer feels that when she is weak her blood pressure is low as she was taking 30 mg of Imdur 3 times daily.  All other systems reviewed and are negative.  EKGs/Labs/Other Studies Reviewed:    The following studies were reviewed today:  CATH / FAILED PCI  NATIVE RCA: Intervention     Failed PCI RCA native vessel despite wire escalation to [facility] with wire support. Acted more like CTO. Discussed with CTO team Jordan/Varanasi who indicate a willingness to treat using CTO technique if needed.   EKG:  EKG is not repeated  Recent Labs: 12/16/2018: BUN 15; Creatinine, Ser 0.81; Hemoglobin 12.1; Platelets 232; Potassium 3.8; Sodium 136  Recent Lipid Panel    Component Value Date/Time   CHOL 161 12/20/2017 0819   TRIG 172 (H) 12/20/2017 0819   HDL 43 12/20/2017 0819   CHOLHDL 3.7 12/20/2017 0819   CHOLHDL 3.4 09/06/2016 1146   VLDL 28 09/06/2016 1146   LDLCALC 84 12/20/2017 0819    Physical Exam:    VS:  BP (!) 148/68   Pulse 68   Ht 5\' 6"  (1.676 m)   Wt 137 lb 3.2 oz (62.2 kg)   SpO2 98%   BMI 22.14 kg/m     Wt Readings from Last 3 Encounters:  01/19/19 137 lb 3.2 oz (62.2 kg)  01/01/19 135 lb (61.2 kg)  12/16/18 139 lb 6.4 oz (63.2 kg)     GEN: Masked with good skin color.. No acute distress HEENT: Normal NECK: No JVD. LYMPHATICS: No lymphadenopathy CARDIAC:  RRR without murmur, gallop, or edema. VASCULAR:  Normal Pulses. No bruits. RESPIRATORY:  Clear to auscultation without rales, wheezing or rhonchi  ABDOMEN: Soft, non-tender, non-distended, No pulsatile mass, MUSCULOSKELETAL: No deformity  SKIN: Warm and dry NEUROLOGIC:  Alert and oriented x 3 PSYCHIATRIC:  Normal affect   ASSESSMENT:    1. Coronary artery disease of bypass graft of native heart with stable angina pectoris ([place])   2. Chronic diastolic heart failure (HCC)   3. Paroxysmal atrial fibrillation ([place])   4. Essential hypertension, benign   5. Obstruction of  carotid artery, unspecified laterality   6. Angina, class III ([place])  7. S/P Maze operation for atrial fibrillation   8. Elevated LDL cholesterol level   9. Educated About Covid-19 Virus Infection    PLAN:    In order of problems listed above:  1. Med therapy for now with Imdur 60 mg/day which dramatically improved angina control prior to heart catheterization.  Long discussion with the patient and daughter concerning alternative treatment which would be CTO/PCI treatment.  Patient is reluctant to proceed feeling as well as she is now.  Plan to see the patient back in 1 month.  Consolidate isosorbide into a single daily dose regimen.  Rather than taking 30 mg twice daily she will take 60 mg nightly.  She will notify us if significant increase in anginal complaints.  Also explained that she can use sublingual nitroglycerin in addition to isosorbide. 2. No evidence of volume overload 3. No recent issues with this problem. 4. Blood pressures are relatively low.  May need to decrease intensity of benazepril or HCTZ.  We will plan clinical follow-up in weeks.  Earlier if increasing symptoms.  Greater than 50% of the time during this office visit was spent in education, counseling, and coordination of care related to underlying disease process and testing as outlined.  Conversation was three-way with patient, myself, and daughter on phone.   Medication Adjustments/Labs and Tests Ordered: Current medicines are reviewed at length with the patient today.  Concerns regarding medicines are outlined above.  No orders of the defined types were placed in this encounter.  Meds ordered this encounter  Medications  . isosorbide mononitrate (IMDUR) 60 MG 24 hr tablet    Sig: Take 1 tablet (60 mg total) by mouth daily.    Dispense:  90 tablet    Refill:  3    Patient Instructions  Medication Instructions:  1) TAKE Isosorbide 60mg  once daily  If you need a refill on your cardiac medications before  your next appointment, please call your pharmacy.   Lab work: None If you have labs (blood work) drawn today and your tests are completely normal, you will receive your results only by: Marland Kitchen MyChart Message (if you have MyChart) OR . A paper copy in the mail If you have any lab test that is abnormal or we need to change your treatment, we will call you to review the results.  Testing/Procedures: None  Follow-Up: At [facility], you and your health needs are our priority.  As part of our continuing mission to provide you with exceptional heart care, we have created designated Provider Care Teams.  These Care Teams include your primary Cardiologist (physician) and Advanced Practice Providers (APPs -  Physician Assistants and Nurse Practitioners) who all work together to provide you with the care you need, when you need it. You will need a follow up appointment in 1 month.  Please call our office 2 months in advance to schedule this appointment.  You may see Sinclair Grooms, MD  or one of the following Advanced Practice Providers on your designated Care Team:   Truitt Merle, NP Cecilie Kicks, NP . Kathyrn Drown, NP  Any Other Special Instructions Will Be Listed Below (If Applicable).       Signed, Sinclair Grooms, MD  01/19/2019 6:10 PM    [facility]

## 2019-01-19 ENCOUNTER — Other Ambulatory Visit: Payer: Self-pay

## 2019-01-19 ENCOUNTER — Encounter: Payer: Self-pay | Admitting: Interventional Cardiology

## 2019-01-19 ENCOUNTER — Ambulatory Visit (INDEPENDENT_AMBULATORY_CARE_PROVIDER_SITE_OTHER): Payer: Medicare Other | Admitting: Interventional Cardiology

## 2019-01-19 VITALS — BP 148/68 | HR 68 | Ht 66.0 in | Wt 137.2 lb

## 2019-01-19 DIAGNOSIS — I25708 Atherosclerosis of coronary artery bypass graft(s), unspecified, with other forms of angina pectoris: Secondary | ICD-10-CM | POA: Diagnosis not present

## 2019-01-19 DIAGNOSIS — E7800 Pure hypercholesterolemia, unspecified: Secondary | ICD-10-CM | POA: Diagnosis not present

## 2019-01-19 DIAGNOSIS — I6529 Occlusion and stenosis of unspecified carotid artery: Secondary | ICD-10-CM | POA: Diagnosis not present

## 2019-01-19 DIAGNOSIS — Z8679 Personal history of other diseases of the circulatory system: Secondary | ICD-10-CM

## 2019-01-19 DIAGNOSIS — Z9889 Other specified postprocedural states: Secondary | ICD-10-CM | POA: Diagnosis not present

## 2019-01-19 DIAGNOSIS — I480 Paroxysmal atrial fibrillation: Secondary | ICD-10-CM | POA: Diagnosis not present

## 2019-01-19 DIAGNOSIS — I209 Angina pectoris, unspecified: Secondary | ICD-10-CM | POA: Diagnosis not present

## 2019-01-19 DIAGNOSIS — Z7189 Other specified counseling: Secondary | ICD-10-CM | POA: Diagnosis not present

## 2019-01-19 DIAGNOSIS — I10 Essential (primary) hypertension: Secondary | ICD-10-CM | POA: Diagnosis not present

## 2019-01-19 DIAGNOSIS — I5032 Chronic diastolic (congestive) heart failure: Secondary | ICD-10-CM

## 2019-01-19 MED ORDER — ISOSORBIDE MONONITRATE ER 60 MG PO TB24: 60.0000 mg | ORAL_TABLET | Freq: Every day | ORAL | 3 refills | Status: DC

## 2019-01-19 NOTE — Patient Instructions (Signed)
Medication Instructions:  1) TAKE Isosorbide 60mg  once daily  If you need a refill on your cardiac medications before your next appointment, please call your pharmacy.   Lab work: None If you have labs (blood work) drawn today and your tests are completely normal, you will receive your results only by: Marland Kitchen MyChart Message (if you have MyChart) OR . A paper copy in the mail If you have any lab test that is abnormal or we need to change your treatment, we will call you to review the results.  Testing/Procedures: None  Follow-Up: At [facility], you and your health needs are our priority.  As part of our continuing mission to provide you with exceptional heart care, we have created designated Provider Care Teams.  These Care Teams include your primary Cardiologist (physician) and Advanced Practice Providers (APPs -  Physician Assistants and Nurse Practitioners) who all work together to provide you with the care you need, when you need it. You will need a follow up appointment in 1 month.  Please call our office 2 months in advance to schedule this appointment.  You may see Sinclair Grooms, MD  or one of the following Advanced Practice Providers on your designated Care Team:   Truitt Merle, NP Cecilie Kicks, NP . Kathyrn Drown, NP  Any Other Special Instructions Will Be Listed Below (If Applicable).

## 2019-01-19 NOTE — Telephone Encounter (Signed)
Pt seeing Dr. Tamala Julian today.

## 2019-02-02 NOTE — Progress Notes (Signed)
Chief Complaint  Patient presents with  . med check    med check, pelvic and breast exam    Patient presents for med check with breast/pelvic exam.  She had virtual AWV in April. Since that time, she has been seeing Dr. Tamala Julian regularly, had cath in July (see below), and last saw Dr. Tamala Julian on 7/27.  She had been having angina.  This improved with increasing the Imdur dose from 30 to 60mg .  She was supposed to have titrated up to 120mg , but instead was taking 30mg  TID. She is currently taking 60mg  just once at night.  She denies lightheadedness. She is taking SL nitroglycerin about 3x/week, when she starts to feel her symptoms coming on (the arms feel tight, spreads across the chest).  This occurs with activity (if she is rushing for something). The nitro helps.  This Imdur is supposed to help increase collateral blood flow. Patient had elected to continue with medical therapy rather than pursuing more invasive procedure. Cath results from 01/01/2019  Total occlusion of the saphenous vein graft to the right coronary.  Native RCA is severely diseased with ostial 60%, proximal to mid 75%, and mid 99%.  There is distal 90% beyond the origin of the PDA and the continuation.  Faint left to right collaterals are noted.  Failed angioplasty of the native right coronary due to inability to cross the severe mid vessel stenosis with a wire. (wire escalation was used).  Patent LIMA to LAD, and patent saphenous vein graft to the circumflex marginal.  90% mid LAD and diffuse 50% proximal LAD stenosis.  Small first diagonal contains 75% ostial stenosis.  Total occlusion of the proximal circumflex. Normal left ventricular function.  LVEDP less than 15.  EF 55%.  Hypertension:  Blood pressures have been running 136/68-70, never over 140.  She had seen some more erratic BP's with frequent lows, none since last med check with cardiologist (maybe when taking the imdur differently, at higher dose (had taken 30mg  TID  at one point).  Hyperlipidemia:  intolerance to zetia and limited tolerance to statins.  She is currently on atorvastatin 10mg . Goal LDL is <70, last was above this. She tried Repatha x 3 doses through the lipid clinic, but didn't tolerate due to cold symptoms (which she looked up and saw was a common side effect). These resolved a few days after stopping medication.  She is due for recheck.  Lab Results  Component Value Date   CHOL 161 12/20/2017   HDL 43 12/20/2017   LDLCALC 84 12/20/2017   TRIG 172 (H) 12/20/2017   CHOLHDL 3.7 12/20/2017   She saw Dr. Jarome Matin, and was treated for skin cancer (squamous) on nose.  She used the cream over the dark spot on the nose as well, and it lightened it. She has f/u scheduled with him. The wart treated here resolved.  Derm tried to drain the cyst on the left index finger, but nothing happened.  It is not painful, but she doesn't like the way it looks.   PMH, PSH, SH reviewed  Outpatient Encounter Medications as of 02/04/2019  Medication Sig Note  . aspirin EC 81 MG tablet Take 1 tablet (81 mg total) by mouth daily.   Marland Kitchen atenolol (TENORMIN) 100 MG tablet TAKE 1 TABLET(100 MG) BY MOUTH DAILY   . atorvastatin (LIPITOR) 10 MG tablet TAKE 1 TABLET BY MOUTH DAILY   . benazepril (LOTENSIN) 40 MG tablet TAKE 1 TABLET(40 MG) BY MOUTH DAILY   .  cholecalciferol (VITAMIN D) 1000 UNITS tablet Take 1,000 Units by mouth daily.   . furosemide (LASIX) 20 MG tablet Take 1 tablet (20 mg total) by mouth as needed for fluid or edema. Reported on 12/19/2015   . GLUCOSAMINE-CHONDROITIN-VIT D3 PO Take 1 tablet by mouth daily.    . hydrochlorothiazide (HYDRODIURIL) 25 MG tablet TAKE 1 TABLET(25 MG) BY MOUTH DAILY   . isosorbide mononitrate (IMDUR) 60 MG 24 hr tablet Take 1 tablet (60 mg total) by mouth daily.   . Multiple Vitamin (MULTIVITAMIN) tablet Take 1 tablet by mouth daily.   . nitroGLYCERIN (NITROSTAT) 0.4 MG SL tablet Place 1 tablet (0.4 mg total) under the  tongue every 5 (five) minutes as needed for chest pain. 02/04/2019: Needing about 3x/week  . potassium chloride (K-DUR) 10 MEQ tablet Take 10 mEq by mouth every 3 (three) days.  02/04/2019: Taking OTC potassium supplement (from [employer]) every other day  . clopidogrel (PLAVIX) 75 MG tablet Take 1 tablet (75 mg total) by mouth daily. (Patient not taking: Reported on 02/04/2019) 02/04/2019: Told to stop (because stent not placed)   No facility-administered encounter medications on file as of 02/04/2019.    Allergies  Allergen Reactions  . Zetia [Ezetimibe] Other (See Comments)    Muscle cramps  . Oxycodone Nausea And Vomiting and Other (See Comments)    Extreme nausea and vomiting  . Repatha [Evolocumab] Other (See Comments)    Muscle cramps   ROS:  No fever, chills, URI symptoms, allergies, no GI or GU complaints, no joint pain.  No bleeding, bruising or rash.  No headaches, dizziness.  Angina per HPI.  No edema. Moods are good. See HPI.    PHYSICAL EXAM:  BP 130/70   Pulse 63   Temp 97.8 F (36.6 C) (Temporal)   Resp 16   Ht 5\' 5"  (1.651 m)   Wt 133 lb 12.8 oz (60.7 kg)   SpO2 98%   BMI 22.27 kg/m   Wt Readings from Last 3 Encounters:  02/04/19 133 lb 12.8 oz (60.7 kg)  01/19/19 137 lb 3.2 oz (62.2 kg)  01/01/19 135 lb (61.2 kg)     General Appearance:  Alert, cooperative, no distress, appears stated age   Head:  Normocephalic, without obvious abnormality, atraumatic   Eyes:  PERRL, conjunctiva/corneas clear, EOM's intact, fundi benign   Ears:  Normal TM's and external ear canals   Nose:  Not examined, wearing mask due to COVID-19 pandemic  Throat:  Not examined, wearing mask due to COVID-19 pandemic  Neck:  Supple, no lymphadenopathy; thyroid: no enlargement/tenderness/ nodules; slight carotid bruit noted on right, none on left  Back:  Spine nontender, no curvature, ROM normal, no CVA tenderness   Lungs:  Clear to auscultation bilaterally without wheezes,  rales or ronchi; respirations unlabored   Chest Wall:  No tenderness or deformity. WHSS  Heart:  Regular rate and rhythm, S1 and S2 normal, no murmur, rub or gallop   Breast Exam:  No tenderness, masses, or nipple discharge or inversion. No axillary lymphadenopathy. WHSS left breast  Abdomen:  Soft, non-tender, nondistended, normoactive bowel sounds,no masses, no hepatosplenomegaly   Genitalia:  Normal external genitalia without lesions; +atrophic changes. BUS and vagina normal; normal bimanual exam without masses. uterus is surgically absent.  Rectal:  Normal tone, no masses or tenderness; heme negative brown stool  Extremities:  No clubbing, cyanosis or edema   Pulses:  2+ and symmetric all extremities   Skin:  Skin color, texture, turgor normal,  no rashes or lesions. Slightly hyperpigmented macular area on the right side of her nose.  Nodule left index finger, radial aspect of DIP, nontender  Lymph nodes:  Cervical, supraclavicular, and axillary nodes normal   Neurologic:  CNII-XII intact, normal strength, sensation and gait; reflexes 2+ and symmetric throughout   Psych: Normal mood, affect, hygiene and grooming   ASSESSMENT/PLAN:  Essential hypertension, benign - normal here, borderline at home. cont current regimen - Plan: Comprehensive metabolic panel  Coronary artery disease involving autologous vein coronary bypass graft with angina pectoris ([place]) - requiring NTG 3x/wk, likely can tolerate higher dose of imdur.  She has f/u with cardiologist next week.   - Plan: Lipid panel  Pure hypercholesterolemia - Goal LDL <70; willing to try higher dose of atorvastatin if not at goal.  Prev intolerant of Repatha, higher doses of statins - Plan: Lipid panel  Medication monitoring encounter - Plan: Comprehensive metabolic panel, Lipid panel   If LDL >70, she is willing to consider re-trying higher dose of lipitor (20mg ).  Send lipid results to Dr.  Tamala Julian (and will note her 3x/wk use of nitro, on imdur 60mg )--has upcoming f/u appt.

## 2019-02-04 ENCOUNTER — Other Ambulatory Visit: Payer: Self-pay

## 2019-02-04 ENCOUNTER — Ambulatory Visit (INDEPENDENT_AMBULATORY_CARE_PROVIDER_SITE_OTHER): Payer: Medicare Other | Admitting: Family Medicine

## 2019-02-04 ENCOUNTER — Encounter: Payer: Self-pay | Admitting: Family Medicine

## 2019-02-04 VITALS — BP 130/70 | HR 63 | Temp 97.8°F | Resp 16 | Ht 65.0 in | Wt 133.8 lb

## 2019-02-04 DIAGNOSIS — I10 Essential (primary) hypertension: Secondary | ICD-10-CM

## 2019-02-04 DIAGNOSIS — E7800 Pure hypercholesterolemia, unspecified: Secondary | ICD-10-CM | POA: Diagnosis not present

## 2019-02-04 DIAGNOSIS — Z5181 Encounter for therapeutic drug level monitoring: Secondary | ICD-10-CM

## 2019-02-04 DIAGNOSIS — I25719 Atherosclerosis of autologous vein coronary artery bypass graft(s) with unspecified angina pectoris: Secondary | ICD-10-CM

## 2019-02-05 LAB — COMPREHENSIVE METABOLIC PANEL
ALT: 12 IU/L (ref 0–32)
AST: 22 IU/L (ref 0–40)
Albumin/Globulin Ratio: 1.5 (ref 1.2–2.2)
Albumin: 4.3 g/dL (ref 3.7–4.7)
Alkaline Phosphatase: 55 IU/L (ref 39–117)
BUN/Creatinine Ratio: 18 (ref 12–28)
BUN: 14 mg/dL (ref 8–27)
Bilirubin Total: 0.4 mg/dL (ref 0.0–1.2)
CO2: 24 mmol/L (ref 20–29)
Calcium: 9.8 mg/dL (ref 8.7–10.3)
Chloride: 96 mmol/L (ref 96–106)
Creatinine, Ser: 0.76 mg/dL (ref 0.57–1.00)
GFR calc Af Amer: 89 mL/min/{1.73_m2} (ref >59)
GFR calc non Af Amer: 77 mL/min/{1.73_m2} (ref >59)
Globulin, Total: 2.8 g/dL (ref 1.5–4.5)
Glucose: 101 mg/dL — ABNORMAL HIGH (ref 65–99)
Potassium: 3.5 mmol/L (ref 3.5–5.2)
Sodium: 136 mmol/L (ref 134–144)
Total Protein: 7.1 g/dL (ref 6.0–8.5)

## 2019-02-05 LAB — LIPID PANEL
Chol/HDL Ratio: 3.6 ratio (ref 0.0–4.4)
Cholesterol, Total: 189 mg/dL (ref 100–199)
HDL: 52 mg/dL (ref >39)
LDL Calculated: 111 mg/dL — ABNORMAL HIGH (ref 0–99)
Triglycerides: 132 mg/dL (ref 0–149)
VLDL Cholesterol Cal: 26 mg/dL (ref 5–40)

## 2019-02-17 NOTE — Progress Notes (Signed)
Cardiology Office Note   Date:  02/19/2019   ID:  Beckley, Przybylski September 27, 1940, MRN [id number]  PCP:  Rita Ohara, MD  Cardiologist:  Dr. Tamala Julian     Chief Complaint  Patient presents with  . Chest Pain      History of Present Illness: Claudia Howell is a 78 y.o. female who presents for follow up of angina.  She has a hx of coronary artery disease with bypass graft(January/2014), saphenous vein graft stent(10/2012), hypertension, acute on chronic systolic and diastolic heart failure, history of paroxysmal atrial fibrillation,Maze procedure doing coronary bypass grafting, and essential hypertension.  The patient has not increased isosorbide to 120 mg as recommended.  She has taken no more than 90 mg.  She began taking isosorbide 30 mg 3 times per day.  States that she feels somewhat lightheaded and weak taking the medication at this dose.  She reveals that the increase in isosorbide mononitrate from 30 to 60 mg/day dramatically improved angina that she was having.  Since the medication change was made prior to coronary angiography, she has had much less and essentially no angina.  She has been able to be active at home.  Discussed with the patient and her daughter that we noted collaterals to the right coronary during the angiography.  The goal with increasing isosorbide was to help improve collateral blood flow.  I have spoken to Dr. Martinique and Dr. Irish Lack who feel that CTO-like therapy of the right coronary could be accomplished if needed.  I discussed this with the patient who currently feels that medical therapy is working and that she would prefer this more conservative approach for the time being.  Continue imdur 60 mg daily.  If BP lower end would decrease benazepril or HCTZ.    Having chest pain using SL NTG 3X week in addition to imdur.  Statin issues   Pt on lipitor 10 mg and we discussed increasing to 20 but she is barely able to take 10, so will leave where she is.  I  will notify Dr. Tamala Julian. Pt did not tolerate repatha.   Currently taking SL NTG 1-2 per week.  Still with episodic chest pain.    Past Medical History:  Diagnosis Date  . Adenomatous colon polyp 10/09  . Coronary artery disease    Last cath 12/14 Cath 12/8 100% SVG occlusion to RCA, 95% SVG.  The stenosis to OM.  LIMA to LAD patent but diffuse disease in LAD after graft.  Overlapping stents mid/distal SVG to OM.  Marland Kitchen Decubitus ulcer of coccyx 10/22/12   1/2 inch raw open area on coccyx  . Hyperlipidemia   . Hypertension    Dr.Henry Tamala Julian  . Osteopenia 9/09  . PAF (paroxysmal atrial fibrillation) ([place])    during cath/notes 10/16/2012  . S/P CABG x 3 10/22/2012   LIMA to LAD, SVG to distal RCA, SVG to OM, EVH from right thigh  . S/P Maze operation for atrial fibrillation 10/22/2012   Left side lesion set using bipolar radiofrequency and cryothermy ablation with clipping of LA appendage    Past Surgical History:  Procedure Laterality Date  . BREAST CYST EXCISION Left 1980-1990's   x 3  . CARDIAC CATHETERIZATION  10/16/2012  . CATARACT EXTRACTION, BILATERAL Bilateral    03/2015 and 05/2015  . CATARACT EXTRACTION, BILATERAL Bilateral approx 2016  . CORONARY ANGIOPLASTY WITH STENT PLACEMENT  04/01/2013   "1" (06/01/2013)  . CORONARY ARTERY BYPASS GRAFT N/A 10/22/2012  Procedure: CORONARY ARTERY BYPASS GRAFTING (CABG);  Surgeon: Rexene Alberts, MD;  Location: [facility];  Service: Open Heart Surgery;  Laterality: N/A;  . CORONARY STENT INTERVENTION N/A 01/01/2019   Procedure: CORONARY STENT INTERVENTION;  Surgeon: Belva Crome, MD;  Location: [facility];  Service: Cardiovascular;  Laterality: N/A;  . DILATION AND CURETTAGE OF UTERUS    . INTRAOPERATIVE TRANSESOPHAGEAL ECHOCARDIOGRAM N/A 10/22/2012   Procedure: INTRAOPERATIVE TRANSESOPHAGEAL ECHOCARDIOGRAM;  Surgeon: Rexene Alberts, MD;  Location: [facility];  Service: Open Heart Surgery;  Laterality: N/A;  . LEFT HEART CATH AND CORS/GRAFTS  ANGIOGRAPHY N/A 01/01/2019   Procedure: LEFT HEART CATH AND CORS/GRAFTS ANGIOGRAPHY;  Surgeon: Belva Crome, MD;  Location: [facility];  Service: Cardiovascular;  Laterality: N/A;  . LEFT HEART CATHETERIZATION WITH CORONARY ANGIOGRAM N/A 10/16/2012   Procedure: LEFT HEART CATHETERIZATION WITH CORONARY ANGIOGRAM;  Surgeon: Sinclair Grooms, MD;  Location: [facility] CATH LAB;  Service: Cardiovascular;  Laterality: N/A;  . LEFT HEART CATHETERIZATION WITH CORONARY/GRAFT ANGIOGRAM N/A 06/01/2013   Procedure: LEFT HEART CATHETERIZATION WITH Beatrix Fetters;  Surgeon: Sinclair Grooms, MD;  Location: [facility] CATH LAB;  Service: Cardiovascular;  Laterality: N/A;  . MAZE N/A 10/22/2012   Procedure: MAZE;  Surgeon: Rexene Alberts, MD;  Location: [facility];  Service: Open Heart Surgery;  Laterality: N/A;  . PERCUTANEOUS CORONARY STENT INTERVENTION (PCI-S)  06/01/2013   Procedure: PERCUTANEOUS CORONARY STENT INTERVENTION (PCI-S);  Surgeon: Sinclair Grooms, MD;  Location: [facility] CATH LAB;  Service: Cardiovascular;;  . TONSILLECTOMY  1949  . TOTAL ABDOMINAL HYSTERECTOMY W/ BILATERAL SALPINGOOPHORECTOMY  1990   benign growth     Current Outpatient Medications  Medication Sig Dispense Refill  . aspirin EC 81 MG tablet Take 1 tablet (81 mg total) by mouth daily.    Marland Kitchen atenolol (TENORMIN) 100 MG tablet TAKE 1 TABLET(100 MG) BY MOUTH DAILY 90 tablet 3  . atorvastatin (LIPITOR) 10 MG tablet TAKE 1 TABLET BY MOUTH DAILY 90 tablet 0  . benazepril (LOTENSIN) 40 MG tablet TAKE 1 TABLET(40 MG) BY MOUTH DAILY 90 tablet 3  . cholecalciferol (VITAMIN D) 1000 UNITS tablet Take 1,000 Units by mouth daily.    . furosemide (LASIX) 20 MG tablet Take 1 tablet (20 mg total) by mouth as needed for fluid or edema. Reported on 12/19/2015 30 tablet 4  . GLUCOSAMINE-CHONDROITIN-VIT D3 PO Take 1 tablet by mouth daily.     . hydrochlorothiazide (HYDRODIURIL) 25 MG tablet TAKE 1 TABLET(25 MG) BY MOUTH DAILY 90 tablet 3  . isosorbide  mononitrate (IMDUR) 60 MG 24 hr tablet Take 1 tablet (60 mg total) by mouth daily. 90 tablet 3  . Multiple Vitamin (MULTIVITAMIN) tablet Take 1 tablet by mouth daily.    . nitroGLYCERIN (NITROSTAT) 0.4 MG SL tablet Place 1 tablet (0.4 mg total) under the tongue every 5 (five) minutes as needed for chest pain. 25 tablet 3  . potassium chloride (K-DUR) 10 MEQ tablet Take 10 mEq by mouth every 3 (three) days.      No current facility-administered medications for this visit.     Allergies:   Zetia [ezetimibe], Oxycodone, and Repatha [evolocumab]    Social History:  The patient  reports that she has never smoked. She has never used smokeless tobacco. She reports current alcohol use of about 1.0 standard drinks of alcohol per week. She reports that she does not use drugs.   Family History:  The patient's family history includes  Cancer in her brother; Heart attack (age of onset: 51) in her mother; Heart disease in an other family member; Heart disease (age of onset: 10) in her brother; Hypertension in her brother and mother.    ROS:  General:no colds or fevers, no weight changes Skin:no rashes or ulcers HEENT:no blurred vision, no congestion CV:see HPI PUL:see HPI GI:no diarrhea constipation or melena, no indigestion GU:no hematuria, no dysuria MS:no joint pain, no claudication Neuro:no syncope, no lightheadedness Endo:no diabetes, no thyroid disease  Wt Readings from Last 3 Encounters:  02/19/19 137 lb (62.1 kg)  02/04/19 133 lb 12.8 oz (60.7 kg)  01/19/19 137 lb 3.2 oz (62.2 kg)     PHYSICAL EXAM: VS:  BP 130/68   Pulse 72   Ht 5\' 5"  (1.651 m)   Wt 137 lb (62.1 kg)   SpO2 97%   BMI 22.80 kg/m  , BMI Body mass index is 22.8 kg/m. General:Pleasant affect, NAD Skin:Warm and dry, brisk capillary refill HEENT:normocephalic, sclera clear, mucus membranes moist Neck:supple, no JVD, no bruits  Heart:S1S2 RRR without murmur, gallup, rub or click Lungs:clear without rales, rhonchi, or  wheezes [id number], non tender, + BS, do not palpate liver spleen or masses Ext:no lower ext edema, 2+ pedal pulses, 2+ radial pulses Neuro:alert and oriented X 3, MAE, follows commands, + facial symmetry    EKG:  EKG is NOT ordered today.   Recent Labs: 12/16/2018: Hemoglobin 12.1; Platelets 232 02/04/2019: ALT 12; BUN 14; Creatinine, Ser 0.76; Potassium 3.5; Sodium 136    Lipid Panel    Component Value Date/Time   CHOL 189 02/04/2019 1157   TRIG 132 02/04/2019 1157   HDL 52 02/04/2019 1157   CHOLHDL 3.6 02/04/2019 1157   CHOLHDL 3.4 09/06/2016 1146   VLDL 28 09/06/2016 1146   LDLCALC 111 (H) 02/04/2019 1157       Other studies Reviewed: Additional studies/ records that were reviewed today include: . Cardiac cath 01/01/19  Total occlusion of the saphenous vein graft to the right coronary.  Native RCA is severely diseased with ostial 60%, proximal to mid 75%, and mid 99%.  There is distal 90% beyond the origin of the PDA and the continuation.  Faint left to right collaterals are noted.  Failed angioplasty of the native right coronary due to inability to cross the severe mid vessel stenosis with a wire. (wire escalation was used).  Patent LIMA to LAD, and patent saphenous vein graft to the circumflex marginal.  90% mid LAD and diffuse 50% proximal LAD stenosis.  Small first diagonal contains 75% ostial stenosis.  Total occlusion of the proximal circumflex.  Normal left ventricular function.  LVEDP less than 15.  EF 55%.   RECOMMENDATIONS:   Continue medical therapy for control of angina.  This will include beta-blocker and long-acting nitrate therapy.  Encouraging that collaterals are noted and hopefully will improve with time.  Consult CTO team to determine if this lesion could be treated as a CTO.  Continue Plavix for now.  Bedrest for 4 hours following Mynx.  Discharge later today if stable.  ASSESSMENT AND PLAN:  1.  Angina stable, ie: has 1-2 times per  week needing NTG, we discussed increasing her imdur but she is comfortable with her symptoms at this time - she understands to call if more freq episodes   2.   CAD with possible CTO in future of RCA with both native and bypass graft disease.  For now pt prefers medical therapy.  3.  HLD she has side effects with lipitor 10 so she does not wish to go higher, though she would benefit from 20 mg.    Her LDL is 111. Goal < 70   4.  HTN controlled.  No change in meds today  Follow up with Dr. Tamala Julian in 2 months, virtually is fine unless increased angina in meantime.     Current medicines are reviewed with the patient today.  The patient Has no concerns regarding medicines.  The following changes have been made:  See above Labs/ tests ordered today include:see above  Disposition:   FU:  see above  Signed, Cecilie Kicks, NP  02/19/2019 1:54 PM    [facility] [place], [address] [place], [place] Phone: [phone]; Fax: [phone]

## 2019-02-19 ENCOUNTER — Other Ambulatory Visit: Payer: Self-pay

## 2019-02-19 ENCOUNTER — Ambulatory Visit (INDEPENDENT_AMBULATORY_CARE_PROVIDER_SITE_OTHER): Payer: Medicare Other | Admitting: Cardiology

## 2019-02-19 ENCOUNTER — Encounter: Payer: Self-pay | Admitting: Cardiology

## 2019-02-19 VITALS — BP 130/68 | HR 72 | Ht 65.0 in | Wt 137.0 lb

## 2019-02-19 DIAGNOSIS — I10 Essential (primary) hypertension: Secondary | ICD-10-CM | POA: Diagnosis not present

## 2019-02-19 DIAGNOSIS — I25708 Atherosclerosis of coronary artery bypass graft(s), unspecified, with other forms of angina pectoris: Secondary | ICD-10-CM

## 2019-02-19 DIAGNOSIS — I209 Angina pectoris, unspecified: Secondary | ICD-10-CM | POA: Diagnosis not present

## 2019-02-19 DIAGNOSIS — Z951 Presence of aortocoronary bypass graft: Secondary | ICD-10-CM | POA: Diagnosis not present

## 2019-02-19 DIAGNOSIS — E7800 Pure hypercholesterolemia, unspecified: Secondary | ICD-10-CM

## 2019-02-19 NOTE — Patient Instructions (Signed)
Medication Instructions:  Your physician recommends that you continue on your current medications as directed. Please refer to the Current Medication list given to you today.  If you need a refill on your cardiac medications before your next appointment, please call your pharmacy.   Lab work: None ordered  If you have labs (blood work) drawn today and your tests are completely normal, you will receive your results only by: Marland Kitchen MyChart Message (if you have MyChart) OR . A paper copy in the mail If you have any lab test that is abnormal or we need to change your treatment, we will call you to review the results.  Testing/Procedures: None ordered  Follow-Up: At [facility], you and your health needs are our priority.  As part of our continuing mission to provide you with exceptional heart care, we have created designated Provider Care Teams.  These Care Teams include your primary Cardiologist (physician) and Advanced Practice Providers (APPs -  Physician Assistants and Nurse Practitioners) who all work together to provide you with the care you need, when you need it. You will need a follow up appointment in 05/04/2019 at 4:00 in the office.  Any Other Special Instructions Will Be Listed Below (If Applicable).

## 2019-03-25 ENCOUNTER — Other Ambulatory Visit: Payer: Self-pay

## 2019-03-25 ENCOUNTER — Other Ambulatory Visit (INDEPENDENT_AMBULATORY_CARE_PROVIDER_SITE_OTHER): Payer: Medicare Other

## 2019-03-25 DIAGNOSIS — Z23 Encounter for immunization: Secondary | ICD-10-CM

## 2019-04-24 ENCOUNTER — Telehealth: Payer: Self-pay | Admitting: Interventional Cardiology

## 2019-04-24 ENCOUNTER — Other Ambulatory Visit: Payer: Self-pay

## 2019-04-24 ENCOUNTER — Encounter (HOSPITAL_COMMUNITY): Payer: Self-pay | Admitting: *Deleted

## 2019-04-24 ENCOUNTER — Emergency Department (HOSPITAL_COMMUNITY): Payer: Medicare Other

## 2019-04-24 ENCOUNTER — Inpatient Hospital Stay (HOSPITAL_COMMUNITY)
Admission: EM | Admit: 2019-04-24 | Discharge: 2019-04-30 | DRG: 247 | Disposition: A | Discharge status: Home / Self Care | Payer: Medicare Other | Attending: Internal Medicine | Admitting: Internal Medicine

## 2019-04-24 DIAGNOSIS — M8580 Other specified disorders of bone density and structure, unspecified site: Secondary | ICD-10-CM | POA: Diagnosis present

## 2019-04-24 DIAGNOSIS — I25110 Atherosclerotic heart disease of native coronary artery with unstable angina pectoris: Secondary | ICD-10-CM | POA: Diagnosis present

## 2019-04-24 DIAGNOSIS — I5032 Chronic diastolic (congestive) heart failure: Secondary | ICD-10-CM | POA: Diagnosis present

## 2019-04-24 DIAGNOSIS — Z951 Presence of aortocoronary bypass graft: Secondary | ICD-10-CM

## 2019-04-24 DIAGNOSIS — Z7982 Long term (current) use of aspirin: Secondary | ICD-10-CM | POA: Diagnosis not present

## 2019-04-24 DIAGNOSIS — I214 Non-ST elevation (NSTEMI) myocardial infarction: Principal | ICD-10-CM | POA: Diagnosis present

## 2019-04-24 DIAGNOSIS — I10 Essential (primary) hypertension: Secondary | ICD-10-CM | POA: Diagnosis present

## 2019-04-24 DIAGNOSIS — Z20828 Contact with and (suspected) exposure to other viral communicable diseases: Secondary | ICD-10-CM | POA: Diagnosis not present

## 2019-04-24 DIAGNOSIS — I340 Nonrheumatic mitral (valve) insufficiency: Secondary | ICD-10-CM | POA: Diagnosis not present

## 2019-04-24 DIAGNOSIS — Z955 Presence of coronary angioplasty implant and graft: Secondary | ICD-10-CM

## 2019-04-24 DIAGNOSIS — Z79899 Other long term (current) drug therapy: Secondary | ICD-10-CM

## 2019-04-24 DIAGNOSIS — E785 Hyperlipidemia, unspecified: Secondary | ICD-10-CM | POA: Diagnosis present

## 2019-04-24 DIAGNOSIS — I480 Paroxysmal atrial fibrillation: Secondary | ICD-10-CM | POA: Diagnosis not present

## 2019-04-24 DIAGNOSIS — Z8249 Family history of ischemic heart disease and other diseases of the circulatory system: Secondary | ICD-10-CM | POA: Diagnosis not present

## 2019-04-24 DIAGNOSIS — I110 Hypertensive heart disease with heart failure: Secondary | ICD-10-CM | POA: Diagnosis present

## 2019-04-24 DIAGNOSIS — I25810 Atherosclerosis of coronary artery bypass graft(s) without angina pectoris: Secondary | ICD-10-CM | POA: Diagnosis present

## 2019-04-24 DIAGNOSIS — I200 Unstable angina: Secondary | ICD-10-CM | POA: Diagnosis not present

## 2019-04-24 DIAGNOSIS — I4891 Unspecified atrial fibrillation: Secondary | ICD-10-CM | POA: Diagnosis present

## 2019-04-24 DIAGNOSIS — I2582 Chronic total occlusion of coronary artery: Secondary | ICD-10-CM | POA: Diagnosis not present

## 2019-04-24 DIAGNOSIS — I25710 Atherosclerosis of autologous vein coronary artery bypass graft(s) with unstable angina pectoris: Secondary | ICD-10-CM | POA: Diagnosis present

## 2019-04-24 DIAGNOSIS — Z91041 Radiographic dye allergy status: Secondary | ICD-10-CM

## 2019-04-24 DIAGNOSIS — R079 Chest pain, unspecified: Secondary | ICD-10-CM | POA: Diagnosis not present

## 2019-04-24 DIAGNOSIS — R7989 Other specified abnormal findings of blood chemistry: Secondary | ICD-10-CM

## 2019-04-24 DIAGNOSIS — E7800 Pure hypercholesterolemia, unspecified: Secondary | ICD-10-CM | POA: Diagnosis not present

## 2019-04-24 DIAGNOSIS — R778 Other specified abnormalities of plasma proteins: Secondary | ICD-10-CM

## 2019-04-24 DIAGNOSIS — R000 Tachycardia, unspecified: Secondary | ICD-10-CM | POA: Diagnosis not present

## 2019-04-24 HISTORY — DX: Heart failure, unspecified: I50.9

## 2019-04-24 LAB — BASIC METABOLIC PANEL
Anion gap: 15 (ref 5–15)
BUN: 14 mg/dL (ref 8–23)
CO2: 22 mmol/L (ref 22–32)
Calcium: 9.1 mg/dL (ref 8.9–10.3)
Chloride: 96 mmol/L — ABNORMAL LOW (ref 98–111)
Creatinine, Ser: 0.83 mg/dL (ref 0.44–1.00)
GFR calc Af Amer: >60 mL/min (ref >60)
GFR calc non Af Amer: >60 mL/min (ref >60)
Glucose, Bld: 127 mg/dL — ABNORMAL HIGH (ref 70–99)
Potassium: 3.2 mmol/L — ABNORMAL LOW (ref 3.5–5.1)
Sodium: 133 mmol/L — ABNORMAL LOW (ref 135–145)

## 2019-04-24 LAB — CBC WITH DIFFERENTIAL/PLATELET
Abs Immature Granulocytes: 0.02 10*3/uL (ref 0.00–0.07)
Basophils Absolute: 0.1 10*3/uL (ref 0.0–0.1)
Basophils Relative: 1 %
Eosinophils Absolute: 0.2 10*3/uL (ref 0.0–0.5)
Eosinophils Relative: 3 %
HCT: 39.9 % (ref 36.0–46.0)
Hemoglobin: 13.6 g/dL (ref 12.0–15.0)
Immature Granulocytes: 0 %
Lymphocytes Relative: 27 %
Lymphs Abs: 1.8 10*3/uL (ref 0.7–4.0)
MCH: 31.6 pg (ref 26.0–34.0)
MCHC: 34.1 g/dL (ref 30.0–36.0)
MCV: 92.8 fL (ref 80.0–100.0)
Monocytes Absolute: 0.7 10*3/uL (ref 0.1–1.0)
Monocytes Relative: 11 %
Neutro Abs: 3.8 10*3/uL (ref 1.7–7.7)
Neutrophils Relative %: 58 %
Platelets: 205 10*3/uL (ref 150–400)
RBC: 4.3 MIL/uL (ref 3.87–5.11)
RDW: 11.9 % (ref 11.5–15.5)
WBC: 6.6 10*3/uL (ref 4.0–10.5)
nRBC: 0 % (ref 0.0–0.2)

## 2019-04-24 LAB — HEPATIC FUNCTION PANEL
ALT: 15 U/L (ref 0–44)
AST: 21 U/L (ref 15–41)
Albumin: 3.9 g/dL (ref 3.5–5.0)
Alkaline Phosphatase: 47 U/L (ref 38–126)
Bilirubin, Direct: 0.1 mg/dL (ref 0.0–0.2)
Indirect Bilirubin: 0.7 mg/dL (ref 0.3–0.9)
Total Bilirubin: 0.8 mg/dL (ref 0.3–1.2)
Total Protein: 7.5 g/dL (ref 6.5–8.1)

## 2019-04-24 LAB — TROPONIN I (HIGH SENSITIVITY)
Troponin I (High Sensitivity): 10 ng/L (ref <18)
Troponin I (High Sensitivity): 75 ng/L — ABNORMAL HIGH (ref <18)
Troponin I (High Sensitivity): 383 ng/L (ref <18)
Troponin I (High Sensitivity): 744 ng/L (ref <18)

## 2019-04-24 LAB — LIPASE, BLOOD: Lipase: 40 U/L (ref 11–51)

## 2019-04-24 LAB — BRAIN NATRIURETIC PEPTIDE: B Natriuretic Peptide: 155.8 pg/mL — ABNORMAL HIGH (ref 0.0–100.0)

## 2019-04-24 LAB — HEPARIN LEVEL (UNFRACTIONATED): Heparin Unfractionated: 0.19 IU/mL — ABNORMAL LOW (ref 0.30–0.70)

## 2019-04-24 LAB — SARS CORONAVIRUS 2 BY RT PCR (HOSPITAL ORDER, PERFORMED IN ~~LOC~~ HOSPITAL LAB): SARS Coronavirus 2: NEGATIVE

## 2019-04-24 MED ORDER — ASPIRIN 300 MG RE SUPP: 300.0000 mg | RECTAL | Status: AC

## 2019-04-24 MED ORDER — ACETAMINOPHEN 325 MG PO TABS: 650.0000 mg | ORAL_TABLET | ORAL | Status: DC | PRN

## 2019-04-24 MED ORDER — NITROGLYCERIN 0.4 MG SL SUBL: 0.4000 mg | SUBLINGUAL_TABLET | SUBLINGUAL | Status: DC | PRN

## 2019-04-24 MED ORDER — ATORVASTATIN CALCIUM 10 MG PO TABS
10.0000 mg | ORAL_TABLET | Freq: Every day | ORAL | Status: DC
  Administered 2019-04-25: 10 mg via ORAL
  Filled 2019-04-24: qty 1

## 2019-04-24 MED ORDER — HEPARIN BOLUS VIA INFUSION
1000.0000 [IU] | Freq: Once | INTRAVENOUS | Status: AC
  Administered 2019-04-24: 1000 [IU] via INTRAVENOUS
  Filled 2019-04-24: qty 1000

## 2019-04-24 MED ORDER — CLOPIDOGREL BISULFATE 300 MG PO TABS
300.0000 mg | ORAL_TABLET | Freq: Once | ORAL | Status: AC
  Administered 2019-04-24: 300 mg via ORAL
  Filled 2019-04-24: qty 1

## 2019-04-24 MED ORDER — NITROGLYCERIN 0.4 MG SL SUBL
0.4000 mg | SUBLINGUAL_TABLET | SUBLINGUAL | Status: DC | PRN
  Administered 2019-04-25: 0.4 mg via SUBLINGUAL
  Filled 2019-04-24 (×2): qty 1

## 2019-04-24 MED ORDER — NITROGLYCERIN IN D5W 200-5 MCG/ML-% IV SOLN
0.0000 ug/min | INTRAVENOUS | Status: DC
  Administered 2019-04-24: 5 ug/min via INTRAVENOUS
  Filled 2019-04-24 (×2): qty 250

## 2019-04-24 MED ORDER — ONDANSETRON HCL 4 MG/2ML IJ SOLN: 4.0000 mg | Freq: Four times a day (QID) | INTRAMUSCULAR | Status: DC | PRN

## 2019-04-24 MED ORDER — FENTANYL CITRATE (PF) 100 MCG/2ML IJ SOLN
50.0000 ug | Freq: Once | INTRAMUSCULAR | Status: AC
  Administered 2019-04-24: 50 ug via INTRAVENOUS
  Filled 2019-04-24: qty 2

## 2019-04-24 MED ORDER — HEPARIN (PORCINE) 25000 UT/250ML-% IV SOLN
900.0000 [IU]/h | INTRAVENOUS | Status: DC
  Administered 2019-04-24: 700 [IU]/h via INTRAVENOUS
  Administered 2019-04-25 – 2019-04-27 (×3): 900 [IU]/h via INTRAVENOUS
  Filled 2019-04-24 (×4): qty 250

## 2019-04-24 MED ORDER — SODIUM CHLORIDE 0.9% FLUSH
3.0000 mL | Freq: Two times a day (BID) | INTRAVENOUS | Status: DC
  Administered 2019-04-24 – 2019-04-26 (×5): 3 mL via INTRAVENOUS

## 2019-04-24 MED ORDER — HYDROCHLOROTHIAZIDE 25 MG PO TABS
25.0000 mg | ORAL_TABLET | Freq: Every day | ORAL | Status: DC
  Administered 2019-04-25 – 2019-04-30 (×6): 25 mg via ORAL
  Filled 2019-04-24 (×6): qty 1

## 2019-04-24 MED ORDER — SODIUM CHLORIDE 0.9% FLUSH: 3.0000 mL | INTRAVENOUS | Status: DC | PRN

## 2019-04-24 MED ORDER — SODIUM CHLORIDE 0.9 % IV SOLN: 250.0000 mL | INTRAVENOUS | Status: DC | PRN

## 2019-04-24 MED ORDER — BENAZEPRIL HCL 20 MG PO TABS
40.0000 mg | ORAL_TABLET | Freq: Every day | ORAL | Status: DC
  Administered 2019-04-25 – 2019-04-30 (×6): 40 mg via ORAL
  Filled 2019-04-24: qty 1
  Filled 2019-04-24 (×4): qty 2
  Filled 2019-04-24: qty 1
  Filled 2019-04-24: qty 2

## 2019-04-24 MED ORDER — VITAMIN D 25 MCG (1000 UNIT) PO TABS
1000.0000 [IU] | ORAL_TABLET | Freq: Every day | ORAL | Status: DC
  Administered 2019-04-25 – 2019-04-30 (×5): 1000 [IU] via ORAL
  Filled 2019-04-24 (×6): qty 1

## 2019-04-24 MED ORDER — ASPIRIN 81 MG PO CHEW
324.0000 mg | CHEWABLE_TABLET | ORAL | Status: AC
  Administered 2019-04-24: 324 mg via ORAL
  Filled 2019-04-24: qty 4

## 2019-04-24 MED ORDER — HEPARIN BOLUS VIA INFUSION
3700.0000 [IU] | Freq: Once | INTRAVENOUS | Status: AC
  Administered 2019-04-24: 3700 [IU] via INTRAVENOUS
  Filled 2019-04-24: qty 3700

## 2019-04-24 MED ORDER — ASPIRIN EC 81 MG PO TBEC: 81.0000 mg | DELAYED_RELEASE_TABLET | Freq: Every day | ORAL | Status: DC

## 2019-04-24 MED ORDER — ADULT MULTIVITAMIN W/MINERALS CH
1.0000 | ORAL_TABLET | Freq: Every day | ORAL | Status: DC
  Administered 2019-04-25 – 2019-04-30 (×5): 1 via ORAL
  Filled 2019-04-24 (×6): qty 1

## 2019-04-24 MED ORDER — CLOPIDOGREL BISULFATE 75 MG PO TABS
75.0000 mg | ORAL_TABLET | Freq: Every day | ORAL | Status: DC
  Administered 2019-04-25 – 2019-04-29 (×5): 75 mg via ORAL
  Filled 2019-04-24 (×5): qty 1

## 2019-04-24 MED ORDER — ASPIRIN EC 81 MG PO TBEC
81.0000 mg | DELAYED_RELEASE_TABLET | Freq: Every day | ORAL | Status: DC
  Administered 2019-04-25 – 2019-04-28 (×4): 81 mg via ORAL
  Filled 2019-04-24 (×3): qty 1

## 2019-04-24 MED ORDER — ATENOLOL 25 MG PO TABS
100.0000 mg | ORAL_TABLET | Freq: Every day | ORAL | Status: DC
  Administered 2019-04-25 – 2019-04-30 (×6): 100 mg via ORAL
  Filled 2019-04-24 (×7): qty 4

## 2019-04-24 NOTE — Telephone Encounter (Signed)
Patient's daughter is calling because would like Dr. Tamala Julian to be aware that her mother is in the hospital.

## 2019-04-24 NOTE — ED Notes (Signed)
ED TO INPATIENT HANDOFF REPORT  ED Nurse Name and Phone #:  Babs Bertin [phone]  S Name/Age/Gender Claudia Howell 78 y.o. female Room/Bed: 043C/043C  Code Status   Code Status: Full Code  Home/SNF/Other Home Patient oriented to: self, place, time and situation Is this baseline? Yes   Triage Complete: Triage complete  Chief Complaint cp  Triage Note Pt states chest pain since yesterday.  Pain is reduced when she lays down.  Pt took 3 nitro with relief.     Allergies Allergies  Allergen Reactions  . Zetia [Ezetimibe] Other (See Comments)    Muscle cramps  . Oxycodone Nausea And Vomiting and Other (See Comments)    Extreme nausea and vomiting  . Repatha [Evolocumab] Other (See Comments)    Muscle cramps    Level of Care/Admitting Diagnosis ED Disposition    ED Disposition Condition Comment   Admit  Hospital Area: [facility] [[id number]]  Level of Care: Progressive [102]  Admit to Progressive based on following criteria: CARDIOVASCULAR & THORACIC of moderate stability with acute coronary syndrome symptoms/low risk myocardial infarction/hypertensive urgency/arrhythmias/heart failure potentially compromising stability and stable post cardiovascular intervention patients.  Covid Evaluation: Asymptomatic Screening Protocol (No Symptoms)  Diagnosis: [facility]ina [id number]  Admitting Physician: Pixie Casino [phone]  Attending Physician: Pixie Casino [phone]  Estimated length of stay: 3 - 4 days  Certification:: I certify this patient will need inpatient services for at least 2 midnights  PT Class (Do Not Modify): Inpatient [101]  PT Acc Code (Do Not Modify): Private [1]       B Medical/Surgery History Past Medical History:  Diagnosis Date  . Adenomatous colon polyp 10/09  . CHF (congestive heart failure) ([place])   . Coronary artery disease    Last cath 12/14 Cath 12/8 100% SVG occlusion to RCA, 95% SVG.  The stenosis to OM.  LIMA to  LAD patent but diffuse disease in LAD after graft.  Overlapping stents mid/distal SVG to OM.  Marland Kitchen Decubitus ulcer of coccyx 10/22/12   1/2 inch raw open area on coccyx  . Hyperlipidemia   . Hypertension    Dr.Henry Tamala Julian  . Osteopenia 9/09  . PAF (paroxysmal atrial fibrillation) ([place] [place])    during cath/notes 10/16/2012  . S/P CABG x 3 10/22/2012   LIMA to LAD, SVG to distal RCA, SVG to OM, EVH from right thigh  . S/P Maze operation for atrial fibrillation 10/22/2012   Left side lesion set using bipolar radiofrequency and cryothermy ablation with clipping of LA appendage   Past Surgical History:  Procedure Laterality Date  . BREAST CYST EXCISION Left 1980-1990's   x 3  . CARDIAC CATHETERIZATION  10/16/2012  . CATARACT EXTRACTION, BILATERAL Bilateral    03/2015 and 05/2015  . CATARACT EXTRACTION, BILATERAL Bilateral approx 2016  . CORONARY ANGIOPLASTY WITH STENT PLACEMENT  04/01/2013   "1" (06/01/2013)  . CORONARY ARTERY BYPASS GRAFT N/A 10/22/2012   Procedure: CORONARY ARTERY BYPASS GRAFTING (CABG);  Surgeon: Rexene Alberts, MD;  Location: [facility];  Service: Open Heart Surgery;  Laterality: N/A;  . CORONARY STENT INTERVENTION N/A 01/01/2019   Procedure: CORONARY STENT INTERVENTION;  Surgeon: Belva Crome, MD;  Location: [facility];  Service: Cardiovascular;  Laterality: N/A;  . DILATION AND CURETTAGE OF UTERUS    . INTRAOPERATIVE TRANSESOPHAGEAL ECHOCARDIOGRAM N/A 10/22/2012   Procedure: INTRAOPERATIVE TRANSESOPHAGEAL ECHOCARDIOGRAM;  Surgeon: Rexene Alberts, MD;  Location: [facility];  Service: Open Heart Surgery;  Laterality: N/A;  . LEFT HEART CATH AND CORS/GRAFTS ANGIOGRAPHY N/A 01/01/2019   Procedure: LEFT HEART CATH AND CORS/GRAFTS ANGIOGRAPHY;  Surgeon: Belva Crome, MD;  Location: [facility];  Service: Cardiovascular;  Laterality: N/A;  . LEFT HEART CATHETERIZATION WITH CORONARY ANGIOGRAM N/A 10/16/2012   Procedure: LEFT HEART CATHETERIZATION WITH CORONARY ANGIOGRAM;  Surgeon:  Sinclair Grooms, MD;  [facility];  Service: Cardiovascular;  Laterality: N/A;  . LEFT HEART CATHETERIZATION WITH CORONARY/GRAFT ANGIOGRAM N/A 06/01/2013   Procedure: LEFT HEART CATHETERIZATION WITH Beatrix Fetters;  Surgeon: Sinclair Grooms, MD;  Location: [facility] CATH LAB;  Service: Cardiovascular;  Laterality: N/A;  . MAZE N/A 10/22/2012   Procedure: MAZE;  Surgeon: Rexene Alberts, MD;  Location: [facility];  Service: Open Heart Surgery;  Laterality: N/A;  . PERCUTANEOUS CORONARY STENT INTERVENTION (PCI-S)  06/01/2013   Procedure: PERCUTANEOUS CORONARY STENT INTERVENTION (PCI-S);  Surgeon: Sinclair Grooms, MD;  Location: [facility] CATH LAB;  Service: Cardiovascular;;  . TONSILLECTOMY  1949  . TOTAL ABDOMINAL HYSTERECTOMY W/ BILATERAL SALPINGOOPHORECTOMY  1990   benign growth     A IV Location/Drains/Wounds Patient Lines/Drains/Airways Status   Active Line/Drains/Airways    Name:   Placement date:   Placement time:   Site:   Days:   Peripheral IV 04/24/19 Left Antecubital   04/24/19    1045    Antecubital   less than 1   Peripheral IV 04/24/19 Right Antecubital   04/24/19    1500    Antecubital   less than 1          Intake/Output Last 24 hours No intake or output data in the 24 hours ending 04/24/19 1600  Labs/Imaging Results for orders placed or performed during the hospital encounter of 04/24/19 (from the past 48 hour(s))  CBC with Differential     Status: None   Collection Time: 04/24/19 10:23 AM  Result Value Ref Range   WBC 6.6 4.0 - 10.5 K/uL   RBC 4.30 3.87 - 5.11 MIL/uL   Hemoglobin 13.6 12.0 - 15.0 g/dL   HCT 39.9 36.0 - 46.0 %   MCV 92.8 80.0 - 100.0 fL   MCH 31.6 26.0 - 34.0 pg   MCHC 34.1 30.0 - 36.0 g/dL   RDW 11.9 11.5 - 15.5 %   Platelets 205 150 - 400 K/uL   nRBC 0.0 0.0 - 0.2 %   Neutrophils Relative % 58 %   Neutro Abs 3.8 1.7 - 7.7 K/uL   Lymphocytes Relative 27 %   Lymphs Abs 1.8 0.7 - 4.0 K/uL   Monocytes Relative 11 %   Monocytes Absolute  0.7 0.1 - 1.0 K/uL   Eosinophils Relative 3 %   Eosinophils Absolute 0.2 0.0 - 0.5 K/uL   Basophils Relative 1 %   Basophils Absolute 0.1 0.0 - 0.1 K/uL   Immature Granulocytes 0 %   Abs Immature Granulocytes 0.02 0.00 - 0.07 K/uL    Comment: Performed at [facility], [address], [place],  [id number]  Basic metabolic panel     Status: Abnormal   Collection Time: 04/24/19 10:23 AM  Result Value Ref Range   Sodium 133 (L) 135 - 145 mmol/L   Potassium 3.2 (L) 3.5 - 5.1 mmol/L   Chloride 96 (L) 98 - 111 mmol/L   CO2 22 22 - 32 mmol/L   Glucose, Bld 127 (H) 70 - 99 mg/dL   BUN 14 8 - 23  mg/dL   Creatinine, Ser 0.83 0.44 - 1.00 mg/dL   Calcium 9.1 8.9 - 10.3 mg/dL   GFR calc non Af Amer >60 >60 mL/min   GFR calc Af Amer >60 >60 mL/min   Anion gap 15 5 - 15    Comment: Performed at [facility] [address], [place], [place] [address]  Hepatic function panel     Status: None   Collection Time: 04/24/19 10:23 AM  Result Value Ref Range   Total Protein 7.5 6.5 - 8.1 g/dL   Albumin 3.9 3.5 - 5.0 g/dL   AST 21 15 - 41 U/L   ALT 15 0 - 44 U/L   Alkaline Phosphatase 47 38 - 126 U/L   Total Bilirubin 0.8 0.3 - 1.2 mg/dL   Bilirubin, Direct 0.1 0.0 - 0.2 mg/dL   Indirect Bilirubin 0.7 0.3 - 0.9 mg/dL    Comment: Performed at [facility] [address] [place], [place] [address]  Lipase, blood     Status: None   Collection Time: 04/24/19 10:23 AM  Result Value Ref Range   Lipase 40 11 - 51 U/L    Comment: Performed at [facility] [address], [place], [place] [address]  Brain natriuretic peptide     Status: Abnormal   Collection Time: 04/24/19 10:23 AM  Result Value Ref Range   B Natriuretic Peptide 155.8 (H) 0.0 - 100.0 pg/mL    Comment: Performed at [facility] [address], [place], [place] [address]  Troponin I (High Sensitivity)     Status: None   Collection Time: 04/24/19 10:23 AM  Result Value Ref Range   Troponin I (High  Sensitivity) 10 <18 ng/L    Comment: (NOTE) Elevated high sensitivity troponin I (hsTnI) values and significant  changes across serial measurements may suggest ACS but many other  chronic and acute conditions are known to elevate hsTnI results.  Refer to the "Links" section for chest pain algorithms and additional  guidance. Performed at [facility], [place] [address], [place], [place] [address]   Troponin I (High Sensitivity)     Status: Abnormal   Collection Time: 04/24/19 12:05 PM  Result Value Ref Range   Troponin I (High Sensitivity) 75 (H) <18 ng/L    Comment: DELTA CHECK NOTED RESULT CALLED TO, READ BACK BY AND VERIFIED WITH: B MICHAELSON RN [phone] [id number] BY A BENNETT (NOTE) Elevated high sensitivity troponin I (hsTnI) values and significant  changes across serial measurements may suggest ACS but many other  chronic and acute conditions are known to elevate hsTnI results.  Refer to the Links section for chest pain algorithms and additional  guidance. Performed at [facility], [place] [address], [place], [place] [address] CORRECTED ON 10/30 AT [id number]: PREVIOUSLY REPORTED AS 75 DELTA CHECK NOTED CRITICAL RESULT CALLED TO, READ BACK BY AND VERIFIED WITHLaurance Flatten RN [id number] BY A BENNETT    Dg Chest Portable 1 View  Result Date: 04/24/2019 CLINICAL DATA:  Chest pain since yesterday EXAM: PORTABLE CHEST 1 VIEW COMPARISON:  June 03, 2015 FINDINGS: Interstitial prominence, likely chronic. No focal consolidation. No pleural effusion or pneumothorax. Normal heart size with evidence of prior cardiac surgery. Included osseous structures are unremarkable. IMPRESSION: No active disease. Electronically Signed   By: Macy Mis M.D.   On: 04/24/2019 11:11    Pending Labs [employer] (From admission, onward)    Start  Ordered   04/25/19 0500  Lipid panel  Tomorrow morning,   R     04/24/19 1420   04/25/19 [id number]  Basic metabolic panel  Daily,   R     04/24/19  1420   04/25/19 0500  CBC  Daily,   R     04/24/19 1438   04/25/19 0500  Heparin level (unfractionated)  Daily,   R     04/24/19 1438   04/24/19 2300  Heparin level (unfractionated)  Once-Timed,   STAT     04/24/19 1438   04/24/19 1436  SARS Coronavirus 2 by RT PCR (hospital order, performed in [facility] hospital lab) Nasopharyngeal Nasopharyngeal Swab  (Symptomatic/High Risk of Exposure/Tier 1 Patients Labs with Precautions)  Once,   STAT    Question Answer Comment  Is this test for diagnosis or screening Screening   Symptomatic for COVID-19 as defined by CDC No   Hospitalized for COVID-19 No   Admitted to ICU for COVID-19 No   Previously tested for COVID-19 Yes   Resident in a congregate (group) care setting No   Employed in healthcare setting No   Pregnant No      04/24/19 1435          Vitals/Pain Today's Vitals   04/24/19 1300 04/24/19 1330 04/24/19 1400 04/24/19 1407  BP: (!) 141/120 (!) 168/83 (!) 160/145   Pulse: 75 79 91   Resp: 18 (!) 22 (!) 25   Temp:      SpO2: 97% 99% 99%   Weight:      Height:      PainSc:    0-No pain    Isolation Precautions No active isolations  Medications Medications  nitroGLYCERIN (NITROSTAT) SL tablet 0.4 mg (has no administration in time range)  atenolol (TENORMIN) tablet 100 mg (has no administration in time range)  atorvastatin (LIPITOR) tablet 10 mg (has no administration in time range)  benazepril (LOTENSIN) tablet 40 mg (has no administration in time range)  hydrochlorothiazide (HYDRODIURIL) tablet 25 mg (has no administration in time range)  cholecalciferol (VITAMIN D3) tablet 1,000 Units (has no administration in time range)  multivitamin with minerals tablet 1 tablet (has no administration in time range)  aspirin chewable tablet 324 mg (has no administration in time range)    Or  aspirin suppository 300 mg (has no administration in time range)  aspirin EC tablet 81 mg (has no administration in time range)   acetaminophen (TYLENOL) tablet 650 mg (has no administration in time range)  ondansetron (ZOFRAN) injection 4 mg (has no administration in time range)  sodium chloride flush (NS) 0.9 % injection 3 mL (has no administration in time range)  sodium chloride flush (NS) 0.9 % injection 3 mL (has no administration in time range)  0.9 %  sodium chloride infusion (has no administration in time range)  clopidogrel (PLAVIX) tablet 300 mg (has no administration in time range)  clopidogrel (PLAVIX) tablet 75 mg (has no administration in time range)  nitroGLYCERIN 50 mg in dextrose 5 % 250 mL (0.2 mg/mL) infusion (5 mcg/min Intravenous New Bag/Given 04/24/19 1547)  heparin ADULT infusion 100 units/mL (25000 units/243mL sodium chloride 0.45%) (700 Units/hr Intravenous New Bag/Given 04/24/19 1543)  fentaNYL (SUBLIMAZE) injection 50 mcg (50 mcg Intravenous Given 04/24/19 1038)  heparin bolus via infusion 3,700 Units (3,700 Units Intravenous Bolus from Bag 04/24/19 1541)    Mobility walks Low fall risk   Focused Assessments Cardiac Assessment Handoff:  Cardiac Rhythm: Normal sinus rhythm Lab  Results  Component Value Date   TROPONINI <0.03 06/04/2015   No results found for: DDIMER Does the Patient currently have chest pain? No     R Recommendations: See Admitting Provider Note  Report given to:   Additional Notes:

## 2019-04-24 NOTE — ED Provider Notes (Addendum)
[facility] EMERGENCY DEPARTMENT Provider Note   CSN: [id number] Arrival date & time: 04/24/19  1003     History   Chief Complaint Chief Complaint  Patient presents with  . Chest Pain    HPI Claudia Howell is a 78 y.o. female.     The history is provided by the patient.  Chest Pain Pain location:  Substernal area Pain quality: pressure   Pain radiates to:  R arm Pain severity:  Moderate Onset quality:  Gradual Timing:  Constant Progression:  Worsening Chronicity:  Recurrent Context: at rest   Relieved by:  Nothing Worsened by:  Nothing Ineffective treatments:  Nitroglycerin Associated symptoms: no abdominal pain, no back pain, no cough, no fever, no palpitations, no shortness of breath and no vomiting   Risk factors: coronary artery disease, high cholesterol and hypertension     Past Medical History:  Diagnosis Date  . Adenomatous colon polyp 10/09  . CHF (congestive heart failure) ([place])   . Coronary artery disease    Last cath 12/14 Cath 12/8 100% SVG occlusion to RCA, 95% SVG.  The stenosis to OM.  LIMA to LAD patent but diffuse disease in LAD after graft.  Overlapping stents mid/distal SVG to OM.  Marland Kitchen Decubitus ulcer of coccyx 10/22/12   1/2 inch raw open area on coccyx  . Hyperlipidemia   . Hypertension    Dr.Henry Tamala Julian  . Osteopenia 9/09  . PAF (paroxysmal atrial fibrillation) ([place])    during cath/notes 10/16/2012  . S/P CABG x 3 10/22/2012   LIMA to LAD, SVG to distal RCA, SVG to OM, EVH from right thigh  . S/P Maze operation for atrial fibrillation 10/22/2012   Left side lesion set using bipolar radiofrequency and cryothermy ablation with clipping of LA appendage    Patient Active Problem List   Diagnosis Date Noted  . Pulmonary edema 06/05/2015  . Chronic diastolic heart failure ([place]) 06/03/2015  . Advance care planning 09/09/2014  . Carotid artery obstruction 03/09/2014  . CAD (coronary artery disease), autologous vein bypass graft  06/02/2013  . Angina, class III ([place]) 06/01/2013  . Hyperlipidemia 05/29/2013  . Atrial fibrillation ([place]) 03/26/2013  . S/P CABG x 3 10/22/2012  . S/P Maze operation for atrial fibrillation 10/22/2012  . Abnormal nuclear stress test 10/16/2012    Class: Acute  . Pure hypercholesterolemia 10/06/2012  . Essential hypertension, benign 10/06/2012    Past Surgical History:  Procedure Laterality Date  . BREAST CYST EXCISION Left 1980-1990's   x 3  . CARDIAC CATHETERIZATION  10/16/2012  . CATARACT EXTRACTION, BILATERAL Bilateral    03/2015 and 05/2015  . CATARACT EXTRACTION, BILATERAL Bilateral approx 2016  . CORONARY ANGIOPLASTY WITH STENT PLACEMENT  04/01/2013   "1" (06/01/2013)  . CORONARY ARTERY BYPASS GRAFT N/A 10/22/2012   Procedure: CORONARY ARTERY BYPASS GRAFTING (CABG);  Surgeon: Rexene Alberts, MD;  Location: [facility];  Service: Open Heart Surgery;  Laterality: N/A;  . CORONARY STENT INTERVENTION N/A 01/01/2019   Procedure: CORONARY STENT INTERVENTION;  Surgeon: Belva Crome, MD;  Location: [place] CV LAB;  Service: Cardiovascular;  Laterality: N/A;  . DILATION AND CURETTAGE OF UTERUS    . INTRAOPERATIVE TRANSESOPHAGEAL ECHOCARDIOGRAM N/A 10/22/2012   Procedure: INTRAOPERATIVE TRANSESOPHAGEAL ECHOCARDIOGRAM;  Surgeon: Rexene Alberts, MD;  Location: [facility];  Service: Open Heart Surgery;  Laterality: N/A;  . LEFT HEART CATH AND CORS/GRAFTS ANGIOGRAPHY N/A 01/01/2019   Procedure: LEFT HEART CATH AND CORS/GRAFTS ANGIOGRAPHY;  Surgeon: Tamala Julian,  Lynnell Dike, MD;  Location: [facility] CV LAB;  Service: Cardiovascular;  Laterality: N/A;  . LEFT HEART CATHETERIZATION WITH CORONARY ANGIOGRAM N/A 10/16/2012   Procedure: LEFT HEART CATHETERIZATION WITH CORONARY ANGIOGRAM;  Surgeon: Sinclair Grooms, MD;  Location: [facility] C[facility] LAB;  Service: Cardiovascular;  Laterality: N/A;  . LEFT HEART CATHETERIZATION WITH CORONARY/GRAFT ANGIOGRAM N/A 06/01/2013   Procedure: LEFT HEART CATHETERIZATION WITH  Beatrix Fetters;  Surgeon: Sinclair Grooms, MD;  Location: [facility] CATH LAB;  Service: Cardiovascular;  Laterality: N/A;  . MAZE N/A 10/22/2012   Procedure: MAZE;  Surgeon: Rexene Alberts, MD;  Location: [facility];  Service: Open Heart Surgery;  Laterality: N/A;  . PERCUTANEOUS CORONARY STENT INTERVENTION (PCI-S)  06/01/2013   Procedure: PERCUTANEOUS CORONARY STENT INTERVENTION (PCI-S);  Surgeon: Sinclair Grooms, MD;  Location: [facility] CATH LAB;  Service: Cardiovascular;;  . TONSILLECTOMY  1949  . TOTAL ABDOMINAL HYSTERECTOMY W/ BILATERAL SALPINGOOPHORECTOMY  1990   benign growth     OB History    Gravida  3   Para  2   Term      Preterm      AB  1   Living  2     SAB  1   TAB      Ectopic      Multiple      Live Births               Home Medications    Prior to Admission medications   Medication Sig Start Date End Date Taking? Authorizing Provider  aspirin EC 81 MG tablet Take 1 tablet (81 mg total) by mouth daily. 05/04/13  Yes Rexene Alberts, MD  atenolol (TENORMIN) 100 MG tablet TAKE 1 TABLET(100 MG) BY MOUTH DAILY Patient taking differently: Take 100 mg by mouth daily.  09/29/18  Yes Belva Crome, MD  atorvastatin (LIPITOR) 10 MG tablet TAKE 1 TABLET BY MOUTH DAILY Patient taking differently: Take 10 mg by mouth daily.  10/31/18  Yes Belva Crome, MD  benazepril (LOTENSIN) 40 MG tablet TAKE 1 TABLET(40 MG) BY MOUTH DAILY Patient taking differently: Take 40 mg by mouth daily.  08/25/18  Yes Belva Crome, MD  cholecalciferol (VITAMIN D) 1000 UNITS tablet Take 1,000 Units by mouth daily.   Yes [provider]  furosemide (LASIX) 20 MG tablet Take 1 tablet (20 mg total) by mouth as needed for fluid or edema. Reported on 12/19/2015 09/05/16  Yes Belva Crome, MD  GLUCOSAMINE-CHONDROITIN-VIT D3 PO Take 1 tablet by mouth daily.    Yes [provider]  hydrochlorothiazide (HYDRODIURIL) 25 MG tablet TAKE 1 TABLET(25 MG) BY MOUTH DAILY Patient  taking differently: Take 25 mg by mouth daily.  10/30/18  Yes Belva Crome, MD  isosorbide mononitrate (IMDUR) 60 MG 24 hr tablet Take 1 tablet (60 mg total) by mouth daily. 01/19/19 04/24/19 Yes Belva Crome, MD  Multiple Vitamin (MULTIVITAMIN) tablet Take 1 tablet by mouth daily.   Yes [provider]  nitroGLYCERIN (NITROSTAT) 0.4 MG SL tablet Place 1 tablet (0.4 mg total) under the tongue every 5 (five) minutes as needed for chest pain. 08/20/18  Yes Belva Crome, MD  potassium chloride (K-DUR) 10 MEQ tablet Take 10 mEq by mouth every 3 (three) days.    Yes [provider]    Family History Family History  Problem Relation Age of Onset  . Hypertension Mother   . Heart attack Mother 51  .  Hypertension Brother   . Cancer Brother        prostate  . Heart disease Brother 29       stent  . Heart disease Other        x2 (age 51 and 40)  . Diabetes Neg Hx   . Breast cancer Neg Hx   . Colon cancer Neg Hx     Social History Social History   Tobacco Use  . Smoking status: Never Smoker  . Smokeless tobacco: Never Used  Substance Use Topics  . Alcohol use: Yes    Alcohol/week: 1.0 standard drinks    Types: 1 Glasses of wine per week    Comment: 1 glass of wine 1 times/week  . Drug use: No     Allergies   Zetia [ezetimibe], Oxycodone, and Repatha [evolocumab]   Review of Systems Review of Systems  Constitutional: Negative for chills and fever.  HENT: Negative for ear pain and sore throat.   Eyes: Negative for pain and visual disturbance.  Respiratory: Negative for cough and shortness of breath.   Cardiovascular: Positive for chest pain. Negative for palpitations.  Gastrointestinal: Negative for abdominal pain and vomiting.  Genitourinary: Negative for dysuria and hematuria.  Musculoskeletal: Negative for arthralgias and back pain.  Skin: Negative for color change and rash.  Neurological: Negative for seizures and syncope.  All other systems reviewed  and are negative.    Physical Exam Updated Vital Signs  ED Triage Vitals  Enc Vitals Group     BP 04/24/19 1016 (!) 211/120     Pulse Rate 04/24/19 1016 99     Resp 04/24/19 1016 (!) 24     Temp --      Temp src --      SpO2 04/24/19 1016 97 %     Weight 04/24/19 1017 135 lb (61.2 kg)     Height 04/24/19 1017 5\' 5"  (1.651 m)     Head Circumference --      Peak Flow --      Pain Score 04/24/19 1017 8     Pain Loc --      Pain Edu? --      Excl. in [place]? --     Physical Exam Vitals signs and nursing note reviewed.  Constitutional:      General: She is in acute distress.     Appearance: She is well-developed. She is ill-appearing.  HENT:     Head: Normocephalic and atraumatic.  Eyes:     Extraocular Movements: Extraocular movements intact.     Conjunctiva/sclera: Conjunctivae normal.     Pupils: Pupils are equal, round, and reactive to light.  Neck:     Musculoskeletal: Normal range of motion and neck supple.  Cardiovascular:     Rate and Rhythm: Normal rate and regular rhythm.     Pulses:          Radial pulses are 2+ on the right side and 2+ on the left side.     Heart sounds: Normal heart sounds. No murmur.  Pulmonary:     Effort: Pulmonary effort is normal. No respiratory distress.     Breath sounds: Normal breath sounds. No decreased breath sounds, wheezing or rhonchi.  Abdominal:     Palpations: Abdomen is soft.     Tenderness: There is no abdominal tenderness.  Musculoskeletal: Normal range of motion.     Right lower leg: No edema.     Left lower leg: No edema.  Skin:  General: Skin is warm and dry.     Capillary Refill: Capillary refill takes less than 2 seconds.  Neurological:     General: No focal deficit present.     Mental Status: She is alert.  Psychiatric:        Mood and Affect: Mood normal.      ED Treatments / Results  Labs (all labs ordered are listed, but only abnormal results are displayed) Labs Reviewed  BASIC METABOLIC PANEL -  Abnormal; Notable for the following components:      Result Value   Sodium 133 (*)    Potassium 3.2 (*)    Chloride 96 (*)    Glucose, Bld 127 (*)    All other components within normal limits  BRAIN NATRIURETIC PEPTIDE - Abnormal; Notable for the following components:   B Natriuretic Peptide 155.8 (*)    All other components within normal limits  TROPONIN I (HIGH SENSITIVITY) - Abnormal; Notable for the following components:   Troponin I (High Sensitivity) 75 (*)    All other components within normal limits  CBC WITH DIFFERENTIAL/PLATELET  HEPATIC FUNCTION PANEL  LIPASE, BLOOD  TROPONIN I (HIGH SENSITIVITY)    EKG EKG Interpretation  Date/Time:  Friday April 24 2019 10:12:17 EDT Ventricular Rate:  100 PR Interval:    QRS Duration: 110 QT Interval:  347 QTC Calculation: 448 R Axis:   88 Text Interpretation: Sinus tachycardia Atrial premature complexes in couplets Anterior infarct, old Borderline ST depression, lateral leads Confirmed by Lennice Sites [phone]) on 04/24/2019 10:18:03 AM   Radiology Dg Chest Portable 1 View  Result Date: 04/24/2019 CLINICAL DATA:  Chest pain since yesterday EXAM: PORTABLE CHEST 1 VIEW COMPARISON:  June 03, 2015 FINDINGS: Interstitial prominence, likely chronic. No focal consolidation. No pleural effusion or pneumothorax. Normal heart size with evidence of prior cardiac surgery. Included osseous structures are unremarkable. IMPRESSION: No active disease. Electronically Signed   By: Macy Mis M.D.   On: 04/24/2019 11:11    Procedures .Critical Care Performed by: Lennice Sites, DO Authorized by: Lennice Sites, DO   Critical care provider statement:    Critical care time (minutes):  40   Critical care was necessary to treat or prevent imminent or life-threatening deterioration of the following conditions:  Cardiac failure   Critical care was time spent personally by me on the following activities:  Blood draw for specimens,  development of treatment plan with patient or surrogate, discussions with primary provider, evaluation of patient's response to treatment, examination of patient, obtaining history from patient or surrogate, ordering and performing treatments and interventions, ordering and review of laboratory studies, ordering and review of radiographic studies, pulse oximetry, re-evaluation of patient's condition and review of old charts   I assumed direction of critical care for this patient from another provider in my specialty: no     (including critical care time)  Medications Ordered in ED Medications  nitroGLYCERIN (NITROSTAT) SL tablet 0.4 mg (has no administration in time range)  fentaNYL (SUBLIMAZE) injection 50 mcg (50 mcg Intravenous Given 04/24/19 1038)     Initial Impression / Assessment and Plan / ED Course  I have reviewed the triage vital signs and the nursing notes.  Pertinent labs & imaging results that were available during my care of the patient were reviewed by me and considered in my medical decision making (see chart for details).   Claudia Howell is a 79 year old female with extensive cardiac history with history of bypass and multiple stents  with recent catheterization showing multivessel disease medically managed who presents the ED with chest pain.  Patient hypertensive 211/120 but otherwise unremarkable vitals.  Has taken all of her home medications this morning.  Has chronic angina but worse this morning and more severe than normal.  Did not have any improvement with her home nitroglycerin.  Pressure-like feeling with radiation to the right arm.  EKG shows sinus rhythm.  No ischemic changes that are new from prior EKGs.  Will get cardiac work-up.  Doubt PE.  No signs of volume overload on exam.  Will likely consult cardiology for further recommendations.  Will obtain chest x-ray.  Will give nitroglycerin and fentanyl for pain and reevaluate.  Blood pressure has improved.  Chest  x-ray shows no signs of pneumonia, no pneumothorax.  Troponin normal.  BNP mildly elevated at 155.  No significant anemia, electrolyte abnormality otherwise.  Cardiology consulted as previous heart cath several months ago recommended possibly CTO.  Anticipate admission.  Awaiting final cardiology recommendations.  Repeat troponin is elevated.  It appears a cardiology will admit for ongoing anginal pain.  Recommend then IV heparin.  Cardiology confirms that we will start IV heparin and nitroglycerin infusion for blood pressure and pain control.  Possibly heart catheterization today.  This chart was dictated using voice recognition software.  Despite best efforts to proofread,  errors can occur which can change the documentation meaning.    Final Clinical Impressions(s) / ED Diagnoses   Final diagnoses:  Elevated troponin  Unstable angina [facility])    ED Discharge Orders    None       Lennice Sites, DO 04/24/19 [place], [place], DO 04/24/19 1446

## 2019-04-24 NOTE — ED Notes (Signed)
Report given to stephanie RN.

## 2019-04-24 NOTE — Progress Notes (Signed)
ANTICOAGULATION CONSULT NOTE - Initial Consult  Pharmacy Consult for Heparin Indication: chest pain/ACS  Allergies  Allergen Reactions  . Zetia [Ezetimibe] Other (See Comments)    Muscle cramps  . Oxycodone Nausea And Vomiting and Other (See Comments)    Extreme nausea and vomiting  . Repatha [Evolocumab] Other (See Comments)    Muscle cramps    Patient Measurements: Height: 5\' 5"  (165.1 cm) Weight: 135 lb (61.2 kg) IBW/kg (Calculated) : 57 Heparin Dosing Weight: 61.2  Vital Signs: Temp: 97.8 F (36.6 C) (10/30 1022) BP: 160/145 (10/30 1400) Pulse Rate: 91 (10/30 1400)  Labs: Recent Labs    04/24/19 1023 04/24/19 1205  HGB 13.6  --   HCT 39.9  --   PLT 205  --   CREATININE 0.83  --   TROPONINIHS 10 75*    Estimated Creatinine Clearance: 51.9 mL/min (by C-G formula based on SCr of 0.83 mg/dL).   Medical History: Past Medical History:  Diagnosis Date  . Adenomatous colon polyp 10/09  . CHF (congestive heart failure) ([place])   . Coronary artery disease    Last cath 12/14 Cath 12/8 100% SVG occlusion to RCA, 95% SVG.  The stenosis to OM.  LIMA to LAD patent but diffuse disease in LAD after graft.  Overlapping stents mid/distal SVG to OM.  Marland Kitchen Decubitus ulcer of coccyx 10/22/12   1/2 inch raw open area on coccyx  . Hyperlipidemia   . Hypertension    Dr.Henry Tamala Julian  . Osteopenia 9/09  . PAF (paroxysmal atrial fibrillation) ([place])    during cath/notes 10/16/2012  . S/P CABG x 3 10/22/2012   LIMA to LAD, SVG to distal RCA, SVG to OM, EVH from right thigh  . S/P Maze operation for atrial fibrillation 10/22/2012   Left side lesion set using bipolar radiofrequency and cryothermy ablation with clipping of LA appendage    Medications:  Scheduled:  . aspirin  324 mg Oral NOW   Or  . aspirin  300 mg Rectal NOW  . aspirin EC  81 mg Oral Daily  . [START ON 04/25/2019] aspirin EC  81 mg Oral Daily  . [START ON 04/25/2019] atenolol  100 mg Oral Daily  . atorvastatin  10 mg  Oral Daily  . [START ON 04/25/2019] benazepril  40 mg Oral Daily  . clopidogrel  300 mg Oral Once  . [START ON 04/25/2019] clopidogrel  75 mg Oral Q breakfast  . [START ON 04/25/2019] hydrochlorothiazide  25 mg Oral Daily  . multivitamin  1 tablet Oral Daily  . sodium chloride flush  3 mL Intravenous Q12H  . cholecalciferol  1,000 Units Oral Daily   Infusions:  . sodium chloride    . nitroGLYCERIN      Assessment: Pt is a 78 y/o female with PMH of CAD s/p CABG 2014 with maze procedure, SVGstent 2014, HTN, systoic and diastolic heart failure, and paroxysmal atrial fibrillation. Patient has been having angina symptoms for some time now and presents with a chief complaint of chest pain. Pharmacy has been consulted to dose heparin.  Pt not on anticoagulation PTA.  Goal of Therapy:  Heparin level 0.3-0.7 units/ml Monitor platelets by anticoagulation protocol: Yes   Plan:  Give 3700 units bolus x 1 Start heparin infusion at 700 units/hr Check anti-Xa level in 8 hours and daily while on heparin Continue to monitor H&H and platelets  Monitor signs/symptoms of bleeding  Sherren Kerns, PharmD PGY1 Acute Care Pharmacy Resident 04/24/2019,2:27 PM
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
## 2019-04-24 NOTE — ED Triage Notes (Signed)
Pt states chest pain since yesterday.  Pain is reduced when she lays down.  Pt took 3 nitro with relief.

## 2019-04-24 NOTE — Progress Notes (Signed)
ANTICOAGULATION CONSULT NOTE   Pharmacy Consult for Heparin Indication: chest pain/ACS  Patient Measurements: Height: 5\' 7"  (170.2 cm)(per patient report) Weight: 132 lb 14.4 oz (60.3 kg) IBW/kg (Calculated) : 61.6 Heparin Dosing Weight: 61.2  Vital Signs: Temp: 98.7 F (37.1 C) (10/30 1951) Temp Source: Oral (10/30 1951) BP: 119/72 (10/30 1951) Pulse Rate: 98 (10/30 1951)  Labs: Recent Labs    04/24/19 1023 04/24/19 1205 04/24/19 1450 04/24/19 1701 04/24/19 2237  HGB 13.6  --   --   --   --   HCT 39.9  --   --   --   --   PLT 205  --   --   --   --   HEPARINUNFRC  --   --   --   --  0.19*  CREATININE 0.83  --   --   --   --   TROPONINIHS 10 75* 383* 744*  --     Estimated Creatinine Clearance: 54.9 mL/min (by C-G formula based on SCr of 0.83 mg/dL).   Assessment: Pt is a 78 y/o female with PMH of CAD s/p CABG 2014 with maze procedure, SVGstent 2014, HTN, systoic and diastolic heart failure, and paroxysmal atrial fibrillation. Patient has been having angina symptoms for some time now and presents with a chief complaint of chest pain. Pharmacy has been consulted to dose heparin.  Initial heparin level 0.19 units/ml  Goal of Therapy:  Heparin level 0.3-0.7 units/ml Monitor platelets by anticoagulation protocol: Yes   Plan:  Give 1000 units bolus x 1 Increase heparin infusion to 900 units/hr Check anti-Xa level in 8 hours and daily while on heparin Continue to monitor H&H and platelets  Monitor signs/symptoms of bleeding  Thanks for allowing pharmacy to be a part of this patient's care.  Excell Seltzer, PharmD Clinical Pharmacist 04/24/2019,11:12 PM

## 2019-04-24 NOTE — ED Notes (Signed)
Family at bedside. 

## 2019-04-25 ENCOUNTER — Other Ambulatory Visit (HOSPITAL_COMMUNITY): Payer: Medicare Other

## 2019-04-25 DIAGNOSIS — I200 Unstable angina: Secondary | ICD-10-CM | POA: Diagnosis not present

## 2019-04-25 LAB — BASIC METABOLIC PANEL
Anion gap: 16 — ABNORMAL HIGH (ref 5–15)
BUN: 16 mg/dL (ref 8–23)
CO2: 21 mmol/L — ABNORMAL LOW (ref 22–32)
Calcium: 9.4 mg/dL (ref 8.9–10.3)
Chloride: 97 mmol/L — ABNORMAL LOW (ref 98–111)
Creatinine, Ser: 0.95 mg/dL (ref 0.44–1.00)
GFR calc Af Amer: >60 mL/min (ref >60)
GFR calc non Af Amer: 58 mL/min — ABNORMAL LOW (ref >60)
Glucose, Bld: 128 mg/dL — ABNORMAL HIGH (ref 70–99)
Potassium: 3.7 mmol/L (ref 3.5–5.1)
Sodium: 134 mmol/L — ABNORMAL LOW (ref 135–145)

## 2019-04-25 LAB — LIPID PANEL
Cholesterol: 168 mg/dL (ref 0–200)
HDL: 57 mg/dL (ref >40)
LDL Cholesterol: 90 mg/dL (ref 0–99)
Total CHOL/HDL Ratio: 2.9 RATIO
Triglycerides: 106 mg/dL (ref <150)
VLDL: 21 mg/dL (ref 0–40)

## 2019-04-25 LAB — HEPARIN LEVEL (UNFRACTIONATED)
Heparin Unfractionated: 0.37 IU/mL (ref 0.30–0.70)
Heparin Unfractionated: 0.43 IU/mL (ref 0.30–0.70)

## 2019-04-25 LAB — CBC
HCT: 41.5 % (ref 36.0–46.0)
Hemoglobin: 14.6 g/dL (ref 12.0–15.0)
MCH: 31.8 pg (ref 26.0–34.0)
MCHC: 35.2 g/dL (ref 30.0–36.0)
MCV: 90.4 fL (ref 80.0–100.0)
Platelets: 190 10*3/uL (ref 150–400)
RBC: 4.59 MIL/uL (ref 3.87–5.11)
RDW: 11.9 % (ref 11.5–15.5)
WBC: 11.9 10*3/uL — ABNORMAL HIGH (ref 4.0–10.5)
nRBC: 0 % (ref 0.0–0.2)

## 2019-04-25 MED ORDER — ROSUVASTATIN CALCIUM 5 MG PO TABS
10.0000 mg | ORAL_TABLET | Freq: Every day | ORAL | Status: DC
  Administered 2019-04-25 – 2019-04-29 (×5): 10 mg via ORAL
  Filled 2019-04-25 (×5): qty 2

## 2019-04-25 MED ORDER — SENNA 8.6 MG PO TABS
2.0000 | ORAL_TABLET | Freq: Every day | ORAL | Status: DC | PRN
  Administered 2019-04-25: 17.2 mg via ORAL
  Filled 2019-04-25: qty 2

## 2019-04-25 NOTE — Progress Notes (Signed)
ANTICOAGULATION CONSULT NOTE   Pharmacy Consult for Heparin Indication: chest pain/ACS  Patient Measurements: Height: 5\' 7"  (170.2 cm)(per patient report) Weight: 134 lb 3.2 oz (60.9 kg) IBW/kg (Calculated) : 61.6 Heparin Dosing Weight: 61.2  Vital Signs: Temp: 98.5 F (36.9 C) (10/31 0406) Temp Source: Oral (10/31 0406) BP: 141/85 (10/31 0406) Pulse Rate: 104 (10/31 0406)  Labs: Recent Labs    04/24/19 1023 04/24/19 1205 04/24/19 1450 04/24/19 1701 04/24/19 2237 04/25/19 0819  HGB 13.6  --   --   --   --  14.6  HCT 39.9  --   --   --   --  41.5  PLT 205  --   --   --   --  190  HEPARINUNFRC  --   --   --   --  0.19* 0.37  CREATININE 0.83  --   --   --   --  0.95  TROPONINIHS 10 75* 383* 744*  --   --     Estimated Creatinine Clearance: 48.4 mL/min (by C-G formula based on SCr of 0.95 mg/dL).   Assessment: Pt is a 78 y/o female with PMH of CAD s/p CABG 2014 with maze procedure, SVGstent 2014, HTN, systoic and diastolic heart failure, and paroxysmal atrial fibrillation. Patient has been having angina symptoms for some time now and presents with a chief complaint of chest pain. Pharmacy has been consulted to dose heparin.  Heparin level is therapeutic at 0.37 units/ml on heparin rate of 900. CBC is wnls at 14.6/41.5. Plts wnls. No issues with bleeding or infusion per RN  Goal of Therapy:  Heparin level 0.3-0.7 units/ml Monitor platelets by anticoagulation protocol: Yes   Plan:  Continue heparin infusion to 900 units/hr Check confirmatory anti-Xa level in 8 hours and daily while on heparin Continue to monitor H&H and platelets  Monitor signs/symptoms of bleeding  Thanks for allowing pharmacy to be a part of this patient's care.  Sherren Kerns, PharmD PGY1 Acute Care Pharmacy Resident 04/25/2019,10:10 AM

## 2019-04-25 NOTE — Progress Notes (Signed)
ANTICOAGULATION CONSULT NOTE  Pharmacy Consult for Heparin Indication: chest pain/ACS  Patient Measurements: Height: 5\' 7"  (170.2 cm)(per patient report) Weight: 134 lb 3.2 oz (60.9 kg) IBW/kg (Calculated) : 61.6 Heparin Dosing Weight: 61 kg  Vital Signs: Temp: 98.8 F (37.1 C) (10/31 1638) Temp Source: Oral (10/31 1638) BP: 146/64 (10/31 1638)  Labs: Recent Labs    04/24/19 1023 04/24/19 1205 04/24/19 1450 04/24/19 1701 04/24/19 2237 04/25/19 0819 04/25/19 1604  HGB 13.6  --   --   --   --  14.6  --   HCT 39.9  --   --   --   --  41.5  --   PLT 205  --   --   --   --  190  --   HEPARINUNFRC  --   --   --   --  0.19* 0.37 0.43  CREATININE 0.83  --   --   --   --  0.95  --   TROPONINIHS 10 75* 383* 744*  --   --   --     Estimated Creatinine Clearance: 48.4 mL/min (by C-G formula based on SCr of 0.95 mg/dL).   Assessment: 78 y/o female with PMH of CAD s/p CABG 2014 with maze procedure, SVGstent 2014, HTN, systoic and diastolic heart failure, and paroxysmal atrial fibrillation. Patient has been having angina symptoms for some time now and presents with a chief complaint of chest pain. Pharmacy has been consulted to dose heparin.  Confirmatory heparin level is therapeutic; no bleeding reported.  Goal of Therapy:  Heparin level 0.3-0.7 units/ml Monitor platelets by anticoagulation protocol: Yes   Plan:  Continue heparin gtt at 900 units/hr F/U AM labs  Talynn Lebon D. Mina Marble, PharmD, BCPS, [place] 04/25/2019, 5:05 PM

## 2019-04-25 NOTE — Progress Notes (Signed)
Progress Note  Patient Name: Sondra Barges Date of Encounter: 04/25/2019  Primary Cardiologist: Sinclair Grooms, MD   Subjective   78 year old female with a history of coronary artery disease, coronary artery bypass grafting.  She is status post Maze procedure.  She is status post PCI to the saphenous vein graft in 2014.  She has a history of paroxysmal atrial fibrillation.  She has been having crescendo angina for several months.  She had an angiogram in July which revealed an occluded SVG to RCA.  Dr. Debara Pickett discussed the possibility of CTO PCI with Dr. Emeterio Reeve yesterday and that is tentatively scheduled for Wednesday.  Her only complaint is that she was awake much of the night due to drawing blood for heparin levels.  Having a discussion with the pharmacist regarding changing her to Lovenox therapy to eliminate the blood draws.  She is not having any episodes of chest discomfort.   Inpatient Medications    Scheduled Meds: . aspirin EC  81 mg Oral Daily  . atenolol  100 mg Oral Daily  . atorvastatin  10 mg Oral Daily  . benazepril  40 mg Oral Daily  . cholecalciferol  1,000 Units Oral Daily  . clopidogrel  75 mg Oral Q breakfast  . hydrochlorothiazide  25 mg Oral Daily  . multivitamin with minerals  1 tablet Oral Daily  . sodium chloride flush  3 mL Intravenous Q12H   Continuous Infusions: . sodium chloride    . heparin 900 Units/hr (04/24/19 2341)  . nitroGLYCERIN 5 mcg/min (04/24/19 1551)   PRN Meds: sodium chloride, acetaminophen, nitroGLYCERIN, ondansetron (ZOFRAN) IV, sodium chloride flush   Vital Signs    Vitals:   04/24/19 1600 04/24/19 1708 04/24/19 1951 04/25/19 0406  BP: (!) 168/91 (!) 177/87 119/72 (!) 141/85  Pulse: 76  98 (!) 104  Resp: 15 19 17 16   Temp:  98.1 F (36.7 C) 98.7 F (37.1 C) 98.5 F (36.9 C)  TempSrc:  Oral Oral Oral  SpO2: 98% 99% 95% 98%  Weight:  60.3 kg  60.9 kg  Height:  5\' 7"  (1.702 m)      Intake/Output Summary (Last  24 hours) at 04/25/2019 1027 Last data filed at 04/24/2019 2000 Gross per 24 hour  Intake 285.03 ml  Output -  Net 285.03 ml   Last 3 Weights 04/25/2019 04/24/2019 04/24/2019  Weight (lbs) 134 lb 3.2 oz 132 lb 14.4 oz 135 lb  Weight (kg) 60.873 kg 60.283 kg 61.236 kg      Telemetry    NSR  - Personally Reviewed  ECG      - Personally Reviewed  Physical Exam   GEN: No acute distress.   Neck: No JVD Cardiac: RRR, no murmurs, rubs, or gallops.  Respiratory: Clear to auscultation bilaterally. GI: Soft, nontender, non-distended  MS: No edema; No deformity. Neuro:  Nonfocal  Psych: Normal affect   Labs    High Sensitivity Troponin:   Recent Labs  Lab 04/24/19 1023 04/24/19 1205 04/24/19 1450 04/24/19 1701  TROPONINIHS 10 75* 383* 744*      Chemistry Recent Labs  Lab 04/24/19 1023 04/25/19 0819  NA 133* 134*  K 3.2* 3.7  CL 96* 97*  CO2 22 21*  GLUCOSE 127* 128*  BUN 14 16  CREATININE 0.83 0.95  CALCIUM 9.1 9.4  PROT 7.5  --   ALBUMIN 3.9  --   AST 21  --   ALT 15  --   ALKPHOS 47  --  BILITOT 0.8  --   GFRNONAA >60 58*  GFRAA >60 >60  ANIONGAP 15 16*     Hematology Recent Labs  Lab 04/24/19 1023 04/25/19 0819  WBC 6.6 11.9*  RBC 4.30 4.59  HGB 13.6 14.6  HCT 39.9 41.5  MCV 92.8 90.4  MCH 31.6 31.8  MCHC 34.1 35.2  RDW 11.9 11.9  PLT 205 190    BNP Recent Labs  Lab 04/24/19 1023  BNP 155.8*     DDimer No results for input(s): DDIMER in the last 168 hours.   Radiology    Dg Chest Portable 1 View  Result Date: 04/24/2019 CLINICAL DATA:  Chest pain since yesterday EXAM: PORTABLE CHEST 1 VIEW COMPARISON:  June 03, 2015 FINDINGS: Interstitial prominence, likely chronic. No focal consolidation. No pleural effusion or pneumothorax. Normal heart size with evidence of prior cardiac surgery. Included osseous structures are unremarkable. IMPRESSION: No active disease. Electronically Signed   By: Macy Mis M.D.   On: 04/24/2019  11:11    Cardiac Studies      Patient Profile     78 y.o. female with CAD .     Assessment & Plan    Coronary artery disease: Patient presents with crescendo angina.  Her last heart catheterization was in July, 2020.  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  The native right coronary artery is likely a good candidate for CTO PCI.  This is tentatively planned for next Wednesday.  She is currently stable on IV nitroglycerin and IV heparin.  We are considering changing the heparin to Lovenox to minimize blood draws.  We will consult pharmacy for their opinion on this.  2.  Hypertension: Blood pressure is well controlled.  Continue current medications.  3.  Hyperlipidemia: Her LDL is 90.  She is currently on atorvastatin.  We will change her to rosuvastatin.  She is intolerant to Zetia and also Repatha.   For now we will continue with our current plans.  She is stable from a cardiac standpoint.  For questions or updates, please contact [place] Please consult [email or web address] for contact info under        Signed, Mertie Moores, MD  04/25/2019, 10:27 AM

## 2019-04-25 NOTE — Progress Notes (Signed)
Late Entry: Pt had episode CP 6/10 after playing cards with daughter @ 1958 relieved with up titration of IV ntg and 1 SL NTG.  EKG without acute changes noted.  Pt denies any further c/o.  Will continue to monitor pt closely.

## 2019-04-25 NOTE — Progress Notes (Signed)
   Received page from RN. Patient requesting stool softener. She states she takes Senokot 2 tablets at night. Will order Senokot as needed for constipation.  Darreld Mclean, PA-C 04/25/2019 5:31 PM

## 2019-04-26 ENCOUNTER — Inpatient Hospital Stay (HOSPITAL_COMMUNITY): Payer: Medicare Other

## 2019-04-26 DIAGNOSIS — I340 Nonrheumatic mitral (valve) insufficiency: Secondary | ICD-10-CM

## 2019-04-26 LAB — CBC
HCT: 37.9 % (ref 36.0–46.0)
Hemoglobin: 12.7 g/dL (ref 12.0–15.0)
MCH: 31.4 pg (ref 26.0–34.0)
MCHC: 33.5 g/dL (ref 30.0–36.0)
MCV: 93.6 fL (ref 80.0–100.0)
Platelets: 161 10*3/uL (ref 150–400)
RBC: 4.05 MIL/uL (ref 3.87–5.11)
RDW: 12.2 % (ref 11.5–15.5)
WBC: 8.1 10*3/uL (ref 4.0–10.5)
nRBC: 0 % (ref 0.0–0.2)

## 2019-04-26 LAB — ECHOCARDIOGRAM COMPLETE
Height: 67 in
Weight: 2124.8 oz

## 2019-04-26 LAB — BASIC METABOLIC PANEL
Anion gap: 10 (ref 5–15)
BUN: 19 mg/dL (ref 8–23)
CO2: 23 mmol/L (ref 22–32)
Calcium: 9.1 mg/dL (ref 8.9–10.3)
Chloride: 102 mmol/L (ref 98–111)
Creatinine, Ser: 0.85 mg/dL (ref 0.44–1.00)
GFR calc Af Amer: >60 mL/min (ref >60)
GFR calc non Af Amer: >60 mL/min (ref >60)
Glucose, Bld: 104 mg/dL — ABNORMAL HIGH (ref 70–99)
Potassium: 3.4 mmol/L — ABNORMAL LOW (ref 3.5–5.1)
Sodium: 135 mmol/L (ref 135–145)

## 2019-04-26 LAB — HEPARIN LEVEL (UNFRACTIONATED): Heparin Unfractionated: 0.5 IU/mL (ref 0.30–0.70)

## 2019-04-26 NOTE — Progress Notes (Signed)
  Echocardiogram 2D Echocardiogram has been performed.  Merrie Roof F 04/26/2019, 10:57 AM

## 2019-04-26 NOTE — Progress Notes (Signed)
ANTICOAGULATION CONSULT NOTE  Pharmacy Consult for Heparin Indication: chest pain/ACS  Patient Measurements: Height: 5\' 7"  (170.2 cm)(per patient report) Weight: 132 lb 12.8 oz (60.2 kg) IBW/kg (Calculated) : 61.6 Heparin Dosing Weight: 61 kg  Vital Signs: Temp: 98.6 F (37 C) (11/01 0013) Temp Source: Oral (11/01 0013) BP: 153/74 (11/01 0630) Pulse Rate: 70 (11/01 0013)  Labs: Recent Labs    04/24/19 1023 04/24/19 1205 04/24/19 1450 04/24/19 1701  04/25/19 0819 04/25/19 1604 04/26/19 0431  HGB 13.6  --   --   --   --  14.6  --  12.7  HCT 39.9  --   --   --   --  41.5  --  37.9  PLT 205  --   --   --   --  190  --  161  HEPARINUNFRC  --   --   --   --    < > 0.37 0.43 0.50  CREATININE 0.83  --   --   --   --  0.95  --  0.85  TROPONINIHS 10 75* 383* 744*  --   --   --   --    < > = values in this interval not displayed.    Estimated Creatinine Clearance: 53.5 mL/min (by C-G formula based on SCr of 0.85 mg/dL).   Assessment: 78 y/o female with PMH of CAD s/p CABG 2014 with maze procedure, SVG stent 2014, HTN, systoic and diastolic heart failure, and paroxysmal atrial fibrillation. Patient has been having angina symptoms for some time now and presents with a chief complaint of chest pain. Not no anticoagulation prior to admission. Pharmacy has been consulted to dose heparin.  Morning heparin level therapeutic, H&H stable, no infusion issues, no bleeding reported.  Goal of Therapy:  Heparin level 0.3-0.7 units/ml Monitor platelets by anticoagulation protocol: Yes   Plan:  Continue heparin IV at 900 units/hour Daily HL and CBC Monitor for signs and symptoms of bleeding   Thank you for allowing pharmacy to participate in this patient's care.  Cleophas Yoak L. Devin Going, [place] PGY1 Pharmacy Resident 04/26/19      8:05 AM  Please check AMION for all [place] phone numbers After 10:00 PM, call the [facility] [phone]

## 2019-04-26 NOTE — Progress Notes (Signed)
Progress Note  Patient Name: Claudia Howell Date of Encounter: 04/26/2019  Primary Cardiologist: Sinclair Grooms, MD   Subjective   78 year old female with a history of coronary artery disease, coronary artery bypass grafting.  She is status post Maze procedure.  She is status post PCI to the saphenous vein graft in 2014.  She has a history of paroxysmal atrial fibrillation.  She has been having crescendo angina for several months.  She had an angiogram in July which revealed an occluded SVG to RCA.  Dr. Debara Pickett discussed the possibility of CTO PCI with Dr. Emeterio Reeve yesterday and that is tentatively scheduled for Wednesday.      Inpatient Medications    Scheduled Meds: . aspirin EC  81 mg Oral Daily  . atenolol  100 mg Oral Daily  . benazepril  40 mg Oral Daily  . cholecalciferol  1,000 Units Oral Daily  . clopidogrel  75 mg Oral Q breakfast  . hydrochlorothiazide  25 mg Oral Daily  . multivitamin with minerals  1 tablet Oral Daily  . rosuvastatin  10 mg Oral q1800  . sodium chloride flush  3 mL Intravenous Q12H   Continuous Infusions: . sodium chloride    . heparin 900 Units/hr (04/25/19 1420)  . nitroGLYCERIN 15 mcg/min (04/25/19 1955)   PRN Meds: sodium chloride, acetaminophen, nitroGLYCERIN, ondansetron (ZOFRAN) IV, senna, sodium chloride flush   Vital Signs    Vitals:   04/26/19 0013 04/26/19 0435 04/26/19 0630 04/26/19 0837  BP: (!) 147/76  (!) 153/74 134/65  Pulse: 70     Resp: 15     Temp: 98.6 F (37 C)     TempSrc: Oral     SpO2: 96%     Weight:  60.2 kg    Height:        Intake/Output Summary (Last 24 hours) at 04/26/2019 0938 Last data filed at 04/26/2019 0700 Gross per 24 hour  Intake 402.43 ml  Output -  Net 402.43 ml   Last 3 Weights 04/26/2019 04/25/2019 04/24/2019  Weight (lbs) 132 lb 12.8 oz 134 lb 3.2 oz 132 lb 14.4 oz  Weight (kg) 60.238 kg 60.873 kg 60.283 kg      Telemetry    NSR  - Personally Reviewed  ECG      - Personally  Reviewed  Physical Exam   Physical Exam: Blood pressure 134/65, pulse 70, temperature 98.6 F (37 C), temperature source Oral, resp. rate 15, height 5\' 7"  (1.702 m), weight 60.2 kg, SpO2 96 %.  GEN:  Well nourished, well developed in no acute distress HEENT: Normal NECK: No JVD; No carotid bruits LYMPHATICS: No lymphadenopathy CARDIAC: RRR , no murmurs, rubs, gallops RESPIRATORY:  Clear to auscultation without rales, wheezing or rhonchi  ABDOMEN: Soft, non-tender, non-distended MUSCULOSKELETAL:  No edema; No deformity  SKIN: Warm and dry NEUROLOGIC:  Alert and oriented x 3   Labs    High Sensitivity Troponin:   Recent Labs  Lab 04/24/19 1023 04/24/19 1205 04/24/19 1450 04/24/19 1701  TROPONINIHS 10 75* 383* 744*      Chemistry Recent Labs  Lab 04/24/19 1023 04/25/19 0819 04/26/19 0431  NA 133* 134* 135  K 3.2* 3.7 3.4*  CL 96* 97* 102  CO2 22 21* 23  GLUCOSE 127* 128* 104*  BUN 14 16 19   CREATININE 0.83 0.95 0.85  CALCIUM 9.1 9.4 9.1  PROT 7.5  --   --   ALBUMIN 3.9  --   --  AST 21  --   --   ALT 15  --   --   ALKPHOS 47  --   --   BILITOT 0.8  --   --   GFRNONAA >60 58* >60  GFRAA >60 >60 >60  ANIONGAP 15 16* 10     Hematology Recent Labs  Lab 04/24/19 1023 04/25/19 0819 04/26/19 0431  WBC 6.6 11.9* 8.1  RBC 4.30 4.59 4.05  HGB 13.6 14.6 12.7  HCT 39.9 41.5 37.9  MCV 92.8 90.4 93.6  MCH 31.6 31.8 31.4  MCHC 34.1 35.2 33.5  RDW 11.9 11.9 12.2  PLT 205 190 161    BNP Recent Labs  Lab 04/24/19 1023  BNP 155.8*     DDimer No results for input(s): DDIMER in the last 168 hours.   Radiology    Dg Chest Portable 1 View  Result Date: 04/24/2019 CLINICAL DATA:  Chest pain since yesterday EXAM: PORTABLE CHEST 1 VIEW COMPARISON:  June 03, 2015 FINDINGS: Interstitial prominence, likely chronic. No focal consolidation. No pleural effusion or pneumothorax. Normal heart size with evidence of prior cardiac surgery. Included osseous  structures are unremarkable. IMPRESSION: No active disease. Electronically Signed   By: Macy Mis M.D.   On: 04/24/2019 11:11    Cardiac Studies      Patient Profile     79 y.o. female with CAD .     Assessment & Plan    Coronary artery disease: Patient presents with crescendo angina.  Her last heart catheterization was in July, 2020.     She had some chest discomfort last night.  It was relieved with increasing of the nitroglycerin drip and a sublingual nitroglycerin. She is scheduled for PCI on Wednesday with Dr. Irish Lack.  2.  Hypertension: Blood pressure remains well controlled.  Continue current medications.  3.  Hyperlipidemia: We have changed her to rosuvastatin.  For now we will continue with our current plans.  She is stable from a cardiac standpoint.  For questions or updates, please contact [place] Please consult [email or web address] for contact info under        Signed, Mertie Moores, MD  04/26/2019, 9:38 AM

## 2019-04-27 DIAGNOSIS — E7800 Pure hypercholesterolemia, unspecified: Secondary | ICD-10-CM

## 2019-04-27 LAB — CBC
HCT: 36.5 % (ref 36.0–46.0)
Hemoglobin: 12.3 g/dL (ref 12.0–15.0)
MCH: 31.5 pg (ref 26.0–34.0)
MCHC: 33.7 g/dL (ref 30.0–36.0)
MCV: 93.6 fL (ref 80.0–100.0)
Platelets: 184 10*3/uL (ref 150–400)
RBC: 3.9 MIL/uL (ref 3.87–5.11)
RDW: 12.2 % (ref 11.5–15.5)
WBC: 7.5 10*3/uL (ref 4.0–10.5)
nRBC: 0 % (ref 0.0–0.2)

## 2019-04-27 LAB — BASIC METABOLIC PANEL
Anion gap: 11 (ref 5–15)
BUN: 22 mg/dL (ref 8–23)
CO2: 21 mmol/L — ABNORMAL LOW (ref 22–32)
Calcium: 9 mg/dL (ref 8.9–10.3)
Chloride: 102 mmol/L (ref 98–111)
Creatinine, Ser: 0.88 mg/dL (ref 0.44–1.00)
GFR calc Af Amer: >60 mL/min (ref >60)
GFR calc non Af Amer: >60 mL/min (ref >60)
Glucose, Bld: 109 mg/dL — ABNORMAL HIGH (ref 70–99)
Potassium: 3.7 mmol/L (ref 3.5–5.1)
Sodium: 134 mmol/L — ABNORMAL LOW (ref 135–145)

## 2019-04-27 LAB — HEPARIN LEVEL (UNFRACTIONATED): Heparin Unfractionated: 0.41 IU/mL (ref 0.30–0.70)

## 2019-04-27 MED ORDER — SODIUM CHLORIDE 0.9% FLUSH: 3.0000 mL | Freq: Two times a day (BID) | INTRAVENOUS | Status: DC

## 2019-04-27 MED ORDER — SODIUM CHLORIDE 0.9 % IV SOLN: 250.0000 mL | INTRAVENOUS | Status: DC | PRN

## 2019-04-27 MED ORDER — ASPIRIN 81 MG PO CHEW
81.0000 mg | CHEWABLE_TABLET | ORAL | Status: AC
  Administered 2019-04-28: 81 mg via ORAL
  Filled 2019-04-27: qty 1

## 2019-04-27 MED ORDER — PREDNISONE 20 MG PO TABS
50.0000 mg | ORAL_TABLET | Freq: Four times a day (QID) | ORAL | Status: AC
  Administered 2019-04-27 – 2019-04-28 (×3): 50 mg via ORAL
  Filled 2019-04-27 (×2): qty 2
  Filled 2019-04-27: qty 1

## 2019-04-27 MED ORDER — DIPHENHYDRAMINE HCL 50 MG/ML IJ SOLN
50.0000 mg | Freq: Once | INTRAMUSCULAR | Status: AC
  Administered 2019-04-28: 50 mg via INTRAVENOUS
  Filled 2019-04-27: qty 1

## 2019-04-27 MED ORDER — SODIUM CHLORIDE 0.9% FLUSH: 3.0000 mL | INTRAVENOUS | Status: DC | PRN

## 2019-04-27 MED ORDER — ANGIOPLASTY BOOK
Freq: Once | Status: DC
  Filled 2019-04-27: qty 1

## 2019-04-27 MED ORDER — DIPHENHYDRAMINE HCL 25 MG PO CAPS: 50.0000 mg | ORAL_CAPSULE | Freq: Once | ORAL | Status: DC

## 2019-04-27 MED ORDER — SODIUM CHLORIDE 0.9 % WEIGHT BASED INFUSION
1.0000 mL/kg/h | INTRAVENOUS | Status: DC
  Administered 2019-04-27: 1 mL/kg/h via INTRAVENOUS

## 2019-04-27 MED ORDER — DIPHENHYDRAMINE HCL 25 MG PO CAPS: 50.0000 mg | ORAL_CAPSULE | Freq: Once | ORAL | Status: AC

## 2019-04-27 MED ORDER — DIPHENHYDRAMINE HCL 50 MG/ML IJ SOLN: 50.0000 mg | Freq: Once | INTRAMUSCULAR | Status: DC

## 2019-04-27 NOTE — Progress Notes (Signed)
Responded to Spiritual Consult for Claudia Howell's priest to visit her. Mrs. Swartwout invited me in and to sit with her as I explained if it were possible, we would have to get special permission for her priest from [place] to come.  She spoke of her medical situation and hoped he could come with the sacraments prior to her risky heart surgery on Wednesday. The [facility] gave permission for Monsignor Marcaccio to come today accompanied by her son Harrell Gave.  Chaplain intends to continue ministry with Mrs. Gutkowski  tomorrow and prior to surgery on Wednesday.  Will visit after surgery as indicated.  De [place]rs Chaplain Resident

## 2019-04-27 NOTE — Progress Notes (Signed)
ANTICOAGULATION CONSULT NOTE  Pharmacy Consult for Heparin Indication: chest pain/ACS  Patient Measurements: Height: 5\' 7"  (170.2 cm)(per patient report) Weight: 130 lb 6.4 oz (59.1 kg) IBW/kg (Calculated) : 61.6 Heparin Dosing Weight: 61 kg  Vital Signs: Temp: 98.6 F (37 C) (11/02 0845) Temp Source: Oral (11/02 0845) BP: 148/73 (11/02 0845) Pulse Rate: 81 (11/02 0845)  Labs: Recent Labs    04/24/19 1205 04/24/19 1450 04/24/19 1701  04/25/19 0819 04/25/19 1604 04/26/19 0431 04/27/19 0523  HGB  --   --   --    < > 14.6  --  12.7 12.3  HCT  --   --   --   --  41.5  --  37.9 36.5  PLT  --   --   --   --  190  --  161 184  HEPARINUNFRC  --   --   --    < > 0.37 0.43 0.50 0.41  CREATININE  --   --   --   --  0.95  --  0.85 0.88  TROPONINIHS 75* 383* 744*  --   --   --   --   --    < > = values in this interval not displayed.    Estimated Creatinine Clearance: 50.7 mL/min (by C-G formula based on SCr of 0.88 mg/dL).   Assessment: 78 y/o female with PMH of CAD s/p CABG 2014 with maze procedure, SVG stent 2014, HTN, systoic and diastolic heart failure, and paroxysmal atrial fibrillation. Patient has been having angina symptoms for some time now and presents with a chief complaint of chest pain. Not no anticoagulation prior to admission. Pharmacy has been consulted to dose heparin. Plans noted for cath on 11/4 -heparin level at goal  Goal of Therapy:  Heparin level 0.3-0.7 units/ml Monitor platelets by anticoagulation protocol: Yes   Plan:  Continue heparin IV at 900 units/hour Daily HL and CBC  Hildred Laser, PharmD Clinical Pharmacist **Pharmacist phone directory can now be found on [email or web address] ([email or web address]).  Listed under [place].  \

## 2019-04-27 NOTE — Progress Notes (Addendum)
Progress Note  Patient Name: Claudia Howell Date of Encounter: 04/27/2019  Primary Cardiologist: Belva Crome III, MD   Subjective   No CP or SOB. No lower leg edema. Possible plan for CTO PCI on Wednesday.  Inpatient Medications    Scheduled Meds: . aspirin EC  81 mg Oral Daily  . atenolol  100 mg Oral Daily  . benazepril  40 mg Oral Daily  . cholecalciferol  1,000 Units Oral Daily  . clopidogrel  75 mg Oral Q breakfast  . hydrochlorothiazide  25 mg Oral Daily  . multivitamin with minerals  1 tablet Oral Daily  . rosuvastatin  10 mg Oral q1800  . sodium chloride flush  3 mL Intravenous Q12H   Continuous Infusions: . sodium chloride    . heparin 900 Units/hr (04/26/19 1725)  . nitroGLYCERIN 15 mcg/min (04/25/19 1955)   PRN Meds: sodium chloride, acetaminophen, nitroGLYCERIN, ondansetron (ZOFRAN) IV, senna, sodium chloride flush   Vital Signs    Vitals:   04/26/19 1707 04/26/19 1954 04/27/19 0036 04/27/19 0544  BP: 137/63 (!) 127/56 112/63 (!) 146/71  Pulse: 68 66 64 69  Resp: 18 18 16 19   Temp:  98.3 F (36.8 C) 98.2 F (36.8 C) 98.3 F (36.8 C)  TempSrc:  Oral Oral Oral  SpO2:  95% 96% 98%  Weight:    59.1 kg  Height:        Intake/Output Summary (Last 24 hours) at 04/27/2019 0836 Last data filed at 04/27/2019 0600 Gross per 24 hour  Intake 310.5 ml  Output -  Net 310.5 ml   Last 3 Weights 04/27/2019 04/26/2019 04/25/2019  Weight (lbs) 130 lb 6.4 oz 132 lb 12.8 oz 134 lb 3.2 oz  Weight (kg) 59.149 kg 60.238 kg 60.873 kg      Telemetry    NSR, Hr 60-70s, occasional PVCs - Personally Reviewed  ECG    No new - Personally Reviewed  Physical Exam   GEN: No acute distress.   Neck: No JVD Cardiac: RRR, no murmurs, rubs, or gallops.  Respiratory: Clear to auscultation bilaterally. GI: Soft, nontender, non-distended  MS: No edema; No deformity. Neuro:  Nonfocal  Psych: Normal affect   Labs    High Sensitivity Troponin:   Recent Labs  Lab  04/24/19 1023 04/24/19 1205 04/24/19 1450 04/24/19 1701  TROPONINIHS 10 75* 383* 744*      Chemistry Recent Labs  Lab 04/24/19 1023 04/25/19 0819 04/26/19 0431 04/27/19 0523  NA 133* 134* 135 134*  K 3.2* 3.7 3.4* 3.7  CL 96* 97* 102 102  CO2 22 21* 23 21*  GLUCOSE 127* 128* 104* 109*  BUN 14 16 19 22   CREATININE 0.83 0.95 0.85 0.88  CALCIUM 9.1 9.4 9.1 9.0  PROT 7.5  --   --   --   ALBUMIN 3.9  --   --   --   AST 21  --   --   --   ALT 15  --   --   --   ALKPHOS 47  --   --   --   BILITOT 0.8  --   --   --   GFRNONAA >60 58* >60 >60  GFRAA >60 >60 >60 >60  ANIONGAP 15 16* 10 11     Hematology Recent Labs  Lab 04/25/19 0819 04/26/19 0431 04/27/19 0523  WBC 11.9* 8.1 7.5  RBC 4.59 4.05 3.90  HGB 14.6 12.7 12.3  HCT 41.5 37.9 36.5  MCV  90.4 93.6 93.6  MCH 31.8 31.4 31.5  MCHC 35.2 33.5 33.7  RDW 11.9 12.2 12.2  PLT 190 161 184    BNP Recent Labs  Lab 04/24/19 1023  BNP 155.8*     DDimer No results for input(s): DDIMER in the last 168 hours.   Radiology    No results found.  Cardiac Studies   Left Heart Cath 01/01/19  Total occlusion of the saphenous vein graft to the right coronary.  Native RCA is severely diseased with ostial 60%, proximal to mid 75%, and mid 99%.  There is distal 90% beyond the origin of the PDA and the continuation.  Faint left to right collaterals are noted.  Failed angioplasty of the native right coronary due to inability to cross the severe mid vessel stenosis with a wire. (wire escalation was used).  Patent LIMA to LAD, and patent saphenous vein graft to the circumflex marginal.  90% mid LAD and diffuse 50% proximal LAD stenosis.  Small first diagonal contains 75% ostial stenosis.  Total occlusion of the proximal circumflex.  Normal left ventricular function.  LVEDP less than 15.  EF 55%.   RECOMMENDATIONS:   Continue medical therapy for control of angina.  This will include beta-blocker and long-acting  nitrate therapy.  Encouraging that collaterals are noted and hopefully will improve with time.  Consult CTO team to determine if this lesion could be treated as a CTO.  Continue Plavix for now.  Bedrest for 4 hours following Mynx.  Discharge later today if stable      Echo 04/26/19  1. Left ventricular ejection fraction, by visual estimation, is 55 to 60%. The left ventricle has normal function. There is no left ventricular hypertrophy.  2. Left ventricular diastolic parameters are consistent with Grade II diastolic dysfunction (pseudonormalization).  3. Global right ventricle has normal systolic function.The right ventricular size is normal. No increase in right ventricular wall thickness.  4. Left atrial size was mildly dilated.  5. Right atrial size was mildly dilated.  6. The mitral valve is normal in structure. Mild mitral valve regurgitation. No evidence of mitral stenosis.  7. The tricuspid valve is normal in structure. Tricuspid valve regurgitation is trivial.  8. The aortic valve is tricuspid. Aortic valve regurgitation is not visualized. No evidence of aortic valve sclerosis or stenosis.  9. The pulmonic valve was normal in structure. Pulmonic valve regurgitation is not visualized. 10. The inferior vena cava is normal in size with greater than 50% respiratory variability, suggesting right atrial pressure of 3 mmHg.  Patient Profile     78 y.o. female with CAD s/p CABG  2014 with maze procedure, SVG stent 2014, HTN, chronic combined systolic and diastolic HF, paroxysmal afib s/p Maze procedure, and recent cath in July 2020 showing severe disease in the RCA, patent graft, 90% lesion beyond PDA origin with inability to cross the severe mid vessel stenosis with a wire. Medical management was continued at that time. Patient presented 04/24/19 for chest pain and tentative plan for PCI on  Assessment & Plan    Unstable Angina/CAD s/p CABG 2014 - Heart cath in July 2020 showing  severe disease in the RCA, 90% lesion beyond PDA origin with inability to cross the severe mid vessel stenosis with a wire, patent graft. Medical management was continued at that time. - Presented 04/24/19 with unstable angina, relieved with NTG, placed on NTG drip - Plan for CTO PCI to RCA on Wednesday with Dr. Martinique. Will confirm - Echo  this admission showed EF 55-60%, G1DD - Denies recurrent CP and SOB - continue ASA and Plavix - continue statin - continue NTG drip for now. Pressures stable - Creatinine 99991111  Chronic diastolic HF - Patient takes Lasix 20 mg as needed - BNP 155 - Pt does not appear to be volume overloaded  Paroxysmal Afib s/p maze procedure in 2014 - maintaining NSR  HTN - HCTZ 25 mg, Atenolol 100 mg daily, benazepril 40 mg daily - creatinine  Hyperlipidemia - LDL 90 this admission.  - Previous notes suggest she refused PCSK9-I in the past and has had some intolerance to statins. - Lipitor was changed to Rosuvastatin 10mg   For questions or updates, please contact [facility] Please consult [email or web address] for contact info under        Signed, Cadence Ninfa Meeker, PA-C  04/27/2019, 8:36 AM    I have seen and examined the patient along with Cadence Ninfa Meeker, PA-C.  I have reviewed the chart, notes and new data.  I agree with PA/NP's note.  Key new complaints: Currently angina free.  The reports having a extremely pruritic rash after her contrast based procedure a few months ago. Key examination changes: No clinical evidence of hypervolemia, looks quite comfortable.  Normal cardiovascular exam Key new findings / data: Discuss angiograms from the summer with Dr. Peter Martinique.  It is unclear whether the CTO can be blamed for the current episode of non-STEMI.  He recommends a repeat, updated coronary angiogram to be performed prior to the planned chronic total occlusion PCI scheduled for Wednesday.  PLAN: Needs contrast allergy precautions before any procedure.  We  will start steroids tonight. Plan diagnostic coronary/graft angiography in the morning. Possible CTO PCI on Wednesday. Discussed the plan of care with the patient and her son and they agree.  Sanda Klein, MD, [place] [phone] 04/27/2019, 1:01 PM

## 2019-04-27 NOTE — H&P (View-Only) (Signed)
Progress Note  Patient Name: Claudia Howell Date of Encounter: 04/27/2019  Primary Cardiologist: Belva Crome III, MD   Subjective   No CP or SOB. No lower leg edema. Possible plan for CTO PCI on Wednesday.  Inpatient Medications    Scheduled Meds: . aspirin EC  81 mg Oral Daily  . atenolol  100 mg Oral Daily  . benazepril  40 mg Oral Daily  . cholecalciferol  1,000 Units Oral Daily  . clopidogrel  75 mg Oral Q breakfast  . hydrochlorothiazide  25 mg Oral Daily  . multivitamin with minerals  1 tablet Oral Daily  . rosuvastatin  10 mg Oral q1800  . sodium chloride flush  3 mL Intravenous Q12H   Continuous Infusions: . sodium chloride    . heparin 900 Units/hr (04/26/19 1725)  . nitroGLYCERIN 15 mcg/min (04/25/19 1955)   PRN Meds: sodium chloride, acetaminophen, nitroGLYCERIN, ondansetron (ZOFRAN) IV, senna, sodium chloride flush   Vital Signs    Vitals:   04/26/19 1707 04/26/19 1954 04/27/19 0036 04/27/19 0544  BP: 137/63 (!) 127/56 112/63 (!) 146/71  Pulse: 68 66 64 69  Resp: 18 18 16 19   Temp:  98.3 F (36.8 C) 98.2 F (36.8 C) 98.3 F (36.8 C)  TempSrc:  Oral Oral Oral  SpO2:  95% 96% 98%  Weight:    59.1 kg  Height:        Intake/Output Summary (Last 24 hours) at 04/27/2019 0836 Last data filed at 04/27/2019 0600 Gross per 24 hour  Intake 310.5 ml  Output -  Net 310.5 ml   Last 3 Weights 04/27/2019 04/26/2019 04/25/2019  Weight (lbs) 130 lb 6.4 oz 132 lb 12.8 oz 134 lb 3.2 oz  Weight (kg) 59.149 kg 60.238 kg 60.873 kg      Telemetry    NSR, Hr 60-70s, occasional PVCs - Personally Reviewed  ECG    No new - Personally Reviewed  Physical Exam   GEN: No acute distress.   Neck: No JVD Cardiac: RRR, no murmurs, rubs, or gallops.  Respiratory: Clear to auscultation bilaterally. GI: Soft, nontender, non-distended  MS: No edema; No deformity. Neuro:  Nonfocal  Psych: Normal affect   Labs    High Sensitivity Troponin:   Recent Labs  Lab  04/24/19 1023 04/24/19 1205 04/24/19 1450 04/24/19 1701  TROPONINIHS 10 75* 383* 744*      Chemistry Recent Labs  Lab 04/24/19 1023 04/25/19 0819 04/26/19 0431 04/27/19 0523  NA 133* 134* 135 134*  K 3.2* 3.7 3.4* 3.7  CL 96* 97* 102 102  CO2 22 21* 23 21*  GLUCOSE 127* 128* 104* 109*  BUN 14 16 19 22   CREATININE 0.83 0.95 0.85 0.88  CALCIUM 9.1 9.4 9.1 9.0  PROT 7.5  --   --   --   ALBUMIN 3.9  --   --   --   AST 21  --   --   --   ALT 15  --   --   --   ALKPHOS 47  --   --   --   BILITOT 0.8  --   --   --   GFRNONAA >60 58* >60 >60  GFRAA >60 >60 >60 >60  ANIONGAP 15 16* 10 11     Hematology Recent Labs  Lab 04/25/19 0819 04/26/19 0431 04/27/19 0523  WBC 11.9* 8.1 7.5  RBC 4.59 4.05 3.90  HGB 14.6 12.7 12.3  HCT 41.5 37.9 36.5  MCV  90.4 93.6 93.6  MCH 31.8 31.4 31.5  MCHC 35.2 33.5 33.7  RDW 11.9 12.2 12.2  PLT 190 161 184    BNP Recent Labs  Lab 04/24/19 1023  BNP 155.8*     DDimer No results for input(s): DDIMER in the last 168 hours.   Radiology    No results found.  Cardiac Studies   Left Heart Cath 01/01/19  Total occlusion of the saphenous vein graft to the right coronary.  Native RCA is severely diseased with ostial 60%, proximal to mid 75%, and mid 99%.  There is distal 90% beyond the origin of the PDA and the continuation.  Faint left to right collaterals are noted.  Failed angioplasty of the native right coronary due to inability to cross the severe mid vessel stenosis with a wire. (wire escalation was used).  Patent LIMA to LAD, and patent saphenous vein graft to the circumflex marginal.  90% mid LAD and diffuse 50% proximal LAD stenosis.  Small first diagonal contains 75% ostial stenosis.  Total occlusion of the proximal circumflex.  Normal left ventricular function.  LVEDP less than 15.  EF 55%.   RECOMMENDATIONS:   Continue medical therapy for control of angina.  This will include beta-blocker and long-acting  nitrate therapy.  Encouraging that collaterals are noted and hopefully will improve with time.  Consult CTO team to determine if this lesion could be treated as a CTO.  Continue Plavix for now.  Bedrest for 4 hours following Mynx.  Discharge later today if stable      Echo 04/26/19  1. Left ventricular ejection fraction, by visual estimation, is 55 to 60%. The left ventricle has normal function. There is no left ventricular hypertrophy.  2. Left ventricular diastolic parameters are consistent with Grade II diastolic dysfunction (pseudonormalization).  3. Global right ventricle has normal systolic function.The right ventricular size is normal. No increase in right ventricular wall thickness.  4. Left atrial size was mildly dilated.  5. Right atrial size was mildly dilated.  6. The mitral valve is normal in structure. Mild mitral valve regurgitation. No evidence of mitral stenosis.  7. The tricuspid valve is normal in structure. Tricuspid valve regurgitation is trivial.  8. The aortic valve is tricuspid. Aortic valve regurgitation is not visualized. No evidence of aortic valve sclerosis or stenosis.  9. The pulmonic valve was normal in structure. Pulmonic valve regurgitation is not visualized. 10. The inferior vena cava is normal in size with greater than 50% respiratory variability, suggesting right atrial pressure of 3 mmHg.  Patient Profile     78 y.o. female with CAD s/p CABG  2014 with maze procedure, SVG stent 2014, HTN, chronic combined systolic and diastolic HF, paroxysmal afib s/p Maze procedure, and recent cath in July 2020 showing severe disease in the RCA, patent graft, 90% lesion beyond PDA origin with inability to cross the severe mid vessel stenosis with a wire. Medical management was continued at that time. Patient presented 04/24/19 for chest pain and tentative plan for PCI on  Assessment & Plan    Unstable Angina/CAD s/p CABG 2014 - Heart cath in July 2020 showing  severe disease in the RCA, 90% lesion beyond PDA origin with inability to cross the severe mid vessel stenosis with a wire, patent graft. Medical management was continued at that time. - Presented 04/24/19 with unstable angina, relieved with NTG, placed on NTG drip - Plan for CTO PCI to RCA on Wednesday with Dr. Martinique. Will confirm - Echo  this admission showed EF 55-60%, G1DD - Denies recurrent CP and SOB - continue ASA and Plavix - continue statin - continue NTG drip for now. Pressures stable - Creatinine [id number]  Chronic diastolic HF - Patient takes Lasix 20 mg as needed - BNP 155 - Pt does not appear to be volume overloaded  Paroxysmal Afib s/p maze procedure in 2014 - maintaining NSR  HTN - HCTZ 25 mg, Atenolol 100 mg daily, benazepril 40 mg daily - creatinine  Hyperlipidemia - LDL 90 this admission.  - Previous notes suggest she refused PCSK9-I in the past and has had some intolerance to statins. - Lipitor was changed to Rosuvastatin 10mg   For questions or updates, please contact [facility] Please consult [email or web address] for contact info under        Signed, Cadence Ninfa Meeker, PA-C  04/27/2019, 8:36 AM    I have seen and examined the patient along with Cadence Ninfa Meeker, PA-C.  I have reviewed the chart, notes and new data.  I agree with PA/NP's note.  Key new complaints: Currently angina free.  The reports having a extremely pruritic rash after her contrast based procedure a few months ago. Key examination changes: No clinical evidence of hypervolemia, looks quite comfortable.  Normal cardiovascular exam Key new findings / data: Discuss angiograms from the summer with Dr. Peter Martinique.  It is unclear whether the CTO can be blamed for the current episode of non-STEMI.  He recommends a repeat, updated coronary angiogram to be performed prior to the planned chronic total occlusion PCI scheduled for Wednesday.  PLAN: Needs contrast allergy precautions before any procedure.  We  will start steroids tonight. Plan diagnostic coronary/graft angiography in the morning. Possible CTO PCI on Wednesday. Discussed the plan of care with the patient and her son and they agree.  Sanda Klein, MD, [place] [phone] 04/27/2019, 1:01 PM

## 2019-04-28 ENCOUNTER — Encounter (HOSPITAL_COMMUNITY)
Admission: EM | Disposition: A | Discharge status: Home / Self Care | Payer: Self-pay | Source: Home / Self Care | Attending: Internal Medicine

## 2019-04-28 ENCOUNTER — Encounter (HOSPITAL_COMMUNITY): Payer: Self-pay | Admitting: Interventional Cardiology

## 2019-04-28 DIAGNOSIS — R778 Other specified abnormalities of plasma proteins: Secondary | ICD-10-CM

## 2019-04-28 DIAGNOSIS — I25710 Atherosclerosis of autologous vein coronary artery bypass graft(s) with unstable angina pectoris: Secondary | ICD-10-CM

## 2019-04-28 DIAGNOSIS — I25110 Atherosclerotic heart disease of native coronary artery with unstable angina pectoris: Secondary | ICD-10-CM

## 2019-04-28 HISTORY — PX: LEFT HEART CATH AND CORS/GRAFTS ANGIOGRAPHY: CATH118250

## 2019-04-28 LAB — CBC
HCT: 35.7 % — ABNORMAL LOW (ref 36.0–46.0)
HCT: 33.7 % — ABNORMAL LOW (ref 36.0–46.0)
Hemoglobin: 12 g/dL (ref 12.0–15.0)
Hemoglobin: 11.4 g/dL — ABNORMAL LOW (ref 12.0–15.0)
MCH: 31.4 pg (ref 26.0–34.0)
MCH: 31.6 pg (ref 26.0–34.0)
MCHC: 33.6 g/dL (ref 30.0–36.0)
MCHC: 33.8 g/dL (ref 30.0–36.0)
MCV: 93.5 fL (ref 80.0–100.0)
MCV: 93.4 fL (ref 80.0–100.0)
Platelets: 199 10*3/uL (ref 150–400)
Platelets: 212 10*3/uL (ref 150–400)
RBC: 3.82 MIL/uL — ABNORMAL LOW (ref 3.87–5.11)
RBC: 3.61 MIL/uL — ABNORMAL LOW (ref 3.87–5.11)
RDW: 12.1 % (ref 11.5–15.5)
RDW: 12.1 % (ref 11.5–15.5)
WBC: 6.9 10*3/uL (ref 4.0–10.5)
WBC: 16 10*3/uL — ABNORMAL HIGH (ref 4.0–10.5)
nRBC: 0 % (ref 0.0–0.2)
nRBC: 0 % (ref 0.0–0.2)

## 2019-04-28 LAB — BASIC METABOLIC PANEL
Anion gap: 11 (ref 5–15)
Anion gap: 10 (ref 5–15)
BUN: 19 mg/dL (ref 8–23)
BUN: 21 mg/dL (ref 8–23)
CO2: 19 mmol/L — ABNORMAL LOW (ref 22–32)
CO2: 19 mmol/L — ABNORMAL LOW (ref 22–32)
Calcium: 9.2 mg/dL (ref 8.9–10.3)
Calcium: 9 mg/dL (ref 8.9–10.3)
Chloride: 104 mmol/L (ref 98–111)
Chloride: 102 mmol/L (ref 98–111)
Creatinine, Ser: 0.83 mg/dL (ref 0.44–1.00)
Creatinine, Ser: 0.87 mg/dL (ref 0.44–1.00)
GFR calc Af Amer: >60 mL/min (ref >60)
GFR calc Af Amer: >60 mL/min (ref >60)
GFR calc non Af Amer: >60 mL/min (ref >60)
GFR calc non Af Amer: >60 mL/min (ref >60)
Glucose, Bld: 174 mg/dL — ABNORMAL HIGH (ref 70–99)
Glucose, Bld: 148 mg/dL — ABNORMAL HIGH (ref 70–99)
Potassium: 3.5 mmol/L (ref 3.5–5.1)
Potassium: 3.4 mmol/L — ABNORMAL LOW (ref 3.5–5.1)
Sodium: 134 mmol/L — ABNORMAL LOW (ref 135–145)
Sodium: 131 mmol/L — ABNORMAL LOW (ref 135–145)

## 2019-04-28 LAB — HEPARIN LEVEL (UNFRACTIONATED): Heparin Unfractionated: 0.45 IU/mL (ref 0.30–0.70)

## 2019-04-28 SURGERY — LEFT HEART CATH AND CORS/GRAFTS ANGIOGRAPHY
Anesthesia: LOCAL

## 2019-04-28 MED ORDER — HYDRALAZINE HCL 20 MG/ML IJ SOLN: 10.0000 mg | INTRAMUSCULAR | Status: AC | PRN

## 2019-04-28 MED ORDER — ACETAMINOPHEN 325 MG PO TABS: 650.0000 mg | ORAL_TABLET | ORAL | Status: DC | PRN

## 2019-04-28 MED ORDER — FENTANYL CITRATE (PF) 100 MCG/2ML IJ SOLN
INTRAMUSCULAR | Status: DC | PRN
  Administered 2019-04-28: 25 ug via INTRAVENOUS

## 2019-04-28 MED ORDER — HEPARIN (PORCINE) IN NACL 1000-0.9 UT/500ML-% IV SOLN
INTRAVENOUS | Status: AC
  Filled 2019-04-28: qty 1000

## 2019-04-28 MED ORDER — DIPHENHYDRAMINE HCL 50 MG/ML IJ SOLN
50.0000 mg | Freq: Once | INTRAMUSCULAR | Status: AC
  Administered 2019-04-29: 50 mg via INTRAVENOUS
  Filled 2019-04-28: qty 1

## 2019-04-28 MED ORDER — HEPARIN (PORCINE) IN NACL 1000-0.9 UT/500ML-% IV SOLN
INTRAVENOUS | Status: DC | PRN
  Administered 2019-04-28 (×2): 500 mL

## 2019-04-28 MED ORDER — FENTANYL CITRATE (PF) 100 MCG/2ML IJ SOLN
INTRAMUSCULAR | Status: AC
  Filled 2019-04-28: qty 2

## 2019-04-28 MED ORDER — LABETALOL HCL 5 MG/ML IV SOLN: 10.0000 mg | INTRAVENOUS | Status: AC | PRN

## 2019-04-28 MED ORDER — SODIUM CHLORIDE 0.9% FLUSH: 3.0000 mL | Freq: Two times a day (BID) | INTRAVENOUS | Status: DC

## 2019-04-28 MED ORDER — ASPIRIN 81 MG PO CHEW: 81.0000 mg | CHEWABLE_TABLET | Freq: Every day | ORAL | Status: DC

## 2019-04-28 MED ORDER — SODIUM CHLORIDE 0.9 % IV SOLN: 250.0000 mL | INTRAVENOUS | Status: DC | PRN

## 2019-04-28 MED ORDER — LIDOCAINE HCL (PF) 1 % IJ SOLN
INTRAMUSCULAR | Status: DC | PRN
  Administered 2019-04-28: 2 mL

## 2019-04-28 MED ORDER — OXYCODONE HCL 5 MG PO TABS: 5.0000 mg | ORAL_TABLET | ORAL | Status: DC | PRN

## 2019-04-28 MED ORDER — CLOPIDOGREL BISULFATE 75 MG PO TABS: 75.0000 mg | ORAL_TABLET | Freq: Every day | ORAL | Status: AC

## 2019-04-28 MED ORDER — CLOPIDOGREL BISULFATE 75 MG PO TABS: 75.0000 mg | ORAL_TABLET | Freq: Every day | ORAL | Status: DC

## 2019-04-28 MED ORDER — DIPHENHYDRAMINE HCL 25 MG PO CAPS: 50.0000 mg | ORAL_CAPSULE | Freq: Once | ORAL | Status: AC

## 2019-04-28 MED ORDER — VERAPAMIL HCL 2.5 MG/ML IV SOLN
INTRAVENOUS | Status: DC | PRN
  Administered 2019-04-28: 10 mL via INTRA_ARTERIAL

## 2019-04-28 MED ORDER — SODIUM CHLORIDE 0.9% FLUSH: 3.0000 mL | INTRAVENOUS | Status: DC | PRN

## 2019-04-28 MED ORDER — MIDAZOLAM HCL 2 MG/2ML IJ SOLN
INTRAMUSCULAR | Status: DC | PRN
  Administered 2019-04-28: 1 mg via INTRAVENOUS

## 2019-04-28 MED ORDER — PREDNISONE 20 MG PO TABS: 50.0000 mg | ORAL_TABLET | Freq: Four times a day (QID) | ORAL | Status: AC

## 2019-04-28 MED ORDER — VERAPAMIL HCL 2.5 MG/ML IV SOLN
INTRAVENOUS | Status: AC
  Filled 2019-04-28: qty 2

## 2019-04-28 MED ORDER — HEPARIN (PORCINE) 25000 UT/250ML-% IV SOLN
900.0000 [IU]/h | INTRAVENOUS | Status: DC
  Administered 2019-04-28: 900 [IU]/h via INTRAVENOUS

## 2019-04-28 MED ORDER — IOHEXOL 350 MG/ML SOLN
INTRAVENOUS | Status: DC | PRN
  Administered 2019-04-28: 100 mL via INTRACARDIAC

## 2019-04-28 MED ORDER — HEPARIN SODIUM (PORCINE) 1000 UNIT/ML IJ SOLN
INTRAMUSCULAR | Status: DC | PRN
  Administered 2019-04-28: 3000 [IU] via INTRAVENOUS

## 2019-04-28 MED ORDER — LIDOCAINE HCL (PF) 1 % IJ SOLN
INTRAMUSCULAR | Status: AC
  Filled 2019-04-28: qty 30

## 2019-04-28 MED ORDER — SODIUM CHLORIDE 0.9% FLUSH
3.0000 mL | Freq: Two times a day (BID) | INTRAVENOUS | Status: DC
  Administered 2019-04-29: 3 mL via INTRAVENOUS
  Administered 2019-04-29: 10 mL via INTRAVENOUS

## 2019-04-28 MED ORDER — DIPHENHYDRAMINE HCL 50 MG/ML IJ SOLN: 50.0000 mg | Freq: Once | INTRAMUSCULAR | Status: AC

## 2019-04-28 MED ORDER — PREDNISONE 20 MG PO TABS
50.0000 mg | ORAL_TABLET | Freq: Four times a day (QID) | ORAL | Status: AC
  Administered 2019-04-28 – 2019-04-29 (×3): 50 mg via ORAL
  Filled 2019-04-28 (×3): qty 2

## 2019-04-28 MED ORDER — ASPIRIN 81 MG PO CHEW
81.0000 mg | CHEWABLE_TABLET | ORAL | Status: AC
  Administered 2019-04-29: 81 mg via ORAL
  Filled 2019-04-28: qty 1

## 2019-04-28 MED ORDER — SODIUM CHLORIDE 0.9 % WEIGHT BASED INFUSION: 1.0000 mL/kg/h | INTRAVENOUS | Status: DC

## 2019-04-28 MED ORDER — MIDAZOLAM HCL 2 MG/2ML IJ SOLN
INTRAMUSCULAR | Status: AC
  Filled 2019-04-28: qty 2

## 2019-04-28 MED ORDER — SODIUM CHLORIDE 0.9 % IV SOLN
INTRAVENOUS | Status: AC
  Administered 2019-04-28: 13:00:00 via INTRAVENOUS

## 2019-04-28 MED ORDER — ONDANSETRON HCL 4 MG/2ML IJ SOLN: 4.0000 mg | Freq: Four times a day (QID) | INTRAMUSCULAR | Status: DC | PRN

## 2019-04-28 MED ORDER — SODIUM CHLORIDE 0.9 % WEIGHT BASED INFUSION
3.0000 mL/kg/h | INTRAVENOUS | Status: DC
  Administered 2019-04-29: 3 mL/kg/h via INTRAVENOUS

## 2019-04-28 MED ORDER — HEPARIN SODIUM (PORCINE) 1000 UNIT/ML IJ SOLN
INTRAMUSCULAR | Status: AC
  Filled 2019-04-28: qty 1

## 2019-04-28 SURGICAL SUPPLY — 11 items
CATH DXT MULTI JL4 JR4 ANG PIG (CATHETERS) ×2 IMPLANT
CATH INFINITI 5FR AL1 (CATHETERS) ×2 IMPLANT
DEVICE RAD TR BAND REGULAR (VASCULAR PRODUCTS) ×2 IMPLANT
GLIDESHEATH SLEND A-KIT 6F 22G (SHEATH) ×2 IMPLANT
GUIDEWIRE INQWIRE 1.5J.035X260 (WIRE) ×1 IMPLANT
INQWIRE 1.5J .035X260CM (WIRE) ×2
KIT HEART LEFT (KITS) ×2 IMPLANT
PACK CARDIAC CATHETERIZATION (CUSTOM PROCEDURE TRAY) ×2 IMPLANT
SHEATH PROBE COVER 6X72 (BAG) ×2 IMPLANT
TRANSDUCER W/STOPCOCK (MISCELLANEOUS) ×2 IMPLANT
TUBING CIL FLEX 10 FLL-RA (TUBING) ×2 IMPLANT

## 2019-04-28 NOTE — Progress Notes (Signed)
Continued chaplain care.  Found Claudia Howell had just returned from a procedure and was too groggy to carry on a conversation.  She did confirm Monsignor came yesterday and brought the sacraments.  She asked me to come back later on when she is more alert. Chaplain will continue to offer a ministry of presence and prayer with Claudia Howell.  De Burrs Chaplain Resident

## 2019-04-28 NOTE — Interval H&P Note (Signed)
Cath Lab Visit (complete for each Cath Lab visit)  Clinical Evaluation Leading to the Procedure:   ACS: Yes.    Non-ACS:    Anginal Classification: CCS IV  Anti-ischemic medical therapy: Maximal Therapy (2 or more classes of medications)  Non-Invasive Test Results: No non-invasive testing performed  Prior CABG: Previous CABG      History and Physical Interval Note:  04/28/2019 9:33 AM  [name]  has presented today for surgery, with the diagnosis of Chest Pain.  The various methods of treatment have been discussed with the patient and family. After consideration of risks, benefits and other options for treatment, the patient has consented to  Procedure(s): LEFT HEART CATH AND CORS/GRAFTS ANGIOGRAPHY (N/A) as a surgical intervention.  The patient's history has been reviewed, patient examined, no change in status, stable for surgery.  I have reviewed the patient's chart and labs.  Questions were answered to the patient's satisfaction.     Claudia Howell

## 2019-04-28 NOTE — Progress Notes (Addendum)
Progress Note  Patient Name: Claudia Howell Date of Encounter: 04/28/2019  Primary Cardiologist: Sinclair Grooms, MD   Subjective   Plan for CTO PCI today with Dr. Tamala Julian. No recurrent CP or SOB.   Inpatient Medications    Scheduled Meds: . angioplasty book   Does not apply Once  . aspirin EC  81 mg Oral Daily  . atenolol  100 mg Oral Daily  . benazepril  40 mg Oral Daily  . cholecalciferol  1,000 Units Oral Daily  . clopidogrel  75 mg Oral Q breakfast  . diphenhydrAMINE  50 mg Intravenous Once   Or  . diphenhydrAMINE  50 mg Oral Once  . hydrochlorothiazide  25 mg Oral Daily  . multivitamin with minerals  1 tablet Oral Daily  . rosuvastatin  10 mg Oral q1800  . sodium chloride flush  3 mL Intravenous Q12H  . sodium chloride flush  3 mL Intravenous Q12H   Continuous Infusions: . sodium chloride    . sodium chloride    . sodium chloride 1 mL/kg/hr (04/27/19 2206)  . heparin 900 Units/hr (04/27/19 2215)  . nitroGLYCERIN 20 mcg/min (04/28/19 0624)   PRN Meds: sodium chloride, sodium chloride, acetaminophen, nitroGLYCERIN, ondansetron (ZOFRAN) IV, senna, sodium chloride flush, sodium chloride flush   Vital Signs    Vitals:   04/27/19 2227 04/28/19 0028 04/28/19 0354 04/28/19 0653  BP: 131/70 (!) 140/58 (!) 183/81 (!) 156/58  Pulse:  66 74   Resp: 16 18 18    Temp: 98 F (36.7 C) 97.9 F (36.6 C) 97.7 F (36.5 C)   TempSrc: Oral Oral Oral   SpO2: 96% 97% 98%   Weight:      Height:        Intake/Output Summary (Last 24 hours) at 04/28/2019 0747 Last data filed at 04/28/2019 0500 Gross per 24 hour  Intake 958.29 ml  Output -  Net 958.29 ml   Last 3 Weights 04/27/2019 04/26/2019 04/25/2019  Weight (lbs) 130 lb 6.4 oz 132 lb 12.8 oz 134 lb 3.2 oz  Weight (kg) 59.149 kg 60.238 kg 60.873 kg      Telemetry    NSR, HR 60-70s, occasional PACs and PVCs - Personally Reviewed  ECG    No new - Personally Reviewed  Physical Exam   GEN: No acute distress.    Neck: No JVD Cardiac: RRR, no murmurs, rubs, or gallops.  Respiratory: Clear to auscultation bilaterally. GI: Soft, nontender, non-distended  MS: No edema; No deformity. Neuro:  Nonfocal  Psych: Normal affect   Labs    High Sensitivity Troponin:   Recent Labs  Lab 04/24/19 1023 04/24/19 1205 04/24/19 1450 04/24/19 1701  TROPONINIHS 10 75* 383* 744*      Chemistry Recent Labs  Lab 04/24/19 1023  04/26/19 0431 04/27/19 0523 04/28/19 0347  NA 133*   < > 135 134* 134*  K 3.2*   < > 3.4* 3.7 3.5  CL 96*   < > 102 102 104  CO2 22   < > 23 21* 19*  GLUCOSE 127*   < > 104* 109* 174*  BUN 14   < > 19 22 19   CREATININE 0.83   < > 0.85 0.88 0.83  CALCIUM 9.1   < > 9.1 9.0 9.2  PROT 7.5  --   --   --   --   ALBUMIN 3.9  --   --   --   --   AST 21  --   --   --   --  ALT 15  --   --   --   --   ALKPHOS 47  --   --   --   --   BILITOT 0.8  --   --   --   --   GFRNONAA >60   < > >60 >60 >60  GFRAA >60   < > >60 >60 >60  ANIONGAP 15   < > 10 11 11    < > = values in this interval not displayed.     Hematology Recent Labs  Lab 04/26/19 0431 04/27/19 0523 04/28/19 0347  WBC 8.1 7.5 6.9  RBC 4.05 3.90 3.82*  HGB 12.7 12.3 12.0  HCT 37.9 36.5 35.7*  MCV 93.6 93.6 93.5  MCH 31.4 31.5 31.4  MCHC 33.5 33.7 33.6  RDW 12.2 12.2 12.1  PLT 161 184 199    BNP Recent Labs  Lab 04/24/19 1023  BNP 155.8*     DDimer No results for input(s): DDIMER in the last 168 hours.   Radiology    No results found.  Cardiac Studies   Left Heart Cath 01/01/19  Total occlusion of the saphenous vein graft to the right coronary.  Native RCA is severely diseased with ostial 60%, proximal to mid 75%, and mid 99%. There is distal 90% beyond the origin of the PDA and the continuation. Faint left to right collaterals are noted.  Failed angioplasty of the native right coronary due to inability to cross the severe mid vessel stenosis with a wire. (wire escalation was used).  Patent  LIMA to LAD, and patent saphenous vein graft to the circumflex marginal.  90% mid LAD and diffuse 50% proximal LAD stenosis. Small first diagonal contains 75% ostial stenosis.  Total occlusion of the proximal circumflex.  Normal left ventricular function. LVEDP less than 15. EF 55%.   RECOMMENDATIONS:   Continue medical therapy for control of angina. This will include beta-blocker and long-acting nitrate therapy.  Encouraging that collaterals are noted and hopefully will improve with time.  Consult CTO team to determine if this lesion could be treated as a CTO.  Continue Plavix for now.  Bedrest for 4 hours following Mynx. Discharge later today if stable      Echo 04/26/19 1. Left ventricular ejection fraction, by visual estimation, is 55 to 60%. The left ventricle has normal function. There is no left ventricular hypertrophy. 2. Left ventricular diastolic parameters are consistent with Grade II diastolic dysfunction (pseudonormalization). 3. Global right ventricle has normal systolic function.The right ventricular size is normal. No increase in right ventricular wall thickness. 4. Left atrial size was mildly dilated. 5. Right atrial size was mildly dilated. 6. The mitral valve is normal in structure. Mild mitral valve regurgitation. No evidence of mitral stenosis. 7. The tricuspid valve is normal in structure. Tricuspid valve regurgitation is trivial. 8. The aortic valve is tricuspid. Aortic valve regurgitation is not visualized. No evidence of aortic valve sclerosis or stenosis. 9. The pulmonic valve was normal in structure. Pulmonic valve regurgitation is not visualized. 10. The inferior vena cava is normal in size with greater than 50% respiratory variability, suggesting right atrial pressure of 3 mmHg.   Patient Profile     78 y.o. female with CAD s/p CABG  2014 with maze procedure, SVG stent 2014, HTN, chronic combined systolic and diastolic HF,  paroxysmal afib s/p Maze procedure, and recent cath in July 2020 showing severe disease in the RCA, patent graft, 90% lesion beyond PDA origin with inability to  cross the severe mid vessel stenosis with a wire. Medical management was continued at that time. Patient presented 04/24/19 for chest pain and tentative plan for PCI on Wednesday with Dr. Martinique.   Assessment & Plan      Unstable Angina/CAD s/p CABG 2014 - Heart cath in July 2020 showing severe disease in the RCA, 90% lesion beyond PDA origin with inability to cross the severe mid vessel stenosis with a wire, patent graft. Medical management was continued at that time. - Presented 04/24/19 with unstable angina, relieved with NTG, placed on NTG drip - Plan for CTO PCI to RCA today with Dr. Tamala Julian. Has been receiving steroids for contras allergy.  Risks and benefits of cardiac catheterization have been discussed with the patient.  These include bleeding, infection, kidney damage, stroke, heart attack, death.  The patient understands these risks and is willing to proceed. - Echo this admission showed EF 55-60%, G1DD - Denies recurrent CP and SOB - continue ASA and Plavix - continue statin - continue NTG drip for now. Pressures stable - Creatinine 0000000  Chronic diastolic HF - Patient takes Lasix 20 mg as needed - Pt does not appear to be volume overloaded  Paroxysmal Afib s/p maze procedure in 2014 - maintaining NSR  HTN - HCTZ 25 mg, Atenolol 100 mg daily, benazepril 40 mg daily - creatinine  Hyperlipidemia - LDL 90 this admission.  - Previous notes suggest she refused PCSK9-I in the past and has had some intolerance to statins. - Lipitor was changed to Rosuvastatin 10mg    For questions or updates, please contact [facility] Please consult [email or web address] for contact info under        Signed, Cadence Ninfa Meeker, PA-C  04/28/2019, 7:47 AM    I have seen and examined the patient along with Cadence Ninfa Meeker, PA-C .  I  have reviewed the chart, notes and new data.  I agree with PA/NP's note.  Key new complaints: seen immediately after diagnostic cath. No angina, no problems at access site. Key examination changes: right radial TR band in place, no bleeding.  Key new findings / data: Left heart catheterization with bypass graft angiography and LV hemodynamics via left radial using real-time vascular ultrasound for access.  Anatomy is unchanged from the July 2020 study.  The LIMA and obtuse marginal grafts are patent.  The native left coronary system is unchanged.  The graft to the right coronary is occluded and was not injected.  The native right coronary has short total occlusion in the mid segment that I was unable to cross on the last study.  LVEDP is normal. Plan PCI right coronary with Dr. Martinique tomorrow who may need to use CTO tactics if he is unable to cross primarily.  PLAN: Elective CTO-PCI in AM. Will give contrast allergy steroids/antihistamine again.  Sanda Klein, MD, [place] [phone] 04/28/2019, 11:40 AM

## 2019-04-28 NOTE — CV Procedure (Signed)
   Left heart catheterization with bypass graft angiography and LV hemodynamics via left radial using real-time vascular ultrasound for access.  Anatomy is unchanged from the July 2020 study.  The LIMA and obtuse marginal grafts are patent.  The native left coronary system is unchanged.  The graft to the right coronary is occluded and was not injected.  The native right coronary has short total occlusion in the mid segment that I was unable to cross on the last study.  LVEDP is normal.  Plan PCI right coronary with Dr. Martinique tomorrow who may need to use CTO tactics if he is unable to cross primarily.

## 2019-04-28 NOTE — Progress Notes (Signed)
ANTICOAGULATION CONSULT NOTE  Pharmacy Consult for Heparin Indication: chest pain/ACS  Patient Measurements: Height: 5\' 7"  (170.2 cm)(per patient report) Weight: 130 lb 6.4 oz (59.1 kg) IBW/kg (Calculated) : 61.6 Heparin Dosing Weight: 61 kg  Vital Signs: Temp: 97.7 F (36.5 C) (11/03 0354) Temp Source: Oral (11/03 0354) BP: 169/83 (11/03 1012) Pulse Rate: 0 (11/03 1013)  Labs: Recent Labs    04/26/19 0431 04/27/19 0523 04/28/19 0347  HGB 12.7 12.3 12.0  HCT 37.9 36.5 35.7*  PLT 161 184 199  HEPARINUNFRC 0.50 0.41 0.45  CREATININE 0.85 0.88 0.83    Estimated Creatinine Clearance: 53.8 mL/min (by C-G formula based on SCr of 0.83 mg/dL).   Assessment: 78 y/o female with PMH of CAD s/p CABG 2014 with maze procedure, SVG stent 2014, HTN, systoic and diastolic heart failure, and paroxysmal atrial fibrillation. Patient has been having angina symptoms for some time now and presents with a chief complaint of chest pain. Not no anticoagulation prior to admission. Pharmacy has been consulted to dose heparin. He is now s/p cath for PCI to RCA on 10/4  Pharmacy to restart heparin 8 hours post sheath removal  Goal of Therapy:  Heparin level 0.3-0.7 units/ml Monitor platelets by anticoagulation protocol: Yes   Plan:  Restart heparin IV at 900 units/hour at 6:30pm Daily HL and CBC  Hildred Laser, PharmD Clinical Pharmacist **Pharmacist phone directory can now be found on [email or web address] ([email or web address]).  Listed under [facility].  \

## 2019-04-29 ENCOUNTER — Encounter (HOSPITAL_COMMUNITY)
Admission: EM | Disposition: A | Discharge status: Home / Self Care | Payer: Self-pay | Source: Home / Self Care | Attending: Internal Medicine

## 2019-04-29 DIAGNOSIS — I2582 Chronic total occlusion of coronary artery: Secondary | ICD-10-CM

## 2019-04-29 HISTORY — PX: CORONARY CTO INTERVENTION: CATH118236

## 2019-04-29 HISTORY — PX: CORONARY ATHERECTOMY: CATH118238

## 2019-04-29 LAB — BASIC METABOLIC PANEL
Anion gap: 12 (ref 5–15)
BUN: 23 mg/dL (ref 8–23)
CO2: 19 mmol/L — ABNORMAL LOW (ref 22–32)
Calcium: 9.1 mg/dL (ref 8.9–10.3)
Chloride: 104 mmol/L (ref 98–111)
Creatinine, Ser: 0.84 mg/dL (ref 0.44–1.00)
GFR calc Af Amer: >60 mL/min (ref >60)
GFR calc non Af Amer: >60 mL/min (ref >60)
Glucose, Bld: 136 mg/dL — ABNORMAL HIGH (ref 70–99)
Potassium: 3.9 mmol/L (ref 3.5–5.1)
Sodium: 135 mmol/L (ref 135–145)

## 2019-04-29 LAB — CBC
HCT: 34.4 % — ABNORMAL LOW (ref 36.0–46.0)
Hemoglobin: 11.7 g/dL — ABNORMAL LOW (ref 12.0–15.0)
MCH: 31.4 pg (ref 26.0–34.0)
MCHC: 34 g/dL (ref 30.0–36.0)
MCV: 92.2 fL (ref 80.0–100.0)
Platelets: 202 10*3/uL (ref 150–400)
RBC: 3.73 MIL/uL — ABNORMAL LOW (ref 3.87–5.11)
RDW: 12.2 % (ref 11.5–15.5)
WBC: 15.6 10*3/uL — ABNORMAL HIGH (ref 4.0–10.5)
nRBC: 0 % (ref 0.0–0.2)

## 2019-04-29 LAB — POCT ACTIVATED CLOTTING TIME
Activated Clotting Time: 412 seconds
Activated Clotting Time: 252 seconds
Activated Clotting Time: 296 seconds
Activated Clotting Time: 433 seconds
Activated Clotting Time: 263 seconds
Activated Clotting Time: 202 seconds
Activated Clotting Time: 164 seconds

## 2019-04-29 LAB — HEPARIN LEVEL (UNFRACTIONATED): Heparin Unfractionated: 0.29 IU/mL — ABNORMAL LOW (ref 0.30–0.70)

## 2019-04-29 SURGERY — CORONARY CTO INTERVENTION
Anesthesia: LOCAL

## 2019-04-29 SURGERY — CORONARY STENT INTERVENTION
Anesthesia: LOCAL

## 2019-04-29 MED ORDER — VERAPAMIL HCL 2.5 MG/ML IV SOLN
INTRAVENOUS | Status: AC
  Filled 2019-04-29: qty 4

## 2019-04-29 MED ORDER — SODIUM CHLORIDE 0.9 % IV SOLN
INTRAVENOUS | Status: AC
  Administered 2019-04-29: 13:00:00 via INTRAVENOUS

## 2019-04-29 MED ORDER — ACETAMINOPHEN 325 MG PO TABS: 650.0000 mg | ORAL_TABLET | ORAL | Status: DC | PRN

## 2019-04-29 MED ORDER — HYDRALAZINE HCL 20 MG/ML IJ SOLN
INTRAMUSCULAR | Status: DC | PRN
  Administered 2019-04-29 (×2): 10 mg via INTRAVENOUS

## 2019-04-29 MED ORDER — ASPIRIN 81 MG PO CHEW
81.0000 mg | CHEWABLE_TABLET | Freq: Every day | ORAL | Status: DC
  Administered 2019-04-30: 81 mg via ORAL
  Filled 2019-04-29: qty 1

## 2019-04-29 MED ORDER — NITROGLYCERIN 1 MG/10 ML FOR IR/CATH LAB
INTRA_ARTERIAL | Status: DC | PRN
  Administered 2019-04-29 (×2): 200 ug via INTRACORONARY

## 2019-04-29 MED ORDER — FENTANYL CITRATE (PF) 100 MCG/2ML IJ SOLN
50.0000 ug | Freq: Once | INTRAMUSCULAR | Status: AC
  Administered 2019-04-29: 50 ug via INTRAVENOUS

## 2019-04-29 MED ORDER — LABETALOL HCL 5 MG/ML IV SOLN: 10.0000 mg | INTRAVENOUS | Status: AC | PRN

## 2019-04-29 MED ORDER — SODIUM CHLORIDE 0.9 % IV SOLN: 250.0000 mL | INTRAVENOUS | Status: DC | PRN

## 2019-04-29 MED ORDER — SODIUM CHLORIDE 0.9% FLUSH
3.0000 mL | Freq: Two times a day (BID) | INTRAVENOUS | Status: DC
  Administered 2019-04-29 – 2019-04-30 (×2): 3 mL via INTRAVENOUS

## 2019-04-29 MED ORDER — HEPARIN (PORCINE) IN NACL 1000-0.9 UT/500ML-% IV SOLN
INTRAVENOUS | Status: AC
  Filled 2019-04-29: qty 1500

## 2019-04-29 MED ORDER — HYDRALAZINE HCL 20 MG/ML IJ SOLN: 10.0000 mg | INTRAMUSCULAR | Status: AC | PRN

## 2019-04-29 MED ORDER — MIDAZOLAM HCL 2 MG/2ML IJ SOLN
INTRAMUSCULAR | Status: AC
  Filled 2019-04-29: qty 2

## 2019-04-29 MED ORDER — NITROGLYCERIN 1 MG/10 ML FOR IR/CATH LAB
INTRA_ARTERIAL | Status: AC
  Filled 2019-04-29: qty 10

## 2019-04-29 MED ORDER — CLOPIDOGREL BISULFATE 75 MG PO TABS
75.0000 mg | ORAL_TABLET | Freq: Every day | ORAL | Status: DC
  Administered 2019-04-30: 75 mg via ORAL
  Filled 2019-04-29: qty 1

## 2019-04-29 MED ORDER — LIDOCAINE HCL (PF) 1 % IJ SOLN
INTRAMUSCULAR | Status: AC
  Filled 2019-04-29: qty 30

## 2019-04-29 MED ORDER — FENTANYL CITRATE (PF) 100 MCG/2ML IJ SOLN
INTRAMUSCULAR | Status: AC
  Filled 2019-04-29: qty 2

## 2019-04-29 MED ORDER — SODIUM CHLORIDE 0.9% FLUSH: 3.0000 mL | INTRAVENOUS | Status: DC | PRN

## 2019-04-29 MED ORDER — IOHEXOL 350 MG/ML SOLN
INTRAVENOUS | Status: DC | PRN
  Administered 2019-04-29: 115 mL via INTRA_ARTERIAL

## 2019-04-29 MED ORDER — LIDOCAINE HCL (PF) 1 % IJ SOLN
INTRAMUSCULAR | Status: DC | PRN
  Administered 2019-04-29: 15 mL

## 2019-04-29 MED ORDER — ONDANSETRON HCL 4 MG/2ML IJ SOLN: 4.0000 mg | Freq: Four times a day (QID) | INTRAMUSCULAR | Status: DC | PRN

## 2019-04-29 MED ORDER — MIDAZOLAM HCL 2 MG/2ML IJ SOLN
INTRAMUSCULAR | Status: DC | PRN
  Administered 2019-04-29: 2 mg via INTRAVENOUS

## 2019-04-29 MED ORDER — HEPARIN SODIUM (PORCINE) 1000 UNIT/ML IJ SOLN
INTRAMUSCULAR | Status: AC
  Filled 2019-04-29: qty 1

## 2019-04-29 MED ORDER — HEPARIN SODIUM (PORCINE) 1000 UNIT/ML IJ SOLN
INTRAMUSCULAR | Status: DC | PRN
  Administered 2019-04-29: 3000 [IU] via INTRAVENOUS
  Administered 2019-04-29: 4000 [IU] via INTRAVENOUS
  Administered 2019-04-29: 6000 [IU] via INTRAVENOUS

## 2019-04-29 MED ORDER — VERAPAMIL HCL 2.5 MG/ML IV SOLN
INTRA_ARTERIAL | Status: DC | PRN
  Administered 2019-04-29: 12:00:00 via INTRACORONARY

## 2019-04-29 MED ORDER — HEPARIN (PORCINE) IN NACL 1000-0.9 UT/500ML-% IV SOLN
INTRAVENOUS | Status: DC | PRN
  Administered 2019-04-29 (×2): 500 mL

## 2019-04-29 MED ORDER — HYDRALAZINE HCL 20 MG/ML IJ SOLN
INTRAMUSCULAR | Status: AC
  Filled 2019-04-29: qty 1

## 2019-04-29 MED ORDER — FENTANYL CITRATE (PF) 100 MCG/2ML IJ SOLN
INTRAMUSCULAR | Status: DC | PRN
  Administered 2019-04-29 (×3): 25 ug via INTRAVENOUS

## 2019-04-29 SURGICAL SUPPLY — 34 items
BALLN EMERGE MR 2.25X8 (BALLOONS) ×2
BALLN EMERGE MR PUSH 1.20X12 (BALLOONS) ×2
BALLN EMERGE MR PUSH 1.5X15 (BALLOONS) ×2
BALLN ~~LOC~~ EMERGE MR 3.0X20 (BALLOONS) ×2
BALLOON EMERGE MR 2.25X8 (BALLOONS) IMPLANT
BALLOON EMERGE MR PUSH 1.20X12 (BALLOONS) IMPLANT
BALLOON EMERGE MR PUSH 1.5X15 (BALLOONS) IMPLANT
BALLOON ~~LOC~~ EMERGE MR 3.0X20 (BALLOONS) IMPLANT
BUR ROTAPRO CONNECT 1.25 (BURR) IMPLANT
BURR ROTAPRO CNCT ADVNCR 1.25 (BURR) ×1
BURR ROTAPRO CONNECT 1.25 (BURR) ×1
CATH TRAPPER 6-8F (CATHETERS) ×1 IMPLANT
CATH TURNPIKE 135CM (CATHETERS) ×1 IMPLANT
ELECT DEFIB PAD ADLT CADENCE (PAD) ×1 IMPLANT
GUIDE CATH MACH 1 7F AL.75 (CATHETERS) ×1 IMPLANT
GUIDELINER 6F (CATHETERS) ×1 IMPLANT
KIT ENCORE 26 ADVANTAGE (KITS) ×1 IMPLANT
KIT HEART LEFT (KITS) ×2 IMPLANT
KIT HEMO VALVE WATCHDOG (MISCELLANEOUS) ×1 IMPLANT
LUBRICANT ROTAGLIDE 20CC VIAL (MISCELLANEOUS) ×1 IMPLANT
PACK CARDIAC CATHETERIZATION (CUSTOM PROCEDURE TRAY) ×2 IMPLANT
PINNACLE LONG 7F 25CM (SHEATH) ×2
SHEATH INTRO PINNACLE 7F 25CM (SHEATH) IMPLANT
SHEATH PINNACLE 7F 10CM (SHEATH) ×1 IMPLANT
SHEATH PROBE COVER 6X72 (BAG) ×1 IMPLANT
STENT SYNERGY DES 2.5X38 (Permanent Stent) ×1 IMPLANT
STENT SYNERGY DES 2.75X16 (Permanent Stent) ×1 IMPLANT
TRANSDUCER W/STOPCOCK (MISCELLANEOUS) ×2 IMPLANT
TUBING CIL FLEX 10 FLL-RA (TUBING) ×2 IMPLANT
WIRE ASAHI PROWATER 180CM (WIRE) ×2 IMPLANT
WIRE EMERALD 3MM-J .035X150CM (WIRE) ×1 IMPLANT
WIRE FIGHTER CROSSING 190CM (WIRE) ×1 IMPLANT
WIRE ROTA FLOPPY .009X325CM (WIRE) ×1 IMPLANT
WIRE STRETCH EXTENSION (WIRE) ×1 IMPLANT

## 2019-04-29 NOTE — Interval H&P Note (Signed)
History and Physical Interval Note:  04/29/2019 9:21 AM  Claudia Howell  has presented today for surgery, with the diagnosis of cad.  The various methods of treatment have been discussed with the patient and family. After consideration of risks, benefits and other options for treatment, the patient has consented to  Procedure(s): CORONARY CTO INTERVENTION (N/A) as a surgical intervention.  The patient's history has been reviewed, patient examined, no change in status, stable for surgery.  I have reviewed the patient's chart and labs.  Questions were answered to the patient's satisfaction.   Cath Lab Visit (complete for each Cath Lab visit)  Clinical Evaluation Leading to the Procedure:   ACS: Yes.    Non-ACS:    Anginal Classification: CCS IV  Anti-ischemic medical therapy: Maximal Therapy (2 or more classes of medications)  Non-Invasive Test Results: No non-invasive testing performed  Prior CABG: Previous CABG        Claudia Howell [facility] 04/29/2019 9:21 AM

## 2019-04-29 NOTE — Progress Notes (Signed)
EKG obtained after returning from lunch with a change in rhythm  Patient in AFIB RVR with a rate of 120-100's Dr Martinique made aware and given EKG.  No new orders at this time.

## 2019-04-29 NOTE — H&P (View-Only) (Signed)
Progress Note  Patient Name: Claudia Howell Date of Encounter: 04/29/2019  Primary Cardiologist: Sinclair Grooms, MD   Subjective   Cath today with Dr. Martinique. No chest pain or SOB.   Inpatient Medications    Scheduled Meds: . angioplasty book   Does not apply Once  . aspirin EC  81 mg Oral Daily  . atenolol  100 mg Oral Daily  . benazepril  40 mg Oral Daily  . cholecalciferol  1,000 Units Oral Daily  . clopidogrel  75 mg Oral Q breakfast  . diphenhydrAMINE  50 mg Oral Once   Or  . diphenhydrAMINE  50 mg Intravenous Once  . diphenhydrAMINE  50 mg Oral Once   Or  . diphenhydrAMINE  50 mg Intravenous Once  . hydrochlorothiazide  25 mg Oral Daily  . multivitamin with minerals  1 tablet Oral Daily  . predniSONE  50 mg Oral Q6H  . predniSONE  50 mg Oral Q6H  . rosuvastatin  10 mg Oral q1800  . sodium chloride flush  3 mL Intravenous Q12H  . sodium chloride flush  3 mL Intravenous Q12H  . sodium chloride flush  3 mL Intravenous Q12H  . sodium chloride flush  3 mL Intravenous Q12H   Continuous Infusions: . sodium chloride    . sodium chloride    . sodium chloride 1 mL/kg/hr (04/29/19 0519)  . heparin 900 Units/hr (04/28/19 1923)  . nitroGLYCERIN 5 mcg/min (04/29/19 0040)   PRN Meds: sodium chloride, sodium chloride, acetaminophen, nitroGLYCERIN, ondansetron (ZOFRAN) IV, senna, sodium chloride flush, sodium chloride flush, sodium chloride flush   Vital Signs    Vitals:   04/28/19 2200 04/29/19 0035 04/29/19 0333 04/29/19 0457  BP: 125/73 (!) 115/58  (!) 156/69  Pulse:  69  69  Resp:  18  18  Temp:  97.9 F (36.6 C)  97.9 F (36.6 C)  TempSrc:  Oral  Oral  SpO2:  98%  97%  Weight:   61.1 kg   Height:       No intake or output data in the 24 hours ending 04/29/19 0631 Last 3 Weights 04/29/2019 04/27/2019 04/26/2019  Weight (lbs) 134 lb 9.6 oz 130 lb 6.4 oz 132 lb 12.8 oz  Weight (kg) 61.054 kg 59.149 kg 60.238 kg      Telemetry    NSR, HR 60-70s -  Personally Reviewed  ECG    No new - Personally Reviewed  Physical Exam   GEN: No acute distress.   Neck: No JVD Cardiac: RRR, no murmurs, rubs, or gallops.  Respiratory: Clear to auscultation bilaterally. GI: Soft, nontender, non-distended  MS: No edema; No deformity. Neuro:  Nonfocal  Psych: Normal affect   Labs    High Sensitivity Troponin:   Recent Labs  Lab 04/24/19 1023 04/24/19 1205 04/24/19 1450 04/24/19 1701  TROPONINIHS 10 75* 383* 744*      Chemistry Recent Labs  Lab 04/24/19 1023  04/27/19 0523 04/28/19 0347 04/28/19 1948  NA 133*   < > 134* 134* 131*  K 3.2*   < > 3.7 3.5 3.4*  CL 96*   < > 102 104 102  CO2 22   < > 21* 19* 19*  GLUCOSE 127*   < > 109* 174* 148*  BUN 14   < > 22 19 21   CREATININE 0.83   < > 0.88 0.83 0.87  CALCIUM 9.1   < > 9.0 9.2 9.0  PROT 7.5  --   --   --   --  ALBUMIN 3.9  --   --   --   --   AST 21  --   --   --   --   ALT 15  --   --   --   --   ALKPHOS 47  --   --   --   --   BILITOT 0.8  --   --   --   --   GFRNONAA >60   < > >60 >60 >60  GFRAA >60   < > >60 >60 >60  ANIONGAP 15   < > 11 11 10    < > = values in this interval not displayed.     Hematology Recent Labs  Lab 04/28/19 0347 04/28/19 1948 04/29/19 0309  WBC 6.9 16.0* 15.6*  RBC 3.82* 3.61* 3.73*  HGB 12.0 11.4* 11.7*  HCT 35.7* 33.7* 34.4*  MCV 93.5 93.4 92.2  MCH 31.4 31.6 31.4  MCHC 33.6 33.8 34.0  RDW 12.1 12.1 12.2  PLT 199 212 202    BNP Recent Labs  Lab 04/24/19 1023  BNP 155.8*     DDimer No results for input(s): DDIMER in the last 168 hours.   Radiology    No results found.  Cardiac Studies   Cardiac Cath 04/28/19  Left internal mammary to mid LAD is widely patent  Bypass graft obtuse marginal is widely patent including the stent in the distal graft.  The patient has known occlusion of the saphenous vein graft to the right coronary.  Left main is patent.  Mid LAD after a large diagonal contains 85% stenosis.   Competitive flow beyond this is noted due to the patent LIMA.  Native circumflex is totally occluded.  The right coronary contains diffuse disease with 85 to 90% calcified mid segment stenosis segmental 40% narrowing in the mid, followed by calcified focal mid stenosis that is angulated and does have a a channel.  I was unable to wire this successfully on the last procedure.  Normal LVEDP.  Echo this admission demonstrates normal systolic function.  RECOMMENDATIONS:   Attempt PCI of right coronary tomorrow by Dr. Martinique.  Hopefully he can cross the lesion primarily but may need CTO techniques on the mid right coronary.  N.p.o. after midnight.  Left Heart Cath 01/01/19  Total occlusion of the saphenous vein graft to the right coronary.  Native RCA is severely diseased with ostial 60%, proximal to mid 75%, and mid 99%. There is distal 90% beyond the origin of the PDA and the continuation. Faint left to right collaterals are noted.  Failed angioplasty of the native right coronary due to inability to cross the severe mid vessel stenosis with a wire. (wire escalation was used).  Patent LIMA to LAD, and patent saphenous vein graft to the circumflex marginal.  90% mid LAD and diffuse 50% proximal LAD stenosis. Small first diagonal contains 75% ostial stenosis.  Total occlusion of the proximal circumflex.  Normal left ventricular function. LVEDP less than 15. EF 55%.   RECOMMENDATIONS:   Continue medical therapy for control of angina. This will include beta-blocker and long-acting nitrate therapy.  Encouraging that collaterals are noted and hopefully will improve with time.  Consult CTO team to determine if this lesion could be treated as a CTO.  Continue Plavix for now.  Bedrest for 4 hours following Mynx. Discharge later today if stable      Echo 04/26/19 1. Left ventricular ejection fraction, by visual estimation, is 55 to 60%. The left  ventricle has normal  function. There is no left ventricular hypertrophy. 2. Left ventricular diastolic parameters are consistent with Grade II diastolic dysfunction (pseudonormalization). 3. Global right ventricle has normal systolic function.The right ventricular size is normal. No increase in right ventricular wall thickness. 4. Left atrial size was mildly dilated. 5. Right atrial size was mildly dilated. 6. The mitral valve is normal in structure. Mild mitral valve regurgitation. No evidence of mitral stenosis. 7. The tricuspid valve is normal in structure. Tricuspid valve regurgitation is trivial. 8. The aortic valve is tricuspid. Aortic valve regurgitation is not visualized. No evidence of aortic valve sclerosis or stenosis. 9. The pulmonic valve was normal in structure. Pulmonic valve regurgitation is not visualized. 10. The inferior vena cava is normal in size with greater than 50% respiratory variability, suggesting right atrial pressure of 3 mmHg.   Patient Profile     78 y.o. female with CAD s/p CABG 2014 with maze procedure, SVG stent 2014, HTN, chronic combined systolic and diastolic HF, paroxysmal afib s/p Maze procedure, and recent cath in July 2020 showing severe disease in the RCA, patent graft, 90% lesion beyond PDA origin with inability to cross the severe mid vessel stenosis with a wire. Medical management was continued at that time. Patient presented 04/24/19 for chest pain and had unsuccessful attempt at PCI yesterday with Dr. Tamala Julian; tentative plan for PCI today with Dr. Martinique.  Assessment & Plan    Unstable Angina/CAD s/p CABG 2014 - Heart cath in July 2020 showing severe disease in the RCA, 90% lesion beyond PDA origin with inability to cross the severe mid vessel stenosis with a wire, patent graft. Medical management was continued at that time. - Presented 04/24/19 with unstable angina, relieved with NTG, placed on NTG drip - Atempted CTO PCI by Dr. Tamala Julian yesterday. Plan for repeat  cardiac cath today with Dr. Martinique. Patient was premedicated for contrast allergy.  Risks and benefits of cardiac catheterization have been discussed with the patient.  These include bleeding, infection, kidney damage, stroke, heart attack, death.  The patient understands these risks and is willing to proceed. - Echo this admission showed EF 55-60%, G1DD - Denies recurrent CP and SOB - continue ASA and Plavix - continue statin - continue NTG drip for now. Pressures stable - Creatinine 0.84 - further recommendations per cath  Chronic diastolic HF - Patient takes Lasix 20 mg as needed - Pt does not appear to be volume overloaded  Paroxysmal Afib s/p maze procedure in 2014 - maintaining NSR  HTN - HCTZ 25 mg, Atenolol 100 mg daily, benazepril 40 mg daily - stable  Hyperlipidemia - LDL 90 this admission.  - Previous notes suggest she refused PCSK9-I in the past and has had some intolerance to statins. - Lipitor was changed to Rosuvastatin 10mg    For questions or updates, please contact [facility] Please consult [email or web address] for contact info under        Signed, Braden Deloach Ninfa Meeker, PA-C  04/29/2019, 6:31 AM

## 2019-04-29 NOTE — Progress Notes (Signed)
Site area- right  Site Prior to Removal- 1   Pressure Applied For-  20 MInutes   Bedrest Beginning at - 1605   Manual- Yes   Patient Status During Pull- Stable    Post Pull Groin Site- 1   Post Pull Instructions Given- Yes   Post Pull Pulses Present- Yes    Dressing Applied- Tegaderm and Gauze Dressing    Comments:  tol well

## 2019-04-29 NOTE — Progress Notes (Addendum)
Progress Note  Patient Name: Claudia Howell Date of Encounter: 04/29/2019  Primary Cardiologist: Sinclair Grooms, MD   Subjective   Cath today with Dr. Martinique. No chest pain or SOB.   Inpatient Medications    Scheduled Meds: . angioplasty book   Does not apply Once  . aspirin EC  81 mg Oral Daily  . atenolol  100 mg Oral Daily  . benazepril  40 mg Oral Daily  . cholecalciferol  1,000 Units Oral Daily  . clopidogrel  75 mg Oral Q breakfast  . diphenhydrAMINE  50 mg Oral Once   Or  . diphenhydrAMINE  50 mg Intravenous Once  . diphenhydrAMINE  50 mg Oral Once   Or  . diphenhydrAMINE  50 mg Intravenous Once  . hydrochlorothiazide  25 mg Oral Daily  . multivitamin with minerals  1 tablet Oral Daily  . predniSONE  50 mg Oral Q6H  . predniSONE  50 mg Oral Q6H  . rosuvastatin  10 mg Oral q1800  . sodium chloride flush  3 mL Intravenous Q12H  . sodium chloride flush  3 mL Intravenous Q12H  . sodium chloride flush  3 mL Intravenous Q12H  . sodium chloride flush  3 mL Intravenous Q12H   Continuous Infusions: . sodium chloride    . sodium chloride    . sodium chloride 1 mL/kg/hr (04/29/19 0519)  . heparin 900 Units/hr (04/28/19 1923)  . nitroGLYCERIN 5 mcg/min (04/29/19 0040)   PRN Meds: sodium chloride, sodium chloride, acetaminophen, nitroGLYCERIN, ondansetron (ZOFRAN) IV, senna, sodium chloride flush, sodium chloride flush, sodium chloride flush   Vital Signs    Vitals:   04/28/19 2200 04/29/19 0035 04/29/19 0333 04/29/19 0457  BP: 125/73 (!) 115/58  (!) 156/69  Pulse:  69  69  Resp:  18  18  Temp:  97.9 F (36.6 C)  97.9 F (36.6 C)  TempSrc:  Oral  Oral  SpO2:  98%  97%  Weight:   61.1 kg   Height:       No intake or output data in the 24 hours ending 04/29/19 0631 Last 3 Weights 04/29/2019 04/27/2019 04/26/2019  Weight (lbs) 134 lb 9.6 oz 130 lb 6.4 oz 132 lb 12.8 oz  Weight (kg) 61.054 kg 59.149 kg 60.238 kg      Telemetry    NSR, HR 60-70s -  Personally Reviewed  ECG    No new - Personally Reviewed  Physical Exam   GEN: No acute distress.   Neck: No JVD Cardiac: RRR, no murmurs, rubs, or gallops.  Respiratory: Clear to auscultation bilaterally. GI: Soft, nontender, non-distended  MS: No edema; No deformity. Neuro:  Nonfocal  Psych: Normal affect   Labs    High Sensitivity Troponin:   Recent Labs  Lab 04/24/19 1023 04/24/19 1205 04/24/19 1450 04/24/19 1701  TROPONINIHS 10 75* 383* 744*      Chemistry Recent Labs  Lab 04/24/19 1023  04/27/19 0523 04/28/19 0347 04/28/19 1948  NA 133*   < > 134* 134* 131*  K 3.2*   < > 3.7 3.5 3.4*  CL 96*   < > 102 104 102  CO2 22   < > 21* 19* 19*  GLUCOSE 127*   < > 109* 174* 148*  BUN 14   < > 22 19 21   CREATININE 0.83   < > 0.88 0.83 0.87  CALCIUM 9.1   < > 9.0 9.2 9.0  PROT 7.5  --   --   --   --  ALBUMIN 3.9  --   --   --   --   AST 21  --   --   --   --   ALT 15  --   --   --   --   ALKPHOS 47  --   --   --   --   BILITOT 0.8  --   --   --   --   GFRNONAA >60   < > >60 >60 >60  GFRAA >60   < > >60 >60 >60  ANIONGAP 15   < > 11 11 10    < > = values in this interval not displayed.     Hematology Recent Labs  Lab 04/28/19 0347 04/28/19 1948 04/29/19 0309  WBC 6.9 16.0* 15.6*  RBC 3.82* 3.61* 3.73*  HGB 12.0 11.4* 11.7*  HCT 35.7* 33.7* 34.4*  MCV 93.5 93.4 92.2  MCH 31.4 31.6 31.4  MCHC 33.6 33.8 34.0  RDW 12.1 12.1 12.2  PLT 199 212 202    BNP Recent Labs  Lab 04/24/19 1023  BNP 155.8*     DDimer No results for input(s): DDIMER in the last 168 hours.   Radiology    No results found.  Cardiac Studies   Cardiac Cath 04/28/19  Left internal mammary to mid LAD is widely patent  Bypass graft obtuse marginal is widely patent including the stent in the distal graft.  The patient has known occlusion of the saphenous vein graft to the right coronary.  Left main is patent.  Mid LAD after a large diagonal contains 85% stenosis.   Competitive flow beyond this is noted due to the patent LIMA.  Native circumflex is totally occluded.  The right coronary contains diffuse disease with 85 to 90% calcified mid segment stenosis segmental 40% narrowing in the mid, followed by calcified focal mid stenosis that is angulated and does have a a channel.  I was unable to wire this successfully on the last procedure.  Normal LVEDP.  Echo this admission demonstrates normal systolic function.  RECOMMENDATIONS:   Attempt PCI of right coronary tomorrow by Dr. Martinique.  Hopefully he can cross the lesion primarily but may need CTO techniques on the mid right coronary.  N.p.o. after midnight.  Left Heart Cath 01/01/19  Total occlusion of the saphenous vein graft to the right coronary.  Native RCA is severely diseased with ostial 60%, proximal to mid 75%, and mid 99%. There is distal 90% beyond the origin of the PDA and the continuation. Faint left to right collaterals are noted.  Failed angioplasty of the native right coronary due to inability to cross the severe mid vessel stenosis with a wire. (wire escalation was used).  Patent LIMA to LAD, and patent saphenous vein graft to the circumflex marginal.  90% mid LAD and diffuse 50% proximal LAD stenosis. Small first diagonal contains 75% ostial stenosis.  Total occlusion of the proximal circumflex.  Normal left ventricular function. LVEDP less than 15. EF 55%.   RECOMMENDATIONS:   Continue medical therapy for control of angina. This will include beta-blocker and long-acting nitrate therapy.  Encouraging that collaterals are noted and hopefully will improve with time.  Consult CTO team to determine if this lesion could be treated as a CTO.  Continue Plavix for now.  Bedrest for 4 hours following Mynx. Discharge later today if stable      Echo 04/26/19 1. Left ventricular ejection fraction, by visual estimation, is 55 to 60%. The left  ventricle has normal  function. There is no left ventricular hypertrophy. 2. Left ventricular diastolic parameters are consistent with Grade II diastolic dysfunction (pseudonormalization). 3. Global right ventricle has normal systolic function.The right ventricular size is normal. No increase in right ventricular wall thickness. 4. Left atrial size was mildly dilated. 5. Right atrial size was mildly dilated. 6. The mitral valve is normal in structure. Mild mitral valve regurgitation. No evidence of mitral stenosis. 7. The tricuspid valve is normal in structure. Tricuspid valve regurgitation is trivial. 8. The aortic valve is tricuspid. Aortic valve regurgitation is not visualized. No evidence of aortic valve sclerosis or stenosis. 9. The pulmonic valve was normal in structure. Pulmonic valve regurgitation is not visualized. 10. The inferior vena cava is normal in size with greater than 50% respiratory variability, suggesting right atrial pressure of 3 mmHg.   Patient Profile     78 y.o. female with CAD s/p CABG 2014 with maze procedure, SVG stent 2014, HTN, chronic combined systolic and diastolic HF, paroxysmal afib s/p Maze procedure, and recent cath in July 2020 showing severe disease in the RCA, patent graft, 90% lesion beyond PDA origin with inability to cross the severe mid vessel stenosis with a wire. Medical management was continued at that time. Patient presented 04/24/19 for chest pain and had unsuccessful attempt at PCI yesterday with Dr. Tamala Julian; tentative plan for PCI today with Dr. Martinique.  Assessment & Plan    Unstable Angina/CAD s/p CABG 2014 - Heart cath in July 2020 showing severe disease in the RCA, 90% lesion beyond PDA origin with inability to cross the severe mid vessel stenosis with a wire, patent graft. Medical management was continued at that time. - Presented 04/24/19 with unstable angina, relieved with NTG, placed on NTG drip - Atempted CTO PCI by Dr. Tamala Julian yesterday. Plan for repeat  cardiac cath today with Dr. Martinique. Patient was premedicated for contrast allergy.  Risks and benefits of cardiac catheterization have been discussed with the patient.  These include bleeding, infection, kidney damage, stroke, heart attack, death.  The patient understands these risks and is willing to proceed. - Echo this admission showed EF 55-60%, G1DD - Denies recurrent CP and SOB - continue ASA and Plavix - continue statin - continue NTG drip for now. Pressures stable - Creatinine 0.84 - further recommendations per cath  Chronic diastolic HF - Patient takes Lasix 20 mg as needed - Pt does not appear to be volume overloaded  Paroxysmal Afib s/p maze procedure in 2014 - maintaining NSR  HTN - HCTZ 25 mg, Atenolol 100 mg daily, benazepril 40 mg daily - stable  Hyperlipidemia - LDL 90 this admission.  - Previous notes suggest she refused PCSK9-I in the past and has had some intolerance to statins. - Lipitor was changed to Rosuvastatin 10mg    For questions or updates, please contact [facility] Please consult [email or web address] for contact info under        Signed, Cadence Ninfa Meeker, PA-C  04/29/2019, 6:31 AM    I have seen and examined the patient along with Cadence Ninfa Meeker, PA-C .  I have reviewed the chart, notes and new data.  I agree with PA/NP's note.  Key new complaints: did well w CTO-PCI, no angina or access site complications Key examination changes: no bleeding at access site Key new findings / data: angios reviewed w Dr. Martinique.   Prox RCA lesion is 75% stenosed.  A drug-eluting stent was successfully placed using a STENT SYNERGY DES 2.75X16.  RPAV lesion is 80% stenosed.  Mid RCA lesion is 99% stenosed.  A drug-eluting stent was successfully placed using a STENT SYNERGY DES 2.5X38.  Post intervention, there is a 0% residual stenosis.  Post intervention, there is a 0% residual stenosis.  Ost RCA lesion is 60% stenosed.  Post intervention, there is  a 0% residual stenosis.   1. Successful CTO PCI of RCA with Rotational atherectomy and DES x 2.   PLAN: DAPT for 12 months. Possible DC in AM if no complications overnight.  Sanda Klein, MD, [place] [phone] 04/29/2019, 1:57 PM

## 2019-04-29 NOTE — Progress Notes (Signed)
Patient taken to cath lab at 0930hrs.

## 2019-04-29 NOTE — Progress Notes (Signed)
Patient returned from cath lab at 1624hrs.  R femoral site level 1 with purple bruising and small firm area on arrival, checked with RN Peppers at bedside.  Patient given post cath instructions and advised BR until 2005hrs.  SR on monitor on arrival and noted to have left nare nose bleed.  Patient instructed to stop blowing nose.

## 2019-04-30 ENCOUNTER — Encounter (HOSPITAL_COMMUNITY): Payer: Self-pay | Admitting: Cardiology

## 2019-04-30 LAB — CBC
HCT: 30.1 % — ABNORMAL LOW (ref 36.0–46.0)
Hemoglobin: 10.1 g/dL — ABNORMAL LOW (ref 12.0–15.0)
MCH: 31.5 pg (ref 26.0–34.0)
MCHC: 33.6 g/dL (ref 30.0–36.0)
MCV: 93.8 fL (ref 80.0–100.0)
Platelets: 193 10*3/uL (ref 150–400)
RBC: 3.21 MIL/uL — ABNORMAL LOW (ref 3.87–5.11)
RDW: 12.8 % (ref 11.5–15.5)
WBC: 13.3 10*3/uL — ABNORMAL HIGH (ref 4.0–10.5)
nRBC: 0 % (ref 0.0–0.2)

## 2019-04-30 LAB — BASIC METABOLIC PANEL
Anion gap: 11 (ref 5–15)
BUN: 44 mg/dL — ABNORMAL HIGH (ref 8–23)
CO2: 20 mmol/L — ABNORMAL LOW (ref 22–32)
Calcium: 8.8 mg/dL — ABNORMAL LOW (ref 8.9–10.3)
Chloride: 106 mmol/L (ref 98–111)
Creatinine, Ser: 0.88 mg/dL (ref 0.44–1.00)
GFR calc Af Amer: >60 mL/min (ref >60)
GFR calc non Af Amer: >60 mL/min (ref >60)
Glucose, Bld: 100 mg/dL — ABNORMAL HIGH (ref 70–99)
Potassium: 3.3 mmol/L — ABNORMAL LOW (ref 3.5–5.1)
Sodium: 137 mmol/L (ref 135–145)

## 2019-04-30 LAB — TSH: TSH: 2.141 u[IU]/mL (ref 0.350–4.500)

## 2019-04-30 LAB — MAGNESIUM: Magnesium: 2 mg/dL (ref 1.7–2.4)

## 2019-04-30 MED ORDER — ROSUVASTATIN CALCIUM 10 MG PO TABS: 10.0000 mg | ORAL_TABLET | Freq: Every day | ORAL | 6 refills | Status: DC

## 2019-04-30 MED ORDER — CLOPIDOGREL BISULFATE 75 MG PO TABS: 75.0000 mg | ORAL_TABLET | Freq: Every day | ORAL | 3 refills | Status: DC

## 2019-04-30 MED ORDER — POTASSIUM CHLORIDE CRYS ER 20 MEQ PO TBCR
40.0000 meq | EXTENDED_RELEASE_TABLET | Freq: Once | ORAL | Status: AC
  Administered 2019-04-30: 40 meq via ORAL
  Filled 2019-04-30: qty 2

## 2019-04-30 NOTE — Care Management Important Message (Signed)
Important Message  Patient Details  Name: Claudia Howell MRN: [id number] Date of Birth: 09/21/1940   Medicare Important Message Given:  Yes     Shelda Altes 04/30/2019, 1:20 PM

## 2019-04-30 NOTE — Progress Notes (Signed)
CARDIAC REHAB PHASE I   Offered to walk with pt, pt states she has been ambulating in room without difficulty, and would prefer not having to walk with a mask on. Pt educated on importance of ASA, and Plavix. Pt given stent card and heart healthy diet. Reviewed restrictions, site care, and exercise guidelines. Will refer to CRP II [place]. Pt is interested in participating in Virtual Cardiac and Pulmonary Rehab. Pt advised that Virtual Cardiac and Pulmonary Rehab is provided at no cost to the patient.  Checklist:  1. Pt has smart device  ie smartphone and/or ipad for downloading an app  Yes 2. Reliable internet/wifi service    Yes 3. Understands how to use their smartphone and navigate within an app.  Yes  Pt verbalized understanding and is in agreement.  [id number] Rufina Falco, RN BSN 04/30/2019 9:08 AM

## 2019-04-30 NOTE — Discharge Summary (Signed)
Discharge Summary    Patient ID: Claudia Howell MRN: [id number]; DOB: [birth year]  Admit date: 04/24/2019 Discharge date: 04/30/2019  Primary Care Provider: Rita Ohara, MD  Primary Cardiologist: Sinclair Grooms, MD  Primary Electrophysiologist:  None   Discharge Diagnoses    Principal Problem:   NSTEMI (non-ST elevated myocardial infarction) [facility]) Active Problems:   Essential hypertension, benign   S/P CABG x 3   Atrial fibrillation ([place]) s/p maze procedure 2014   Hyperlipidemia   CAD (coronary artery disease), autologous vein bypass graft; CTO RCA DES x 2 04/29/19;    Chronic diastolic heart failure (HCC)   Unstable angina (HCC)   Elevated troponin    Diagnostic Studies/Procedures    Cardiac cath 04/29/19  Prox RCA lesion is 75% stenosed.  A drug-eluting stent was successfully placed using a STENT SYNERGY DES 2.75X16.  RPAV lesion is 80% stenosed.  Mid RCA lesion is 99% stenosed.  A drug-eluting stent was successfully placed using a STENT SYNERGY DES 2.5X38.  Post intervention, there is a 0% residual stenosis.  Post intervention, there is a 0% residual stenosis.  Ost RCA lesion is 60% stenosed.  Post intervention, there is a 0% residual stenosis.  1. Successful CTO PCI of RCA with Rotational atherectomy and DES x 2.   Plan: DAPT for at least one year. Will monitor overnight. Anticipate DC in am if no complications.   Cardiac Cath 04/28/19  Left internal mammary to mid LAD is widely patent  Bypass graft obtuse marginal is widely patent including the stent in the distal graft.  The patient has known occlusion of the saphenous vein graft to the right coronary.  Left main is patent. Mid LAD after a large diagonal contains 85% stenosis. Competitive flow beyond this is noted due to the patent LIMA.  Native circumflex is totally occluded.  The right coronary contains diffuse disease with 85 to 90% calcified mid segment stenosis segmental 40% narrowing in  the mid, followed by calcified focal mid stenosis that is angulated and does have a a channel. I was unable to wire this successfully on the last procedure.  Normal LVEDP. Echo this admission demonstrates normal systolic function.  RECOMMENDATIONS:   Attempt PCI of right coronary tomorrow by Dr. Martinique. Hopefully he can cross the lesion primarily but may need CTO techniques on the mid right coronary.  N.p.o. after midnight.  Left Heart Cath 01/01/19  Total occlusion of the saphenous vein graft to the right coronary.  Native RCA is severely diseased with ostial 60%, proximal to mid 75%, and mid 99%. There is distal 90% beyond the origin of the PDA and the continuation. Faint left to right collaterals are noted.  Failed angioplasty of the native right coronary due to inability to cross the severe mid vessel stenosis with a wire. (wire escalation was used).  Patent LIMA to LAD, and patent saphenous vein graft to the circumflex marginal.  90% mid LAD and diffuse 50% proximal LAD stenosis. Small first diagonal contains 75% ostial stenosis.  Total occlusion of the proximal circumflex.  Normal left ventricular function. LVEDP less than 15. EF 55%.   RECOMMENDATIONS:   Continue medical therapy for control of angina. This will include beta-blocker and long-acting nitrate therapy.  Encouraging that collaterals are noted and hopefully will improve with time.  Consult CTO team to determine if this lesion could be treated as a CTO.  Continue Plavix for now.  Bedrest for 4 hours following Mynx. Discharge later today if stable  Echo 04/26/19 1. Left ventricular ejection fraction, by visual estimation, is 55 to 60%. The left ventricle has normal function. There is no left ventricular hypertrophy. 2. Left ventricular diastolic parameters are consistent with Grade II diastolic dysfunction (pseudonormalization). 3. Global right ventricle has normal systolic  function.The right ventricular size is normal. No increase in right ventricular wall thickness. 4. Left atrial size was mildly dilated. 5. Right atrial size was mildly dilated. 6. The mitral valve is normal in structure. Mild mitral valve regurgitation. No evidence of mitral stenosis. 7. The tricuspid valve is normal in structure. Tricuspid valve regurgitation is trivial. 8. The aortic valve is tricuspid. Aortic valve regurgitation is not visualized. No evidence of aortic valve sclerosis or stenosis. 9. The pulmonic valve was normal in structure. Pulmonic valve regurgitation is not visualized. 10. The inferior vena cava is normal in size with greater than 50% respiratory variability, suggesting right atrial pressure of 3 mmHg.  _____________   History of Present Illness     Claudia Howell is a 78 y.o. female with with CAD s/p CABG, SVG stent 2014, paroxysmal afib s/p with maze procedure 2014, HTN, chronic combined systolic and diastolic HF who is went to the ER 04/24/19 for the evaluation of progressive chest pain. In 12/2018 she had a heart cath with Dr. Tamala Julian that demonstrated an occluded SVG to RCA. Attempt was made at PCI of the native RCA, but was not successful and was suggestive more of CTO. Dr. Tamala Julian discussed the case with Dr. Beau Fanny who thought a CTO attempt would be feasible if she failed medical therapy. The patient continued to have worsening unstable angina with associated shortness of breath, diaphoresis, and nausea and went to the ED.  Hospital Course     Consultants: None  In the ED patient was hypertensive. Chest pain was slowly improving since arrival. initial troponin was 10, but came up to 75. EKG showed sinus tachycardia with no new ischemic changes. Patient was admitted for NSTEMI. She was started on IV heparin and IV NTG. The films were reviewed again and felt patient could be a candidate for native CTO-RCA intervention. Intervention could not be performed that day and  was therefore scheduled for the following week. It was noted the patient was supposed to be taking plavix, but she had no knowledge of this. She was reloaded with Plavix 300 mg with plan for 75 mg daily the subsequent days. Benazepril, HCTZ, and atenolol were continued on admission. BNP was up a little but patient did not appear fluid overloaded and CXR was unremarkable. She remained on her home lasix dose. Echo showed EF 55-60% with G1DD. Blood pressures were improved on NTG drip. On day 5 of admission the patient was taken to the cath lab for possible CTO RCA with Dr. Tamala Julian. She was premedicated for contrast dye allergy. Creatinine was stable. Dr. Tamala Julian had an unsuccessful attempt of PCI RCA. The patient was taken back to the nursing unit with plans for repeat cath the following day with Dr. Martinique. Asprin and plavix were continued. Right radial cath site remained clean and dry with no hematoma. Patient was once again pre-medicated for dye allergy in preparation of repeat cardiac catheterization with Dr. Martinique. Creatinine and hemoglobin remained stable. She was taken down to the cath lab and right groin access was established. Cath showed 75% prox RCA stenosis, 80% stenosis of RPAV lesion, 99% stenosis of mid RCA, 60% stenosis Ost RCA lesion. She was treated with successful CTO RCA with rotational  atherectomy and DES x 2. Post catheterization recommendations for continued DAPT aspirin and plavix x 1 year. Post procedure the patient went into an isolated event of afib RVR, rates 100-120s. EKG was obtained. Patient does not remember being symptomatic at that time. By the time she was taken back up to the floor she was back in NSR, rates 70-80s. She remained in NSR overnight. TSH and magnesium were wnl. Potasium was mildly low and supplemented. She did not have recurrent chest pain or shortness of breath. Right groin was clean and dry with some ecchymosis and a small hematoma which seemed to resolve by discharge.  Patient worked with cardiac rehab and ambulated without symptoms. Hgb 10.1 and creatinine 0.88. LDL came back at 90 and Lipitor was changed to Rosuvastatin 10 mg daily.  Plan to discharge patient on DAPT aspirin and plavix for 12 months. Continue Rosuvastatin 10 mg and monitor for intolerance. All other home meds were continued at discharge. With the isolated event of afib anticoagulation was not started and decision was defered to Dr. Tamala Julian as outpatient.   The patient was evaluated on 04/30/19 by Dr. Sallyanne Kuster and felt to be stable for discharge. Hospital follow up was scheduled.    Did the patient have an acute coronary syndrome (MI, NSTEMI, STEMI, etc) this admission?:  Yes                               AHA/ACC Clinical Performance & Quality Measures: 1. Aspirin prescribed? - Yes 2. ADP Receptor Inhibitor (Plavix/Clopidogrel, Brilinta/Ticagrelor or Effient/Prasugrel) prescribed (includes medically managed patients)? - Yes 3. Beta Blocker prescribed? - Yes 4. High Intensity Statin (Lipitor 40-80mg  or Crestor 20-40mg ) prescribed? - Yes 5. EF assessed during THIS hospitalization? - Yes 6. For EF <40%, was ACEI/ARB prescribed? - Not Applicable (EF >/= [id number]) 7. For EF <40%, Aldosterone Antagonist (Spironolactone or Eplerenone) prescribed? - Not Applicable (EF >/= [id number]) 8. Cardiac Rehab Phase II ordered (Included Medically managed Patients)? - Yes   _____________  Discharge Vitals Blood pressure 130/62, pulse 68, temperature 98 F (36.7 C), temperature source Oral, resp. rate 18, height 5\' 7"  (1.702 m), weight 58.9 kg, SpO2 98 %.  Filed Weights   04/27/19 0544 04/29/19 0333 04/30/19 0600  Weight: 59.1 kg 61.1 kg 58.9 kg    Labs & Radiologic Studies    CBC Recent Labs    04/29/19 0309 04/30/19 0307  WBC 15.6* 13.3*  HGB 11.7* 10.1*  HCT 34.4* 30.1*  MCV 92.2 93.8  PLT 202 0000000   Basic Metabolic Panel Recent Labs    04/29/19 0534 04/30/19 0307 04/30/19 0937  NA 135 137  --   K  3.9 3.3*  --   CL 104 106  --   CO2 19* 20*  --   GLUCOSE 136* 100*  --   BUN 23 44*  --   CREATININE 0.84 0.88  --   CALCIUM 9.1 8.8*  --   MG  --   --  2.0   Liver Function Tests No results for input(s): AST, ALT, ALKPHOS, BILITOT, PROT, ALBUMIN in the last 72 hours. No results for input(s): LIPASE, AMYLASE in the last 72 hours. High Sensitivity Troponin:   Recent Labs  Lab 04/24/19 1023 04/24/19 1205 04/24/19 1450 04/24/19 1701  TROPONINIHS 10 75* 383* 744*    BNP Invalid input(s): POCBNP D-Dimer No results for input(s): DDIMER in the last 72 hours. Hemoglobin A1C No results for  input(s): HGBA1C in the last 72 hours. Fasting Lipid Panel No results for input(s): CHOL, HDL, LDLCALC, TRIG, CHOLHDL, LDLDIRECT in the last 72 hours. Thyroid Function Tests Recent Labs    04/30/19 0937  TSH 2.141   _____________  Dg Chest Portable 1 View  Result Date: 04/24/2019 CLINICAL DATA:  Chest pain since yesterday EXAM: PORTABLE CHEST 1 VIEW COMPARISON:  June 03, 2015 FINDINGS: Interstitial prominence, likely chronic. No focal consolidation. No pleural effusion or pneumothorax. Normal heart size with evidence of prior cardiac surgery. Included osseous structures are unremarkable. IMPRESSION: No active disease. Electronically Signed   By: Macy Mis M.D.   On: 04/24/2019 11:11   Disposition   Pt is being discharged home today in good condition.  Follow-up Plans & Appointments    Follow-up Information    Belva Crome, MD Follow up on 05/04/2019.   Specialty: Cardiology Why: Please go to hospital follow up November 9th at 4:00 PM Contact information: [address] [place] [address] [phone]          Discharge Instructions    Amb Referral to Cardiac Rehabilitation   Complete by: As directed    Diagnosis: Coronary Stents   After initial evaluation and assessments completed: Virtual Based Care may be provided alone or in conjunction with  Phase 2 Cardiac Rehab based on patient barriers.: Yes      Discharge Medications   Allergies as of 04/30/2019      Reactions   Contrast Media [iodinated Diagnostic Agents] Hives   Zetia [ezetimibe] Other (See Comments)   Muscle cramps   Oxycodone Nausea And Vomiting, Other (See Comments)   Extreme nausea and vomiting   Repatha [evolocumab] Other (See Comments)   Muscle cramps      Medication List    STOP taking these medications   atorvastatin 10 MG tablet Commonly known as: LIPITOR     TAKE these medications   aspirin EC 81 MG tablet Take 1 tablet (81 mg total) by mouth daily.   atenolol 100 MG tablet Commonly known as: TENORMIN TAKE 1 TABLET(100 MG) BY MOUTH DAILY What changed: See the new instructions.   benazepril 40 MG tablet Commonly known as: LOTENSIN TAKE 1 TABLET(40 MG) BY MOUTH DAILY What changed: See the new instructions.   cholecalciferol 1000 units tablet Commonly known as: VITAMIN D Take 1,000 Units by mouth daily.   clopidogrel 75 MG tablet Commonly known as: PLAVIX Take 1 tablet (75 mg total) by mouth daily with breakfast. Start taking on: May 01, 2019   furosemide 20 MG tablet Commonly known as: LASIX Take 1 tablet (20 mg total) by mouth as needed for fluid or edema. Reported on 12/19/2015   GLUCOSAMINE-CHONDROITIN-VIT D3 PO Take 1 tablet by mouth daily.   hydrochlorothiazide 25 MG tablet Commonly known as: HYDRODIURIL TAKE 1 TABLET(25 MG) BY MOUTH DAILY What changed:   how much to take  how to take this  when to take this  additional instructions   isosorbide mononitrate 60 MG 24 hr tablet Commonly known as: IMDUR Take 1 tablet (60 mg total) by mouth daily.   multivitamin tablet Take 1 tablet by mouth daily.   nitroGLYCERIN 0.4 MG SL tablet Commonly known as: NITROSTAT Place 1 tablet (0.4 mg total) under the tongue every 5 (five) minutes as needed for chest pain.   potassium chloride 10 MEQ tablet Commonly known as:  KLOR-CON Take 10 mEq by mouth every 3 (three) days.   rosuvastatin 10 MG  tablet Commonly known as: CRESTOR Take 1 tablet (10 mg total) by mouth daily.          Outstanding Labs/Studies   None  Duration of Discharge Encounter   Greater than 30 minutes including physician time.  Signed, Sherryll Skoczylas Ninfa Meeker, PA-C 04/30/2019, 11:32 AM

## 2019-04-30 NOTE — Progress Notes (Addendum)
Progress Note  Patient Name: Claudia Howell Date of Encounter: 04/30/2019  Primary Cardiologist: Sinclair Grooms, MD   Subjective   Cath yesterday. Post procedure patient went into afib RVR, rates in 100-110. By the time the patient was brought up to the room she had converted to NSR.  Patient denies chest pain or SOB. Right groin has ecchymosis with a small hematoma.    Inpatient Medications    Scheduled Meds: . angioplasty book   Does not apply Once  . aspirin  81 mg Oral Daily  . atenolol  100 mg Oral Daily  . benazepril  40 mg Oral Daily  . cholecalciferol  1,000 Units Oral Daily  . clopidogrel  75 mg Oral Q breakfast  . hydrochlorothiazide  25 mg Oral Daily  . multivitamin with minerals  1 tablet Oral Daily  . rosuvastatin  10 mg Oral q1800  . sodium chloride flush  3 mL Intravenous Q12H  . sodium chloride flush  3 mL Intravenous Q12H  . sodium chloride flush  3 mL Intravenous Q12H  . sodium chloride flush  3 mL Intravenous Q12H   Continuous Infusions: . sodium chloride    . sodium chloride    . nitroGLYCERIN Stopped (04/29/19 1400)   PRN Meds: sodium chloride, sodium chloride, acetaminophen, acetaminophen, nitroGLYCERIN, ondansetron (ZOFRAN) IV, ondansetron (ZOFRAN) IV, senna, sodium chloride flush, sodium chloride flush, sodium chloride flush   Vital Signs    Vitals:   04/29/19 1841 04/29/19 1926 04/29/19 1957 04/30/19 0600  BP: (!) 131/96  (!) 124/59 107/89  Pulse: 73 73 73   Resp: 15 16 17 18   Temp:   99.1 F (37.3 C) 98 F (36.7 C)  TempSrc:   Oral Oral  SpO2: 98% 97% 98% 98%  Weight:    58.9 kg  Height:        Intake/Output Summary (Last 24 hours) at 04/30/2019 0727 Last data filed at 04/30/2019 0600 Gross per 24 hour  Intake 1394.8 ml  Output 1500 ml  Net -105.2 ml   Last 3 Weights 04/30/2019 04/29/2019 04/27/2019  Weight (lbs) 129 lb 14.4 oz 134 lb 9.6 oz 130 lb 6.4 oz  Weight (kg) 58.922 kg 61.054 kg 59.149 kg      Telemetry    NSR -  Personally Reviewed  ECG    NSR, 61 bpm, TWI III - Personally Reviewed  Physical Exam   GEN: No acute distress.   Neck: No JVD Cardiac: RRR, no murmurs, rubs, or gallops.  Respiratory: Clear to auscultation bilaterally. GI: Soft, nontender, non-distended  MS: No edema; right groin cath site with some ecchymosis and a small hematoma, non-tender; no bruit Neuro:  Nonfocal  Psych: Normal affect   Labs    High Sensitivity Troponin:   Recent Labs  Lab 04/24/19 1023 04/24/19 1205 04/24/19 1450 04/24/19 1701  TROPONINIHS 10 75* 383* 744*      Chemistry Recent Labs  Lab 04/24/19 1023  04/28/19 1948 04/29/19 0534 04/30/19 0307  NA 133*   < > 131* 135 137  K 3.2*   < > 3.4* 3.9 3.3*  CL 96*   < > 102 104 106  CO2 22   < > 19* 19* 20*  GLUCOSE 127*   < > 148* 136* 100*  BUN 14   < > 21 23 44*  CREATININE 0.83   < > 0.87 0.84 0.88  CALCIUM 9.1   < > 9.0 9.1 8.8*  PROT 7.5  --   --   --   --  ALBUMIN 3.9  --   --   --   --   AST 21  --   --   --   --   ALT 15  --   --   --   --   ALKPHOS 47  --   --   --   --   BILITOT 0.8  --   --   --   --   GFRNONAA >60   < > >60 >60 >60  GFRAA >60   < > >60 >60 >60  ANIONGAP 15   < > 10 12 11    < > = values in this interval not displayed.     Hematology Recent Labs  Lab 04/28/19 1948 04/29/19 0309 04/30/19 0307  WBC 16.0* 15.6* 13.3*  RBC 3.61* 3.73* 3.21*  HGB 11.4* 11.7* 10.1*  HCT 33.7* 34.4* 30.1*  MCV 93.4 92.2 93.8  MCH 31.6 31.4 31.5  MCHC 33.8 34.0 33.6  RDW 12.1 12.2 12.8  PLT 212 202 193    BNP Recent Labs  Lab 04/24/19 1023  BNP 155.8*     DDimer No results for input(s): DDIMER in the last 168 hours.   Radiology    No results found.  Cardiac Studies   Cardiac cath 04/29/19  Prox RCA lesion is 75% stenosed.  A drug-eluting stent was successfully placed using a STENT SYNERGY DES 2.75X16.  RPAV lesion is 80% stenosed.  Mid RCA lesion is 99% stenosed.  A drug-eluting stent was  successfully placed using a STENT SYNERGY DES 2.5X38.  Post intervention, there is a 0% residual stenosis.  Post intervention, there is a 0% residual stenosis.  Ost RCA lesion is 60% stenosed.  Post intervention, there is a 0% residual stenosis.   1. Successful CTO PCI of RCA with Rotational atherectomy and DES x 2.   Plan: DAPT for at least one year. Will monitor overnight. Anticipate DC in am if no complications.   Cardiac Cath 04/28/19  Left internal mammary to mid LAD is widely patent  Bypass graft obtuse marginal is widely patent including the stent in the distal graft.  The patient has known occlusion of the saphenous vein graft to the right coronary.  Left main is patent. Mid LAD after a large diagonal contains 85% stenosis. Competitive flow beyond this is noted due to the patent LIMA.  Native circumflex is totally occluded.  The right coronary contains diffuse disease with 85 to 90% calcified mid segment stenosis segmental 40% narrowing in the mid, followed by calcified focal mid stenosis that is angulated and does have a a channel. I was unable to wire this successfully on the last procedure.  Normal LVEDP. Echo this admission demonstrates normal systolic function.  RECOMMENDATIONS:   Attempt PCI of right coronary tomorrow by Dr. Martinique. Hopefully he can cross the lesion primarily but may need CTO techniques on the mid right coronary.  N.p.o. after midnight.  Left Heart Cath 01/01/19  Total occlusion of the saphenous vein graft to the right coronary.  Native RCA is severely diseased with ostial 60%, proximal to mid 75%, and mid 99%. There is distal 90% beyond the origin of the PDA and the continuation. Faint left to right collaterals are noted.  Failed angioplasty of the native right coronary due to inability to cross the severe mid vessel stenosis with a wire. (wire escalation was used).  Patent LIMA to LAD, and patent saphenous vein graft to the  circumflex marginal.  90% mid LAD and  diffuse 50% proximal LAD stenosis. Small first diagonal contains 75% ostial stenosis.  Total occlusion of the proximal circumflex.  Normal left ventricular function. LVEDP less than 15. EF 55%.   RECOMMENDATIONS:   Continue medical therapy for control of angina. This will include beta-blocker and long-acting nitrate therapy.  Encouraging that collaterals are noted and hopefully will improve with time.  Consult CTO team to determine if this lesion could be treated as a CTO.  Continue Plavix for now.  Bedrest for 4 hours following Mynx. Discharge later today if stable      Echo 04/26/19 1. Left ventricular ejection fraction, by visual estimation, is 55 to 60%. The left ventricle has normal function. There is no left ventricular hypertrophy. 2. Left ventricular diastolic parameters are consistent with Grade II diastolic dysfunction (pseudonormalization). 3. Global right ventricle has normal systolic function.The right ventricular size is normal. No increase in right ventricular wall thickness. 4. Left atrial size was mildly dilated. 5. Right atrial size was mildly dilated. 6. The mitral valve is normal in structure. Mild mitral valve regurgitation. No evidence of mitral stenosis. 7. The tricuspid valve is normal in structure. Tricuspid valve regurgitation is trivial. 8. The aortic valve is tricuspid. Aortic valve regurgitation is not visualized. No evidence of aortic valve sclerosis or stenosis. 9. The pulmonic valve was normal in structure. Pulmonic valve regurgitation is not visualized. 10. The inferior vena cava is normal in size with greater than 50% respiratory variability, suggesting right atrial pressure of 3 mmHg.  Patient Profile     78 y.o. female with CAD s/p CABG 2014 with maze procedure, SVG stent 2014, HTN, chronic combined systolic and diastolic HF, paroxysmal afib s/p Maze procedure, and recent cath in  July 2020 showing severe disease in the RCA, patent graft, 90% lesion beyond PDA origin with inability to cross the severe mid vessel stenosis with a wire. Medical management was continued at that time. Patient presented 04/24/19 for chest pain and had unsuccessful attempt at PCI with Dr. Tamala Julian. She underwent repeat cath with Dr. Martinique.  Assessment & Plan    Unstable Angina/CAD s/p CABG 2014 - Heart cath in July 2020 showing severe disease in the RCA, 90% lesion beyond PDA origin with inability to cross the severe mid vessel stenosis with a wire, patent graft. Medical management was continued at that time. - Presented 04/24/19 with unstable angina, relieved with NTG, placed on NTG drip - Atempted CTO PCI by Dr. Tamala Julian 04/28/19. Yesterday underwent successful CTO PCI RCA with rotational atherectomy and DES x 2 by Dr. Martinique. Postprocedural patient went into brief run of afib RVR, rates 100-110.  - Echo this admission showed EF 55-60%, G1DD - Denies recurrent CP and SOB - Right groin in clean and dry with some ecchymosis and a small, nontender hematoma; no bruit; monitor - We monitor cath site when walking with cardiac rehab - continue ASA and Plavix - continue statin - Will discontinue the NTG drip. Can consider starting  Imdur. BP 130/62 - Creatinine 0.88, Hgb [id number]  Chronic diastolic HF - Patient takes Lasix 20 mg as needed - Pt does not appear to be volume overloaded  Paroxysmal Afib s/p maze procedure in 2014 - patient had an isolated of afib RVR post-procedure. She was back to NSR once she got back on the floor - Maintaining NSR - CHADSVASC = 5 (CHF, age, HTN, female). Will discuss with MD initiation of anticoagulation. - will check TSH and Mag. Potassium is 3.3>> will supplement  HTN -  HCTZ 25 mg, Atenolol 100 mg daily, benazepril 40 mg daily - stable  Hyperlipidemia - LDL 90 this admission.  - Previous notes suggest she refused PCSK9-I in the past and has had some intolerance  to statins. - Lipitor was changed to Rosuvastatin 10mg   For questions or updates, please contact [place] Please consult [email or web address] for contact info under        Signed, Cadence Ninfa Meeker, PA-C  04/30/2019, 7:27 AM    I have seen and examined the patient along with Cadence Ninfa Meeker, PA-C.  I have reviewed the chart, notes and new data.  I agree with PA/NP's note.  Key new complaints: no angina, no palpitations, no pain at arterial access site Key examination changes: no hematoma/ecchymosis at access site Key new findings / data: no further atrial fibrillation overnight  PLAN: Discussed risk/benefit balance of anticoagulants. She has had severe epistaxis w anticoagulants and w Brilinta. She refuses to take anticoagulants. In the short term, while DAPT is mandatory anyway, I agree that the bleeding risk is excessive. Long term, when we can simplify to clopidogrel alone, might want to reconsider. Watchman is also an option.  D/W Dr. Tamala Julian.  Sanda Klein, MD, [place] [phone] 04/30/2019, 10:48 AM

## 2019-05-03 NOTE — Progress Notes (Signed)
Cardiology Office Note:    Date:  05/04/2019   ID:  Claudia Howell, DOB 05-17-1941, MRN [id number]  PCP:  Rita Ohara, MD  Cardiologist:  Sinclair Grooms, MD   Referring MD: Rita Ohara, MD   Chief Complaint  Patient presents with  . Chest Pain  . Coronary Artery Disease    History of Present Illness:    Claudia Howell is a 78 y.o. female with a hx of  coronary artery disease with bypass graft(January/2014), saphenous vein graft stent(10/2012), hypertension, acute on chronic systolic and diastolic heart failure, history of paroxysmal atrial fibrillation,Maze procedure doing coronary bypass grafting, and essential hypertension. Unstable angina 04/28/2019 treated with RCA CTO and stent (Martinique).  Claudia Howell is doing relatively well.  She has some confusion during the hospital stay after she received sedation.  Since being at home she has had no recurrence of angina.  Dr. Martinique did the equivalent of a CTO procedure on the right coronary which was crossed primarily with a wire without CTO technique (Judao wire).  She then required rotational atherectomy and had multiple stent implantations.  She was discharged home from the hospital the next day.  Today she complains that she has noted low blood pressures since discharge.  She is essentially on the same medication as prior to admission.  Said her blood pressure was 50 mmHg systolic this morning.  Past Medical History:  Diagnosis Date  . Adenomatous colon polyp 10/09  . CHF (congestive heart failure) ([place])   . Coronary artery disease    Last cath 12/14 Cath 12/8 100% SVG occlusion to RCA, 95% SVG.  The stenosis to OM.  LIMA to LAD patent but diffuse disease in LAD after graft.  Overlapping stents mid/distal SVG to OM.  Marland Kitchen Decubitus ulcer of coccyx 10/22/12   1/2 inch raw open area on coccyx  . Hyperlipidemia   . Hypertension    Dr.Amil Moseman Tamala Julian  . Osteopenia 9/09  . PAF (paroxysmal atrial fibrillation) ([place])    during cath/notes 10/16/2012  .  S/P CABG x 3 10/22/2012   LIMA to LAD, SVG to distal RCA, SVG to OM, EVH from right thigh  . S/P Maze operation for atrial fibrillation 10/22/2012   Left side lesion set using bipolar radiofrequency and cryothermy ablation with clipping of LA appendage    Past Surgical History:  Procedure Laterality Date  . BREAST CYST EXCISION Left 1980-1990's   x 3  . CARDIAC CATHETERIZATION  10/16/2012  . CATARACT EXTRACTION, BILATERAL Bilateral    03/2015 and 05/2015  . CATARACT EXTRACTION, BILATERAL Bilateral approx 2016  . CORONARY ANGIOPLASTY WITH STENT PLACEMENT  04/01/2013   "1" (06/01/2013)  . CORONARY ARTERY BYPASS GRAFT N/A 10/22/2012   Procedure: CORONARY ARTERY BYPASS GRAFTING (CABG);  Surgeon: Rexene Alberts, MD;  Location: [place];  Service: Open Heart Surgery;  Laterality: N/A;  . CORONARY ATHERECTOMY N/A 04/29/2019   Procedure: CORONARY ATHERECTOMY;  Surgeon: Martinique, Peter M, MD;  Location: [place] [facility];  Service: Cardiovascular;  Laterality: N/A;  . CORONARY CTO INTERVENTION N/A 04/29/2019   Procedure: CORONARY CTO INTERVENTION;  Surgeon: Martinique, Peter M, MD;  Location: [place] [facility];  Service: Cardiovascular;  Laterality: N/A;  . CORONARY STENT INTERVENTION N/A 01/01/2019   Procedure: CORONARY STENT INTERVENTION;  Surgeon: Belva Crome, MD;  Location: [place] [facility];  Service: Cardiovascular;  Laterality: N/A;  . DILATION AND CURETTAGE OF UTERUS    . INTRAOPERATIVE TRANSESOPHAGEAL ECHOCARDIOGRAM N/A 10/22/2012  Procedure: INTRAOPERATIVE TRANSESOPHAGEAL ECHOCARDIOGRAM;  Surgeon: Rexene Alberts, MD;  Location: [place];  Service: Open Heart Surgery;  Laterality: N/A;  . LEFT HEART CATH AND CORS/GRAFTS ANGIOGRAPHY N/A 01/01/2019   Procedure: LEFT HEART CATH AND CORS/GRAFTS ANGIOGRAPHY;  Surgeon: Belva Crome, MD;  Location: [place] [facility];  Service: Cardiovascular;  Laterality: N/A;  . LEFT HEART CATH AND CORS/GRAFTS ANGIOGRAPHY N/A 04/28/2019   Procedure: LEFT HEART CATH AND  CORS/GRAFTS ANGIOGRAPHY;  Surgeon: Belva Crome, MD;  Location: [place] [facility];  Service: Cardiovascular;  Laterality: N/A;  . LEFT HEART CATHETERIZATION WITH CORONARY ANGIOGRAM N/A 10/16/2012   Procedure: LEFT HEART CATHETERIZATION WITH CORONARY ANGIOGRAM;  Surgeon: Sinclair Grooms, MD;  [facility];  Service: Cardiovascular;  Laterality: N/A;  . LEFT HEART CATHETERIZATION WITH CORONARY/GRAFT ANGIOGRAM N/A 06/01/2013   Procedure: LEFT HEART CATHETERIZATION WITH Beatrix Fetters;  Surgeon: Sinclair Grooms, MD;  Location: [facility] CATH LAB;  Service: Cardiovascular;  Laterality: N/A;  . MAZE N/A 10/22/2012   Procedure: MAZE;  Surgeon: Rexene Alberts, MD;  Location: [place];  Service: Open Heart Surgery;  Laterality: N/A;  . PERCUTANEOUS CORONARY STENT INTERVENTION (PCI-S)  06/01/2013   Procedure: PERCUTANEOUS CORONARY STENT INTERVENTION (PCI-S);  Surgeon: Sinclair Grooms, MD;  Location: [facility] CATH LAB;  Service: Cardiovascular;;  . TONSILLECTOMY  1949  . TOTAL ABDOMINAL HYSTERECTOMY W/ BILATERAL SALPINGOOPHORECTOMY  1990   benign growth    Current Medications: Current Meds  Medication Sig  . aspirin EC 81 MG tablet Take 1 tablet (81 mg total) by mouth daily.  Marland Kitchen atenolol (TENORMIN) 100 MG tablet Take 100 mg by mouth daily.  . benazepril (LOTENSIN) 40 MG tablet Take 40 mg by mouth daily.  . cholecalciferol (VITAMIN D) 1000 UNITS tablet Take 1,000 Units by mouth daily.  . clopidogrel (PLAVIX) 75 MG tablet Take 1 tablet (75 mg total) by mouth daily with breakfast.  . Coenzyme Q10 (COQ10 PO) Take 1 capsule by mouth daily.  . furosemide (LASIX) 20 MG tablet Take 1 tablet (20 mg total) by mouth as needed for fluid or edema. Reported on 12/19/2015  . GLUCOSAMINE-CHONDROITIN-VIT D3 PO Take 1 tablet by mouth 2 (two) times a week.   . hydrochlorothiazide (HYDRODIURIL) 25 MG tablet Take 25 mg by mouth daily.  . Multiple Vitamin (MULTIVITAMIN) tablet Take 1 tablet by mouth daily.  .  nitroGLYCERIN (NITROSTAT) 0.4 MG SL tablet Place 1 tablet (0.4 mg total) under the tongue every 5 (five) minutes as needed for chest pain.  . potassium chloride (K-DUR) 10 MEQ tablet Take 10 mEq by mouth every 3 (three) days.   . rosuvastatin (CRESTOR) 10 MG tablet Take 1 tablet (10 mg total) by mouth daily.     Allergies:   Contrast media [iodinated diagnostic agents], Zetia [ezetimibe], Oxycodone, and Repatha [evolocumab]   Social History   Socioeconomic History  . Marital status: Widowed    Spouse name: Not on file  . Number of children: 2  . Years of education: Not on file  . Highest education level: Not on file  Occupational History  . Occupation: retired    Comment: LPN  Social Needs  . Financial resource strain: Not on file  . Food insecurity    Worry: Not on file    Inability: Not on file  . Transportation needs    Medical: Not on file    Non-medical: Not on file  Tobacco Use  . Smoking status: Never Smoker  .  Smokeless tobacco: Never Used  Substance and Sexual Activity  . Alcohol use: Yes    Alcohol/week: 1.0 standard drinks    Types: 1 Glasses of wine per week    Comment: 1 glass of wine 1 times/week  . Drug use: No  . Sexual activity: Not Currently  Lifestyle  . Physical activity    Days per week: Not on file    Minutes per session: Not on file  . Stress: Not on file  Relationships  . Social Herbalist on phone: Not on file    Gets together: Not on file    Attends religious service: Not on file    Active member of club or organization: Not on file    Attends meetings of clubs or organizations: Not on file    Relationship status: Not on file  Other Topics Concern  . 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  1 son in [place], 1 son in [place]. 7 grandchildren, 2 great granddaughters (in [place]), one more on the way     Family History: The patient's family history includes Cancer in her brother; Heart attack (age of  onset: 50) in her mother; Heart disease in an other family member; Heart disease (age of onset: 82) in her brother; Hypertension in her brother and mother. There is no history of Diabetes, Breast cancer, or Colon cancer.  ROS:   Please see the history of present illness.    No claudication.  All other systems reviewed and are negative.  EKGs/Labs/Other Studies Reviewed:    The following studies were reviewed today: Cath as outlined from 04/29/2019:  Diagnostic Dominance: Right  Intervention     EKG:  EKG not repeated  Recent Labs: 04/24/2019: ALT 15; B Natriuretic Peptide 155.8 04/30/2019: BUN 44; Creatinine, Ser 0.88; Hemoglobin 10.1; Magnesium 2.0; Platelets 193; Potassium 3.3; Sodium 137; TSH 2.141  Recent Lipid Panel    Component Value Date/Time   CHOL 168 04/25/2019 0819   CHOL 189 02/04/2019 1157   TRIG 106 04/25/2019 0819   HDL 57 04/25/2019 0819   HDL 52 02/04/2019 1157   CHOLHDL 2.9 04/25/2019 0819   VLDL 21 04/25/2019 0819   LDLCALC 90 04/25/2019 0819   LDLCALC 111 (H) 02/04/2019 1157    Physical Exam:    VS:  BP 106/62   Pulse 80   Ht 5\' 6"  (1.676 m)   Wt 133 lb 9.6 oz (60.6 kg)   SpO2 98%   BMI 21.56 kg/m     Wt Readings from Last 3 Encounters:  05/04/19 133 lb 9.6 oz (60.6 kg)  04/30/19 129 lb 14.4 oz (58.9 kg)  02/19/19 137 lb (62.1 kg)     GEN: Older than stated age. No acute distress HEENT: Normal NECK: No JVD. LYMPHATICS: No lymphadenopathy CARDIAC:  RRR without murmur, gallop, or edema. VASCULAR:  Normal Pulses. No bruits..  Ecchymosis over left wrist. RESPIRATORY:  Clear to auscultation without rales, wheezing or rhonchi  ABDOMEN: Soft, non-tender, non-distended, No pulsatile mass, MUSCULOSKELETAL: No deformity  SKIN: Warm and dry NEUROLOGIC:  Alert and oriented x 3 PSYCHIATRIC:  Normal affect   ASSESSMENT:    1. Coronary artery disease of bypass graft of native heart with stable angina pectoris (HCC)   2. Paroxysmal atrial  fibrillation ([place])   3. Essential hypertension, benign   4. Pure hypercholesterolemia   5. Chronic diastolic heart failure ([place])   6. Educated about COVID-19  virus infection    PLAN:    In order of problems listed above:  1. Secondary prevention discussed. 2. No recurrence of atrial for ablation and clinically in sinus rhythm today. 3. Discontinue isosorbide. 4. Continue aggressive lipid-lowering.  She is on rosuvastatin plus injectable (Repatha).  I have to confirm whether or not she is still taking. 5. No evidence of volume overload on exam. 6. Continue to practice the 3W's to prevent Covid 19 infection.  Discontinue Imdur.  Monitor blood pressure.  Notify of chest discomfort.  Clinical follow-up in 1 month.   Medication Adjustments/Labs and Tests Ordered: Current medicines are reviewed at length with the patient today.  Concerns regarding medicines are outlined above.  No orders of the defined types were placed in this encounter.  No orders of the defined types were placed in this encounter.   Patient Instructions  Medication Instructions:  1) DISCONTINUE Imdur  *If you need a refill on your cardiac medications before your next appointment, please call your pharmacy*  Lab Work: None If you have labs (blood work) drawn today and your tests are completely normal, you will receive your results only by: Marland Kitchen MyChart Message (if you have MyChart) OR . A paper copy in the mail If you have any lab test that is abnormal or we need to change your treatment, we will call you to review the results.  Testing/Procedures: None  Follow-Up: At [facility], you and your health needs are our priority.  As part of our continuing mission to provide you with exceptional heart care, we have created designated Provider Care Teams.  These Care Teams include your primary Cardiologist (physician) and Advanced Practice Providers (APPs -  Physician Assistants and Nurse Practitioners) who all work  together to provide you with the care you need, when you need it.  Your next appointment:   1 month  The format for your next appointment:   In Person  Provider:   You may see Sinclair Grooms, MD or one of the following Advanced Practice Providers on your designated Care Team:    Truitt Merle, NP  Cecilie Kicks, NP  Kathyrn Drown, NP   Other Instructions  Monitor your blood pressure daily and record those numbers.  Please contact the office if readings get too low or too high.      Signed, Sinclair Grooms, MD  05/04/2019 4:25 PM    [facility]

## 2019-05-04 ENCOUNTER — Telehealth (HOSPITAL_COMMUNITY): Payer: Self-pay

## 2019-05-04 ENCOUNTER — Other Ambulatory Visit: Payer: Self-pay

## 2019-05-04 ENCOUNTER — Encounter: Payer: Self-pay | Admitting: Interventional Cardiology

## 2019-05-04 ENCOUNTER — Ambulatory Visit (INDEPENDENT_AMBULATORY_CARE_PROVIDER_SITE_OTHER): Payer: Medicare Other | Admitting: Interventional Cardiology

## 2019-05-04 VITALS — BP 106/62 | HR 80 | Ht 66.0 in | Wt 133.6 lb

## 2019-05-04 DIAGNOSIS — Z7189 Other specified counseling: Secondary | ICD-10-CM

## 2019-05-04 DIAGNOSIS — I10 Essential (primary) hypertension: Secondary | ICD-10-CM | POA: Diagnosis not present

## 2019-05-04 DIAGNOSIS — I480 Paroxysmal atrial fibrillation: Secondary | ICD-10-CM | POA: Diagnosis not present

## 2019-05-04 DIAGNOSIS — I25708 Atherosclerosis of coronary artery bypass graft(s), unspecified, with other forms of angina pectoris: Secondary | ICD-10-CM

## 2019-05-04 DIAGNOSIS — I209 Angina pectoris, unspecified: Secondary | ICD-10-CM

## 2019-05-04 DIAGNOSIS — I5032 Chronic diastolic (congestive) heart failure: Secondary | ICD-10-CM | POA: Diagnosis not present

## 2019-05-04 DIAGNOSIS — E7800 Pure hypercholesterolemia, unspecified: Secondary | ICD-10-CM | POA: Diagnosis not present

## 2019-05-04 NOTE — Telephone Encounter (Signed)
Pt insurance is active and benefits verified through Medicare A/B. Co-pay $0.00, DED $198.00/$198.00 met, out of pocket $0.00/$0.00 met, co-insurance 20%. No pre-authorization required. Passport, 05/04/2019 @ 11:44AM, REF# [id number]  2ndary insurance is active and benefits verified through [facility]. Co-pay $0.00, DED $0.00/$0.00 met, out of pocket $0.00/$0.00 met, co-insurance 0%. No pre-authorization required. Passport, 05/04/2019 @ 1147AM, DXI#[id number]  Will contact patient to see if she is interested in the Cardiac Rehab Program. If interested, patient will need to complete follow up appt. Once completed, patient will be contacted for scheduling upon review by the RN Navigator.

## 2019-05-04 NOTE — Patient Instructions (Signed)
Medication Instructions:  1) DISCONTINUE Imdur  *If you need a refill on your cardiac medications before your next appointment, please call your pharmacy*  Lab Work: None If you have labs (blood work) drawn today and your tests are completely normal, you will receive your results only by: Marland Kitchen MyChart Message (if you have MyChart) OR . A paper copy in the mail If you have any lab test that is abnormal or we need to change your treatment, we will call you to review the results.  Testing/Procedures: None  Follow-Up: At [facility], you and your health needs are our priority.  As part of our continuing mission to provide you with exceptional heart care, we have created designated Provider Care Teams.  These Care Teams include your primary Cardiologist (physician) and Advanced Practice Providers (APPs -  Physician Assistants and Nurse Practitioners) who all work together to provide you with the care you need, when you need it.  Your next appointment:   1 month  The format for your next appointment:   In Person  Provider:   You may see Sinclair Grooms, MD or one of the following Advanced Practice Providers on your designated Care Team:    Truitt Merle, NP  Cecilie Kicks, NP  Kathyrn Drown, NP   Other Instructions  Monitor your blood pressure daily and record those numbers.  Please contact the office if readings get too low or too high.

## 2019-05-05 ENCOUNTER — Encounter (HOSPITAL_COMMUNITY): Payer: Self-pay

## 2019-05-05 ENCOUNTER — Encounter (HOSPITAL_COMMUNITY): Payer: Medicare Other

## 2019-05-15 ENCOUNTER — Other Ambulatory Visit: Payer: Self-pay | Admitting: Interventional Cardiology

## 2019-05-15 MED ORDER — ROSUVASTATIN CALCIUM 10 MG PO TABS: 10.0000 mg | ORAL_TABLET | Freq: Every day | ORAL | 3 refills | Status: DC

## 2019-05-20 ENCOUNTER — Telehealth (HOSPITAL_COMMUNITY): Payer: Self-pay

## 2019-05-20 NOTE — Telephone Encounter (Signed)
Cardiac Rehab Medication Review by a Pharmacist  Does the patient  feel that his/her medications are working for him/her?  yes  Has the patient been experiencing any side effects to the medications prescribed?  no  Does the patient measure his/her own blood pressure or blood glucose at home?  Yes. Once weekly measures BP and average is 136/74s.   Does the patient have any problems obtaining medications due to transportation or finances?   no  Understanding of regimen: excellent Understanding of indications: excellent Potential of compliance: excellent    Pharmacist comments: n/a   Cristela Felt, PharmD PGY1 Pharmacy Resident [employer]: [phone]    05/20/2019 12:23 PM

## 2019-05-25 ENCOUNTER — Telehealth (HOSPITAL_COMMUNITY): Payer: Self-pay | Admitting: *Deleted

## 2019-05-25 NOTE — Telephone Encounter (Signed)
Spoke with patient confirmed orientation appointment reviewed health history over the phone.Barnet Pall, RN,BSN 05/25/2019 2:49 PM

## 2019-05-26 ENCOUNTER — Encounter (HOSPITAL_COMMUNITY)
Admission: RE | Admit: 2019-05-26 | Discharge: 2019-05-26 | Disposition: A | Discharge status: Home / Self Care | Payer: Medicare Other | Source: Ambulatory Visit | Attending: Interventional Cardiology | Admitting: Interventional Cardiology

## 2019-05-26 ENCOUNTER — Other Ambulatory Visit: Payer: Self-pay

## 2019-05-26 ENCOUNTER — Encounter (HOSPITAL_COMMUNITY): Payer: Self-pay

## 2019-05-26 VITALS — BP 120/62 | Temp 97.3°F | Ht 64.75 in | Wt 136.9 lb

## 2019-05-26 DIAGNOSIS — Z955 Presence of coronary angioplasty implant and graft: Secondary | ICD-10-CM | POA: Insufficient documentation

## 2019-05-26 DIAGNOSIS — I214 Non-ST elevation (NSTEMI) myocardial infarction: Secondary | ICD-10-CM | POA: Insufficient documentation

## 2019-05-26 NOTE — Progress Notes (Signed)
Cardiac Individual Treatment Plan  Patient Details  Name: Claudia Howell MRN: [id number] Date of Birth: 1941-01-12 Referring Provider:     CARDIAC REHAB PHASE II ORIENTATION from 05/26/2019 in [place]  Referring Provider  Dr. Tamala Julian      Initial Encounter Date:    CARDIAC REHAB PHASE II ORIENTATION from 05/26/2019 in [place]  Date  05/26/19      Visit Diagnosis: NSTEMI (non-ST elevated myocardial infarction) [facility])  Coronary stent patent  Patient's Home Medications on Admission:  Current Outpatient Medications:  .  aspirin EC 81 MG tablet, Take 1 tablet (81 mg total) by mouth daily., Disp: , Rfl:  .  atenolol (TENORMIN) 100 MG tablet, Take 100 mg by mouth daily., Disp: , Rfl:  .  benazepril (LOTENSIN) 40 MG tablet, Take 40 mg by mouth daily., Disp: , Rfl:  .  cholecalciferol (VITAMIN D) 1000 UNITS tablet, Take 1,000 Units by mouth daily., Disp: , Rfl:  .  clopidogrel (PLAVIX) 75 MG tablet, Take 1 tablet (75 mg total) by mouth daily with breakfast., Disp: 90 tablet, Rfl: 3 .  Coenzyme Q10 (COQ10 PO), Take 1 capsule by mouth daily., Disp: , Rfl:  .  furosemide (LASIX) 20 MG tablet, Take 1 tablet (20 mg total) by mouth as needed for fluid or edema. Reported on 12/19/2015, Disp: 30 tablet, Rfl: 4 .  GLUCOSAMINE-CHONDROITIN-VIT D3 PO, Take 1 tablet by mouth 2 (two) times a week. , Disp: , Rfl:  .  hydrochlorothiazide (HYDRODIURIL) 25 MG tablet, Take 25 mg by mouth daily., Disp: , Rfl:  .  Multiple Vitamin (MULTIVITAMIN) tablet, Take 1 tablet by mouth daily., Disp: , Rfl:  .  nitroGLYCERIN (NITROSTAT) 0.4 MG SL tablet, Place 1 tablet (0.4 mg total) under the tongue every 5 (five) minutes as needed for chest pain., Disp: 25 tablet, Rfl: 3 .  potassium chloride (K-DUR) 10 MEQ tablet, Take 10 mEq by mouth every 3 (three) days. , Disp: , Rfl:  .  rosuvastatin (CRESTOR) 10 MG tablet, Take 1 tablet (10 mg total) by mouth daily.,  Disp: 90 tablet, Rfl: 3  Past Medical History: Past Medical History:  Diagnosis Date  . Adenomatous colon polyp 10/09  . CHF (congestive heart failure) ([place])   . Coronary artery disease    Last cath 12/14 Cath 12/8 100% SVG occlusion to RCA, 95% SVG.  The stenosis to OM.  LIMA to LAD patent but diffuse disease in LAD after graft.  Overlapping stents mid/distal SVG to OM.  Marland Kitchen Decubitus ulcer of coccyx 10/22/12   1/2 inch raw open area on coccyx  . Hyperlipidemia   . Hypertension    Dr.Henry Tamala Julian  . Osteopenia 9/09  . PAF (paroxysmal atrial fibrillation) ([place])    during cath/notes 10/16/2012  . S/P CABG x 3 10/22/2012   LIMA to LAD, SVG to distal RCA, SVG to OM, EVH from right thigh  . S/P Maze operation for atrial fibrillation 10/22/2012   Left side lesion set using bipolar radiofrequency and cryothermy ablation with clipping of LA appendage    Tobacco Use: Social History   Tobacco Use  Smoking Status Never Smoker  Smokeless Tobacco Never Used    Labs: Recent Review Flowsheet Data    Labs for ITP Cardiac and Pulmonary Rehab Latest Ref Rng & Units 09/06/2016 09/18/2017 12/20/2017 02/04/2019 04/25/2019   Cholestrol 0 - 200 mg/dL 190 177 161 189 168   LDLCALC 0 - 99 mg/dL 106(H)  96 84 111(H) 90   HDL >40 mg/dL 56 48 43 52 57   Trlycerides <150 mg/dL 139 167(H) 172(H) 132 106   Hemoglobin A1c <5.7 % - - - - -   PHART 7.350 - 7.450 - - - - -   PCO2ART 35.0 - 45.0 mmHg - - - - -   HCO3 20.0 - 24.0 mEq/L - - - - -   TCO2 0 - 100 mmol/L - - - - -   ACIDBASEDEF 0.0 - 2.0 mmol/L - - - - -   O2SAT % - - - - -      Capillary Blood Glucose: Lab Results  Component Value Date   GLUCAP 120 (H) 10/24/2012   GLUCAP 133 (H) 10/24/2012   GLUCAP 112 (H) 10/24/2012   GLUCAP 133 (H) 10/23/2012   GLUCAP 140 (H) 10/23/2012     Exercise Target Goals: Exercise Program Goal: Individual exercise prescription set using results from initial 6 min walk test and THRR while considering  patient's  activity barriers and safety.   Exercise Prescription Goal: Starting with aerobic activity 30 plus minutes a day, 3 days per week for initial exercise prescription. Provide home exercise prescription and guidelines that participant acknowledges understanding prior to discharge.  Activity Barriers & Risk Stratification: Activity Barriers & Cardiac Risk Stratification - 05/26/19 1122      Activity Barriers & Cardiac Risk Stratification   Activity Barriers  Balance Concerns;Deconditioning;Muscular Weakness    Cardiac Risk Stratification  High       6 Minute Walk: 6 Minute Walk    Row Name 05/26/19 1121         6 Minute Walk   Phase  Initial     Distance  1400 feet     Walk Time  6 minutes     # of Rest Breaks  0     MPH  2.6     METS  3     RPE  12     Perceived Dyspnea   0     VO2 Peak  10.6     Symptoms  No     Resting HR  74 bpm     Resting BP  120/62     Resting Oxygen Saturation   97 %     Exercise Oxygen Saturation  during 6 min walk  99 %     Max Ex. HR  109 bpm     Max Ex. BP  130/66     2 Minute Post BP  122/62        Oxygen Initial Assessment:   Oxygen Re-Evaluation:   Oxygen Discharge (Final Oxygen Re-Evaluation):   Initial Exercise Prescription: Initial Exercise Prescription - 05/26/19 1100      Date of Initial Exercise RX and Referring Provider   Date  05/26/19    Referring Provider  Dr. Tamala Julian    Expected Discharge Date  07/24/19      Treadmill   MPH  2.3    Grade  1    Minutes  15      NuStep   Level  2    SPM  75    Minutes  15    METs  2.8      Prescription Details   Frequency (times per week)  3    Duration  Progress to 30 minutes of continuous aerobic without signs/symptoms of physical distress      Intensity   THRR 40-80% of Max Heartrate  58-115    Ratings of Perceived Exertion  11-13      Progression   Progression  Continue to progress workloads to maintain intensity without signs/symptoms of physical distress.       Resistance Training   Training Prescription  Yes    Weight  3 lbs.     Reps  10-15       Perform Capillary Blood Glucose checks as needed.  Exercise Prescription Changes:   Exercise Comments:   Exercise Goals and Review:  Exercise Goals    Row Name 05/26/19 1124             Exercise Goals   Increase Physical Activity  Yes       Intervention  Provide advice, education, support and counseling about physical activity/exercise needs.;Develop an individualized exercise prescription for aerobic and resistive training based on initial evaluation findings, risk stratification, comorbidities and participant's personal goals.       Expected Outcomes  Short Term: Attend rehab on a regular basis to increase amount of physical activity.;Long Term: Add in home exercise to make exercise part of routine and to increase amount of physical activity.;Long Term: Exercising regularly at least 3-5 days a week.       Increase Strength and Stamina  Yes       Intervention  Provide advice, education, support and counseling about physical activity/exercise needs.;Develop an individualized exercise prescription for aerobic and resistive training based on initial evaluation findings, risk stratification, comorbidities and participant's personal goals.       Expected Outcomes  Short Term: Increase workloads from initial exercise prescription for resistance, speed, and METs.;Short Term: Perform resistance training exercises routinely during rehab and add in resistance training at home;Long Term: Improve cardiorespiratory fitness, muscular endurance and strength as measured by increased METs and functional capacity (6MWT)       Able to understand and use rate of perceived exertion (RPE) scale  Yes       Intervention  Provide education and explanation on how to use RPE scale       Expected Outcomes  Short Term: Able to use RPE daily in rehab to express subjective intensity level;Long Term:  Able to use RPE to guide  intensity level when exercising independently       Knowledge and understanding of Target Heart Rate Range (THRR)  Yes       Intervention  Provide education and explanation of THRR including how the numbers were predicted and where they are located for reference       Expected Outcomes  Short Term: Able to state/look up THRR;Long Term: Able to use THRR to govern intensity when exercising independently;Short Term: Able to use daily as guideline for intensity in rehab       Able to check pulse independently  Yes       Intervention  Provide education and demonstration on how to check pulse in carotid and radial arteries.;Review the importance of being able to check your own pulse for safety during independent exercise       Expected Outcomes  Short Term: Able to explain why pulse checking is important during independent exercise;Long Term: Able to check pulse independently and accurately       Understanding of Exercise Prescription  Yes       Intervention  Provide education, explanation, and written materials on patient's individual exercise prescription       Expected Outcomes  Short Term: Able to explain program exercise prescription;Long Term: Able to explain  home exercise prescription to exercise independently          Exercise Goals Re-Evaluation :    Discharge Exercise Prescription (Final Exercise Prescription Changes):   Nutrition:  Target Goals: Understanding of nutrition guidelines, daily intake of sodium 1500mg , cholesterol 200mg , calories 30% from fat and 7% or less from saturated fats, daily to have 5 or more servings of fruits and vegetables.  Biometrics: Pre Biometrics - 05/26/19 1124      Pre Biometrics   Height  5' 4.75" (1.645 m)    Weight  62.1 kg    Waist Circumference  34.5 inches    Hip Circumference  38.5 inches    Waist to Hip Ratio  0.9 %    BMI (Calculated)  22.95    Triceps Skinfold  21 mm    % Body Fat  35.4 %    Grip Strength  23 kg    Flexibility  13.5  in    Single Leg Stand  2.18 seconds        Nutrition Therapy Plan and Nutrition Goals:   Nutrition Assessments:   Nutrition Goals Re-Evaluation:   Nutrition Goals Discharge (Final Nutrition Goals Re-Evaluation):   Psychosocial: Target Goals: Acknowledge presence or absence of significant depression and/or stress, maximize coping skills, provide positive support system. Participant is able to verbalize types and ability to use techniques and skills needed for reducing stress and depression.  Initial Review & Psychosocial Screening: Initial Psych Review & Screening - 05/26/19 1045      Initial Review   Current issues with  None Identified      Family Dynamics   Good Support System?  Yes    Comments  Ms Delre denies psychosocial barriers to participation in cardiac rehab or self care/health management. She acknolodges a strong support system including her family, friends, and health care team. She has a positive attitude and outlook.      Barriers   Psychosocial barriers to participate in program  There are no identifiable barriers or psychosocial needs.       Quality of Life Scores: Quality of Life - 05/26/19 1126      Quality of Life   Select  Quality of Life      Quality of Life Scores   Health/Function Pre  22.89 %    Socioeconomic Pre  27.75 %    Psych/Spiritual Pre  26.64 %    Family Pre  24.5 %    GLOBAL Pre  24.89 %      Scores of 19 and below usually indicate a poorer quality of life in these areas.  A difference of  2-3 points is a clinically meaningful difference.  A difference of 2-3 points in the total score of the Quality of Life Index has been associated with significant improvement in overall quality of life, self-image, physical symptoms, and general health in studies assessing change in quality of life.  PHQ-9: Recent Review Flowsheet Data    Depression screen [facility] 2/9 05/26/2019 09/24/2018 09/19/2017 09/06/2016 09/09/2014   Decreased Interest 0 0 0 0 0    Down, Depressed, Hopeless 0 0 0 0 0   PHQ - 2 Score 0 0 0 0 0     Interpretation of Total Score  Total Score Depression Severity:  1-4 = Minimal depression, 5-9 = Mild depression, 10-14 = Moderate depression, 15-19 = Moderately severe depression, 20-27 = Severe depression   Psychosocial Evaluation and Intervention:   Psychosocial Re-Evaluation:   Psychosocial Discharge (Final  Psychosocial Re-Evaluation):   Vocational Rehabilitation: Provide vocational rehab assistance to qualifying candidates.   Vocational Rehab Evaluation & Intervention: Vocational Rehab - 05/26/19 1044      Initial Vocational Rehab Evaluation & Intervention   Assessment shows need for Vocational Rehabilitation  No       Education: Education Goals: Education classes will be provided on a weekly basis, covering required topics. Participant will state understanding/return demonstration of topics presented.  Learning Barriers/Preferences: Learning Barriers/Preferences - 05/26/19 1127      Learning Barriers/Preferences   Learning Barriers  None    Learning Preferences  Written Material;Skilled Demonstration;Video;Audio;Pictoral;Verbal Instruction;Individual Instruction;Group Instruction       Education Topics: Hypertension, Hypertension Reduction -Define heart disease and high blood pressure. Discus how high blood pressure affects the body and ways to reduce high blood pressure.   Exercise and Your Heart -Discuss why it is important to exercise, the FITT principles of exercise, normal and abnormal responses to exercise, and how to exercise safely.   Angina -Discuss definition of angina, causes of angina, treatment of angina, and how to decrease risk of having angina.   Cardiac Medications -Review what the following cardiac medications are used for, how they affect the body, and side effects that may occur when taking the medications.  Medications include Aspirin, Beta blockers, calcium channel  blockers, ACE Inhibitors, angiotensin receptor blockers, diuretics, digoxin, and antihyperlipidemics.   Congestive Heart Failure -Discuss the definition of CHF, how to live with CHF, the signs and symptoms of CHF, and how keep track of weight and sodium intake.   Heart Disease and Intimacy -Discus the effect sexual activity has on the heart, how changes occur during intimacy as we age, and safety during sexual activity.   Smoking Cessation / COPD -Discuss different methods to quit smoking, the health benefits of quitting smoking, and the definition of COPD.   Nutrition I: Fats -Discuss the types of cholesterol, what cholesterol does to the heart, and how cholesterol levels can be controlled.   Nutrition II: Labels -Discuss the different components of food labels and how to read food label   Heart Parts/Heart Disease and PAD -Discuss the anatomy of the heart, the pathway of blood circulation through the heart, and these are affected by heart disease.   Stress I: Signs and Symptoms -Discuss the causes of stress, how stress may lead to anxiety and depression, and ways to limit stress.   Stress II: Relaxation -Discuss different types of relaxation techniques to limit stress.   Warning Signs of Stroke / TIA -Discuss definition of a stroke, what the signs and symptoms are of a stroke, and how to identify when someone is having stroke.   Knowledge Questionnaire Score: Knowledge Questionnaire Score - 05/26/19 1128      Knowledge Questionnaire Score   Pre Score  20/24       Core Components/Risk Factors/Patient Goals at Admission: Personal Goals and Risk Factors at Admission - 05/26/19 1128      Core Components/Risk Factors/Patient Goals on Admission    Weight Management  Yes;Weight Maintenance    Intervention  Weight Management: Develop a combined nutrition and exercise program designed to reach desired caloric intake, while maintaining appropriate intake of nutrient and  fiber, sodium and fats, and appropriate energy expenditure required for the weight goal.;Weight Management: Provide education and appropriate resources to help participant work on and attain dietary goals.    Admit Weight  136 lb 14.5 oz (62.1 kg)    Expected Outcomes  Short Term:  Continue to assess and modify interventions until short term weight is achieved;Long Term: Adherence to nutrition and physical activity/exercise program aimed toward attainment of established weight goal;Weight Maintenance: Understanding of the daily nutrition guidelines, which includes 25-35% calories from fat, 7% or less cal from saturated fats, less than 200mg  cholesterol, less than 1.5gm of sodium, & 5 or more servings of fruits and vegetables daily;Understanding recommendations for meals to include 15-35% energy as protein, 25-35% energy from fat, 35-60% energy from carbohydrates, less than 200mg  of dietary cholesterol, 20-35 gm of total fiber daily;Understanding of distribution of calorie intake throughout the day with the consumption of 4-5 meals/snacks    Hypertension  Yes    Intervention  Provide education on lifestyle modifcations including regular physical activity/exercise, weight management, moderate sodium restriction and increased consumption of fresh fruit, vegetables, and low fat dairy, alcohol moderation, and smoking cessation.;Monitor prescription use compliance.    Expected Outcomes  Short Term: Continued assessment and intervention until BP is < 140/38mm HG in hypertensive participants. < 130/25mm HG in hypertensive participants with diabetes, heart failure or chronic kidney disease.;Long Term: Maintenance of blood pressure at goal levels.    Lipids  Yes    Intervention  Provide education and support for participant on nutrition & aerobic/resistive exercise along with prescribed medications to achieve LDL 70mg , HDL >40mg .    Expected Outcomes  Short Term: Participant states understanding of desired cholesterol  values and is compliant with medications prescribed. Participant is following exercise prescription and nutrition guidelines.;Long Term: Cholesterol controlled with medications as prescribed, with individualized exercise RX and with personalized nutrition plan. Value goals: LDL < 70mg , HDL > 40 mg.       Core Components/Risk Factors/Patient Goals Review:    Core Components/Risk Factors/Patient Goals at Discharge (Final Review):    ITP Comments: ITP Comments    Row Name 05/26/19 1010           ITP Comments  Dr. Fransico Him Medical Director Cardiac Rehab [facility]          Comments: Patient attended orientation on 05/26/2019 to review rules and guidelines for program.  Completed 6 minute walk test, Intitial ITP, and exercise prescription.  VSS. Telemetry-NSR.  Asymptomatic. Safety measures and social distancing in place per CDC guidelines.

## 2019-05-31 NOTE — Progress Notes (Signed)
Cardiology Office Note:    Date:  06/01/2019   ID:  Claudia Howell, DOB 1941/06/06, MRN [id number]  PCP:  Rita Ohara, MD  Cardiologist:  Sinclair Grooms, MD   Referring MD: Rita Ohara, MD   Chief Complaint  Patient presents with  . Atrial Fibrillation  . Coronary Artery Disease  . Hyperlipidemia    History of Present Illness:    Claudia Howell is a 78 y.o. female with a hx of coronary artery disease with bypass graft(January/2014), saphenous vein graft stent(10/2012), hypertension, acute on chronic systolic and diastolic heart failure, history of paroxysmal atrial fibrillation,Maze procedure doing coronary bypass grafting, and essential hypertension. Unstable angina 04/28/2019 treated with RCA CTO and stent (Martinique).  Claudia Howell feels much better than prior to her RCA CTO PCI by Dr. Martinique April 28, 2019.  I have discontinued Imdur because she was having headache.  Headache and dizziness has completely resolved.  Off isosorbide mononitrate she has not had angina.  She continues to stop statin therapy for any complaint.  She has been having some cramping in her legs and therefore stopped Crestor.  She started drinking quinine water each evening, around 3 ounces.  Cramping went away.  She has resumed Crestor.  She denies claudication.  She is suffering from some memory loss.  Past Medical History:  Diagnosis Date  . Adenomatous colon polyp 10/09  . CHF (congestive heart failure) ([place])   . Coronary artery disease    Last cath 12/14 Cath 12/8 100% SVG occlusion to RCA, 95% SVG.  The stenosis to OM.  LIMA to LAD patent but diffuse disease in LAD after graft.  Overlapping stents mid/distal SVG to OM.  Marland Kitchen Decubitus ulcer of coccyx 10/22/12   1/2 inch raw open area on coccyx  . Hyperlipidemia   . Hypertension    Dr.Kassim Guertin Tamala Julian  . Osteopenia 9/09  . PAF (paroxysmal atrial fibrillation) ([place])    during cath/notes 10/16/2012  . S/P CABG x 3 10/22/2012   LIMA to LAD, SVG to distal RCA, SVG  to OM, EVH from right thigh  . S/P Maze operation for atrial fibrillation 10/22/2012   Left side lesion set using bipolar radiofrequency and cryothermy ablation with clipping of LA appendage    Past Surgical History:  Procedure Laterality Date  . BREAST CYST EXCISION Left 1980-1990's   x 3  . CARDIAC CATHETERIZATION  10/16/2012  . CATARACT EXTRACTION, BILATERAL Bilateral    03/2015 and 05/2015  . CATARACT EXTRACTION, BILATERAL Bilateral approx 2016  . CORONARY ANGIOPLASTY WITH STENT PLACEMENT  04/01/2013   "1" (06/01/2013)  . CORONARY ARTERY BYPASS GRAFT N/A 10/22/2012   Procedure: CORONARY ARTERY BYPASS GRAFTING (CABG);  Surgeon: Rexene Alberts, MD;  Location: [place];  Service: Open Heart Surgery;  Laterality: N/A;  . CORONARY ATHERECTOMY N/A 04/29/2019   Procedure: CORONARY ATHERECTOMY;  Surgeon: Martinique, Peter M, MD;  Location: [place] [facility];  Service: Cardiovascular;  Laterality: N/A;  . CORONARY CTO INTERVENTION N/A 04/29/2019   Procedure: CORONARY CTO INTERVENTION;  Surgeon: Martinique, Peter M, MD;  Location: [place] [facility];  Service: Cardiovascular;  Laterality: N/A;  . CORONARY STENT INTERVENTION N/A 01/01/2019   Procedure: CORONARY STENT INTERVENTION;  Surgeon: Belva Crome, MD;  Location: [place] [facility];  Service: Cardiovascular;  Laterality: N/A;  . DILATION AND CURETTAGE OF UTERUS    . INTRAOPERATIVE TRANSESOPHAGEAL ECHOCARDIOGRAM N/A 10/22/2012   Procedure: INTRAOPERATIVE TRANSESOPHAGEAL ECHOCARDIOGRAM;  Surgeon: Rexene Alberts, MD;  Location:  [place] [facility];  Service: Open Heart Surgery;  Laterality: N/A;  . LEFT HEART CATH AND CORS/GRAFTS ANGIOGRAPHY N/A 01/01/2019   Procedure: LEFT HEART CATH AND CORS/GRAFTS ANGIOGRAPHY;  Surgeon: Belva Crome, MD;  Location: [place] [facility];  Service: Cardiovascular;  Laterality: N/A;  . LEFT HEART CATH AND CORS/GRAFTS ANGIOGRAPHY N/A 04/28/2019   Procedure: LEFT HEART CATH AND CORS/GRAFTS ANGIOGRAPHY;  Surgeon: Belva Crome, MD;  Location:  [place] [facility];  Service: Cardiovascular;  Laterality: N/A;  . LEFT HEART CATHETERIZATION WITH CORONARY ANGIOGRAM N/A 10/16/2012   Procedure: LEFT HEART CATHETERIZATION WITH CORONARY ANGIOGRAM;  Surgeon: Sinclair Grooms, MD;  Location: [facility];  Service: Cardiovascular;  Laterality: N/A;  . LEFT HEART CATHETERIZATION WITH CORONARY/GRAFT ANGIOGRAM N/A 06/01/2013   Procedure: LEFT HEART CATHETERIZATION WITH Beatrix Fetters;  Surgeon: Sinclair Grooms, MD;  Location: [facility];  Service: Cardiovascular;  Laterality: N/A;  . MAZE N/A 10/22/2012   Procedure: MAZE;  Surgeon: Rexene Alberts, MD;  Location: [place];  Service: Open Heart Surgery;  Laterality: N/A;  . PERCUTANEOUS CORONARY STENT INTERVENTION (PCI-S)  06/01/2013   Procedure: PERCUTANEOUS CORONARY STENT INTERVENTION (PCI-S);  Surgeon: Sinclair Grooms, MD;  Location: [facility] CATH LAB;  Service: Cardiovascular;;  . TONSILLECTOMY  1949  . TOTAL ABDOMINAL HYSTERECTOMY W/ BILATERAL SALPINGOOPHORECTOMY  1990   benign growth    Current Medications: Current Meds  Medication Sig  . aspirin EC 81 MG tablet Take 1 tablet (81 mg total) by mouth daily.  Marland Kitchen atenolol (TENORMIN) 100 MG tablet Take 100 mg by mouth daily.  . benazepril (LOTENSIN) 40 MG tablet Take 40 mg by mouth daily.  . cholecalciferol (VITAMIN D) 1000 UNITS tablet Take 1,000 Units by mouth daily.  . clopidogrel (PLAVIX) 75 MG tablet Take 1 tablet (75 mg total) by mouth daily with breakfast.  . Coenzyme Q10 (COQ10 PO) Take 1 capsule by mouth daily.  . furosemide (LASIX) 20 MG tablet Take 1 tablet (20 mg total) by mouth as needed for fluid or edema. Reported on 12/19/2015  . GLUCOSAMINE-CHONDROITIN-VIT D3 PO Take 1 tablet by mouth 2 (two) times a week.   . hydrochlorothiazide (HYDRODIURIL) 25 MG tablet Take 25 mg by mouth daily.  . Multiple Vitamin (MULTIVITAMIN) tablet Take 1 tablet by mouth daily.  . nitroGLYCERIN (NITROSTAT) 0.4 MG SL tablet Place 1 tablet (0.4 mg  total) under the tongue every 5 (five) minutes as needed for chest pain.  . potassium chloride (K-DUR) 10 MEQ tablet Take 10 mEq by mouth every 3 (three) days.   . rosuvastatin (CRESTOR) 10 MG tablet Take 1 tablet (10 mg total) by mouth daily.     Allergies:   Contrast media [iodinated diagnostic agents], Zetia [ezetimibe], Oxycodone, and Repatha [evolocumab]   Social History   Socioeconomic History  . Marital status: Widowed    Spouse name: Not on file  . Number of children: 2  . Years of education: 7  . Highest education level: Associate degree: occupational, Hotel manager, or vocational program  Occupational History  . Occupation: retired    Comment: LPN  Social Needs  . Financial resource strain: Not hard at all  . Food insecurity    Worry: Never true    Inability: Never true  . Transportation needs    Medical: No    Non-medical: No  Tobacco Use  . Smoking status: Never Smoker  . Smokeless tobacco: Never Used  Substance and Sexual Activity  . Alcohol use:  Yes    Alcohol/week: 1.0 standard drinks    Types: 1 Glasses of wine per week    Comment: 1 glass of wine 1 times/week  . Drug use: No  . Sexual activity: Not Currently  Lifestyle  . Physical activity    Days per week: 5 days    Minutes per session: 20 min  . Stress: Not at all  Relationships  . Social Herbalist on phone: Not on file    Gets together: Not on file    Attends religious service: Not on file    Active member of club or organization: Not on file    Attends meetings of clubs or organizations: Not on file    Relationship status: Widowed  Other Topics Concern  . 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  1 son in [place], 1 son in [place]. 7 grandchildren, 2 great granddaughters (in [place]), one more on the way     Family History: The patient's family history includes Cancer in her brother; Heart attack (age of onset: 71) in her mother; Heart disease in an other  family member; Heart disease (age of onset: 27) in her brother; Hypertension in her brother and mother. There is no history of Diabetes, Breast cancer, or Colon cancer.  ROS:   Please see the history of present illness.    Lower extremity cramping, better with quinine water.  Decreased memory.  All other systems reviewed and are negative.  EKGs/Labs/Other Studies Reviewed:    The following studies were reviewed today: Relatively recent PCI RCA by Dr. Martinique and a functionally occluded vessel that required rotational atherectomy to cross the stenosis after difficult wiring.  EKG:  EKG EKG is not performed today  Recent Labs: 04/24/2019: ALT 15; B Natriuretic Peptide 155.8 04/30/2019: BUN 44; Creatinine, Ser 0.88; Hemoglobin 10.1; Magnesium 2.0; Platelets 193; Potassium 3.3; Sodium 137; TSH 2.141  Recent Lipid Panel    Component Value Date/Time   CHOL 168 04/25/2019 0819   CHOL 189 02/04/2019 1157   TRIG 106 04/25/2019 0819   HDL 57 04/25/2019 0819   HDL 52 02/04/2019 1157   CHOLHDL 2.9 04/25/2019 0819   VLDL 21 04/25/2019 0819   LDLCALC 90 04/25/2019 0819   LDLCALC 111 (H) 02/04/2019 1157    Physical Exam:    VS:  BP (!) 142/76   Pulse 74   Ht 5' 4.75" (1.645 m)   Wt 135 lb 6.4 oz (61.4 kg)   SpO2 98%   BMI 22.71 kg/m     Wt Readings from Last 3 Encounters:  06/01/19 135 lb 6.4 oz (61.4 kg)  05/26/19 136 lb 14.5 oz (62.1 kg)  05/04/19 133 lb 9.6 oz (60.6 kg)     GEN: Compatible with age.  Slender.. No acute distress HEENT: Normal NECK: No JVD. LYMPHATICS: No lymphadenopathy CARDIAC:  RRR without murmur, gallop, or edema. VASCULAR:  Normal Pulses. No bruits. RESPIRATORY:  Clear to auscultation without rales, wheezing or rhonchi  ABDOMEN: Soft, non-tender, non-distended, No pulsatile mass, MUSCULOSKELETAL: No deformity  SKIN: Warm and dry NEUROLOGIC:  Alert and oriented x 3 PSYCHIATRIC:  Normal affect   ASSESSMENT:    1. Coronary artery disease of bypass graft  of native heart with stable angina pectoris (HCC)   2. Paroxysmal atrial fibrillation ([place])   3. Essential hypertension, benign   4. Chronic diastolic heart failure (HCC)   5. Other hyperlipidemia   6. S/P  Maze operation for atrial fibrillation   7. Educated about COVID-19 virus infection    PLAN:    In order of problems listed above:  1. Secondary prevention is discussed.  The importance of exercise and lipid lowering is again discussed. 2. No symptomatic recurrence 3. Blood pressure is marginal.  No additions or subtractions from the current regimen. 4. No clinical evidence of volume overload. 5. Encouraged the patient to stay on rosuvastatin 10 mg/day. 6. Not addressed 7. 3W's is being observed to avoid COVID-19 infection.  When she returns in 6 months we will do a lipid panel  Overall education and awareness concerning primary/secondary risk prevention was discussed in detail: LDL less than 70, hemoglobin A1c less than 7, blood pressure target less than 130/80 mmHg, >150 minutes of moderate aerobic activity per week, avoidance of smoking, weight control (via diet and exercise), and continued surveillance/management of/for obstructive sleep apnea.    Medication Adjustments/Labs and Tests Ordered: Current medicines are reviewed at length with the patient today.  Concerns regarding medicines are outlined above.  No orders of the defined types were placed in this encounter.  No orders of the defined types were placed in this encounter.   Patient Instructions  Medication Instructions:  Your physician recommends that you continue on your current medications as directed. Please refer to the Current Medication list given to you today.  *If you need a refill on your cardiac medications before your next appointment, please call your pharmacy*  Lab Work: None If you have labs (blood work) drawn today and your tests are completely normal, you will receive your results only by: Marland Kitchen  MyChart Message (if you have MyChart) OR . A paper copy in the mail If you have any lab test that is abnormal or we need to change your treatment, we will call you to review the results.  Testing/Procedures: None  Follow-Up: At [facility], you and your health needs are our priority.  As part of our continuing mission to provide you with exceptional heart care, we have created designated Provider Care Teams.  These Care Teams include your primary Cardiologist (physician) and Advanced Practice Providers (APPs -  Physician Assistants and Nurse Practitioners) who all work together to provide you with the care you need, when you need it.  Your next appointment:   12 month(s)  The format for your next appointment:   In Person  Provider:   You may see Sinclair Grooms, MD or one of the following Advanced Practice Providers on your designated Care Team:    Truitt Merle, NP  Cecilie Kicks, NP  Kathyrn Drown, NP   Other Instructions      Signed, Sinclair Grooms, MD  06/01/2019 11:12 AM    [place]

## 2019-06-01 ENCOUNTER — Encounter: Payer: Self-pay | Admitting: Interventional Cardiology

## 2019-06-01 ENCOUNTER — Ambulatory Visit (INDEPENDENT_AMBULATORY_CARE_PROVIDER_SITE_OTHER): Payer: Medicare Other | Admitting: Interventional Cardiology

## 2019-06-01 ENCOUNTER — Other Ambulatory Visit: Payer: Self-pay

## 2019-06-01 VITALS — BP 142/76 | HR 74 | Ht 64.75 in | Wt 135.4 lb

## 2019-06-01 DIAGNOSIS — Z7189 Other specified counseling: Secondary | ICD-10-CM

## 2019-06-01 DIAGNOSIS — I209 Angina pectoris, unspecified: Secondary | ICD-10-CM | POA: Diagnosis not present

## 2019-06-01 DIAGNOSIS — I480 Paroxysmal atrial fibrillation: Secondary | ICD-10-CM | POA: Diagnosis not present

## 2019-06-01 DIAGNOSIS — E7849 Other hyperlipidemia: Secondary | ICD-10-CM | POA: Diagnosis not present

## 2019-06-01 DIAGNOSIS — I5032 Chronic diastolic (congestive) heart failure: Secondary | ICD-10-CM | POA: Diagnosis not present

## 2019-06-01 DIAGNOSIS — I10 Essential (primary) hypertension: Secondary | ICD-10-CM | POA: Diagnosis not present

## 2019-06-01 DIAGNOSIS — Z8679 Personal history of other diseases of the circulatory system: Secondary | ICD-10-CM | POA: Diagnosis not present

## 2019-06-01 DIAGNOSIS — I25708 Atherosclerosis of coronary artery bypass graft(s), unspecified, with other forms of angina pectoris: Secondary | ICD-10-CM

## 2019-06-01 DIAGNOSIS — Z9889 Other specified postprocedural states: Secondary | ICD-10-CM

## 2019-06-01 NOTE — Patient Instructions (Signed)

## 2019-06-03 ENCOUNTER — Encounter (HOSPITAL_COMMUNITY)
Admission: RE | Admit: 2019-06-03 | Discharge: 2019-06-03 | Disposition: A | Discharge status: Home / Self Care | Payer: Medicare Other | Source: Ambulatory Visit | Attending: Interventional Cardiology | Admitting: Interventional Cardiology

## 2019-06-03 ENCOUNTER — Other Ambulatory Visit: Payer: Self-pay

## 2019-06-03 DIAGNOSIS — Z955 Presence of coronary angioplasty implant and graft: Secondary | ICD-10-CM | POA: Diagnosis present

## 2019-06-03 DIAGNOSIS — I214 Non-ST elevation (NSTEMI) myocardial infarction: Secondary | ICD-10-CM

## 2019-06-03 NOTE — Progress Notes (Signed)
Daily Session Note  Patient Details  Name: Claudia Howell MRN: [id number] Date of Birth: [birth year] Referring Provider:     CARDIAC REHAB PHASE II ORIENTATION from 05/26/2019 in [place]  Referring Provider  Dr. Tamala Julian      Encounter Date: 06/03/2019  Check In: Session Check In - 06/03/19 1106      Check-In   Supervising physician immediately available to respond to emergencies  [facility] immediately available    Physician(s)  Dr. Benny Lennert    Location  [facility]    Staff Present  Jiles Garter, RN, BSN;Lisa Ysidro Evert, RN;Olinty Celesta Aver, MS, ACSM CEP, Exercise Physiologist;Brittany Durene Fruits, BS, ACSM CEP, Exercise Physiologist;Maria Whitaker, RN, BSN    Virtual Visit  No    Medication changes reported      No    Fall or balance concerns reported     No    Tobacco Cessation  No Change    Warm-up and Cool-down  Performed on first and last piece of equipment    Resistance Training Performed  No    VAD Patient?  No    PAD/SET Patient?  No      Pain Assessment   Currently in Pain?  No/denies    Multiple Pain Sites  No       Capillary Blood Glucose: No results found for this or any previous visit (from the past 24 hour(s)).  Exercise Prescription Changes - 06/03/19 1106      Response to Exercise   Blood Pressure (Admit)  122/68    Blood Pressure (Exercise)  136/62    Blood Pressure (Exit)  126/64    Heart Rate (Admit)  71 bpm    Heart Rate (Exercise)  93 bpm    Heart Rate (Exit)  70 bpm    Rating of Perceived Exertion (Exercise)  12    Symptoms  none    Comments  Off to a good start with exercise.    Duration  Progress to 30 minutes of  aerobic without signs/symptoms of physical distress    Intensity  THRR unchanged      Progression   Progression  Continue to progress workloads to maintain intensity without signs/symptoms of physical distress.    Average METs  2.6      Resistance Training   Training Prescription  No    Relaxation day, no weights.     Interval Training   Interval Training  No      Treadmill   MPH  2.3    Grade  1    Minutes  15    METs  3.08      NuStep   Level  2    SPM  75    Minutes  15    METs  2.2       Social History   Tobacco Use  Smoking Status Never Smoker  Smokeless Tobacco Never Used    Goals Met:  Exercise tolerated well  Goals Unmet:  Not Applicable  Comments: Pt started cardiac rehab today.  Pt tolerated light exercise without difficulty. VSS, telemetry-SR, asymptomatic.  Medication list reconciled. Pt denies barriers to medicaiton compliance.  PSYCHOSOCIAL ASSESSMENT:  PHQ-0. Pt exhibits positive coping skills, hopeful outlook with supportive family. No psychosocial needs identified at this time, no psychosocial interventions necessary.   Pt oriented to exercise equipment and routine.    Understanding verbalized.    Dr. Fransico Him is Medical Director for Cardiac Rehab at [facility]  [facility].

## 2019-06-04 NOTE — Progress Notes (Signed)
Cardiac Individual Treatment Plan  Patient Details  Name: Claudia Howell MRN: [id number] Date of Birth: Dec 26, 1940 Referring Provider:     CARDIAC REHAB PHASE II ORIENTATION from 05/26/2019 in [place]  Referring Provider  Dr. Tamala Julian      Initial Encounter Date:    CARDIAC REHAB PHASE II ORIENTATION from 05/26/2019 in [place]  Date  05/26/19      Visit Diagnosis: NSTEMI (non-ST elevated myocardial infarction) [facility])  Coronary stent patent  Patient's Home Medications on Admission:  Current Outpatient Medications:  .  aspirin EC 81 MG tablet, Take 1 tablet (81 mg total) by mouth daily., Disp: , Rfl:  .  atenolol (TENORMIN) 100 MG tablet, Take 100 mg by mouth daily., Disp: , Rfl:  .  benazepril (LOTENSIN) 40 MG tablet, Take 40 mg by mouth daily., Disp: , Rfl:  .  cholecalciferol (VITAMIN D) 1000 UNITS tablet, Take 1,000 Units by mouth daily., Disp: , Rfl:  .  clopidogrel (PLAVIX) 75 MG tablet, Take 1 tablet (75 mg total) by mouth daily with breakfast., Disp: 90 tablet, Rfl: 3 .  Coenzyme Q10 (COQ10 PO), Take 1 capsule by mouth daily., Disp: , Rfl:  .  furosemide (LASIX) 20 MG tablet, Take 1 tablet (20 mg total) by mouth as needed for fluid or edema. Reported on 12/19/2015, Disp: 30 tablet, Rfl: 4 .  GLUCOSAMINE-CHONDROITIN-VIT D3 PO, Take 1 tablet by mouth 2 (two) times a week. , Disp: , Rfl:  .  hydrochlorothiazide (HYDRODIURIL) 25 MG tablet, Take 25 mg by mouth daily., Disp: , Rfl:  .  Multiple Vitamin (MULTIVITAMIN) tablet, Take 1 tablet by mouth daily., Disp: , Rfl:  .  nitroGLYCERIN (NITROSTAT) 0.4 MG SL tablet, Place 1 tablet (0.4 mg total) under the tongue every 5 (five) minutes as needed for chest pain., Disp: 25 tablet, Rfl: 3 .  potassium chloride (K-DUR) 10 MEQ tablet, Take 10 mEq by mouth every 3 (three) days. , Disp: , Rfl:  .  rosuvastatin (CRESTOR) 10 MG tablet, Take 1 tablet (10 mg total) by mouth daily.,  Disp: 90 tablet, Rfl: 3  Past Medical History: Past Medical History:  Diagnosis Date  . Adenomatous colon polyp 10/09  . CHF (congestive heart failure) ([place])   . Coronary artery disease    Last cath 12/14 Cath 12/8 100% SVG occlusion to RCA, 95% SVG.  The stenosis to OM.  LIMA to LAD patent but diffuse disease in LAD after graft.  Overlapping stents mid/distal SVG to OM.  Marland Kitchen Decubitus ulcer of coccyx 10/22/12   1/2 inch raw open area on coccyx  . Hyperlipidemia   . Hypertension    Dr.Henry Tamala Julian  . Osteopenia 9/09  . PAF (paroxysmal atrial fibrillation) ([place])    during cath/notes 10/16/2012  . S/P CABG x 3 10/22/2012   LIMA to LAD, SVG to distal RCA, SVG to OM, EVH from right thigh  . S/P Maze operation for atrial fibrillation 10/22/2012   Left side lesion set using bipolar radiofrequency and cryothermy ablation with clipping of LA appendage    Tobacco Use: Social History   Tobacco Use  Smoking Status Never Smoker  Smokeless Tobacco Never Used    Labs: Recent Review Flowsheet Data    Labs for ITP Cardiac and Pulmonary Rehab Latest Ref Rng & Units 09/06/2016 09/18/2017 12/20/2017 02/04/2019 04/25/2019   Cholestrol 0 - 200 mg/dL 190 177 161 189 168   LDLCALC 0 - 99 mg/dL 106(H)  96 84 111(H) 90   HDL >40 mg/dL 56 48 43 52 57   Trlycerides <150 mg/dL 139 167(H) 172(H) 132 106   Hemoglobin A1c <5.7 % - - - - -   PHART 7.350 - 7.450 - - - - -   PCO2ART 35.0 - 45.0 mmHg - - - - -   HCO3 20.0 - 24.0 mEq/L - - - - -   TCO2 0 - 100 mmol/L - - - - -   ACIDBASEDEF 0.0 - 2.0 mmol/L - - - - -   O2SAT % - - - - -      Capillary Blood Glucose: Lab Results  Component Value Date   GLUCAP 120 (H) 10/24/2012   GLUCAP 133 (H) 10/24/2012   GLUCAP 112 (H) 10/24/2012   GLUCAP 133 (H) 10/23/2012   GLUCAP 140 (H) 10/23/2012     Exercise Target Goals: Exercise Program Goal: Individual exercise prescription set using results from initial 6 min walk test and THRR while considering  patient's  activity barriers and safety.   Exercise Prescription Goal: Starting with aerobic activity 30 plus minutes a day, 3 days per week for initial exercise prescription. Provide home exercise prescription and guidelines that participant acknowledges understanding prior to discharge.  Activity Barriers & Risk Stratification: Activity Barriers & Cardiac Risk Stratification - 05/26/19 1122      Activity Barriers & Cardiac Risk Stratification   Activity Barriers  Balance Concerns;Deconditioning;Muscular Weakness    Cardiac Risk Stratification  High       6 Minute Walk: 6 Minute Walk    Row Name 05/26/19 1121         6 Minute Walk   Phase  Initial     Distance  1400 feet     Walk Time  6 minutes     # of Rest Breaks  0     MPH  2.6     METS  3     RPE  12     Perceived Dyspnea   0     VO2 Peak  10.6     Symptoms  No     Resting HR  74 bpm     Resting BP  120/62     Resting Oxygen Saturation   97 %     Exercise Oxygen Saturation  during 6 min walk  99 %     Max Ex. HR  109 bpm     Max Ex. BP  130/66     2 Minute Post BP  122/62        Oxygen Initial Assessment:   Oxygen Re-Evaluation:   Oxygen Discharge (Final Oxygen Re-Evaluation):   Initial Exercise Prescription: Initial Exercise Prescription - 05/26/19 1100      Date of Initial Exercise RX and Referring Provider   Date  05/26/19    Referring Provider  Dr. Tamala Julian    Expected Discharge Date  07/24/19      Treadmill   MPH  2.3    Grade  1    Minutes  15      NuStep   Level  2    SPM  75    Minutes  15    METs  2.8      Prescription Details   Frequency (times per week)  3    Duration  Progress to 30 minutes of continuous aerobic without signs/symptoms of physical distress      Intensity   THRR 40-80% of Max Heartrate  58-115    Ratings of Perceived Exertion  11-13      Progression   Progression  Continue to progress workloads to maintain intensity without signs/symptoms of physical distress.       Resistance Training   Training Prescription  Yes    Weight  3 lbs.     Reps  10-15       Perform Capillary Blood Glucose checks as needed.  Exercise Prescription Changes: Exercise Prescription Changes    Row Name 06/03/19 1106             Response to Exercise   Blood Pressure (Admit)  122/68       Blood Pressure (Exercise)  136/62       Blood Pressure (Exit)  126/64       Heart Rate (Admit)  71 bpm       Heart Rate (Exercise)  93 bpm       Heart Rate (Exit)  70 bpm       Rating of Perceived Exertion (Exercise)  12       Symptoms  none       Comments  Off to a good start with exercise.       Duration  Progress to 30 minutes of  aerobic without signs/symptoms of physical distress       Intensity  THRR unchanged         Progression   Progression  Continue to progress workloads to maintain intensity without signs/symptoms of physical distress.       Average METs  2.6         Resistance Training   Training Prescription  No Relaxation day, no weights.         Interval Training   Interval Training  No         Treadmill   MPH  2.3       Grade  1       Minutes  15       METs  3.08         NuStep   Level  2       SPM  75       Minutes  15       METs  2.2          Exercise Comments: Exercise Comments    Row Name 06/03/19 1159           Exercise Comments  Patient tolerated 1st session of exercise well without symptoms.          Exercise Goals and Review: Exercise Goals    Row Name 05/26/19 1124             Exercise Goals   Increase Physical Activity  Yes       Intervention  Provide advice, education, support and counseling about physical activity/exercise needs.;Develop an individualized exercise prescription for aerobic and resistive training based on initial evaluation findings, risk stratification, comorbidities and participant's personal goals.       Expected Outcomes  Short Term: Attend rehab on a regular basis to increase amount of physical  activity.;Long Term: Add in home exercise to make exercise part of routine and to increase amount of physical activity.;Long Term: Exercising regularly at least 3-5 days a week.       Increase Strength and Stamina  Yes       Intervention  Provide advice, education, support and counseling about physical activity/exercise needs.;Develop an individualized exercise prescription for aerobic and resistive  training based on initial evaluation findings, risk stratification, comorbidities and participant's personal goals.       Expected Outcomes  Short Term: Increase workloads from initial exercise prescription for resistance, speed, and METs.;Short Term: Perform resistance training exercises routinely during rehab and add in resistance training at home;Long Term: Improve cardiorespiratory fitness, muscular endurance and strength as measured by increased METs and functional capacity (6MWT)       Able to understand and use rate of perceived exertion (RPE) scale  Yes       Intervention  Provide education and explanation on how to use RPE scale       Expected Outcomes  Short Term: Able to use RPE daily in rehab to express subjective intensity level;Long Term:  Able to use RPE to guide intensity level when exercising independently       Knowledge and understanding of Target Heart Rate Range (THRR)  Yes       Intervention  Provide education and explanation of THRR including how the numbers were predicted and where they are located for reference       Expected Outcomes  Short Term: Able to state/look up THRR;Long Term: Able to use THRR to govern intensity when exercising independently;Short Term: Able to use daily as guideline for intensity in rehab       Able to check pulse independently  Yes       Intervention  Provide education and demonstration on how to check pulse in carotid and radial arteries.;Review the importance of being able to check your own pulse for safety during independent exercise       Expected  Outcomes  Short Term: Able to explain why pulse checking is important during independent exercise;Long Term: Able to check pulse independently and accurately       Understanding of Exercise Prescription  Yes       Intervention  Provide education, explanation, and written materials on patient's individual exercise prescription       Expected Outcomes  Short Term: Able to explain program exercise prescription;Long Term: Able to explain home exercise prescription to exercise independently          Exercise Goals Re-Evaluation : Exercise Goals Re-Evaluation    [place] Name 06/03/19 1159             Exercise Goal Re-Evaluation   Exercise Goals Review  Able to understand and use rate of perceived exertion (RPE) scale;Increase Physical Activity       Comments  Patient able to understand and use RPE scale appropriately.       Expected Outcomes  Progress workloads as tolerated to help increase energy level.           Discharge Exercise Prescription (Final Exercise Prescription Changes): Exercise Prescription Changes - 06/03/19 1106      Response to Exercise   Blood Pressure (Admit)  122/68    Blood Pressure (Exercise)  136/62    Blood Pressure (Exit)  126/64    Heart Rate (Admit)  71 bpm    Heart Rate (Exercise)  93 bpm    Heart Rate (Exit)  70 bpm    Rating of Perceived Exertion (Exercise)  12    Symptoms  none    Comments  Off to a good start with exercise.    Duration  Progress to 30 minutes of  aerobic without signs/symptoms of physical distress    Intensity  THRR unchanged      Progression   Progression  Continue to progress workloads  to maintain intensity without signs/symptoms of physical distress.    Average METs  2.6      Resistance Training   Training Prescription  No   Relaxation day, no weights.     Interval Training   Interval Training  No      Treadmill   MPH  2.3    Grade  1    Minutes  15    METs  3.08      NuStep   Level  2    SPM  75    Minutes  15     METs  2.2       Nutrition:  Target Goals: Understanding of nutrition guidelines, daily intake of sodium 1500mg , cholesterol 200mg , calories 30% from fat and 7% or less from saturated fats, daily to have 5 or more servings of fruits and vegetables.  Biometrics: Pre Biometrics - 05/26/19 1124      Pre Biometrics   Height  5' 4.75" (1.645 m)    Weight  62.1 kg    Waist Circumference  34.5 inches    Hip Circumference  38.5 inches    Waist to Hip Ratio  0.9 %    BMI (Calculated)  22.95    Triceps Skinfold  21 mm    % Body Fat  35.4 %    Grip Strength  23 kg    Flexibility  13.5 in    Single Leg Stand  2.18 seconds        Nutrition Therapy Plan and Nutrition Goals:   Nutrition Assessments:   Nutrition Goals Re-Evaluation:   Nutrition Goals Discharge (Final Nutrition Goals Re-Evaluation):   Psychosocial: Target Goals: Acknowledge presence or absence of significant depression and/or stress, maximize coping skills, provide positive support system. Participant is able to verbalize types and ability to use techniques and skills needed for reducing stress and depression.  Initial Review & Psychosocial Screening: Initial Psych Review & Screening - 05/26/19 1045      Initial Review   Current issues with  None Identified      Family Dynamics   Good Support System?  Yes    Comments  Claudia Howell denies psychosocial barriers to participation in cardiac rehab or self care/health management. She acknolodges a strong support system including her family, friends, and health care team. She has a positive attitude and outlook.      Barriers   Psychosocial barriers to participate in program  There are no identifiable barriers or psychosocial needs.       Quality of Life Scores: Quality of Life - 05/26/19 1126      Quality of Life   Select  Quality of Life      Quality of Life Scores   Health/Function Pre  22.89 %    Socioeconomic Pre  27.75 %    Psych/Spiritual Pre  26.64 %     Family Pre  24.5 %    GLOBAL Pre  24.89 %      Scores of 19 and below usually indicate a poorer quality of life in these areas.  A difference of  2-3 points is a clinically meaningful difference.  A difference of 2-3 points in the total score of the Quality of Life Index has been associated with significant improvement in overall quality of life, self-image, physical symptoms, and general health in studies assessing change in quality of life.  PHQ-9: Recent Review Flowsheet Data    Depression screen [facility] 2/9 05/26/2019 09/24/2018 09/19/2017 09/06/2016 09/09/2014  Decreased Interest 0 0 0 0 0   Down, Depressed, Hopeless 0 0 0 0 0   PHQ - 2 Score 0 0 0 0 0     Interpretation of Total Score  Total Score Depression Severity:  1-4 = Minimal depression, 5-9 = Mild depression, 10-14 = Moderate depression, 15-19 = Moderately severe depression, 20-27 = Severe depression   Psychosocial Evaluation and Intervention: Psychosocial Evaluation - 06/03/19 1638      Psychosocial Evaluation & Interventions   Interventions  Encouraged to exercise with the program and follow exercise prescription    Comments  No psychosocial interventions necessary.  Claudia Howell enjoys playing cards.    Expected Outcomes  Claudia Howell will continue to maintain a positive outlook.    Continue Psychosocial Services   No Follow up required       Psychosocial Re-Evaluation:   Psychosocial Discharge (Final Psychosocial Re-Evaluation):   Vocational Rehabilitation: Provide vocational rehab assistance to qualifying candidates.   Vocational Rehab Evaluation & Intervention: Vocational Rehab - 05/26/19 1044      Initial Vocational Rehab Evaluation & Intervention   Assessment shows need for Vocational Rehabilitation  No       Education: Education Goals: Education classes will be provided on a weekly basis, covering required topics. Participant will state understanding/return demonstration of topics presented.  Learning  Barriers/Preferences: Learning Barriers/Preferences - 05/26/19 1127      Learning Barriers/Preferences   Learning Barriers  None    Learning Preferences  Written Material;Skilled Demonstration;Video;Audio;Pictoral;Verbal Instruction;Individual Instruction;Group Instruction       Education Topics: Hypertension, Hypertension Reduction -Define heart disease and high blood pressure. Discus how high blood pressure affects the body and ways to reduce high blood pressure.   Exercise and Your Heart -Discuss why it is important to exercise, the FITT principles of exercise, normal and abnormal responses to exercise, and how to exercise safely.   Angina -Discuss definition of angina, causes of angina, treatment of angina, and how to decrease risk of having angina.   Cardiac Medications -Review what the following cardiac medications are used for, how they affect the body, and side effects that may occur when taking the medications.  Medications include Aspirin, Beta blockers, calcium channel blockers, ACE Inhibitors, angiotensin receptor blockers, diuretics, digoxin, and antihyperlipidemics.   Congestive Heart Failure -Discuss the definition of CHF, how to live with CHF, the signs and symptoms of CHF, and how keep track of weight and sodium intake.   Heart Disease and Intimacy -Discus the effect sexual activity has on the heart, how changes occur during intimacy as we age, and safety during sexual activity.   Smoking Cessation / COPD -Discuss different methods to quit smoking, the health benefits of quitting smoking, and the definition of COPD.   Nutrition I: Fats -Discuss the types of cholesterol, what cholesterol does to the heart, and how cholesterol levels can be controlled.   Nutrition II: Labels -Discuss the different components of food labels and how to read food label   Heart Parts/Heart Disease and PAD -Discuss the anatomy of the heart, the pathway of blood circulation  through the heart, and these are affected by heart disease.   Stress I: Signs and Symptoms -Discuss the causes of stress, how stress may lead to anxiety and depression, and ways to limit stress.   Stress II: Relaxation -Discuss different types of relaxation techniques to limit stress.   Warning Signs of Stroke / TIA -Discuss definition of a stroke, what the signs and symptoms are of a stroke,  and how to identify when someone is having stroke.   Knowledge Questionnaire Score: Knowledge Questionnaire Score - 05/26/19 1128      Knowledge Questionnaire Score   Pre Score  20/24       Core Components/Risk Factors/Patient Goals at Admission: Personal Goals and Risk Factors at Admission - 05/26/19 1128      Core Components/Risk Factors/Patient Goals on Admission    Weight Management  Yes;Weight Maintenance    Intervention  Weight Management: Develop a combined nutrition and exercise program designed to reach desired caloric intake, while maintaining appropriate intake of nutrient and fiber, sodium and fats, and appropriate energy expenditure required for the weight goal.;Weight Management: Provide education and appropriate resources to help participant work on and attain dietary goals.    Admit Weight  136 lb 14.5 oz (62.1 kg)    Expected Outcomes  Short Term: Continue to assess and modify interventions until short term weight is achieved;Long Term: Adherence to nutrition and physical activity/exercise program aimed toward attainment of established weight goal;Weight Maintenance: Understanding of the daily nutrition guidelines, which includes 25-35% calories from fat, 7% or less cal from saturated fats, less than 200mg  cholesterol, less than 1.5gm of sodium, & 5 or more servings of fruits and vegetables daily;Understanding recommendations for meals to include 15-35% energy as protein, 25-35% energy from fat, 35-60% energy from carbohydrates, less than 200mg  of dietary cholesterol, 20-35 gm of  total fiber daily;Understanding of distribution of calorie intake throughout the day with the consumption of 4-5 meals/snacks    Hypertension  Yes    Intervention  Provide education on lifestyle modifcations including regular physical activity/exercise, weight management, moderate sodium restriction and increased consumption of fresh fruit, vegetables, and low fat dairy, alcohol moderation, and smoking cessation.;Monitor prescription use compliance.    Expected Outcomes  Short Term: Continued assessment and intervention until BP is < 140/24mm HG in hypertensive participants. < 130/70mm HG in hypertensive participants with diabetes, heart failure or chronic kidney disease.;Long Term: Maintenance of blood pressure at goal levels.    Lipids  Yes    Intervention  Provide education and support for participant on nutrition & aerobic/resistive exercise along with prescribed medications to achieve LDL 70mg , HDL >40mg .    Expected Outcomes  Short Term: Participant states understanding of desired cholesterol values and is compliant with medications prescribed. Participant is following exercise prescription and nutrition guidelines.;Long Term: Cholesterol controlled with medications as prescribed, with individualized exercise RX and with personalized nutrition plan. Value goals: LDL < 70mg , HDL > 40 mg.       Core Components/Risk Factors/Patient Goals Review:  Goals and Risk Factor Review    Row Name 06/03/19 1640             Core Components/Risk Factors/Patient Goals Review   Personal Goals Review  Weight Management/Obesity;Hypertension;Lipids       Review  Pt with few CADs willing to participate in CR exercise.  Claudia Howell would like to increase her energy and maintain energy levels.       Expected Outcomes  Pt will continue to participate in CR exercise, nutrition, and lifestyle modification opportunities.          Core Components/Risk Factors/Patient Goals at Discharge (Final Review):  Goals and Risk  Factor Review - 06/03/19 1640      Core Components/Risk Factors/Patient Goals Review   Personal Goals Review  Weight Management/Obesity;Hypertension;Lipids    Review  Pt with few CADs willing to participate in CR exercise.  Claudia Howell would like to increase  her energy and maintain energy levels.    Expected Outcomes  Pt will continue to participate in CR exercise, nutrition, and lifestyle modification opportunities.       ITP Comments: ITP Comments    Row Name 05/26/19 1010 06/03/19 1217         ITP Comments  Dr. Fransico Him Medical Director Cardiac Rehab [place]  30 Day ITP Review. Pt started exercise today and tolerated it well.         Comments: see ITP comments

## 2019-06-05 ENCOUNTER — Other Ambulatory Visit: Payer: Self-pay

## 2019-06-05 ENCOUNTER — Encounter (HOSPITAL_COMMUNITY)
Admission: RE | Admit: 2019-06-05 | Discharge: 2019-06-05 | Disposition: A | Discharge status: Home / Self Care | Payer: Medicare Other | Source: Ambulatory Visit | Attending: Interventional Cardiology | Admitting: Interventional Cardiology

## 2019-06-05 DIAGNOSIS — I214 Non-ST elevation (NSTEMI) myocardial infarction: Secondary | ICD-10-CM

## 2019-06-05 DIAGNOSIS — Z955 Presence of coronary angioplasty implant and graft: Secondary | ICD-10-CM

## 2019-06-08 ENCOUNTER — Encounter (HOSPITAL_COMMUNITY): Payer: Medicare Other

## 2019-06-10 ENCOUNTER — Other Ambulatory Visit: Payer: Self-pay

## 2019-06-10 ENCOUNTER — Encounter (HOSPITAL_COMMUNITY)
Admission: RE | Admit: 2019-06-10 | Discharge: 2019-06-10 | Disposition: A | Discharge status: Home / Self Care | Payer: Medicare Other | Source: Ambulatory Visit | Attending: Interventional Cardiology | Admitting: Interventional Cardiology

## 2019-06-10 DIAGNOSIS — I214 Non-ST elevation (NSTEMI) myocardial infarction: Secondary | ICD-10-CM | POA: Diagnosis not present

## 2019-06-10 DIAGNOSIS — Z955 Presence of coronary angioplasty implant and graft: Secondary | ICD-10-CM

## 2019-06-10 NOTE — Progress Notes (Signed)
Claudia Howell 78 y.o. female Nutrition Note  Visit Diagnosis: NSTEMI (non-ST elevated myocardial infarction) [facility])  Coronary stent patent  Past Medical History:  Diagnosis Date  . Adenomatous colon polyp 10/09  . CHF (congestive heart failure) ([place])   . Coronary artery disease    Last cath 12/14 Cath 12/8 100% SVG occlusion to RCA, 95% SVG.  The stenosis to OM.  LIMA to LAD patent but diffuse disease in LAD after graft.  Overlapping stents mid/distal SVG to OM.  Marland Kitchen Decubitus ulcer of coccyx 10/22/12   1/2 inch raw open area on coccyx  . Hyperlipidemia   . Hypertension    Dr.Henry Tamala Julian  . Osteopenia 9/09  . PAF (paroxysmal atrial fibrillation) ([place])    during cath/notes 10/16/2012  . S/P CABG x 3 10/22/2012   LIMA to LAD, SVG to distal RCA, SVG to OM, EVH from right thigh  . S/P Maze operation for atrial fibrillation 10/22/2012   Left side lesion set using bipolar radiofrequency and cryothermy ablation with clipping of LA appendage     Medications reviewed.   Current Outpatient Medications:  .  aspirin EC 81 MG tablet, Take 1 tablet (81 mg total) by mouth daily., Disp: , Rfl:  .  atenolol (TENORMIN) 100 MG tablet, Take 100 mg by mouth daily., Disp: , Rfl:  .  benazepril (LOTENSIN) 40 MG tablet, Take 40 mg by mouth daily., Disp: , Rfl:  .  cholecalciferol (VITAMIN D) 1000 UNITS tablet, Take 1,000 Units by mouth daily., Disp: , Rfl:  .  clopidogrel (PLAVIX) 75 MG tablet, Take 1 tablet (75 mg total) by mouth daily with breakfast., Disp: 90 tablet, Rfl: 3 .  Coenzyme Q10 (COQ10 PO), Take 1 capsule by mouth daily., Disp: , Rfl:  .  furosemide (LASIX) 20 MG tablet, Take 1 tablet (20 mg total) by mouth as needed for fluid or edema. Reported on 12/19/2015, Disp: 30 tablet, Rfl: 4 .  GLUCOSAMINE-CHONDROITIN-VIT D3 PO, Take 1 tablet by mouth 2 (two) times a week. , Disp: , Rfl:  .  hydrochlorothiazide (HYDRODIURIL) 25 MG tablet, Take 25 mg by mouth daily., Disp: , Rfl:  .  Multiple Vitamin  (MULTIVITAMIN) tablet, Take 1 tablet by mouth daily., Disp: , Rfl:  .  nitroGLYCERIN (NITROSTAT) 0.4 MG SL tablet, Place 1 tablet (0.4 mg total) under the tongue every 5 (five) minutes as needed for chest pain., Disp: 25 tablet, Rfl: 3 .  potassium chloride (K-DUR) 10 MEQ tablet, Take 10 mEq by mouth every 3 (three) days. , Disp: , Rfl:  .  rosuvastatin (CRESTOR) 10 MG tablet, Take 1 tablet (10 mg total) by mouth daily., Disp: 90 tablet, Rfl: 3   Ht Readings from Last 1 Encounters:  06/01/19 5' 4.75" (1.645 m)     Wt Readings from Last 3 Encounters:  06/01/19 135 lb 6.4 oz (61.4 kg)  05/26/19 136 lb 14.5 oz (62.1 kg)  05/04/19 133 lb 9.6 oz (60.6 kg)     There is no height or weight on file to calculate BMI.   Social History   Tobacco Use  Smoking Status Never Smoker  Smokeless Tobacco Never Used     Lab Results  Component Value Date   CHOL 168 04/25/2019   Lab Results  Component Value Date   HDL 57 04/25/2019   Lab Results  Component Value Date   LDLCALC 90 04/25/2019   Lab Results  Component Value Date   TRIG 106 04/25/2019   Lab Results  Component Value Date   CHOLHDL 2.9 04/25/2019     Lab Results  Component Value Date   HGBA1C 5.5 08/10/2013     CBG (last 3)  No results for input(s): GLUCAP in the last 72 hours.   Nutrition Note  Spoke with pt. Nutrition Plan and Nutrition Survey goals reviewed with pt. Pt is following a Heart Healthy diet.   Per discussion, pt does not use canned/convenience foods often. Pt does not add salt to food. Pt does not eat out frequently.   Pt expressed understanding of the information reviewed.   Nutrition Diagnosis ? Food-and nutrition-related knowledge deficit related to lack of exposure to information as related to diagnosis of: ? CVD ?   Nutrition Intervention ? Pt's individual nutrition plan reviewed with pt. ? Benefits of adopting Heart Healthy diet discussed when Medficts reviewed.   ? Continue  client-centered nutrition education by RD, as part of interdisciplinary care.  Goal(s) ? Pt to build a healthy plate including vegetables, fruits, whole grains, and low-fat dairy products in a heart healthy meal plan.  Plan:   Will provide client-centered nutrition education as part of interdisciplinary care  Monitor and evaluate progress toward nutrition goal with team.   Michaele Offer, MS, RDN, LDN

## 2019-06-12 ENCOUNTER — Encounter (HOSPITAL_COMMUNITY)
Admission: RE | Admit: 2019-06-12 | Discharge: 2019-06-12 | Disposition: A | Discharge status: Home / Self Care | Payer: Medicare Other | Source: Ambulatory Visit | Attending: Interventional Cardiology | Admitting: Interventional Cardiology

## 2019-06-12 ENCOUNTER — Other Ambulatory Visit: Payer: Self-pay

## 2019-06-12 DIAGNOSIS — I214 Non-ST elevation (NSTEMI) myocardial infarction: Secondary | ICD-10-CM | POA: Diagnosis not present

## 2019-06-12 DIAGNOSIS — Z955 Presence of coronary angioplasty implant and graft: Secondary | ICD-10-CM

## 2019-06-15 ENCOUNTER — Other Ambulatory Visit: Payer: Self-pay

## 2019-06-15 ENCOUNTER — Encounter (HOSPITAL_COMMUNITY)
Admission: RE | Admit: 2019-06-15 | Discharge: 2019-06-15 | Disposition: A | Discharge status: Home / Self Care | Payer: Medicare Other | Source: Ambulatory Visit | Attending: Interventional Cardiology | Admitting: Interventional Cardiology

## 2019-06-15 DIAGNOSIS — Z955 Presence of coronary angioplasty implant and graft: Secondary | ICD-10-CM

## 2019-06-15 DIAGNOSIS — I214 Non-ST elevation (NSTEMI) myocardial infarction: Secondary | ICD-10-CM | POA: Diagnosis not present

## 2019-06-16 NOTE — Progress Notes (Signed)
Reviewed home exercise guidelines with patient including endpoints, temperature precautions, target heart rate and rate of perceived exertion. Pt is walking ~45 minutes daily as her mode of home exercise and has 3 lb & 5 lb weights for her resistance training. Reviewed pulse counting and handout given. Pt voices understanding of instructions given.  Sol Passer, MS, ACSM CEP

## 2019-06-17 ENCOUNTER — Encounter (HOSPITAL_COMMUNITY)
Admission: RE | Admit: 2019-06-17 | Discharge: 2019-06-17 | Disposition: A | Discharge status: Home / Self Care | Payer: Medicare Other | Source: Ambulatory Visit | Attending: Interventional Cardiology | Admitting: Interventional Cardiology

## 2019-06-17 ENCOUNTER — Other Ambulatory Visit: Payer: Self-pay

## 2019-06-17 DIAGNOSIS — I214 Non-ST elevation (NSTEMI) myocardial infarction: Secondary | ICD-10-CM

## 2019-06-17 DIAGNOSIS — Z955 Presence of coronary angioplasty implant and graft: Secondary | ICD-10-CM

## 2019-06-22 ENCOUNTER — Encounter (HOSPITAL_COMMUNITY)
Admission: RE | Admit: 2019-06-22 | Discharge: 2019-06-22 | Disposition: A | Discharge status: Home / Self Care | Payer: Medicare Other | Source: Ambulatory Visit | Attending: Interventional Cardiology | Admitting: Interventional Cardiology

## 2019-06-22 ENCOUNTER — Other Ambulatory Visit: Payer: Self-pay

## 2019-06-22 DIAGNOSIS — I214 Non-ST elevation (NSTEMI) myocardial infarction: Secondary | ICD-10-CM | POA: Diagnosis not present

## 2019-06-22 DIAGNOSIS — Z955 Presence of coronary angioplasty implant and graft: Secondary | ICD-10-CM

## 2019-06-24 ENCOUNTER — Other Ambulatory Visit: Payer: Self-pay

## 2019-06-24 ENCOUNTER — Encounter (HOSPITAL_COMMUNITY)
Admission: RE | Admit: 2019-06-24 | Discharge: 2019-06-24 | Disposition: A | Discharge status: Home / Self Care | Payer: Medicare Other | Source: Ambulatory Visit | Attending: Interventional Cardiology | Admitting: Interventional Cardiology

## 2019-06-24 VITALS — BP 100/62 | HR 70 | Temp 96.7°F | Wt 134.5 lb

## 2019-06-24 DIAGNOSIS — I214 Non-ST elevation (NSTEMI) myocardial infarction: Secondary | ICD-10-CM

## 2019-06-24 DIAGNOSIS — Z955 Presence of coronary angioplasty implant and graft: Secondary | ICD-10-CM

## 2019-06-29 ENCOUNTER — Encounter (HOSPITAL_COMMUNITY): Payer: Medicare Other

## 2019-06-29 NOTE — Progress Notes (Signed)
Discharge Progress Report  Patient Details  Name: Claudia Howell MRN: [id number] Date of Birth: July 20, 1940 Referring Provider:     CARDIAC REHAB PHASE II ORIENTATION from 05/26/2019 in [place]  Referring Provider  Dr. Tamala Julian       Number of Visits: 7  Reason for Discharge:  Early Exit:  Patient choses to not enroll in virtual CR or telephone follow-ups during closure to in-person CR exercise sessions.  Smoking History:  Social History   Tobacco Use  Smoking Status Never Smoker  Smokeless Tobacco Never Used    Diagnosis:  Coronary stent patent  NSTEMI (non-ST elevated myocardial infarction) ([place])  ADL [facility]SD:   Initial Exercise Prescription: Initial Exercise Prescription - 05/26/19 1100      Date of Initial Exercise RX and Referring Provider   Date  05/26/19    Referring Provider  Dr. Tamala Julian    Expected Discharge Date  07/24/19      Treadmill   MPH  2.3    Grade  1    Minutes  15      NuStep   Level  2    SPM  75    Minutes  15    METs  2.8      Prescription Details   Frequency (times per week)  3    Duration  Progress to 30 minutes of continuous aerobic without signs/symptoms of physical distress      Intensity   THRR 40-80% of Max Heartrate  58-115    Ratings of Perceived Exertion  11-13      Progression   Progression  Continue to progress workloads to maintain intensity without signs/symptoms of physical distress.      Resistance Training   Training Prescription  Yes    Weight  3 lbs.     Reps  10-15       Discharge Exercise Prescription (Final Exercise Prescription Changes): Exercise Prescription Changes - 06/24/19 1102      Response to Exercise   Blood Pressure (Admit)  100/62    Blood Pressure (Exercise)  118/78    Blood Pressure (Exit)  148/56    Heart Rate (Admit)  70 bpm    Heart Rate (Exercise)  107 bpm    Heart Rate (Exit)  71 bpm    Rating of Perceived Exertion (Exercise)  13    Symptoms  none     Duration  Progress to 30 minutes of  aerobic without signs/symptoms of physical distress    Intensity  THRR unchanged      Progression   Progression  Continue to progress workloads to maintain intensity without signs/symptoms of physical distress.    Average METs  2.8      Resistance Training   Training Prescription  No   Relaxation day, no weights.     Interval Training   Interval Training  No      Treadmill   MPH  2.1    Grade  2    Minutes  15    METs  3.19      NuStep   Level  3    SPM  75    Minutes  15    METs  2.4      Home Exercise Plan   Plans to continue exercise at  Home (comment)   Walking   Frequency  Add 4 additional days to program exercise sessions.    Initial Home Exercises Provided  06/15/19  Functional Capacity: 6 Minute Walk    Row Name 05/26/19 1121         6 Minute Walk   Phase  Initial     Distance  1400 feet     Walk Time  6 minutes     # of Rest Breaks  0     MPH  2.6     METS  3     RPE  12     Perceived Dyspnea   0     VO2 Peak  10.6     Symptoms  No     Resting HR  74 bpm     Resting BP  120/62     Resting Oxygen Saturation   97 %     Exercise Oxygen Saturation  during 6 min walk  99 %     Max Ex. HR  109 bpm     Max Ex. BP  130/66     2 Minute Post BP  122/62        Psychological, QOL, Others - Outcomes: PHQ 2/9: Depression screen [facility] 2/9 05/26/2019 09/24/2018 09/19/2017 09/06/2016 09/09/2014  Decreased Interest 0 0 0 0 0  Down, Depressed, Hopeless 0 0 0 0 0  PHQ - 2 Score 0 0 0 0 0    Quality of Life: Quality of Life - 05/26/19 1126      Quality of Life   Select  Quality of Life      Quality of Life Scores   Health/Function Pre  22.89 %    Socioeconomic Pre  27.75 %    Psych/Spiritual Pre  26.64 %    Family Pre  24.5 %    GLOBAL Pre  24.89 %       Personal Goals: Goals established at orientation with interventions provided to work toward goal. Personal Goals and Risk Factors at Admission - 05/26/19 1128       Core Components/Risk Factors/Patient Goals on Admission    Weight Management  Yes;Weight Maintenance    Intervention  Weight Management: Develop a combined nutrition and exercise program designed to reach desired caloric intake, while maintaining appropriate intake of nutrient and fiber, sodium and fats, and appropriate energy expenditure required for the weight goal.;Weight Management: Provide education and appropriate resources to help participant work on and attain dietary goals.    Admit Weight  136 lb 14.5 oz (62.1 kg)    Expected Outcomes  Short Term: Continue to assess and modify interventions until short term weight is achieved;Long Term: Adherence to nutrition and physical activity/exercise program aimed toward attainment of established weight goal;Weight Maintenance: Understanding of the daily nutrition guidelines, which includes 25-35% calories from fat, 7% or less cal from saturated fats, less than 200mg  cholesterol, less than 1.5gm of sodium, & 5 or more servings of fruits and vegetables daily;Understanding recommendations for meals to include 15-35% energy as protein, 25-35% energy from fat, 35-60% energy from carbohydrates, less than 200mg  of dietary cholesterol, 20-35 gm of total fiber daily;Understanding of distribution of calorie intake throughout the day with the consumption of 4-5 meals/snacks    Hypertension  Yes    Intervention  Provide education on lifestyle modifcations including regular physical activity/exercise, weight management, moderate sodium restriction and increased consumption of fresh fruit, vegetables, and low fat dairy, alcohol moderation, and smoking cessation.;Monitor prescription use compliance.    Expected Outcomes  Short Term: Continued assessment and intervention until BP is < 140/30mm HG in hypertensive participants. < 130/15mm HG in hypertensive  participants with diabetes, heart failure or chronic kidney disease.;Long Term: Maintenance of blood pressure at  goal levels.    Lipids  Yes    Intervention  Provide education and support for participant on nutrition & aerobic/resistive exercise along with prescribed medications to achieve LDL 70mg , HDL >40mg .    Expected Outcomes  Short Term: Participant states understanding of desired cholesterol values and is compliant with medications prescribed. Participant is following exercise prescription and nutrition guidelines.;Long Term: Cholesterol controlled with medications as prescribed, with individualized exercise RX and with personalized nutrition plan. Value goals: LDL < 70mg , HDL > 40 mg.        Personal Goals Discharge: Goals and Risk Factor Review    Row Name 06/03/19 1640 06/23/19 0916 06/29/19 0933         Core Components/Risk Factors/Patient Goals Review   Personal Goals Review  Weight Management/Obesity;Hypertension;Lipids  Weight Management/Obesity;Hypertension;Lipids  --     Review  Pt with few CADs willing to participate in CR exercise.  Chrishana would like to increase her energy and maintain energy levels.  Pt with few CADs willing to participate in CR exercise.  Christien would like to increase her energy and maintain energy levels.  Pt with few CADs willing to participate in CR exercise.  Danaya would like to increase her energy and maintain energy levels.     Expected Outcomes  Pt will continue to participate in CR exercise, nutrition, and lifestyle modification opportunities.  Patient will continue to adhere to daily exercise and lifestyle modifications while inperson CR is suspended. Patient will continue to engage with CR staff via telephone encounters.  --        Exercise Goals and Review: Exercise Goals    Row Name 05/26/19 1124             Exercise Goals   Increase Physical Activity  Yes       Intervention  Provide advice, education, support and counseling about physical activity/exercise needs.;Develop an individualized exercise prescription for aerobic and resistive training  based on initial evaluation findings, risk stratification, comorbidities and participant's personal goals.       Expected Outcomes  Short Term: Attend rehab on a regular basis to increase amount of physical activity.;Long Term: Add in home exercise to make exercise part of routine and to increase amount of physical activity.;Long Term: Exercising regularly at least 3-5 days a week.       Increase Strength and Stamina  Yes       Intervention  Provide advice, education, support and counseling about physical activity/exercise needs.;Develop an individualized exercise prescription for aerobic and resistive training based on initial evaluation findings, risk stratification, comorbidities and participant's personal goals.       Expected Outcomes  Short Term: Increase workloads from initial exercise prescription for resistance, speed, and METs.;Short Term: Perform resistance training exercises routinely during rehab and add in resistance training at home;Long Term: Improve cardiorespiratory fitness, muscular endurance and strength as measured by increased METs and functional capacity (6MWT)       Able to understand and use rate of perceived exertion (RPE) scale  Yes       Intervention  Provide education and explanation on how to use RPE scale       Expected Outcomes  Short Term: Able to use RPE daily in rehab to express subjective intensity level;Long Term:  Able to use RPE to guide intensity level when exercising independently       Knowledge and understanding of Target  Heart Rate Range (THRR)  Yes       Intervention  Provide education and explanation of THRR including how the numbers were predicted and where they are located for reference       Expected Outcomes  Short Term: Able to state/look up THRR;Long Term: Able to use THRR to govern intensity when exercising independently;Short Term: Able to use daily as guideline for intensity in rehab       Able to check pulse independently  Yes       Intervention   Provide education and demonstration on how to check pulse in carotid and radial arteries.;Review the importance of being able to check your own pulse for safety during independent exercise       Expected Outcomes  Short Term: Able to explain why pulse checking is important during independent exercise;Long Term: Able to check pulse independently and accurately       Understanding of Exercise Prescription  Yes       Intervention  Provide education, explanation, and written materials on patient's individual exercise prescription       Expected Outcomes  Short Term: Able to explain program exercise prescription;Long Term: Able to explain home exercise prescription to exercise independently          Exercise Goals Re-Evaluation: Exercise Goals Re-Evaluation    Row Name 06/03/19 1159 06/15/19 1144 06/24/19 1149         Exercise Goal Re-Evaluation   Exercise Goals Review  Able to understand and use rate of perceived exertion (RPE) scale;Increase Physical Activity  Able to understand and use rate of perceived exertion (RPE) scale;Increase Physical Activity;Understanding of Exercise Prescription;Knowledge and understanding of Target Heart Rate Range (THRR);Increase Strength and Stamina  Able to understand and use rate of perceived exertion (RPE) scale;Increase Physical Activity;Understanding of Exercise Prescription;Knowledge and understanding of Target Heart Rate Range (THRR);Increase Strength and Stamina     Comments  Patient able to understand and use RPE scale appropriately.  Reviewed home exercise guidelines with patient including endpoints, temperature precautions, target heart rate and rate of perceived exertion. Pt is walking ~45 minutes daily as her mode of home exercise and has 3 lb & 5 lb weights for her resistance training. Reviewed pulse counting and handout given. Pt voices understanding of instructions given.  Patient is walking 45 minutes at least 4 days/week as her mode of exericse. Pt also  has access to a fitness room where she lives and plans to use the equipment there including the treadmill and the elliptical machine.     Expected Outcomes  Progress workloads as tolerated to help increase energy level.  Patient will continue daily walking routine to help build strength and stamina.  Pt will walk and/or use the aerobic equipment at the residential fitness center where she lives. Patient will exercise 45 mintues at least 4 days/week to help improve cardiorespiratory fitness.        Nutrition & Weight - Outcomes: Pre Biometrics - 05/26/19 1124      Pre Biometrics   Height  5' 4.75" (1.645 m)    Weight  62.1 kg    Waist Circumference  34.5 inches    Hip Circumference  38.5 inches    Waist to Hip Ratio  0.9 %    BMI (Calculated)  22.95    Triceps Skinfold  21 mm    % Body Fat  35.4 %    Grip Strength  23 kg    Flexibility  13.5 in  Single Leg Stand  2.18 seconds      Post Biometrics - 06/24/19 1102       Post  Biometrics   Weight  61 kg    BMI (Calculated)  22.54       Nutrition: Nutrition Therapy & Goals - 06/10/19 1437      Nutrition Therapy   Diet  Heart Healthy    Drug/Food Interactions  Statins/Certain Fruits      Personal Nutrition Goals   Nutrition Goal  Maintain heart healthy diet      Intervention Plan   Intervention  Prescribe, educate and counsel regarding individualized specific dietary modifications aiming towards targeted core components such as weight, hypertension, lipid management, diabetes, heart failure and other comorbidities.;Nutrition handout(s) given to patient.    Expected Outcomes  Short Term Goal: Understand basic principles of dietary content, such as calories, fat, sodium, cholesterol and nutrients.;Long Term Goal: Adherence to prescribed nutrition plan.       Nutrition Discharge: Nutrition Assessments - 06/10/19 1441      MEDFICTS Scores   Pre Score  12       Education Questionnaire Score: Knowledge Questionnaire Score  - 05/26/19 1128      Knowledge Questionnaire Score   Pre Score  20/24       Patient discharged after 7 sessions. Unable to evaluate patients progression towards goals secondary to limited number of exercise sessions completed.

## 2019-07-01 ENCOUNTER — Encounter (HOSPITAL_COMMUNITY): Payer: Medicare Other

## 2019-07-03 ENCOUNTER — Encounter (HOSPITAL_COMMUNITY): Payer: Medicare Other

## 2019-07-06 ENCOUNTER — Encounter (HOSPITAL_COMMUNITY): Payer: Medicare Other

## 2019-07-08 ENCOUNTER — Encounter (HOSPITAL_COMMUNITY): Payer: Medicare Other

## 2019-07-08 ENCOUNTER — Ambulatory Visit: Payer: Medicare Other | Attending: Internal Medicine

## 2019-07-08 DIAGNOSIS — Z23 Encounter for immunization: Secondary | ICD-10-CM | POA: Diagnosis not present

## 2019-07-08 NOTE — Progress Notes (Signed)
   Covid-19 Vaccination Clinic  Name:  Claudia Howell    MRN: [id number] DOB: 1940-08-18  07/08/2019  Claudia Howell was observed post Covid-19 immunization for 15 minutes without incidence. She was provided with Vaccine Information Sheet and instruction to access the V-Safe system.   Claudia Howell was instructed to call 911 with any severe reactions post vaccine: Marland Kitchen Difficulty breathing  . Swelling of your face and throat  . A fast heartbeat  . A bad rash all over your body  . Dizziness and weakness    Immunizations Administered    Name Date Dose VIS Date Route   Pfizer COVID-19 Vaccine 07/08/2019 12:08 PM 0.3 mL 06/05/2019 Intramuscular   Manufacturer: [place]   Lot: [id number]   [place]: [id number]

## 2019-07-10 ENCOUNTER — Encounter (HOSPITAL_COMMUNITY): Payer: Medicare Other

## 2019-07-13 ENCOUNTER — Encounter (HOSPITAL_COMMUNITY): Payer: Medicare Other

## 2019-07-15 ENCOUNTER — Encounter (HOSPITAL_COMMUNITY): Payer: Medicare Other

## 2019-07-17 ENCOUNTER — Encounter (HOSPITAL_COMMUNITY): Payer: Medicare Other

## 2019-07-20 ENCOUNTER — Encounter (HOSPITAL_COMMUNITY): Payer: Medicare Other

## 2019-07-21 ENCOUNTER — Other Ambulatory Visit: Payer: Self-pay | Admitting: Interventional Cardiology

## 2019-07-21 MED ORDER — NITROGLYCERIN 0.4 MG SL SUBL: 0.4000 mg | SUBLINGUAL_TABLET | SUBLINGUAL | 6 refills | Status: DC | PRN

## 2019-07-22 ENCOUNTER — Encounter (HOSPITAL_COMMUNITY): Payer: Medicare Other

## 2019-07-29 ENCOUNTER — Ambulatory Visit: Payer: Medicare Other | Attending: Internal Medicine

## 2019-07-29 DIAGNOSIS — Z23 Encounter for immunization: Secondary | ICD-10-CM | POA: Insufficient documentation

## 2019-07-29 NOTE — Progress Notes (Signed)
   Covid-19 Vaccination Clinic  Name:  Claudia Howell    MRN: [id number] DOB: [birth year]  07/29/2019  [name] was observed post Covid-19 immunization for 15 minutes without incidence. She was provided with Vaccine Information Sheet and instruction to access the V-Safe system.   [name] was instructed to call 911 with any severe reactions post vaccine: Marland Kitchen Difficulty breathing  . Swelling of your face and throat  . A fast heartbeat  . A bad rash all over your body  . Dizziness and weakness    Immunizations Administered    Name Date Dose VIS Date Route   Pfizer COVID-19 Vaccine 07/29/2019 11:56 AM 0.3 mL 06/05/2019 Intramuscular   Manufacturer: [place]   Lot: [id number]   [place]: [id number]

## 2019-08-05 DIAGNOSIS — H43821 Vitreomacular adhesion, right eye: Secondary | ICD-10-CM | POA: Diagnosis not present

## 2019-08-05 DIAGNOSIS — H35041 Retinal micro-aneurysms, unspecified, right eye: Secondary | ICD-10-CM | POA: Diagnosis not present

## 2019-08-05 DIAGNOSIS — M7130 Other bursal cyst, unspecified site: Secondary | ICD-10-CM | POA: Diagnosis not present

## 2019-08-05 DIAGNOSIS — H348112 Central retinal vein occlusion, right eye, stable: Secondary | ICD-10-CM | POA: Diagnosis not present

## 2019-08-05 DIAGNOSIS — H34231 Retinal artery branch occlusion, right eye: Secondary | ICD-10-CM | POA: Diagnosis not present

## 2019-08-12 DIAGNOSIS — H26491 Other secondary cataract, right eye: Secondary | ICD-10-CM | POA: Diagnosis not present

## 2019-09-03 ENCOUNTER — Other Ambulatory Visit: Payer: Self-pay | Admitting: *Deleted

## 2019-09-03 MED ORDER — BENAZEPRIL HCL 40 MG PO TABS: 40.0000 mg | ORAL_TABLET | Freq: Every day | ORAL | 3 refills | Status: DC

## 2019-09-23 ENCOUNTER — Other Ambulatory Visit: Payer: Self-pay

## 2019-09-23 ENCOUNTER — Telehealth: Payer: Self-pay | Admitting: *Deleted

## 2019-09-23 ENCOUNTER — Ambulatory Visit (INDEPENDENT_AMBULATORY_CARE_PROVIDER_SITE_OTHER): Payer: Medicare Other | Admitting: Family Medicine

## 2019-09-23 ENCOUNTER — Encounter: Payer: Self-pay | Admitting: Family Medicine

## 2019-09-23 VITALS — BP 150/70 | HR 68 | Temp 96.9°F | Ht 64.75 in | Wt 137.2 lb

## 2019-09-23 DIAGNOSIS — I25719 Atherosclerosis of autologous vein coronary artery bypass graft(s) with unspecified angina pectoris: Secondary | ICD-10-CM | POA: Diagnosis not present

## 2019-09-23 DIAGNOSIS — I10 Essential (primary) hypertension: Secondary | ICD-10-CM

## 2019-09-23 DIAGNOSIS — K5900 Constipation, unspecified: Secondary | ICD-10-CM

## 2019-09-23 NOTE — Progress Notes (Signed)
Chief Complaint  Patient presents with  . Constipation    has been having issues with constipation over the past few months, worsening over the last 3 weeks. When she does not have a BM for 3 days she has been using Fleet Enema but doesn't feel like it is working enough.    Patient presents with complaint of constipation.  She last had a BM this morning--no straining, but felt smaller than it should have, didn't feel like she completely emptied.  Today's bowel movement was spontaneous (no enema or Mg Citrate taken).  She uses Mag Citrate prn if no bowel movement in 3-4 days.  She worries about getting impacted, so uses something to help things along if no spontaneous bowel movement in 3-4 days. Constipation is gradually worsening over the last few months.  Has been taking senna nightly x 35 years. Takes metamucil BID.  Had 2 new meds since her MI in November. Plavix and Crestor. Takes HCTZ daily; hasn't needed any lasix.  Eating fewer processed foods, eating more fruits/vegetables. Drinks 4-5 glasses of water/day. 1 cup of caffeine daily, rare alcohol on the weekend.  She tried Miralax in the past, just prn and it wasn't very effective.  She is exercising 3x/week--bike, and stretching with weights, some walking.  She was scheduled for colonoscopy in November, had to cancel due to MI. Last colonoscopy was 10/2014--tubular adenomas, rec 3 year repeat.    Lab Results  Component Value Date   TSH 2.141 04/30/2019   PMH, PSH, SH reviewed  Outpatient Encounter Medications as of 09/23/2019  Medication Sig  . aspirin EC 81 MG tablet Take 1 tablet (81 mg total) by mouth daily.  Marland Kitchen atenolol (TENORMIN) 100 MG tablet Take 100 mg by mouth daily.  . benazepril (LOTENSIN) 40 MG tablet Take 1 tablet (40 mg total) by mouth daily.  . cholecalciferol (VITAMIN D) 1000 UNITS tablet Take 1,000 Units by mouth daily.  . clopidogrel (PLAVIX) 75 MG tablet Take 1 tablet (75 mg total) by mouth daily with  breakfast.  . Coenzyme Q10 (COQ10 PO) Take 1 capsule by mouth daily.  Marland Kitchen docusate sodium (COLACE) 100 MG capsule Take 400 mg by mouth daily.  Marland Kitchen GLUCOSAMINE-CHONDROITIN-VIT D3 PO Take 1 tablet by mouth 2 (two) times a week.   . hydrochlorothiazide (HYDRODIURIL) 25 MG tablet Take 25 mg by mouth daily.  . Multiple Vitamin (MULTIVITAMIN) tablet Take 1 tablet by mouth daily.  . potassium chloride (K-DUR) 10 MEQ tablet Take 10 mEq by mouth every 3 (three) days.   Marland Kitchen psyllium (METAMUCIL) 58.6 % packet Take 1 packet by mouth 2 (two) times daily.  . rosuvastatin (CRESTOR) 10 MG tablet Take 1 tablet (10 mg total) by mouth daily.  Marland Kitchen senna (SENOKOT) 8.6 MG tablet Take 5 tablets by mouth daily.  . furosemide (LASIX) 20 MG tablet Take 1 tablet (20 mg total) by mouth as needed for fluid or edema. Reported on 12/19/2015 (Patient not taking: Reported on 09/23/2019)  . nitroGLYCERIN (NITROSTAT) 0.4 MG SL tablet Place 1 tablet (0.4 mg total) under the tongue every 5 (five) minutes as needed for chest pain. (Patient not taking: Reported on 09/23/2019)   No facility-administered encounter medications on file as of 09/23/2019.   Allergies  Allergen Reactions  . Contrast Media [Iodinated Diagnostic Agents] Hives  . Zetia [Ezetimibe] Other (See Comments)    Muscle cramps  . Oxycodone Nausea And Vomiting and Other (See Comments)    Extreme nausea and vomiting  . Repatha [Evolocumab]  Other (See Comments)    Muscle cramps   ROS: no fever, chills, URI symptoms, nausea, vomiting, diarrhea.  +constipation per HPI.  No abdominal pain, urinary complaints.  No chest pain, cough, shortness of breath. No changes in energy, hair/skin/weight.   PHYSICAL EXAM:  BP (!) 150/70 (BP Location: Left Arm, Cuff Size: Normal)   Pulse 68   Temp (!) 96.9 F (36.1 C) (Other (Comment))   Ht 5' 4.75" (1.645 m)   Wt 137 lb 3.2 oz (62.2 kg)   BMI 23.01 kg/m   Wt Readings from Last 3 Encounters:  09/23/19 137 lb 3.2 oz (62.2 kg)   06/24/19 134 lb 7.7 oz (61 kg)  06/01/19 135 lb 6.4 oz (61.4 kg)   Pleasant, well-appearing female, in good spirits, in no distress HEENT: conjunctiva and sclera are clear, EOMI .Wearing mask Neck; No lymphadenopathy, thyromegaly or mass Heart: regular rate and rhythm Lungs: clear bilaterally Back: no spinal or CVA tenderness Abdomen: soft, nontender, normal bowel sounds.  Nondistended. Extremities: no edema. Psych: normal mood, affect, hygiene and grooming Neuro: alert and oriented, normal gait.  ASSESSMENT/PLAN:  Constipation, unspecified constipation type  Coronary artery disease involving autologous vein coronary bypass graft with angina pectoris [facility]) - Doing well since last MI in November.  Under care of cardiologist, no chest pain or DOE  Essential hypertension, benign - BP elevated in office; to monitor at home.   Please increase your water intake to at least 8 glasses daily. Continue your metamucil. Add Miralax--start at 1 capful daily, but titrate down if/when your bowel movements become too loose or too frequent.  Many people use 1/2 capful daily (or less often), vs a full capful 2-3 times/week to maintain a normal bowel regimen.  You are past due for colonoscopy, so please call Dr. Michail Sermon [facility]) to reschedule this.  Need to make sure there aren't any more pre-cancerous polyps developing, or anything worse that could be contributing to your worsening constipation.  Continue your high fiber diet and regular exercise.

## 2019-09-23 NOTE — Patient Instructions (Signed)
  Please increase your water intake to at least 8 glasses daily. Continue your metamucil. Add Miralax--start at 1 capful daily, but titrate down if/when your bowel movements become too loose or too frequent.  Many people use 1/2 capful daily (or less often), vs a full capful 2-3 times/week to maintain a normal bowel regimen.  You are past due for colonoscopy, so please call Dr. Michail Sermon [facility]) to reschedule this.  Need to make sure there aren't any more pre-cancerous polyps developing, or anything worse that could be contributing to your worsening constipation.  Continue your high fiber diet and regular exercise.   Constipation, Adult Constipation is when a person has fewer bowel movements in a week than normal, has difficulty having a bowel movement, or has stools that are dry, hard, or larger than normal. Constipation may be caused by an underlying condition. It may become worse with age if a person takes certain medicines and does not take in enough fluids. Follow these instructions at home: Eating and drinking   Eat foods that have a lot of fiber, such as fresh fruits and vegetables, whole grains, and beans.  Limit foods that are high in fat, low in fiber, or overly processed, such as french fries, hamburgers, cookies, candies, and soda.  Drink enough fluid to keep your urine clear or pale yellow. General instructions  Exercise regularly or as told by your health care provider.  Go to the restroom when you have the urge to go. Do not hold it in.  Take over-the-counter and prescription medicines only as told by your health care provider. These include any fiber supplements.  Practice pelvic floor retraining exercises, such as deep breathing while relaxing the lower abdomen and pelvic floor relaxation during bowel movements.  Watch your condition for any changes.  Keep all follow-up visits as told by your health care provider. This is important. Contact a health care provider  if:  You have pain that gets worse.  You have a fever.  You do not have a bowel movement after 4 days.  You vomit.  You are not hungry.  You lose weight.  You are bleeding from the anus.  You have thin, pencil-like stools. Get help right away if:  You have a fever and your symptoms suddenly get worse.  You leak stool or have blood in your stool.  Your abdomen is bloated.  You have severe pain in your abdomen.  You feel dizzy or you faint. This information is not intended to replace advice given to you by your health care provider. Make sure you discuss any questions you have with your health care provider. Document Revised: 05/24/2017 Document Reviewed: 11/30/2015 Elsevier Patient Education  2020 [employer].

## 2019-09-23 NOTE — Telephone Encounter (Signed)
Error

## 2019-10-05 ENCOUNTER — Other Ambulatory Visit: Payer: Self-pay

## 2019-10-05 MED ORDER — ATENOLOL 100 MG PO TABS: 100.0000 mg | ORAL_TABLET | Freq: Every day | ORAL | 2 refills | Status: DC

## 2019-10-27 ENCOUNTER — Telehealth: Payer: Self-pay

## 2019-10-27 DIAGNOSIS — Z86010 Personal history of colonic polyps: Secondary | ICD-10-CM | POA: Diagnosis not present

## 2019-10-27 NOTE — Telephone Encounter (Signed)
   [facility] Pre-operative Risk Assessment    Request for surgical clearance:  1. What type of surgery is being performed? History if colon polyps  2. When is this surgery scheduled? TBD   3. What type of clearance is required (medical clearance vs. Pharmacy clearance to hold med vs. Both)? Both   4. Are there any medications that need to be held prior to surgery and how long? Plavix 5 days   5. Practice name and name of physician performing surgery? [facility], Wilford Corner, MD   6. What is your office phone number [phone]    7.   What is your office fax number [phone]  8.   Anesthesia type (None, local, MAC, general) ? None    Mendel Ryder 10/27/2019, 5:13 PM  _________________________________________________________________   (provider comments below)

## 2019-11-02 NOTE — Telephone Encounter (Signed)
   Primary Cardiologist: Sinclair Grooms, MD  Chart reviewed as part of pre-operative protocol coverage.   We awaiting help from callback team to clarify type of procedure (agree this is likely a colonoscopy based on coming from GI team). [facility] staff, can you also clarify the urgency of procedure? Patient had stent in 04/2019 and was supposed to continue uninterrupted therapy for 12 months (through 04/2020) which may impact decision to proceed.  In the meantime I will route to Dr. Tamala Julian for input on holding Plavix since patient just had CTO PCI of the RCA in 04/2019 - at which time the recommendation was made to continue uninterrupted DAPT for 12 months. Dr. Tamala Julian - Please route response to P [facility] DIV PREOP (the pre-op pool). Thank you.  Kamika Goodloe PA-C

## 2019-11-02 NOTE — Telephone Encounter (Signed)
Attempted to reach requesting office to clarify what type of surgery patient would be having. No answer. Will try again later

## 2019-11-03 NOTE — Telephone Encounter (Signed)
Needs to be at least six months beyond PCI with RCA stent and would prefer 12 months if possible. If not urgent, suggest 04/2020.

## 2019-11-03 NOTE — Telephone Encounter (Signed)
S/w Dr. Kathline Magic office who confirms procedure is not of an urgent matter. I explained to GI office the pt has stent placed 04/2019. Per recommendations would prefer to not interrupt antiplatelet therapy for 1 yr which would be 04/2020 if possible. Per Dr. Kathline Magic office not urgent and to just send notes reflecting recommendations.

## 2019-11-03 NOTE — Telephone Encounter (Addendum)
Thank you. Await reply from Dr. Tamala Julian regarding holding antiplatelet therapy given PCI 05/2019 - Dr Tamala Julian, please see my original message below. - Please route response to P CV DIV PREOP (the pre-op pool). Thank you!

## 2019-11-03 NOTE — Telephone Encounter (Signed)
I s/w [facility] and confirmed the pt is having a colonoscopy. I will re-send to pre op team for further evaluation.

## 2019-11-04 NOTE — Telephone Encounter (Signed)
   Primary Cardiologist: Sinclair Grooms, MD  Chart reviewed as part of pre-operative protocol coverage. Patient was contacted 11/04/2019 in reference to pre-operative risk assessment for pending surgery as outlined below.  Due to her recent PCI with DES Dr Tamala Julian recomends Sondra Barges hold off on her procedure till Nov 2021 if possible.   Pre-op covering staff: - Please schedule appointment and call patient to inform them. - Please contact requesting surgeon's office via preferred method (i.e, phone, fax) to inform them of need for appointment prior to surgery.  Kerin Ransom, PA-C 11/04/2019, 9:18 AM

## 2019-11-09 ENCOUNTER — Other Ambulatory Visit: Payer: Self-pay

## 2019-11-09 MED ORDER — HYDROCHLOROTHIAZIDE 25 MG PO TABS: 25.0000 mg | ORAL_TABLET | Freq: Every day | ORAL | 1 refills | Status: DC

## 2019-11-10 ENCOUNTER — Telehealth: Payer: Self-pay | Admitting: Family Medicine

## 2019-11-10 NOTE — Telephone Encounter (Signed)
Pt called and needs a referral in order to get a hearing aid. She wants the referral sent to [facility]

## 2019-11-10 NOTE — Telephone Encounter (Signed)
Ok for referral?

## 2019-11-11 NOTE — Telephone Encounter (Signed)
Done-faxed to [facility] @ [phone]. Patient advised.

## 2019-11-16 ENCOUNTER — Ambulatory Visit (INDEPENDENT_AMBULATORY_CARE_PROVIDER_SITE_OTHER): Payer: Medicare Other | Admitting: Ophthalmology

## 2019-11-16 ENCOUNTER — Encounter (INDEPENDENT_AMBULATORY_CARE_PROVIDER_SITE_OTHER): Payer: Self-pay | Admitting: Ophthalmology

## 2019-11-16 ENCOUNTER — Other Ambulatory Visit: Payer: Self-pay

## 2019-11-16 DIAGNOSIS — H26491 Other secondary cataract, right eye: Secondary | ICD-10-CM

## 2019-11-16 DIAGNOSIS — H34231 Retinal artery branch occlusion, right eye: Secondary | ICD-10-CM | POA: Diagnosis not present

## 2019-11-16 DIAGNOSIS — H348112 Central retinal vein occlusion, right eye, stable: Secondary | ICD-10-CM

## 2019-11-16 DIAGNOSIS — H43813 Vitreous degeneration, bilateral: Secondary | ICD-10-CM | POA: Diagnosis not present

## 2019-11-16 DIAGNOSIS — H43821 Vitreomacular adhesion, right eye: Secondary | ICD-10-CM

## 2019-11-16 HISTORY — DX: Vitreomacular adhesion, right eye: H43.821

## 2019-11-16 HISTORY — DX: Other secondary cataract, right eye: H26.491

## 2019-11-16 NOTE — Progress Notes (Signed)
11/16/2019     CHIEF COMPLAINT Patient presents for Retina Follow Up   HISTORY OF PRESENT ILLNESS: Claudia Howell is a 79 y.o. female who presents to the clinic today for:   HPI    Retina Follow Up    Patient presents with  CRVO/BRVO.  In both eyes.  Severity is moderate.  Duration of 3 months.  Since onset it is stable.  I, the attending physician,  performed the HPI with the patient and updated documentation appropriately.          Comments    3 Month f\u OU. OCT  Pt states vision is stable. Denies FOL and floaters.       Last edited by Tilda Franco on 11/16/2019  1:04 PM. (History)      Referring physician: Rita Ohara, [place],  [place] [address]  HISTORICAL INFORMATION:   Selected notes from the MEDICAL RECORD NUMBER    Lab Results  Component Value Date   HGBA1C 5.5 08/10/2013     CURRENT MEDICATIONS: No current outpatient medications on file. (Ophthalmic Drugs)   No current facility-administered medications for this visit. (Ophthalmic Drugs)   Current Outpatient Medications (Other)  Medication Sig  . aspirin EC 81 MG tablet Take 1 tablet (81 mg total) by mouth daily.  Marland Kitchen atenolol (TENORMIN) 100 MG tablet Take 1 tablet (100 mg total) by mouth daily.  . benazepril (LOTENSIN) 40 MG tablet Take 1 tablet (40 mg total) by mouth daily.  . cholecalciferol (VITAMIN D) 1000 UNITS tablet Take 1,000 Units by mouth daily.  . clopidogrel (PLAVIX) 75 MG tablet Take 1 tablet (75 mg total) by mouth daily with breakfast.  . Coenzyme Q10 (COQ10 PO) Take 1 capsule by mouth daily.  Marland Kitchen docusate sodium (COLACE) 100 MG capsule Take 400 mg by mouth daily.  . furosemide (LASIX) 20 MG tablet Take 1 tablet (20 mg total) by mouth as needed for fluid or edema. Reported on 12/19/2015 (Patient not taking: Reported on 09/23/2019)  . GLUCOSAMINE-CHONDROITIN-VIT D3 PO Take 1 tablet by mouth 2 (two) times a week.   . hydrochlorothiazide (HYDRODIURIL) 25 MG tablet Take  1 tablet (25 mg total) by mouth daily.  . Multiple Vitamin (MULTIVITAMIN) tablet Take 1 tablet by mouth daily.  . nitroGLYCERIN (NITROSTAT) 0.4 MG SL tablet Place 1 tablet (0.4 mg total) under the tongue every 5 (five) minutes as needed for chest pain. (Patient not taking: Reported on 09/23/2019)  . potassium chloride (K-DUR) 10 MEQ tablet Take 10 mEq by mouth every 3 (three) days.   Marland Kitchen psyllium (METAMUCIL) 58.6 % packet Take 1 packet by mouth 2 (two) times daily.  . rosuvastatin (CRESTOR) 10 MG tablet Take 1 tablet (10 mg total) by mouth daily.  Marland Kitchen senna (SENOKOT) 8.6 MG tablet Take 5 tablets by mouth daily.   No current facility-administered medications for this visit. (Other)      REVIEW OF SYSTEMS:    ALLERGIES Allergies  Allergen Reactions  . Contrast Media [Iodinated Diagnostic Agents] Hives  . Zetia [Ezetimibe] Other (See Comments)    Muscle cramps  . Oxycodone Nausea And Vomiting and Other (See Comments)    Extreme nausea and vomiting  . Repatha [Evolocumab] Other (See Comments)    Muscle cramps    PAST MEDICAL HISTORY Past Medical History:  Diagnosis Date  . Adenomatous colon polyp 10/09  . CHF (congestive heart failure) ([place])   . Coronary artery disease    Last cath 12/14 Cath  12/8 100% SVG occlusion to RCA, 95% SVG.  The stenosis to OM.  LIMA to LAD patent but diffuse disease in LAD after graft.  Overlapping stents mid/distal SVG to OM.  Marland Kitchen Decubitus ulcer of coccyx 10/22/12   1/2 inch raw open area on coccyx  . Hyperlipidemia   . Hypertension    Dr.Henry Tamala Julian  . Osteopenia 9/09  . PAF (paroxysmal atrial fibrillation) ([place])    during cath/notes 10/16/2012  . S/P CABG x 3 10/22/2012   LIMA to LAD, SVG to distal RCA, SVG to OM, EVH from right thigh  . S/P Maze operation for atrial fibrillation 10/22/2012   Left side lesion set using bipolar radiofrequency and cryothermy ablation with clipping of LA appendage   Past Surgical History:  Procedure Laterality Date  .  BREAST CYST EXCISION Left 1980-1990's   x 3  . CARDIAC CATHETERIZATION  10/16/2012  . CATARACT EXTRACTION, BILATERAL Bilateral    03/2015 and 05/2015  . CATARACT EXTRACTION, BILATERAL Bilateral approx 2016  . CORONARY ANGIOPLASTY WITH STENT PLACEMENT  04/01/2013   "1" (06/01/2013)  . CORONARY ARTERY BYPASS GRAFT N/A 10/22/2012   Procedure: CORONARY ARTERY BYPASS GRAFTING (CABG);  Surgeon: Rexene Alberts, MD;  Location: [facility];  Service: Open Heart Surgery;  Laterality: N/A;  . CORONARY ATHERECTOMY N/A 04/29/2019   Procedure: CORONARY ATHERECTOMY;  Surgeon: Martinique, Peter M, MD;  Location: [facility] CV [facility];  Service: Cardiovascular;  Laterality: N/A;  . CORONARY CTO INTERVENTION N/A 04/29/2019   Procedure: CORONARY CTO INTERVENTION;  Surgeon: Martinique, Peter M, MD;  Location: [facility];  Service: Cardiovascular;  Laterality: N/A;  . CORONARY STENT INTERVENTION N/A 01/01/2019   Procedure: CORONARY STENT INTERVENTION;  Surgeon: Belva Crome, MD;  Location: [facility];  Service: Cardiovascular;  Laterality: N/A;  . DILATION AND CURETTAGE OF UTERUS    . INTRAOPERATIVE TRANSESOPHAGEAL ECHOCARDIOGRAM N/A 10/22/2012   Procedure: INTRAOPERATIVE TRANSESOPHAGEAL ECHOCARDIOGRAM;  Surgeon: Rexene Alberts, MD;  Location: [facility];  Service: Open Heart Surgery;  Laterality: N/A;  . LEFT HEART CATH AND CORS/GRAFTS ANGIOGRAPHY N/A 01/01/2019   Procedure: LEFT HEART CATH AND CORS/GRAFTS ANGIOGRAPHY;  Surgeon: Belva Crome, MD;  Location: [facility];  Service: Cardiovascular;  Laterality: N/A;  . LEFT HEART CATH AND CORS/GRAFTS ANGIOGRAPHY N/A 04/28/2019   Procedure: LEFT HEART CATH AND CORS/GRAFTS ANGIOGRAPHY;  Surgeon: Belva Crome, MD;  Location: [facility];  Service: Cardiovascular;  Laterality: N/A;  . LEFT HEART CATHETERIZATION WITH CORONARY ANGIOGRAM N/A 10/16/2012   Procedure: LEFT HEART CATHETERIZATION WITH CORONARY ANGIOGRAM;  Surgeon: Sinclair Grooms, MD;  Location[facility] CATH  LAB;  Service: Cardiovascular;  Laterality: N/A;  . LEFT HEART CATHETERIZATION WITH CORONARY/GRAFT ANGIOGRAM N/A 06/01/2013   Procedure: LEFT HEART CATHETERIZATION WITH Beatrix Fetters;  Surgeon: Sinclair Grooms, MD;  Location: [facility] CATH LAB;  Service: Cardiovascular;  Laterality: N/A;  . MAZE N/A 10/22/2012   Procedure: MAZE;  Surgeon: Rexene Alberts, MD;  Location: [facility];  Service: Open Heart Surgery;  Laterality: N/A;  . PERCUTANEOUS CORONARY STENT INTERVENTION (PCI-S)  06/01/2013   Procedure: PERCUTANEOUS CORONARY STENT INTERVENTION (PCI-S);  Surgeon: Sinclair Grooms, MD;  Location: [facility] CATH LAB;  Service: Cardiovascular;;  . TONSILLECTOMY  1949  . TOTAL ABDOMINAL HYSTERECTOMY W/ BILATERAL SALPINGOOPHORECTOMY  1990   benign growth    FAMILY HISTORY Family History  Problem Relation Age of Onset  . Hypertension Mother   . Heart attack  Mother 36  . Hypertension Brother   . Cancer Brother        prostate  . Heart disease Brother 46       stent  . Heart disease Other        x2 (age 104 and 69)  . Diabetes Neg Hx   . Breast cancer Neg Hx   . Colon cancer Neg Hx     SOCIAL HISTORY Social History   Tobacco Use  . Smoking status: Never Smoker  . Smokeless tobacco: Never Used  Substance Use Topics  . Alcohol use: Yes    Alcohol/week: 1.0 standard drinks    Types: 1 Glasses of wine per week    Comment: 1 glass of wine 1 times/week  . Drug use: No         OPHTHALMIC EXAM:  Base Eye Exam    Visual Acuity (Snellen - Linear)      Right Left   Dist cc 20/20 20/25   Correction: Glasses       Tonometry (Tonopen, 1:08 PM)      Right Left   Pressure 12 12       Pupils      Pupils Dark Light Shape React APD   Right PERRL 3 2.5 Round Brisk None   Left PERRL 3 2.5 Round Brisk None       Visual Fields (Counting fingers)      Left Right    Full    Restrictions  Partial outer inferior nasal deficiency       Neuro/Psych    Oriented x3: Yes   Mood/Affect:  Normal       Dilation    Both eyes: 1.0% Mydriacyl, 2.5% Phenylephrine @ 1:08 PM        Slit Lamp and Fundus Exam    External Exam      Right Left   External Normal Normal       Slit Lamp Exam      Right Left   Lids/Lashes Normal Normal   Conjunctiva/Sclera White and quiet White and quiet   Cornea Clear Clear   Anterior Chamber Deep and quiet Deep and quiet   Iris Round and reactive Round and reactive   Lens Posterior chamber intraocular lens Posterior chamber intraocular lens   Anterior Vitreous Normal Normal       Fundus Exam      Right Left   Posterior Vitreous Posterior vitreous detachment Posterior vitreous detachment   Disc Normal Normal   C/D Ratio 0.25 0.25   Macula Normal Normal   Vessels Old retinal vein occlusion in the right eye, compensated no active disease Normal   Periphery Normal Normal          IMAGING AND PROCEDURES  Imaging and Procedures for 11/16/19  OCT, Retina - OU - Both Eyes       Right Eye Quality was good. Scan locations included subfoveal. Central Foveal Thickness: 273. Progression has been stable.   Left Eye Quality was good. Scan locations included subfoveal. Central Foveal Thickness: 268. Progression has been stable. Findings include normal observations, retinal drusen .   Notes Region inferotemporal of atrophy, no active CME, OD.  PVD OD, OS                ASSESSMENT/PLAN:  Central retinal vein occlusion of right eye Old CRV O, OD, stable, no active disease      ICD-10-CM   1. Posterior vitreous detachment of both eyes  H43.813  2. Stable central retinal vein occlusion of right eye  H34.8112 OCT, Retina - OU - Both Eyes  3. Right posterior capsular opacification  H26.491   4. Branch retinal artery occlusion of right eye  H34.231     1.  OU stable, posterior vitreous detachment minor.  2.  Observe CRV O old.  3.  Ophthalmic Meds Ordered this visit:  No orders of the defined types were placed in this  encounter.      Return in about 9 months (around 08/18/2020) for DILATE OU, COLOR FP, OCT.  There are no Patient Instructions on file for this visit.   Explained the diagnoses, plan, and follow up with the patient and they expressed understanding.  Patient expressed understanding of the importance of proper follow up care.   Clent Demark Jaxsyn Azam M.D. Diseases & Surgery of the Retina and Vitreous [facility] [place] 11/16/19     Abbreviations: M myopia (nearsighted); A astigmatism; H hyperopia (farsighted); P presbyopia; Mrx spectacle prescription;  CTL contact lenses; OD right eye; OS left eye; OU both eyes  XT exotropia; ET esotropia; PEK punctate epithelial keratitis; PEE punctate epithelial erosions; DES dry eye syndrome; MGD meibomian gland dysfunction; ATs artificial tears; PFAT's preservative free artificial tears; [place] nuclear sclerotic cataract; PSC posterior subcapsular cataract; ERM epi-retinal membrane; PVD posterior vitreous detachment; RD retinal detachment; DM diabetes mellitus; DR diabetic retinopathy; NPDR non-proliferative diabetic retinopathy; PDR proliferative diabetic retinopathy; CSME clinically significant macular edema; DME diabetic macular edema; dbh dot blot hemorrhages; CWS cotton wool spot; POAG primary open angle glaucoma; C/D cup-to-disc ratio; HVF humphrey visual field; GVF goldmann visual field; OCT optical coherence tomography; IOP intraocular pressure; BRVO Branch retinal vein occlusion; CRVO central retinal vein occlusion; CRAO central retinal artery occlusion; BRAO branch retinal artery occlusion; RT retinal tear; SB scleral buckle; PPV pars plana vitrectomy; VH Vitreous hemorrhage; PRP panretinal laser photocoagulation; IVK intravitreal kenalog; VMT vitreomacular traction; MH Macular hole;  NVD neovascularization of the disc; NVE neovascularization elsewhere; AREDS age related eye disease study; ARMD age related macular degeneration; POAG primary open angle  glaucoma; EBMD epithelial/anterior basement membrane dystrophy; ACIOL anterior chamber intraocular lens; IOL intraocular lens; PCIOL posterior chamber intraocular lens; Phaco/IOL phacoemulsification with intraocular lens placement; [place] photorefractive keratectomy; LASIK laser assisted in situ keratomileusis; HTN hypertension; DM diabetes mellitus; COPD chronic obstructive pulmonary disease

## 2019-11-16 NOTE — Assessment & Plan Note (Signed)
Old CRV O, OD, stable, no active disease

## 2019-12-03 DIAGNOSIS — H903 Sensorineural hearing loss, bilateral: Secondary | ICD-10-CM | POA: Diagnosis not present

## 2019-12-22 ENCOUNTER — Encounter: Payer: Self-pay | Admitting: Family Medicine

## 2020-02-09 ENCOUNTER — Other Ambulatory Visit: Payer: Self-pay

## 2020-02-09 ENCOUNTER — Other Ambulatory Visit: Payer: Medicare Other

## 2020-02-09 DIAGNOSIS — Z20822 Contact with and (suspected) exposure to covid-19: Secondary | ICD-10-CM

## 2020-02-10 LAB — SARS-COV-2, NAA 2 DAY TAT

## 2020-02-10 LAB — NOVEL CORONAVIRUS, NAA: SARS-CoV-2, NAA: NOT DETECTED

## 2020-02-11 ENCOUNTER — Ambulatory Visit: Payer: Medicare Other | Admitting: Family Medicine

## 2020-03-22 ENCOUNTER — Other Ambulatory Visit: Payer: Self-pay

## 2020-03-22 ENCOUNTER — Other Ambulatory Visit (INDEPENDENT_AMBULATORY_CARE_PROVIDER_SITE_OTHER): Payer: Medicare Other

## 2020-03-22 DIAGNOSIS — Z23 Encounter for immunization: Secondary | ICD-10-CM

## 2020-04-06 DIAGNOSIS — Z23 Encounter for immunization: Secondary | ICD-10-CM | POA: Diagnosis not present

## 2020-04-14 ENCOUNTER — Other Ambulatory Visit: Payer: Self-pay | Admitting: Medical

## 2020-05-08 NOTE — Patient Instructions (Addendum)
HEALTH MAINTENANCE RECOMMENDATIONS:  It is recommended that you get at least 30 minutes of aerobic exercise at least 5 days/week (for weight loss, you may need as much as 60-90 minutes). This can be any activity that gets your heart rate up. This can be divided in 10-15 minute intervals if needed, but try and build up your endurance at least once a week.  Weight bearing exercise is also recommended twice weekly.  Eat a healthy diet with lots of vegetables, fruits and fiber.  "Colorful" foods have a lot of vitamins (ie green vegetables, tomatoes, red peppers, etc).  Limit sweet tea, regular sodas and alcoholic beverages, all of which has a lot of calories and sugar.  Up to 1 alcoholic drink daily may be beneficial for women (unless trying to lose weight, watch sugars).  Drink a lot of water.  Calcium recommendations are 1200-1500 mg daily (1500 mg for postmenopausal women or women without ovaries), and vitamin D 1000 IU daily.  This should be obtained from diet and/or supplements (vitamins), and calcium should not be taken all at once, but in divided doses.  Monthly self breast exams and yearly mammograms for women over the age of 41 is recommended.  Sunscreen of at least SPF 30 should be used on all sun-exposed parts of the skin when outside between the hours of 10 am and 4 pm (not just when at beach or pool, but even with exercise, golf, tennis, and yard work!)  Use a sunscreen that says "broad spectrum" so it covers both UVA and UVB rays, and make sure to reapply every 1-2 hours.  Remember to change the batteries in your smoke detectors when changing your clock times in the spring and fall. Carbon monoxide detectors are recommended for your home.  Use your seat belt every time you are in a car, and please drive safely and not be distracted with cell phones and texting while driving.   Claudia Howell , Thank you for taking time to come for your Medicare Wellness Visit. I appreciate your ongoing  commitment to your health goals. Please review the following plan we discussed and let me know if I can assist you in the future.   This is a list of the screening recommended for you and due dates:  Health Maintenance  Topic Date Due  .  Hepatitis C: One time screening is recommended by Center for Disease Control  (CDC) for  adults born from [birth year] through 1963.   Never done  . Colon Cancer Screening  10/28/2017  . Tetanus Vaccine  01/24/2019  . Flu Shot  Completed  . DEXA scan (bone density measurement)  Completed  . COVID-19 Vaccine  Completed  . Pneumonia vaccines  Completed   We will be checking hepatitis C screening with your bloodwork (they recently changed the recommendations to screen all adults, not just those born between 35-65).  You are due for a tetanus booster.  This is covered by Medicare Part D, and therefore you need to get TdaP from the pharmacy (we cannot give it to you in the office) You should wait 2 weeks between any vaccines.  You were given a pneumovax booster today, so wait 2 weeks before getting your tetanus shot.   You are past due for screening mammogram.  These are recommended every 1-2 years. Your last one was in 10/2017. We are ordering a bone density test to be done as well. [facility], schedule both tests.  According to  notes from Dr. Tamala Julian, he had recommended you delay your colonoscopy until November (now), when it had been a year from your stent--he was going to make medication changes at that time, and felt that you would then be safe for colonoscopy. You should double check with his office regarding your medications (aspirin and plavix), and then contact Dr. Kathline Magic office to reschedule your colonoscopy if Dr. Tamala Julian feels that it is now okay.  Continue to monitor your blood pressure, record the values and bring to your visit with Dr. Tamala Julian.  If they are really consistently over 140/90, you may want to let him know sooner, rather  than waiting until January.  Be sure to follow a low sodium diet and continue regular exercise.  Your ankle swelling appears to be very focal (not compatible with a sprain, or really even arthritis).  Try wearing an ankle compression sleeve/brace when you are walking (or exercise). If pain isn't improving, I recommend that you see a specialist (I can refer you to [facility])--send me a message to let me know.

## 2020-05-08 NOTE — Progress Notes (Signed)
Chief Complaint  Patient presents with  . Medicare Wellness    fasting AWV with pelvic. Right ankle swelling and pain x 4 months. Has not had Tdap at pharmacy.     Claudia Howell is a 79 y.o. female who presents for annual wellness visit and follow-up on chronic medical conditions.  She has the following concerns:  She is complaining of R ankle swelling and pain x 4 months.  No recall of any trauma or injury.  Pain/swelling is constant.  She has tried some topical arthritis creams (never voltaren gel), and epsom salts, didn't help much. Hurts to walk a lot on it.  Hasn't tried compression.  No swelling on the left.  Constipation:  This was last discussed in 08/2019. At that time we discussed increasing water, continuing metamucil, high fiber diet, and adding Miralax.  She is using a full capful of Miralax daily, and is doing much better. She is still taking Senna every night. She is eating more vegetables. She is moving her bowels every morning. No longer needs any Mag Citrate.  H/o adenomatous colon polyps: She was scheduled for colonoscopy, had to cancel due to MI. It continues to be postponed, due to being on Plavix and aspirin, per cardiologist's recommendation.  Should get clearance soon, now that it has been a year. Last colonoscopy was 10/2014--tubular adenomas, rec 3 year repeat.  CAD:  Patient is under the care of Dr. Tamala Julian, though she hasn't seen him since 05/2019. She has appointment scheduled for 06/2020. She has history of CAD with bypass graft(January/2014), saphenous vein graft stent(10/2012), and unstable angina treated 04/28/2019 with R coronary artery CTO PCI.  She also has hypertension, h/o acute on chronic systolic and diastolic heart failure, history of paroxysmal atrial fibrillation, s/pMaze procedure. Denies any recurrent atrial fibrillation.  Hypertension:  Blood pressures have been running 136-138/70 at home (range of 104/56 up to 152/88) per her recall. She is compliant  with atenolol, benazepril and HCTZ (and K+--reports she only takes this every 3-4 days, not daily). She is complaining of leg cramps. Taking 1/2 glass of tonic every night helps. It wakes her up at night about once a week. Last time she took K+ she took was 4 days ago.  Hyperlipidemia:  intolerance to zetia and limited tolerance to statins. She is currently on rosuvastatin 85m. She is taking Coenzyme Q10 daily.  She does have some muscle cramps (see above).  Goal LDL is <70, last was above this. Shetried Repatha x 3 doses through the lipid clinic, but didn't tolerate due to cold symptoms She is due for recheck. Will forward the results to Dr. STamala Julian I expect they will still be above goal. Lab Results  Component Value Date   CHOL 168 04/25/2019   HDL 57 04/25/2019   LDLCALC 90 04/25/2019   TRIG 106 04/25/2019   CHOLHDL 2.9 04/25/2019    Immunization History  Administered Date(s) Administered  . Fluad Quad(high Dose 65+) 03/25/2019, 03/22/2020  . Influenza Split 05/07/2012  . Influenza, High Dose Seasonal PF 04/22/2013, 03/25/2014, 04/08/2015, 04/13/2016, 03/27/2017, 05/08/2018  . PFIZER SARS-COV-2 Vaccination 07/08/2019, 07/29/2019, 04/06/2020  . Pneumococcal Conjugate-13 09/09/2014  . Pneumococcal Polysaccharide-23 06/30/2009  . Td 06/25/2002  . Tdap 01/23/2009  . Zoster 01/24/2008  . Zoster Recombinat (Shingrix) 03/14/2017, 05/24/2017   Last Pap smear: n/a (hysterectomy for benign reasons) Last [facility]: 10[facility]) Last colonoscopy:10/2014--multiple tubular adenomas; repeat was due 10/2017 (381yr delayed due to cardiac meds (plavix/ASA) Last DEXA: 09/2011 (T-1.1 L fem neck)--[facility];  I had recommended 5 yr f/u DEXA, but pt had previously declined. She is now interested in rechecking DEXA.  Exercise:Tai chi class 6x/week. Not currently doing weight-bearing exercise. Dentist: twice yearly Ophtho:sees retinal specialist every 6 months.  Other doctors caring for patient  include: Cardiology: Dr. Tamala Julian Ophtho: Dr. Peter Garter Retinal specialist: Dr. Zadie Rhine Dentist: Dr. Raelyn Ensign GI: Dr. Michail Sermon Derm: Dr. Jarome Matin  Depression screen:negative Fall screen: negative Functional status screen:got hearing aids this year; issues with short-term memory (trouble with word recall, names--plays card games with friends to help); pain in right ankle limits exercise some. Mini-Cog screen normal See full questionnaires in Epic  End of Life Discussion: Patient hasa living will and medical power of attorney   PMH, PSH, SH and FH were reviewed and updated  Outpatient Encounter Medications as of 05/09/2020  Medication Sig  . aspirin EC 81 MG tablet Take 1 tablet (81 mg total) by mouth daily.  Marland Kitchen atenolol (TENORMIN) 100 MG tablet Take 1 tablet (100 mg total) by mouth daily.  . benazepril (LOTENSIN) 40 MG tablet Take 1 tablet (40 mg total) by mouth daily.  . cholecalciferol (VITAMIN D) 1000 UNITS tablet Take 1,000 Units by mouth daily.  . clopidogrel (PLAVIX) 75 MG tablet TAKE 1 TABLET(75 MG) BY MOUTH DAILY WITH BREAKFAST  . Coenzyme Q10 (COQ10 PO) Take 1 capsule by mouth daily.  Marland Kitchen docusate sodium (COLACE) 100 MG capsule Take 400 mg by mouth daily.  Marland Kitchen GLUCOSAMINE-CHONDROITIN-VIT D3 PO Take 1 tablet by mouth 2 (two) times a week.   . hydrochlorothiazide (HYDRODIURIL) 25 MG tablet Take 1 tablet (25 mg total) by mouth daily.  . Multiple Vitamin (MULTIVITAMIN) tablet Take 1 tablet by mouth daily.  . potassium chloride (K-DUR) 10 MEQ tablet Take 10 mEq by mouth every 3 (three) days.   Marland Kitchen psyllium (METAMUCIL) 58.6 % packet Take 1 packet by mouth 2 (two) times daily.  . rosuvastatin (CRESTOR) 10 MG tablet Take 1 tablet (10 mg total) by mouth daily.  Marland Kitchen senna (SENOKOT) 8.6 MG tablet Take 5 tablets by mouth daily.  . furosemide (LASIX) 20 MG tablet Take 1 tablet (20 mg total) by mouth as needed for fluid or edema. Reported on 12/19/2015 (Patient not taking: Reported on 09/23/2019)   . nitroGLYCERIN (NITROSTAT) 0.4 MG SL tablet Place 1 tablet (0.4 mg total) under the tongue every 5 (five) minutes as needed for chest pain. (Patient not taking: Reported on 09/23/2019)   No facility-administered encounter medications on file as of 05/09/2020.   Allergies  Allergen Reactions  . Contrast Media [Iodinated Diagnostic Agents] Hives  . Zetia [Ezetimibe] Other (See Comments)    Muscle cramps  . Oxycodone Nausea And Vomiting and Other (See Comments)    Extreme nausea and vomiting  . Repatha [Evolocumab] Other (See Comments)    Muscle cramps    ROS: The patient denies anorexia, fever, headaches, ear pain, sore throat, breast concerns, palpitations, dizziness, syncope, dyspnea on exertion, cough, swelling, nausea, vomiting, diarrhea, constipation, abdominal pain, melena, hematochezia, indigestion/heartburn, hematuria, incontinence, dysuria, vaginal bleeding, discharge, odor or itch, genital lesions, joint pains, weakness, tremor, suspicious skin lesions, depression, anxiety, abnormal bleeding/bruising, or enlarged lymph nodes. No longer having insomnia. Constipation is improved, controlled with Miralax daily. Denies heartburn, indigestion. R ankle pain and swelling per HPI. Leg cramps at night.   PHYSICAL EXAM:  BP (!) 160/90   Pulse 72   Ht '5\' 5"'  (1.651 m)   Wt 136 lb (61.7 kg)   BMI 22.63 kg/m  172/84 on repeat by MD  Wt Readings from Last 3 Encounters:  05/09/20 136 lb (61.7 kg)  09/23/19 137 lb 3.2 oz (62.2 kg)  06/24/19 134 lb 7.7 oz (61 kg)    General Appearance:  Alert, cooperative, no distress, appears stated age   Head:  Normocephalic, without obvious abnormality, atraumatic   Eyes:  PERRL, conjunctiva/corneas clear, EOM's intact, fundi benign   Ears:  Normal TM's and external ear canals   Nose:  Not examined, wearing mask due to COVID-19 pandemic  Throat:  Not examined, wearing mask due to COVID-19 pandemic  Neck:  Supple, no  lymphadenopathy; thyroid: no enlargement/tenderness/ nodules;no carotid bruit appreciable  Back:  Spine nontender, no curvature, ROM normal, no CVA tenderness   Lungs:  Clear to auscultation bilaterally without wheezes, rales or ronchi; respirations unlabored   Chest Wall:  No tenderness or deformity. WHSS  Heart:  Regular rate and rhythm, S1 and S2 normal, no murmur, rub or gallop   Breast Exam:  No tenderness, masses, or nipple discharge or inversion. No axillary lymphadenopathy. WHSS left breastat superior aspect of areola. There is a palpable defect at 12 o'clock where tissue was removed.  No masses.  Abdomen:  Soft, non-tender, nondistended, normoactive bowel sounds,no masses, no hepatosplenomegaly. Small umbilical hernia, nontender, easily reducible.  Genitalia:  Normal external genitalia without lesions; +atrophic changes. BUS and vagina normal; normal bimanual exam without masses. uterus is surgically absent.  Rectal:  Normal tone, no masses or tenderness; heme negative brown stool  Extremities:  No clubbing, cyanosis or edema.  There is a 4-4.5 cm soft tissue mass/focal area of swelling at R lateral malleolus, spreading anteriorly.  There is no erythema, warmth. It is not tender.  No pain over ATFL or with ROM of ankle.  Pulses:  2+ and symmetric all extremities   Skin:  Skin color, texture, turgor normal, no rashes or lesions. Some bruising is noted  Lymph nodes:  Cervical, supraclavicular, and axillary nodes normal   Neurologic:  Normal strength, sensation and gait; reflexes 2+ and symmetric throughout   Psych: Normal mood, affect, hygiene and grooming   ASSESSMENT/PLAN:  Medicare annual wellness visit, subsequent  Coronary artery disease involving autologous vein coronary bypass graft with angina pectoris ([place]) - denies angina or DOE, doing well on current regimen.  Due to f/u with cardiologist (has been a year, likely no longer needs  dual antiplatelet tx)  Essential hypertension, benign - elevated today, borderline at home.  Cont current meds. To monitor more regularly at home and bring list to Dr. Tamala Julian.  Low Na diet, exercise - Plan: Comprehensive metabolic panel  Pure hypercholesterolemia - prev above goal. On Crestor 32m.  Question is if she could tolerate higher dose, didn't tolerate repatha or zetia - Plan: Lipid panel  Medication monitoring encounter - Plan: CBC with Differential/Platelet, Comprehensive metabolic panel, Lipid panel  S/P Maze operation for atrial fibrillation - no recurrence of afib  Chronic diastolic heart failure (HCC) - no evidence of fluid overload currently. Cont current medications  Need for pneumococcal vaccination - Plan: Pneumococcal polysaccharide vaccine 23-valent greater than or equal to 2yo subcutaneous/IM  Osteopenia, unspecified location - repeat DEXA recommended. Discussed Ca, D and weight-bearing exercise - Plan: DG Bone Density  Postmenopausal estrogen deficiency - Plan: DG Bone Density  Need for hepatitis C screening test - Plan: Hepatitis C antibody  Right ankle pain, unspecified chronicity - focal area of swelling, ddx reviewed (cyst, lipoma, bursitis, etc). no e/o sprain. Declined foot/ankle  referral. Will try compression   Discussed monthly self breast exams and yearly mammograms (past due); at least 30 minutes of aerobic activity at least 5 days/week and weight-bearing exercise 2x/week; proper sunscreen use reviewed; healthy diet, including goals of calcium and vitamin D intake and alcohol recommendations (less than or equal to 1 drink/day) reviewed; regular seatbelt use; changing batteries in smoke detectors, use of carbon monoxide detectors.  Immunization recommendations discussed--continue yearly high dose flu shots. Pneumovax booster given today. Past due for Tdap, to get from pharmacy. Colonoscopy recommendations reviewed, past due, but has been on hold until cleared by  cardiologist (not within the year of needing dual antiplatelet therapy, should be okay now, a year later).  To check with cardiologist for clearance and reschedule.  MOST form reviewed and unchanged She is Full Code, Full Care   Medicare Attestation I have personally reviewed: The patient's medical and social history Their use of alcohol, tobacco or illicit drugs Their current medications and supplements The patient's functional ability including ADLs,fall risks, home safety risks, cognitive, and hearing and visual impairment Diet and physical activities Evidence for depression or mood disorders  The patient's weight, height, BMI have been recorded in the chart.  I have made referrals, counseling, and provided education to the patient based on review of the above and I have provided the patient with a written personalized care plan for preventive services.     Vikki Ports, MD

## 2020-05-09 ENCOUNTER — Ambulatory Visit (INDEPENDENT_AMBULATORY_CARE_PROVIDER_SITE_OTHER): Payer: Medicare Other | Admitting: Family Medicine

## 2020-05-09 ENCOUNTER — Other Ambulatory Visit: Payer: Self-pay

## 2020-05-09 ENCOUNTER — Encounter: Payer: Self-pay | Admitting: Family Medicine

## 2020-05-09 VITALS — BP 160/90 | HR 72 | Ht 65.0 in | Wt 136.0 lb

## 2020-05-09 DIAGNOSIS — Z8679 Personal history of other diseases of the circulatory system: Secondary | ICD-10-CM | POA: Diagnosis not present

## 2020-05-09 DIAGNOSIS — Z0000 Encounter for general adult medical examination without abnormal findings: Secondary | ICD-10-CM

## 2020-05-09 DIAGNOSIS — M8580 Other specified disorders of bone density and structure, unspecified site: Secondary | ICD-10-CM

## 2020-05-09 DIAGNOSIS — I5032 Chronic diastolic (congestive) heart failure: Secondary | ICD-10-CM | POA: Diagnosis not present

## 2020-05-09 DIAGNOSIS — Z780 Asymptomatic menopausal state: Secondary | ICD-10-CM | POA: Diagnosis not present

## 2020-05-09 DIAGNOSIS — Z23 Encounter for immunization: Secondary | ICD-10-CM

## 2020-05-09 DIAGNOSIS — Z1159 Encounter for screening for other viral diseases: Secondary | ICD-10-CM

## 2020-05-09 DIAGNOSIS — I10 Essential (primary) hypertension: Secondary | ICD-10-CM

## 2020-05-09 DIAGNOSIS — E7800 Pure hypercholesterolemia, unspecified: Secondary | ICD-10-CM | POA: Diagnosis not present

## 2020-05-09 DIAGNOSIS — Z9889 Other specified postprocedural states: Secondary | ICD-10-CM | POA: Diagnosis not present

## 2020-05-09 DIAGNOSIS — I25719 Atherosclerosis of autologous vein coronary artery bypass graft(s) with unspecified angina pectoris: Secondary | ICD-10-CM | POA: Diagnosis not present

## 2020-05-09 DIAGNOSIS — Z5181 Encounter for therapeutic drug level monitoring: Secondary | ICD-10-CM

## 2020-05-09 DIAGNOSIS — M25571 Pain in right ankle and joints of right foot: Secondary | ICD-10-CM | POA: Diagnosis not present

## 2020-05-10 LAB — COMPREHENSIVE METABOLIC PANEL
ALT: 13 IU/L (ref 0–32)
AST: 20 IU/L (ref 0–40)
Albumin/Globulin Ratio: 1.6 (ref 1.2–2.2)
Albumin: 4.6 g/dL (ref 3.7–4.7)
Alkaline Phosphatase: 55 IU/L (ref 44–121)
BUN/Creatinine Ratio: 16 (ref 12–28)
BUN: 14 mg/dL (ref 8–27)
Bilirubin Total: 0.3 mg/dL (ref 0.0–1.2)
CO2: 26 mmol/L (ref 20–29)
Calcium: 9.5 mg/dL (ref 8.7–10.3)
Chloride: 100 mmol/L (ref 96–106)
Creatinine, Ser: 0.86 mg/dL (ref 0.57–1.00)
GFR calc Af Amer: 75 mL/min/{1.73_m2} (ref >59)
GFR calc non Af Amer: 65 mL/min/{1.73_m2} (ref >59)
Globulin, Total: 2.9 g/dL (ref 1.5–4.5)
Glucose: 98 mg/dL (ref 65–99)
Potassium: 3.9 mmol/L (ref 3.5–5.2)
Sodium: 139 mmol/L (ref 134–144)
Total Protein: 7.5 g/dL (ref 6.0–8.5)

## 2020-05-10 LAB — CBC WITH DIFFERENTIAL/PLATELET
Basophils Absolute: 0.1 10*3/uL (ref 0.0–0.2)
Basos: 1 %
EOS (ABSOLUTE): 0.4 10*3/uL (ref 0.0–0.4)
Eos: 4 %
Hematocrit: 40 % (ref 34.0–46.6)
Hemoglobin: 13.7 g/dL (ref 11.1–15.9)
Immature Grans (Abs): 0 10*3/uL (ref 0.0–0.1)
Immature Granulocytes: 0 %
Lymphocytes Absolute: 2.1 10*3/uL (ref 0.7–3.1)
Lymphs: 23 %
MCH: 31.2 pg (ref 26.6–33.0)
MCHC: 34.3 g/dL (ref 31.5–35.7)
MCV: 91 fL (ref 79–97)
Monocytes Absolute: 1 10*3/uL — ABNORMAL HIGH (ref 0.1–0.9)
Monocytes: 10 %
Neutrophils Absolute: 5.9 10*3/uL (ref 1.4–7.0)
Neutrophils: 62 %
Platelets: 219 10*3/uL (ref 150–450)
RBC: 4.39 x10E6/uL (ref 3.77–5.28)
RDW: 11.7 % (ref 11.7–15.4)
WBC: 9.4 10*3/uL (ref 3.4–10.8)

## 2020-05-10 LAB — LIPID PANEL
Chol/HDL Ratio: 3.2 ratio (ref 0.0–4.4)
Cholesterol, Total: 183 mg/dL (ref 100–199)
HDL: 57 mg/dL (ref >39)
LDL Chol Calc (NIH): 103 mg/dL — ABNORMAL HIGH (ref 0–99)
Triglycerides: 132 mg/dL (ref 0–149)
VLDL Cholesterol Cal: 23 mg/dL (ref 5–40)

## 2020-05-10 LAB — HEPATITIS C ANTIBODY: Hep C Virus Ab: <0.1 s/co ratio (ref 0.0–0.9)

## 2020-05-12 ENCOUNTER — Other Ambulatory Visit: Payer: Self-pay | Admitting: Interventional Cardiology

## 2020-05-30 ENCOUNTER — Ambulatory Visit: Payer: Medicare Other | Admitting: Family Medicine

## 2020-06-07 MED ORDER — ROSUVASTATIN CALCIUM 10 MG PO TABS: 10.0000 mg | ORAL_TABLET | Freq: Every day | ORAL | 0 refills | Status: DC

## 2020-07-03 NOTE — Progress Notes (Deleted)
Cardiology Office Note:    Date:  07/03/2020   ID:  Claudia Howell, DOB 04/10/1941, MRN [id number]  PCP:  Rita Ohara, MD  Cardiologist:  Sinclair Grooms, MD   Referring MD: Rita Ohara, MD   No chief complaint on file.   History of Present Illness:    Claudia Howell is a 80 y.o. female with a hx of coronary artery disease with bypass graft(January/2014), saphenous vein graft stent(10/2012), hypertension, acute on chronic systolic and diastolic heart failure, history of paroxysmal atrial fibrillation,Maze procedure doing coronary bypass grafting, and essential hypertension.Unstable angina 04/28/2019 treated with RCA CTO and stent ([place]).  ***  Past Medical History:  Diagnosis Date  . Adenomatous colon polyp 10/09  . CHF (congestive heart failure) ([place])   . Constipation   . Coronary artery disease    Last cath 12/14 Cath 12/8 100% SVG occlusion to RCA, 95% SVG.  The stenosis to OM.  LIMA to LAD patent but diffuse disease in LAD after graft.  Overlapping stents mid/distal SVG to OM.  Marland Kitchen Decubitus ulcer of coccyx 10/22/12   1/2 inch raw open area on coccyx  . Hyperlipidemia   . Hypertension    Dr.Cyndal Kasson Tamala Julian  . Osteopenia 9/09  . PAF (paroxysmal atrial fibrillation) ([place])    during cath/notes 10/16/2012  . S/P CABG x 3 10/22/2012   LIMA to LAD, SVG to distal RCA, SVG to OM, EVH from right thigh  . S/P Maze operation for atrial fibrillation 10/22/2012   Left side lesion set using bipolar radiofrequency and cryothermy ablation with clipping of LA appendage    Past Surgical History:  Procedure Laterality Date  . BREAST CYST EXCISION Left 1980-1990's   x 3  . CARDIAC CATHETERIZATION  10/16/2012  . CATARACT EXTRACTION, BILATERAL Bilateral    03/2015 and 05/2015  . CATARACT EXTRACTION, BILATERAL Bilateral approx 2016  . CORONARY ANGIOPLASTY WITH STENT PLACEMENT  04/01/2013   "1" (06/01/2013)  . CORONARY ARTERY BYPASS GRAFT N/A 10/22/2012   Procedure: CORONARY ARTERY BYPASS GRAFTING  (CABG);  Surgeon: Rexene Alberts, MD;  Location: [facility];  Service: Open Heart Surgery;  Laterality: N/A;  . CORONARY ATHERECTOMY N/A 04/29/2019   Procedure: CORONARY ATHERECTOMY;  Surgeon: Martinique, Peter M, MD;  Location: [facility];  Service: Cardiovascular;  Laterality: N/A;  . CORONARY CTO INTERVENTION N/A 04/29/2019   Procedure: CORONARY CTO INTERVENTION;  Surgeon: Martinique, Peter M, MD;  Location: [facility];  Service: Cardiovascular;  Laterality: N/A;  . CORONARY STENT INTERVENTION N/A 01/01/2019   Procedure: CORONARY STENT INTERVENTION;  Surgeon: Belva Crome, MD;  Location: [facility];  Service: Cardiovascular;  Laterality: N/A;  . DILATION AND CURETTAGE OF UTERUS    . INTRAOPERATIVE TRANSESOPHAGEAL ECHOCARDIOGRAM N/A 10/22/2012   Procedure: INTRAOPERATIVE TRANSESOPHAGEAL ECHOCARDIOGRAM;  Surgeon: Rexene Alberts, MD;  Location: [facility];  Service: Open Heart Surgery;  Laterality: N/A;  . LEFT HEART CATH AND CORS/GRAFTS ANGIOGRAPHY N/A 01/01/2019   Procedure: LEFT HEART CATH AND CORS/GRAFTS ANGIOGRAPHY;  Surgeon: Belva Crome, MD;  Location: [facility];  Service: Cardiovascular;  Laterality: N/A;  . LEFT HEART CATH AND CORS/GRAFTS ANGIOGRAPHY N/A 04/28/2019   Procedure: LEFT HEART CATH AND CORS/GRAFTS ANGIOGRAPHY;  Surgeon: Belva Crome, MD;  Location: [facility];  Service: Cardiovascular;  Laterality: N/A;  . LEFT HEART CATHETERIZATION WITH CORONARY ANGIOGRAM N/A 10/16/2012   Procedure: LEFT HEART CATHETERIZATION WITH CORONARY ANGIOGRAM;  Surgeon: Belva Crome III,  MD;  Location: [facility] CATH LAB;  Service: Cardiovascular;  Laterality: N/A;  . LEFT HEART CATHETERIZATION WITH CORONARY/GRAFT ANGIOGRAM N/A 06/01/2013   Procedure: LEFT HEART CATHETERIZATION WITH Beatrix Fetters;  Surgeon: Sinclair Grooms, MD;  [facility];  Service: Cardiovascular;  Laterality: N/A;  . MAZE N/A 10/22/2012   Procedure: MAZE;  Surgeon: Rexene Alberts, MD;   Location: [facility];  Service: Open Heart Surgery;  Laterality: N/A;  . PERCUTANEOUS CORONARY STENT INTERVENTION (PCI-S)  06/01/2013   Procedure: PERCUTANEOUS CORONARY STENT INTERVENTION (PCI-S);  Surgeon: Sinclair Grooms, MD;  Location: [facility] CATH LAB;  Service: Cardiovascular;;  . TONSILLECTOMY  1949  . TOTAL ABDOMINAL HYSTERECTOMY W/ BILATERAL SALPINGOOPHORECTOMY  1990   benign growth    Current Medications: No outpatient medications have been marked as taking for the 07/08/20 encounter (Appointment) with Belva Crome, MD.     Allergies:   Contrast media [iodinated diagnostic agents], Zetia [ezetimibe], Oxycodone, and Repatha [evolocumab]   Social History   Socioeconomic History  . Marital status: Widowed    Spouse name: Not on file  . Number of children: 2  . Years of education: 58  . Highest education level: Associate degree: occupational, Hotel manager, or vocational program  Occupational History  . Occupation: retired    Comment: LPN  Tobacco Use  . Smoking status: Never Smoker  . Smokeless tobacco: Never Used  Vaping Use  . Vaping Use: Never used  Substance and Sexual Activity  . Alcohol use: Yes    Alcohol/week: 2.0 standard drinks    Types: 2 Glasses of wine per week    Comment: 2 glasses of wine/week  . Drug use: No  . Sexual activity: Not Currently  Other Topics Concern  . 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  1 son in [place], 1 son in [place]. 7 grandchildren, 2 great granddaughters, 1 great grandson (in [place])   Social Determinants of Health   Financial Resource Strain: Not on file  Food Insecurity: Not on file  Transportation Needs: Not on file  Physical Activity: Not on file  Stress: Not on file  Social Connections: Not on file     Family History: The patient's family history includes Cancer in her brother; Heart attack (age of onset: 28) in her mother; Heart disease in an other family member; Heart disease (age of onset: 64)  in her brother; Hypertension in her brother and mother; Prostate cancer in her brother. There is no history of Diabetes, Breast cancer, or Colon cancer.  ROS:   Please see the history of present illness.    *** All other systems reviewed and are negative.  EKGs/Labs/Other Studies Reviewed:    The following studies were reviewed today: ***  EKG:  EKG ***  Recent Labs: 05/09/2020: ALT 13; BUN 14; Creatinine, Ser 0.86; Hemoglobin 13.7; Platelets 219; Potassium 3.9; Sodium 139  Recent Lipid Panel    Component Value Date/Time   CHOL 183 05/09/2020 1558   TRIG 132 05/09/2020 1558   HDL 57 05/09/2020 1558   CHOLHDL 3.2 05/09/2020 1558   CHOLHDL 2.9 04/25/2019 0819   VLDL 21 04/25/2019 0819   LDLCALC 103 (H) 05/09/2020 1558    Physical Exam:    VS:  There were no vitals taken for this visit.    Wt Readings from Last 3 Encounters:  05/09/20 136 lb (61.7 kg)  09/23/19 137 lb 3.2 oz (62.2 kg)  06/24/19 134  lb 7.7 oz (61 kg)     GEN: ***. No acute distress HEENT: Normal NECK: No JVD. LYMPHATICS: No lymphadenopathy CARDIAC: *** murmur. RRR *** gallop, or edema. VASCULAR: *** Normal Pulses. No bruits. RESPIRATORY:  Clear to auscultation without rales, wheezing or rhonchi  ABDOMEN: Soft, non-tender, non-distended, No pulsatile mass, MUSCULOSKELETAL: No deformity  SKIN: Warm and dry NEUROLOGIC:  Alert and oriented x 3 PSYCHIATRIC:  Normal affect   ASSESSMENT:    1. Coronary artery disease of bypass graft of native heart with stable angina pectoris (HCC)   2. Paroxysmal atrial fibrillation ([place])   3. Essential hypertension, benign   4. Chronic diastolic heart failure (HCC)   5. Other hyperlipidemia   6. S/P Maze operation for atrial fibrillation   7. Educated about COVID-19 virus infection    PLAN:    In order of problems listed above:  1. ***   Medication Adjustments/Labs and Tests Ordered: Current medicines are reviewed at length with the patient today.  Concerns  regarding medicines are outlined above.  No orders of the defined types were placed in this encounter.  No orders of the defined types were placed in this encounter.   There are no Patient Instructions on file for this visit.   Signed, Sinclair Grooms, MD  07/03/2020 11:34 AM    [place]

## 2020-07-08 ENCOUNTER — Ambulatory Visit: Payer: Medicare Other | Admitting: Interventional Cardiology

## 2020-07-08 DIAGNOSIS — I480 Paroxysmal atrial fibrillation: Secondary | ICD-10-CM

## 2020-07-08 DIAGNOSIS — I25708 Atherosclerosis of coronary artery bypass graft(s), unspecified, with other forms of angina pectoris: Secondary | ICD-10-CM

## 2020-07-08 DIAGNOSIS — I10 Essential (primary) hypertension: Secondary | ICD-10-CM

## 2020-07-08 DIAGNOSIS — I5032 Chronic diastolic (congestive) heart failure: Secondary | ICD-10-CM

## 2020-07-08 DIAGNOSIS — E7849 Other hyperlipidemia: Secondary | ICD-10-CM

## 2020-07-08 DIAGNOSIS — Z8679 Personal history of other diseases of the circulatory system: Secondary | ICD-10-CM

## 2020-07-08 DIAGNOSIS — Z7189 Other specified counseling: Secondary | ICD-10-CM

## 2020-07-14 ENCOUNTER — Other Ambulatory Visit: Payer: Self-pay | Admitting: Interventional Cardiology

## 2020-07-14 MED ORDER — ATENOLOL 100 MG PO TABS: 100.0000 mg | ORAL_TABLET | Freq: Every day | ORAL | 0 refills | Status: DC

## 2020-07-18 ENCOUNTER — Other Ambulatory Visit: Payer: Self-pay | Admitting: Family Medicine

## 2020-07-18 DIAGNOSIS — M8580 Other specified disorders of bone density and structure, unspecified site: Secondary | ICD-10-CM

## 2020-07-18 DIAGNOSIS — Z780 Asymptomatic menopausal state: Secondary | ICD-10-CM

## 2020-07-18 DIAGNOSIS — Z1231 Encounter for screening mammogram for malignant neoplasm of breast: Secondary | ICD-10-CM

## 2020-07-25 NOTE — Progress Notes (Signed)
Cardiology Office Note:    Date:  07/26/2020   ID:  Claudia Howell, DOB Mar 30, 1941, MRN [id number]  PCP:  Rita Ohara, MD  Cardiologist:  Sinclair Grooms, MD   Referring MD: Rita Ohara, MD   Chief Complaint  Patient presents with  . Coronary Artery Disease  . Hyperlipidemia  . Hypertension    History of Present Illness:    Claudia Howell is a 80 y.o. female with a hx of  coronary artery disease with bypass graft(January/2014), saphenous vein graft stent(10/2012), hypertension, acute on chronic systolic and diastolic heart failure, history of paroxysmal atrial fibrillation,Maze procedure doing coronary bypass grafting, and essential hypertension.Unstable angina 04/28/2019 treated with RCA CTO and stent ([place]).  Claudia Howell is doing well.  She is remaining active at home.  She is having minimal if any angina.  Her nitroglycerin tablets have disintegrated.  She has right ankle discomfort where there appears to be a lipoma developing.  She has not had neurological symptoms.  She is consistently taking rosuvastatin 10 mg/day.  Past Medical History:  Diagnosis Date  . Adenomatous colon polyp 10/09  . CHF (congestive heart failure) ([place])   . Constipation   . Coronary artery disease    Last cath 12/14 Cath 12/8 100% SVG occlusion to RCA, 95% SVG.  The stenosis to OM.  LIMA to LAD patent but diffuse disease in LAD after graft.  Overlapping stents mid/distal SVG to OM.  Marland Kitchen Decubitus ulcer of coccyx 10/22/12   1/2 inch raw open area on coccyx  . Hyperlipidemia   . Hypertension    Dr.Edla Para Tamala Julian  . Osteopenia 9/09  . PAF (paroxysmal atrial fibrillation) ([place])    during cath/notes 10/16/2012  . S/P CABG x 3 10/22/2012   LIMA to LAD, SVG to distal RCA, SVG to OM, EVH from right thigh  . S/P Maze operation for atrial fibrillation 10/22/2012   Left side lesion set using bipolar radiofrequency and cryothermy ablation with clipping of LA appendage    Past Surgical History:  Procedure Laterality  Date  . BREAST CYST EXCISION Left 1980-1990's   x 3  . CARDIAC CATHETERIZATION  10/16/2012  . CATARACT EXTRACTION, BILATERAL Bilateral    03/2015 and 05/2015  . CATARACT EXTRACTION, BILATERAL Bilateral approx 2016  . CORONARY ANGIOPLASTY WITH STENT PLACEMENT  04/01/2013   "1" (06/01/2013)  . CORONARY ARTERY BYPASS GRAFT N/A 10/22/2012   Procedure: CORONARY ARTERY BYPASS GRAFTING (CABG);  Surgeon: Rexene Alberts, MD;  Location: [facility];  Service: Open Heart Surgery;  Laterality: N/A;  . CORONARY ATHERECTOMY N/A 04/29/2019   Procedure: CORONARY ATHERECTOMY;  Surgeon: Martinique, Peter M, MD;  Location: [facility];  Service: Cardiovascular;  Laterality: N/A;  . CORONARY CTO INTERVENTION N/A 04/29/2019   Procedure: CORONARY CTO INTERVENTION;  Surgeon: Martinique, Peter M, MD;  Location: [facility];  Service: Cardiovascular;  Laterality: N/A;  . CORONARY STENT INTERVENTION N/A 01/01/2019   Procedure: CORONARY STENT INTERVENTION;  Surgeon: Belva Crome, MD;  Location: [facility];  Service: Cardiovascular;  Laterality: N/A;  . DILATION AND CURETTAGE OF UTERUS    . INTRAOPERATIVE TRANSESOPHAGEAL ECHOCARDIOGRAM N/A 10/22/2012   Procedure: INTRAOPERATIVE TRANSESOPHAGEAL ECHOCARDIOGRAM;  Surgeon: Rexene Alberts, MD;  Location: [facility];  Service: Open Heart Surgery;  Laterality: N/A;  . LEFT HEART CATH AND CORS/GRAFTS ANGIOGRAPHY N/A 01/01/2019   Procedure: LEFT HEART CATH AND CORS/GRAFTS ANGIOGRAPHY;  Surgeon: Belva Crome, MD;  Location: [facility];  Service: Cardiovascular;  Laterality: N/A;  . LEFT HEART CATH AND CORS/GRAFTS ANGIOGRAPHY N/A 04/28/2019   Procedure: LEFT HEART CATH AND CORS/GRAFTS ANGIOGRAPHY;  Surgeon: Belva Crome, MD;  Location: [facility];  Service: Cardiovascular;  Laterality: N/A;  . LEFT HEART CATHETERIZATION WITH CORONARY ANGIOGRAM N/A 10/16/2012   Procedure: LEFT HEART CATHETERIZATION WITH CORONARY ANGIOGRAM;  Surgeon: Sinclair Grooms, MD;  Location:  [facility] CATH LAB;  Service: Cardiovascular;  Laterality: N/A;  . LEFT HEART CATHETERIZATION WITH CORONARY/GRAFT ANGIOGRAM N/A 06/01/2013   Procedure: LEFT HEART CATHETERIZATION WITH Beatrix Fetters;  Surgeon: Sinclair Grooms, MD;  Location: [facility] CATH LAB;  Service: Cardiovascular;  Laterality: N/A;  . MAZE N/A 10/22/2012   Procedure: MAZE;  Surgeon: Rexene Alberts, MD;  Location: [facility];  Service: Open Heart Surgery;  Laterality: N/A;  . PERCUTANEOUS CORONARY STENT INTERVENTION (PCI-S)  06/01/2013   Procedure: PERCUTANEOUS CORONARY STENT INTERVENTION (PCI-S);  Surgeon: Sinclair Grooms, MD;  Location: [facility] CATH LAB;  Service: Cardiovascular;;  . TONSILLECTOMY  1949  . TOTAL ABDOMINAL HYSTERECTOMY W/ BILATERAL SALPINGOOPHORECTOMY  1990   benign growth    Current Medications: Current Meds  Medication Sig  . aspirin EC 81 MG tablet Take 1 tablet (81 mg total) by mouth daily.  . cholecalciferol (VITAMIN D) 1000 UNITS tablet Take 1,000 Units by mouth daily.  . Coenzyme Q10 (COQ10 PO) Take 1 capsule by mouth daily.  Marland Kitchen GLUCOSAMINE-CHONDROITIN-VIT D3 PO Take 1 tablet by mouth 2 (two) times a week.   . Multiple Vitamin (MULTIVITAMIN) tablet Take 1 tablet by mouth daily.  . potassium chloride (K-DUR) 10 MEQ tablet Take 10 mEq by mouth every 3 (three) days.   Marland Kitchen psyllium (METAMUCIL) 58.6 % packet Take 1 packet by mouth 2 (two) times daily.  . [DISCONTINUED] atenolol (TENORMIN) 100 MG tablet Take 1 tablet (100 mg total) by mouth daily. Please keep upcoming appt in February 2022 with Dr. Tamala Julian before anymore refills. Thank you  . [DISCONTINUED] benazepril (LOTENSIN) 40 MG tablet Take 1 tablet (40 mg total) by mouth daily.  . [DISCONTINUED] clopidogrel (PLAVIX) 75 MG tablet Take 1 tablet (75 mg total) by mouth daily with breakfast. Please keep upcoming appt in February 2022 with Dr. Tamala Julian before anymore refills. Thank you  . [DISCONTINUED] furosemide (LASIX) 20 MG tablet Take 1 tablet (20 mg total) by  mouth as needed for fluid or edema. Reported on 12/19/2015  . [DISCONTINUED] hydrochlorothiazide (HYDRODIURIL) 25 MG tablet Take 1 tablet (25 mg total) by mouth daily. Please keep upcoming appt in January 2022 with Dr. Tamala Julian before anymore refills. Thank you  . [DISCONTINUED] nitroGLYCERIN (NITROSTAT) 0.4 MG SL tablet Place 1 tablet (0.4 mg total) under the tongue every 5 (five) minutes as needed for chest pain.  . [DISCONTINUED] rosuvastatin (CRESTOR) 10 MG tablet Take 1 tablet (10 mg total) by mouth daily.     Allergies:   Contrast media [iodinated diagnostic agents], Zetia [ezetimibe], Oxycodone, and Repatha [evolocumab]   Social History   Socioeconomic History  . Marital status: Widowed    Spouse name: Not on file  . Number of children: 2  . Years of education: 36  . Highest education level: Associate degree: occupational, Hotel manager, or vocational program  Occupational History  . Occupation: retired    Comment: LPN  Tobacco Use  . Smoking status: Never Smoker  . Smokeless tobacco: Never Used  Vaping Use  . Vaping Use: Never used  Substance and Sexual Activity  .  Alcohol use: Yes    Alcohol/week: 2.0 standard drinks    Types: 2 Glasses of wine per week    Comment: 2 glasses of wine/week  . Drug use: No  . Sexual activity: Not Currently  Other Topics Concern  . 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  1 son in [place], 1 son in [place]. 7 grandchildren, 2 great granddaughters, 1 great grandson (in [place])   Social Determinants of Health   Financial Resource Strain: Not on file  Food Insecurity: Not on file  Transportation Needs: Not on file  Physical Activity: Not on file  Stress: Not on file  Social Connections: Not on file     Family History: The patient's family history includes Cancer in her brother; Heart attack (age of onset: 68) in her mother; Heart disease in an other family member; Heart disease (age of onset: 45) in her brother;  Hypertension in her brother and mother; Prostate cancer in her brother. There is no history of Diabetes, Breast cancer, or Colon cancer.  ROS:   Please see the history of present illness.    She wants to have colonoscopy this year All other systems reviewed and are negative.  EKGs/Labs/Other Studies Reviewed:    The following studies were reviewed today: No new imaging data  EKG:  EKG normal sinus rhythm, ventricular bigeminy, interventricular conduction delay.  Vertical axis.  Since prior tracing in November 2020, bigeminy is new.  Recent Labs: 05/09/2020: ALT 13; BUN 14; Creatinine, Ser 0.86; Hemoglobin 13.7; Platelets 219; Potassium 3.9; Sodium 139  Recent Lipid Panel    Component Value Date/Time   CHOL 183 05/09/2020 1558   TRIG 132 05/09/2020 1558   HDL 57 05/09/2020 1558   CHOLHDL 3.2 05/09/2020 1558   CHOLHDL 2.9 04/25/2019 0819   VLDL 21 04/25/2019 0819   LDLCALC 103 (H) 05/09/2020 1558    Physical Exam:    VS:  BP (!) 152/72   Pulse 81   Ht 5\' 5"  (1.651 m)   Wt 137 lb 3.2 oz (62.2 kg)   SpO2 98%   BMI 22.83 kg/m     Wt Readings from Last 3 Encounters:  07/26/20 137 lb 3.2 oz (62.2 kg)  05/09/20 136 lb (61.7 kg)  09/23/19 137 lb 3.2 oz (62.2 kg)    Blood pressures at home tend to run less than 130/80 mmHg.  She measures her blood pressure frequently. GEN: Slender.. No acute distress HEENT: Normal NECK: No JVD. LYMPHATICS: No lymphadenopathy CARDIAC: 1-2 over 6 left mid sternal systolic murmur. RRR no gallop, or edema. VASCULAR:  Normal Pulses. No bruits. RESPIRATORY:  Clear to auscultation without rales, wheezing or rhonchi  ABDOMEN: Soft, non-tender, non-distended, No pulsatile mass, MUSCULOSKELETAL: No deformity  SKIN: Warm and dry NEUROLOGIC:  Alert and oriented x 3 PSYCHIATRIC:  Normal affect   ASSESSMENT:    1. Coronary artery disease of bypass graft of native heart with stable angina pectoris (HCC)   2. Paroxysmal atrial fibrillation ([place])    3. Other hyperlipidemia   4. Essential hypertension, benign   5. Chronic diastolic heart failure ([place])   6. S/P Maze operation for atrial fibrillation   7. Educated about COVID-19 virus infection    PLAN:    In order of problems listed above:  Overall education and awareness concerning secondary risk prevention was discussed in detail: LDL less than 70, hemoglobin A1c less than 7, blood pressure target less than 130/80 mmHg, >  150 minutes of moderate aerobic activity per week, avoidance of smoking, weight control (via diet and exercise), and continued surveillance/management of/for obstructive sleep apnea.  Also made the decision today to discontinue aspirin and continue Plavix monotherapy.  Plavix can be discontinued 3 to 5 days prior to colonoscopy if needed.  Last intervention was greater than 12 months ago. No episodes of atrial fibrillation have been clinically apparent. Continue Crestor 10 mg/day.  She cannot tolerate larger doses.  She has also had difficulty with Zetia. Continue to monitor blood pressure.  Target blood pressure for her age is 130/80 mmHg.  Continue Tenormin 100 mg daily Lotensin 40 mg daily hydrochlorothiazide 25 mg daily and Klor-Con 10 mEq/day. No clinical evidence of volume overload on exam. Status post maze without recurrent atrial fibrillation. Vaccinated and practicing social distancing.    Medication Adjustments/Labs and Tests Ordered: Current medicines are reviewed at length with the patient today.  Concerns regarding medicines are outlined above.  Orders Placed This Encounter  Procedures  . EKG 12-Lead   Meds ordered this encounter  Medications  . atenolol (TENORMIN) 100 MG tablet    Sig: Take 1 tablet (100 mg total) by mouth daily.    Dispense:  90 tablet    Refill:  3  . benazepril (LOTENSIN) 40 MG tablet    Sig: Take 1 tablet (40 mg total) by mouth daily.    Dispense:  90 tablet    Refill:  3  . clopidogrel (PLAVIX) 75 MG tablet    Sig: Take  1 tablet (75 mg total) by mouth daily with breakfast.    Dispense:  90 tablet    Refill:  3  . furosemide (LASIX) 20 MG tablet    Sig: Take 1 tablet (20 mg total) by mouth as needed for fluid or edema.    Dispense:  30 tablet    Refill:  3  . hydrochlorothiazide (HYDRODIURIL) 25 MG tablet    Sig: Take 1 tablet (25 mg total) by mouth daily.    Dispense:  90 tablet    Refill:  3  . nitroGLYCERIN (NITROSTAT) 0.4 MG SL tablet    Sig: Place 1 tablet (0.4 mg total) under the tongue every 5 (five) minutes as needed for chest pain.    Dispense:  25 tablet    Refill:  6  . rosuvastatin (CRESTOR) 10 MG tablet    Sig: Take 1 tablet (10 mg total) by mouth daily.    Dispense:  90 tablet    Refill:  3    There are no Patient Instructions on file for this visit.   Signed, Sinclair Grooms, MD  07/26/2020 2:04 PM    [place]

## 2020-07-26 ENCOUNTER — Encounter: Payer: Self-pay | Admitting: Interventional Cardiology

## 2020-07-26 ENCOUNTER — Ambulatory Visit (INDEPENDENT_AMBULATORY_CARE_PROVIDER_SITE_OTHER): Payer: Medicare Other | Admitting: Interventional Cardiology

## 2020-07-26 ENCOUNTER — Other Ambulatory Visit: Payer: Self-pay

## 2020-07-26 VITALS — BP 152/72 | HR 81 | Ht 65.0 in | Wt 137.2 lb

## 2020-07-26 DIAGNOSIS — Z7189 Other specified counseling: Secondary | ICD-10-CM

## 2020-07-26 DIAGNOSIS — I10 Essential (primary) hypertension: Secondary | ICD-10-CM

## 2020-07-26 DIAGNOSIS — I25708 Atherosclerosis of coronary artery bypass graft(s), unspecified, with other forms of angina pectoris: Secondary | ICD-10-CM

## 2020-07-26 DIAGNOSIS — I480 Paroxysmal atrial fibrillation: Secondary | ICD-10-CM

## 2020-07-26 DIAGNOSIS — E7849 Other hyperlipidemia: Secondary | ICD-10-CM | POA: Diagnosis not present

## 2020-07-26 DIAGNOSIS — I5032 Chronic diastolic (congestive) heart failure: Secondary | ICD-10-CM | POA: Diagnosis not present

## 2020-07-26 DIAGNOSIS — Z8679 Personal history of other diseases of the circulatory system: Secondary | ICD-10-CM

## 2020-07-26 DIAGNOSIS — Z9889 Other specified postprocedural states: Secondary | ICD-10-CM | POA: Diagnosis not present

## 2020-07-26 MED ORDER — ROSUVASTATIN CALCIUM 10 MG PO TABS: 10.0000 mg | ORAL_TABLET | Freq: Every day | ORAL | 3 refills | Status: DC

## 2020-07-26 MED ORDER — CLOPIDOGREL BISULFATE 75 MG PO TABS: 75.0000 mg | ORAL_TABLET | Freq: Every day | ORAL | 3 refills | Status: DC

## 2020-07-26 MED ORDER — BENAZEPRIL HCL 40 MG PO TABS: 40.0000 mg | ORAL_TABLET | Freq: Every day | ORAL | 3 refills | Status: DC

## 2020-07-26 MED ORDER — ATENOLOL 100 MG PO TABS: 100.0000 mg | ORAL_TABLET | Freq: Every day | ORAL | 3 refills | Status: DC

## 2020-07-26 MED ORDER — HYDROCHLOROTHIAZIDE 25 MG PO TABS: 25.0000 mg | ORAL_TABLET | Freq: Every day | ORAL | 3 refills | Status: DC

## 2020-07-26 MED ORDER — NITROGLYCERIN 0.4 MG SL SUBL: 0.4000 mg | SUBLINGUAL_TABLET | SUBLINGUAL | 6 refills | Status: DC | PRN

## 2020-07-26 MED ORDER — FUROSEMIDE 20 MG PO TABS: 20.0000 mg | ORAL_TABLET | ORAL | 3 refills | Status: DC | PRN

## 2020-07-26 NOTE — Patient Instructions (Signed)
Medication Instructions:  1) DISCONTINUE Aspirin  *If you need a refill on your cardiac medications before your next appointment, please call your pharmacy*   Lab Work: None If you have labs (blood work) drawn today and your tests are completely normal, you will receive your results only by: Marland Kitchen MyChart Message (if you have MyChart) OR . A paper copy in the mail If you have any lab test that is abnormal or we need to change your treatment, we will call you to review the results.   Testing/Procedures: None   Follow-Up: At [facility], you and your health needs are our priority.  As part of our continuing mission to provide you with exceptional heart care, we have created designated Provider Care Teams.  These Care Teams include your primary Cardiologist (physician) and Advanced Practice Providers (APPs -  Physician Assistants and Nurse Practitioners) who all work together to provide you with the care you need, when you need it.  We recommend signing up for the patient portal called "MyChart".  Sign up information is provided on this After Visit Summary.  MyChart is used to connect with patients for Virtual Visits (Telemedicine).  Patients are able to view lab/test results, encounter notes, upcoming appointments, etc.  Non-urgent messages can be sent to your provider as well.   To learn more about what you can do with MyChart, go to [email or web address].    Your next appointment:   1 year(s)  The format for your next appointment:   In Person  Provider:   You may see Sinclair Grooms, MD or one of the following Advanced Practice Providers on your designated Care Team:    Kathyrn Drown, NP    Other Instructions

## 2020-07-28 ENCOUNTER — Encounter: Payer: Self-pay | Admitting: Family Medicine

## 2020-07-28 DIAGNOSIS — M25571 Pain in right ankle and joints of right foot: Secondary | ICD-10-CM

## 2020-08-01 ENCOUNTER — Ambulatory Visit: Payer: Medicare Other | Admitting: Podiatry

## 2020-08-03 ENCOUNTER — Encounter: Payer: Self-pay | Admitting: Podiatry

## 2020-08-03 ENCOUNTER — Ambulatory Visit (INDEPENDENT_AMBULATORY_CARE_PROVIDER_SITE_OTHER): Payer: Medicare Other

## 2020-08-03 ENCOUNTER — Other Ambulatory Visit: Payer: Self-pay

## 2020-08-03 ENCOUNTER — Ambulatory Visit (INDEPENDENT_AMBULATORY_CARE_PROVIDER_SITE_OTHER): Payer: Medicare Other | Admitting: Podiatry

## 2020-08-03 DIAGNOSIS — M7671 Peroneal tendinitis, right leg: Secondary | ICD-10-CM | POA: Diagnosis not present

## 2020-08-03 DIAGNOSIS — M25571 Pain in right ankle and joints of right foot: Secondary | ICD-10-CM

## 2020-08-03 DIAGNOSIS — I25708 Atherosclerosis of coronary artery bypass graft(s), unspecified, with other forms of angina pectoris: Secondary | ICD-10-CM

## 2020-08-03 MED ORDER — TRIAMCINOLONE ACETONIDE 10 MG/ML IJ SUSP
10.0000 mg | Freq: Once | INTRAMUSCULAR | Status: AC
  Administered 2020-08-03: 10 mg

## 2020-08-03 NOTE — Progress Notes (Signed)
Subjective:   Patient ID: Claudia Howell, female   DOB: 80 y.o.   MRN: [id number]   HPI Patient presents stating she has a lot of pain in her right outside foot does not remember injury states is been present for 5 months but is worsened over the last month or 2.  Patient does not remember any other pathology associated with this and does not smoke likes to be active   Review of Systems  All other systems reviewed and are negative.       Objective:  Physical Exam Vitals and nursing note reviewed.  Constitutional:      Appearance: She is well-developed and well-nourished.  Cardiovascular:     Pulses: Intact distal pulses.  Pulmonary:     Effort: Pulmonary effort is normal.  Musculoskeletal:        General: Normal range of motion.  Skin:    General: Skin is warm.  Neurological:     Mental Status: She is alert.     Neurovascular status was found to be intact muscle strength was found to be adequate range of motion is adequate no indication tendon dysfunction or tear.  There is inflammation around the peroneal tendon as it comes under the lateral malleolus with mild discomfort into the sinus tarsi noted.  Patient has good digital perfusion well oriented x3     Assessment:  Appears to be peroneal tendinitis right with possibility of interstitial tear but most likely inflammatory with possible sinus tarsitis     Plan:  H&P reviewed condition did careful injection of the peroneal tendon as it comes under the lateral nail is 3 mg Dexasone Kenalog 5 mg Xylocaine applied fascial brace to lift up the lateral foot begin ice and topical anti-inflammatory usage  X-rays indicate no indications of advanced arthritis possibility for subtalar joint arthritis moderate nature moderate depression of the arch

## 2020-08-16 ENCOUNTER — Ambulatory Visit (INDEPENDENT_AMBULATORY_CARE_PROVIDER_SITE_OTHER): Payer: Medicare Other | Admitting: Ophthalmology

## 2020-08-16 ENCOUNTER — Other Ambulatory Visit: Payer: Self-pay

## 2020-08-16 ENCOUNTER — Encounter (INDEPENDENT_AMBULATORY_CARE_PROVIDER_SITE_OTHER): Payer: Self-pay | Admitting: Ophthalmology

## 2020-08-16 DIAGNOSIS — H43813 Vitreous degeneration, bilateral: Secondary | ICD-10-CM

## 2020-08-16 DIAGNOSIS — H26491 Other secondary cataract, right eye: Secondary | ICD-10-CM

## 2020-08-16 DIAGNOSIS — H353131 Nonexudative age-related macular degeneration, bilateral, early dry stage: Secondary | ICD-10-CM | POA: Diagnosis not present

## 2020-08-16 DIAGNOSIS — H34231 Retinal artery branch occlusion, right eye: Secondary | ICD-10-CM

## 2020-08-16 DIAGNOSIS — H348112 Central retinal vein occlusion, right eye, stable: Secondary | ICD-10-CM | POA: Diagnosis not present

## 2020-08-16 DIAGNOSIS — H43821 Vitreomacular adhesion, right eye: Secondary | ICD-10-CM

## 2020-08-16 NOTE — Assessment & Plan Note (Signed)
Edition resolved improved

## 2020-08-16 NOTE — Assessment & Plan Note (Signed)

## 2020-08-16 NOTE — Assessment & Plan Note (Signed)
Long inferotemporal arcade, prior NVE, treated in the past with local PRP, 08-14-2017

## 2020-08-16 NOTE — Assessment & Plan Note (Signed)
Condition resolved improved

## 2020-08-16 NOTE — Assessment & Plan Note (Signed)
The nature of age--related macular degeneration was discussed with the patient as well as the distinction between dry and wet types. Checking an Amsler Grid daily with advice to return immediately should a distortion develop, was given to the patient. The patient 's smoking status now and in the past was determined and advice based on the AREDS study was provided regarding the consumption of antioxidant supplements. AREDS 2 vitamin formulation was recommended. Consumption of dark leafy vegetables and fresh fruits of various colors was recommended. Treatment modalities for wet macular degeneration particularly the use of intravitreal injections of anti-blood vessel growth factors was discussed with the patient. Avastin, Lucentis, and Eylea are the available options. On occasion, therapy includes the use of photodynamic therapy and thermal laser. Stressed to the patient do not rub eyes.  Patient was advised to check Amsler Grid daily and return immediately if changes are noted. Instructions on using the grid were given to the patient. All patient questions were answered.  Not to intermediate ARMD thus does not qualify for recommendation for oral vitamin therapy

## 2020-08-16 NOTE — Assessment & Plan Note (Signed)
Condition resolved from the past

## 2020-08-16 NOTE — Progress Notes (Signed)
08/16/2020     CHIEF COMPLAINT Patient presents for Retina Follow Up (9 MO FU OU///Pt reports stable vision OU. Pt denies any new F/F, pain, or pressure OU. )   HISTORY OF PRESENT ILLNESS: Claudia Howell is a 80 y.o. female who presents to the clinic today for:   HPI    Retina Follow Up    Patient presents with  Retinal Break/Detachment.  In both eyes.  This started 9 months ago.  Duration of 9 months.  Since onset it is stable. Additional comments: 9 MO FU OU   Pt reports stable vision OU. Pt denies any new F/F, pain, or pressure OU.        Last edited by Donne Hazel on 08/16/2020  8:10 AM. (History)      Referring physician: Rita Ohara, MD [place],  [place] [address]  HISTORICAL INFORMATION:   Selected notes from the MEDICAL RECORD NUMBER    Lab Results  Component Value Date   HGBA1C 5.5 08/10/2013     CURRENT MEDICATIONS: No current outpatient medications on file. (Ophthalmic Drugs)   No current facility-administered medications for this visit. (Ophthalmic Drugs)   Current Outpatient Medications (Other)  Medication Sig  . atenolol (TENORMIN) 100 MG tablet Take 1 tablet (100 mg total) by mouth daily.  . benazepril (LOTENSIN) 40 MG tablet Take 1 tablet (40 mg total) by mouth daily.  . cholecalciferol (VITAMIN D) 1000 UNITS tablet Take 1,000 Units by mouth daily.  . clopidogrel (PLAVIX) 75 MG tablet Take 1 tablet (75 mg total) by mouth daily with breakfast.  . Coenzyme Q10 (COQ10 PO) Take 1 capsule by mouth daily.  . furosemide (LASIX) 20 MG tablet Take 1 tablet (20 mg total) by mouth as needed for fluid or edema.  Marland Kitchen GLUCOSAMINE-CHONDROITIN-VIT D3 PO Take 1 tablet by mouth 2 (two) times a week.   . hydrochlorothiazide (HYDRODIURIL) 25 MG tablet Take 1 tablet (25 mg total) by mouth daily.  . Multiple Vitamin (MULTIVITAMIN) tablet Take 1 tablet by mouth daily.  . nitroGLYCERIN (NITROSTAT) 0.4 MG SL tablet Place 1 tablet (0.4 mg total) under  the tongue every 5 (five) minutes as needed for chest pain.  . potassium chloride (K-DUR) 10 MEQ tablet Take 10 mEq by mouth every 3 (three) days.   Marland Kitchen psyllium (METAMUCIL) 58.6 % packet Take 1 packet by mouth 2 (two) times daily.  . rosuvastatin (CRESTOR) 10 MG tablet Take 1 tablet (10 mg total) by mouth daily.   No current facility-administered medications for this visit. (Other)      REVIEW OF SYSTEMS:    ALLERGIES Allergies  Allergen Reactions  . Contrast Media [Iodinated Diagnostic Agents] Hives  . Zetia [Ezetimibe] Other (See Comments)    Muscle cramps  . Oxycodone Nausea And Vomiting and Other (See Comments)    Extreme nausea and vomiting  . Repatha [Evolocumab] Other (See Comments)    Muscle cramps    PAST MEDICAL HISTORY Past Medical History:  Diagnosis Date  . Adenomatous colon polyp 10/09  . CHF (congestive heart failure) ([employer])   . Constipation   . Coronary artery disease    Last cath 12/14 Cath 12/8 100% SVG occlusion to RCA, 95% SVG.  The stenosis to OM.  LIMA to LAD patent but diffuse disease in LAD after graft.  Overlapping stents mid/distal SVG to OM.  Marland Kitchen Decubitus ulcer of coccyx 10/22/12   1/2 inch raw open area on coccyx  . Hyperlipidemia   .  Hypertension    Dr.Henry Tamala Julian  . Osteopenia 9/09  . PAF (paroxysmal atrial fibrillation) ([place])    during cath/notes 10/16/2012  . Right posterior capsular opacification 11/16/2019  . S/P CABG x 3 10/22/2012   LIMA to LAD, SVG to distal RCA, SVG to OM, EVH from right thigh  . S/P Maze operation for atrial fibrillation 10/22/2012   Left side lesion set using bipolar radiofrequency and cryothermy ablation with clipping of LA appendage  . 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   Past Surgical History:  Procedure Laterality Date  . BREAST CYST EXCISION Left 1980-1990's   x 3  . CARDIAC CATHETERIZATION  10/16/2012  . CATARACT EXTRACTION, BILATERAL Bilateral    03/2015 and 05/2015  . CATARACT EXTRACTION,  BILATERAL Bilateral approx 2016  . CORONARY ANGIOPLASTY WITH STENT PLACEMENT  04/01/2013   "1" (06/01/2013)  . CORONARY ARTERY BYPASS GRAFT N/A 10/22/2012   Procedure: CORONARY ARTERY BYPASS GRAFTING (CABG);  Surgeon: Rexene Alberts, MD;  Location: [facility];  Service: Open Heart Surgery;  Laterality: N/A;  . CORONARY ATHERECTOMY N/A 04/29/2019   Procedure: CORONARY ATHERECTOMY;  Surgeon: Martinique, Peter M, MD;  Location: [facility];  Service: Cardiovascular;  Laterality: N/A;  . CORONARY CTO INTERVENTION N/A 04/29/2019   Procedure: CORONARY CTO INTERVENTION;  Surgeon: Martinique, Peter M, MD;  Location: [facility];  Service: Cardiovascular;  Laterality: N/A;  . CORONARY STENT INTERVENTION N/A 01/01/2019   Procedure: CORONARY STENT INTERVENTION;  Surgeon: Belva Crome, MD;  Location: [facility];  Service: Cardiovascular;  Laterality: N/A;  . DILATION AND CURETTAGE OF UTERUS    . INTRAOPERATIVE TRANSESOPHAGEAL ECHOCARDIOGRAM N/A 10/22/2012   Procedure: INTRAOPERATIVE TRANSESOPHAGEAL ECHOCARDIOGRAM;  Surgeon: Rexene Alberts, MD;  Location: [facility];  Service: Open Heart Surgery;  Laterality: N/A;  . LEFT HEART CATH AND CORS/GRAFTS ANGIOGRAPHY N/A 01/01/2019   Procedure: LEFT HEART CATH AND CORS/GRAFTS ANGIOGRAPHY;  Surgeon: Belva Crome, MD;  Location: [facility];  Service: Cardiovascular;  Laterality: N/A;  . LEFT HEART CATH AND CORS/GRAFTS ANGIOGRAPHY N/A 04/28/2019   Procedure: LEFT HEART CATH AND CORS/GRAFTS ANGIOGRAPHY;  Surgeon: Belva Crome, MD;  Location: [facility];  Service: Cardiovascular;  Laterality: N/A;  . LEFT HEART CATHETERIZATION WITH CORONARY ANGIOGRAM N/A 10/16/2012   Procedure: LEFT HEART CATHETERIZATION WITH CORONARY ANGIOGRAM;  Surgeon: Sinclair Grooms, MD;  Location: [facility] CATH LAB;  Service: Cardiovascular;  Laterality: N/A;  . LEFT HEART CATHETERIZATION WITH CORONARY/GRAFT ANGIOGRAM N/A 06/01/2013   Procedure: LEFT HEART CATHETERIZATION WITH  Beatrix Fetters;  Surgeon: Sinclair Grooms, MD;  Location: [facility] CATH LAB;  Service: Cardiovascular;  Laterality: N/A;  . MAZE N/A 10/22/2012   Procedure: MAZE;  Surgeon: Rexene Alberts, MD;  Location: [facility];  Service: Open Heart Surgery;  Laterality: N/A;  . PERCUTANEOUS CORONARY STENT INTERVENTION (PCI-S)  06/01/2013   Procedure: PERCUTANEOUS CORONARY STENT INTERVENTION (PCI-S);  Surgeon: Sinclair Grooms, MD;  Location: [facility] CATH LAB;  Service: Cardiovascular;;  . TONSILLECTOMY  1949  . TOTAL ABDOMINAL HYSTERECTOMY W/ BILATERAL SALPINGOOPHORECTOMY  1990   benign growth    FAMILY HISTORY Family History  Problem Relation Age of Onset  . Hypertension Mother   . Heart attack Mother 51  . Hypertension Brother   . Cancer Brother        prostate (76), gall bladder (dx 23)  . Heart disease Brother 36       stent  .  Prostate cancer Brother   . Heart disease Other        x2 (age 43 and 41)  . Diabetes Neg Hx   . Breast cancer Neg Hx   . Colon cancer Neg Hx     SOCIAL HISTORY Social History   Tobacco Use  . Smoking status: Never Smoker  . Smokeless tobacco: Never Used  Vaping Use  . Vaping Use: Never used  Substance Use Topics  . Alcohol use: Yes    Alcohol/week: 2.0 standard drinks    Types: 2 Glasses of wine per week    Comment: 2 glasses of wine/week  . Drug use: No         OPHTHALMIC EXAM:  Base Eye Exam    Visual Acuity (ETDRS)      Right Left   Dist cc 20/20 -2 20/20 -1   Correction: Glasses       Tonometry (Tonopen, 8:15 AM)      Right Left   Pressure 13 15       Pupils      Pupils Dark Light Shape React APD   Right PERRL 3 2.5 Round Brisk None   Left PERRL 3 2.5 Round Brisk None       Visual Fields (Counting fingers)      Left Right    Full    Restrictions  Partial outer inferior nasal deficiency       Extraocular Movement      Right Left    Full Full       Neuro/Psych    Oriented x3: Yes   Mood/Affect: Normal       Dilation     Both eyes: 1.0% Mydriacyl, 2.5% Phenylephrine @ 8:15 AM        Slit Lamp and Fundus Exam    External Exam      Right Left   External Normal Normal       Slit Lamp Exam      Right Left   Lids/Lashes Normal Normal   Conjunctiva/Sclera White and quiet White and quiet   Cornea Clear Clear   Anterior Chamber Deep and quiet Deep and quiet   Iris Round and reactive Round and reactive   Lens Centered posterior chamber intraocular lens, Open posterior capsule Centered posterior chamber intraocular lens, Open posterior capsule   Anterior Vitreous Normal Normal       Fundus Exam      Right Left   Posterior Vitreous Posterior vitreous detachment Posterior vitreous detachment   Disc Normal Normal   C/D Ratio 0.2 0.2   Macula Microaneurysms, no macular thickening Normal   Vessels Old retinal vein occlusion in the right eye, old IT BRAO, good sector PRP, compensated no active disease Normal   Periphery Normal Normal          IMAGING AND PROCEDURES  Imaging and Procedures for 08/16/20  OCT, Retina - OU - Both Eyes       Right Eye Central Foveal Thickness: 279. Progression has been stable. Findings include no IRF, no SRF, normal foveal contour.   Left Eye Quality was good. Scan locations included subfoveal. Central Foveal Thickness: 269. Progression has been stable. Findings include retinal drusen , no IRF, normal foveal contour.   Notes Region inferotemporal of atrophy, no active CME, OD.  PVD OD, OS   Only minor thickening of Bruch's membrane, no overt drusenoid change, no complications of ARMD  ASSESSMENT/PLAN:  Central retinal vein occlusion of right eye Condition resolved from the past  Branch retinal artery occlusion of right eye Long inferotemporal arcade, prior NVE, treated in the past with local PRP, 08-14-2017  Right posterior capsular opacification Condition resolved improved  Vitreomacular adhesion of right eye Edition resolved  improved  Posterior vitreous detachment of both eyes   The nature of posterior vitreous detachment was discussed with the patient as well as its physiology, its age prevalence, and its possible implication regarding retinal breaks and detachment.  An informational brochure was given to the patient.  All the patient's questions were answered.  The patient was asked to return if new or different flashes or floaters develops.   Patient was instructed to contact office immediately if any changes were noticed. I explained to the patient that vitreous inside the eye is similar to jello inside a bowl. As the jello melts it can start to pull away from the bowl, similarly the vitreous throughout our lives can begin to pull away from the retina. That process is called a posterior vitreous detachment. In some cases, the vitreous can tug hard enough on the retina to form a retinal tear. I discussed with the patient the signs and symptoms of a retinal detachment.  Do not rub the eye.  Early stage nonexudative age-related macular degeneration of both eyes The nature of age--related macular degeneration was discussed with the patient as well as the distinction between dry and wet types. Checking an Amsler Grid daily with advice to return immediately should a distortion develop, was given to the patient. The patient 's smoking status now and in the past was determined and advice based on the AREDS study was provided regarding the consumption of antioxidant supplements. AREDS 2 vitamin formulation was recommended. Consumption of dark leafy vegetables and fresh fruits of various colors was recommended. Treatment modalities for wet macular degeneration particularly the use of intravitreal injections of anti-blood vessel growth factors was discussed with the patient. Avastin, Lucentis, and Eylea are the available options. On occasion, therapy includes the use of photodynamic therapy and thermal laser. Stressed to the patient do  not rub eyes.  Patient was advised to check Amsler Grid daily and return immediately if changes are noted. Instructions on using the grid were given to the patient. All patient questions were answered.  Not to intermediate ARMD thus does not qualify for recommendation for oral vitamin therapy      ICD-10-CM   1. Stable central retinal vein occlusion of right eye  H34.8112 OCT, Retina - OU - Both Eyes    Color Fundus Photography Optos - OU - Both Eyes  2. Posterior vitreous detachment of both eyes  H43.813 OCT, Retina - OU - Both Eyes    Color Fundus Photography Optos - OU - Both Eyes  3. Branch retinal artery occlusion of right eye  H34.231   4. Right posterior capsular opacification  H26.491   5. Vitreomacular adhesion of right eye  H43.821   6. Early stage nonexudative age-related macular degeneration of both eyes  H35.3131     1.  2.  3.  Ophthalmic Meds Ordered this visit:  No orders of the defined types were placed in this encounter.      Return in about 9 months (around 05/16/2021) for DILATE OU, COLOR FP, OCT.  There are no Patient Instructions on file for this visit.   Explained the diagnoses, plan, and follow up with the patient and they expressed understanding.  Patient expressed understanding of the importance of proper follow up care.   Clent Demark Sharmaine Bain M.D. Diseases & Surgery of the Retina and Vitreous [facility] [place] 08/16/20     Abbreviations: M myopia (nearsighted); A astigmatism; H hyperopia (farsighted); P presbyopia; Mrx spectacle prescription;  CTL contact lenses; OD right eye; OS left eye; OU both eyes  XT exotropia; ET esotropia; PEK punctate epithelial keratitis; PEE punctate epithelial erosions; DES dry eye syndrome; MGD meibomian gland dysfunction; ATs artificial tears; PFAT's preservative free artificial tears; [place] nuclear sclerotic cataract; PSC posterior subcapsular cataract; ERM epi-retinal membrane; PVD posterior vitreous  detachment; RD retinal detachment; DM diabetes mellitus; DR diabetic retinopathy; NPDR non-proliferative diabetic retinopathy; PDR proliferative diabetic retinopathy; CSME clinically significant macular edema; DME diabetic macular edema; dbh dot blot hemorrhages; CWS cotton wool spot; POAG primary open angle glaucoma; C/D cup-to-disc ratio; HVF humphrey visual field; GVF goldmann visual field; OCT optical coherence tomography; IOP intraocular pressure; BRVO Branch retinal vein occlusion; CRVO central retinal vein occlusion; CRAO central retinal artery occlusion; BRAO branch retinal artery occlusion; RT retinal tear; SB scleral buckle; PPV pars plana vitrectomy; VH Vitreous hemorrhage; PRP panretinal laser photocoagulation; IVK intravitreal kenalog; VMT vitreomacular traction; MH Macular hole;  NVD neovascularization of the disc; NVE neovascularization elsewhere; AREDS age related eye disease study; ARMD age related macular degeneration; POAG primary open angle glaucoma; EBMD epithelial/anterior basement membrane dystrophy; ACIOL anterior chamber intraocular lens; IOL intraocular lens; PCIOL posterior chamber intraocular lens; Phaco/IOL phacoemulsification with intraocular lens placement; [place] photorefractive keratectomy; LASIK laser assisted in situ keratomileusis; HTN hypertension; DM diabetes mellitus; COPD chronic obstructive pulmonary disease

## 2020-08-18 ENCOUNTER — Encounter (INDEPENDENT_AMBULATORY_CARE_PROVIDER_SITE_OTHER): Payer: Medicare Other | Admitting: Ophthalmology

## 2020-08-24 ENCOUNTER — Ambulatory Visit (INDEPENDENT_AMBULATORY_CARE_PROVIDER_SITE_OTHER): Payer: Medicare Other | Admitting: Podiatry

## 2020-08-24 ENCOUNTER — Encounter: Payer: Self-pay | Admitting: Podiatry

## 2020-08-24 ENCOUNTER — Other Ambulatory Visit: Payer: Self-pay

## 2020-08-24 DIAGNOSIS — M7671 Peroneal tendinitis, right leg: Secondary | ICD-10-CM

## 2020-08-24 DIAGNOSIS — M779 Enthesopathy, unspecified: Secondary | ICD-10-CM | POA: Diagnosis not present

## 2020-08-24 MED ORDER — TRIAMCINOLONE ACETONIDE 10 MG/ML IJ SUSP
10.0000 mg | Freq: Once | INTRAMUSCULAR | Status: AC
Start: 2020-08-24 — End: 2020-08-24
  Administered 2020-08-24: 10 mg

## 2020-08-29 NOTE — Progress Notes (Signed)
Subjective:   Patient ID: Claudia Howell, female   DOB: 80 y.o.   MRN: [id number]   HPI Patient presents stating she has developed discomfort in the right ankle states that it sore and that she is try to reduce her activity levels   ROS      Objective:  Physical Exam  Neurovascular status intact with inflammation of the sinus tarsi right fluid buildup with pain     Assessment:  Inflammatory capsulitis of the sinus tarsi right     Plan:  H&P reviewed condition sterile prep injected the sinus tarsi right 3 mg Kenalog 5 mg Xylocaine advised on anti-inflammatories reappoint to recheck

## 2020-09-23 ENCOUNTER — Telehealth: Payer: Self-pay | Admitting: *Deleted

## 2020-09-23 NOTE — Telephone Encounter (Signed)
   [facility] Pre-operative Risk Assessment    Patient Name: Claudia Howell  DOB: [birth year]  MRN: [id number]   HEARTCARE STAFF: - Please ensure there is not already an duplicate clearance open for this procedure. - Under Visit Info/Reason for Call, type in Other and utilize the format Clearance MM/DD/YY or Clearance TBD. Do not use dashes or single digits. - If request is for dental extraction, please clarify the # of teeth to be extracted.  Request for surgical clearance:  1. What type of surgery is being performed? COLONOSCOPY   2. When is this surgery scheduled? 11/16/20   3. What type of clearance is required (medical clearance vs. Pharmacy clearance to hold med vs. Both)? MEDICAL  4. Are there any medications that need to be held prior to surgery and how long? PLAVIX    5. Practice name and name of physician performing surgery? [facility]; DR. Michail Sermon   6. What is the office phone number? [phone]   7.   What is the office fax number? [phone]  8.   Anesthesia type (None, local, MAC, general) ? PROPOFOL   Claudia Howell 09/23/2020, 9:16 AM  _________________________________________________________________   (provider comments below)

## 2020-09-23 NOTE — Telephone Encounter (Signed)
   Primary Cardiologist: Sinclair Grooms, MD  Chart reviewed as part of pre-operative protocol coverage. Given past medical history and time since last visit, based on ACC/AHA guidelines, Ilda A Leccese would be at acceptable risk for the planned procedure without further cardiovascular testing.   Her Plavix may be held for 3-5 days prior to her procedure.  Please resume as soon as hemostasis is achieved at the discretion of the surgeon.  I will route this recommendation to the requesting party via Epic fax function and remove from pre-op pool.  Please call with questions.  Jossie Ng. Yohanna Tow NP-C    09/23/2020, 9:33 AM [place] [address] Office [phone] Fax [phone]

## 2020-10-20 ENCOUNTER — Ambulatory Visit (INDEPENDENT_AMBULATORY_CARE_PROVIDER_SITE_OTHER): Payer: Medicare Other | Admitting: Family Medicine

## 2020-10-20 ENCOUNTER — Other Ambulatory Visit: Payer: Self-pay

## 2020-10-20 ENCOUNTER — Encounter: Payer: Self-pay | Admitting: Family Medicine

## 2020-10-20 VITALS — BP 134/80 | HR 68 | Ht 65.0 in | Wt 135.0 lb

## 2020-10-20 DIAGNOSIS — I25708 Atherosclerosis of coronary artery bypass graft(s), unspecified, with other forms of angina pectoris: Secondary | ICD-10-CM | POA: Diagnosis not present

## 2020-10-20 DIAGNOSIS — M5441 Lumbago with sciatica, right side: Secondary | ICD-10-CM | POA: Diagnosis not present

## 2020-10-20 DIAGNOSIS — M62830 Muscle spasm of back: Secondary | ICD-10-CM | POA: Diagnosis not present

## 2020-10-20 MED ORDER — METHOCARBAMOL 500 MG PO TABS: 500.0000 mg | ORAL_TABLET | Freq: Three times a day (TID) | ORAL | 0 refills | Status: DC | PRN

## 2020-10-20 MED ORDER — PREDNISONE 10 MG (21) PO TBPK: ORAL_TABLET | ORAL | 0 refills | Status: DC

## 2020-10-20 NOTE — Progress Notes (Signed)
Chief Complaint  Patient presents with  . Sciatica    Right sided pain in her buttock area that is very concentrated and came on Monday very strong, does radiate down her leg. Said that her leg has given out on her a few times-has never had this before. Tues took IBU and did not help at all.   . Immunizations    Does not want #4 covid shot today.     At self-checkout at [employer] on 4/25, she had acute onset of pain when taking an item from the cart to scan. She had an intense pain in the R buttock which went down the R leg. Pain has persisted since then. Of note, she had taken (in error) 20mg  of Crestor (two of the 10mg  tablets) about 4 hours prior to the onset of pain.  She hadn't taken any in a few weeks (decided to restart after having been at the beach with change in diet).  She describes pain as sledgehammer in the buttock, constant, pressure/squeeze. Not sharp. The radiation into the leg varies in intensity,changes location, unsure if also related to her ankle pain/tendonitis.  She has had ongoing pain at R ankle (diagnosed and treated for tendonitis by Dr. Paulla Dolly, 2 injections)  Returned from [facility] Saturday. Monday she took ibuprofen, didn't help. She was able to sleep some. Tuesday she tried exercises for sciatica, one of which helped (knee to chest/chin) She took 6 ibuprofen over 8 hours, not much help. No sleep Tuesday due to pain. Wednesday (yesterday)--went to movie--hurt to walk to theater, no pain with sitting.  Also using a topical medication (something like Hot Arthritis, not icy hot, smelled like Ben Gay)--used at night, and found it helpful.  Using neighbor's cane, has helped  PMH, PSH, SH reviewed  Outpatient Encounter Medications as of 10/20/2020  Medication Sig Note  . atenolol (TENORMIN) 100 MG tablet Take 1 tablet (100 mg total) by mouth daily.   . benazepril (LOTENSIN) 40 MG tablet Take 1 tablet (40 mg total) by mouth daily.   . cholecalciferol (VITAMIN D)  1000 UNITS tablet Take 1,000 Units by mouth daily.   . clopidogrel (PLAVIX) 75 MG tablet Take 1 tablet (75 mg total) by mouth daily with breakfast.   . Coenzyme Q10 (COQ10 PO) Take 1 capsule by mouth daily.   Marland Kitchen GLUCOSAMINE-CHONDROITIN-VIT D3 PO Take 1 tablet by mouth 2 (two) times a week.    . hydrochlorothiazide (HYDRODIURIL) 25 MG tablet Take 1 tablet (25 mg total) by mouth daily.   . methocarbamol (ROBAXIN) 500 MG tablet Take 1-2 tablets (500-1,000 mg total) by mouth every 8 (eight) hours as needed for muscle spasms.   . Multiple Vitamin (MULTIVITAMIN) tablet Take 1 tablet by mouth daily.   . potassium chloride (K-DUR) 10 MEQ tablet Take 10 mEq by mouth every 3 (three) days.    . predniSONE (STERAPRED UNI-PAK 21 TAB) 10 MG (21) TBPK tablet Take as directed   . psyllium (METAMUCIL) 58.6 % packet Take 1 packet by mouth 2 (two) times daily.   . furosemide (LASIX) 20 MG tablet Take 1 tablet (20 mg total) by mouth as needed for fluid or edema. (Patient not taking: Reported on 10/20/2020)   . nitroGLYCERIN (NITROSTAT) 0.4 MG SL tablet Place 1 tablet (0.4 mg total) under the tongue every 5 (five) minutes as needed for chest pain. (Patient not taking: Reported on 10/20/2020)   . rosuvastatin (CRESTOR) 10 MG tablet Take 1 tablet (10 mg total) by mouth daily. (  Patient not taking: Reported on 10/20/2020) 10/20/2020: Hasn't taken in a few weeks (causing leg cramps)--took 2 tablets on Monday, none since   No facility-administered encounter medications on file as of 10/20/2020.   NOT taking prednisone or robaxin prior to today's visit.  Allergies  Allergen Reactions  . Contrast Media [Iodinated Diagnostic Agents] Hives  . Zetia [Ezetimibe] Other (See Comments)    Muscle cramps  . Oxycodone Nausea And Vomiting and Other (See Comments)    Extreme nausea and vomiting  . Repatha [Evolocumab] Other (See Comments)    Muscle cramps   ROS: no fever, chills, URI symptoms, headache, dizziness, chest pain,  shortness of breath.  +low back pain, with radiation to the RLE. No numbness, tingling  Some giving way of leg related to pain. No dysuria, incontinence. No bleeding, bruising, rash or other concerns.   PHYSICAL EXAM:  BP 134/80   Pulse 68   Ht 5\' 5"  (1.651 m)   Wt 135 lb (61.2 kg)   BMI 22.47 kg/m   Well-appearing female, in moderate discomfort with certain movements, appears comfortable when seated in chair in exam room. HEENT: conjunctiva and sclera are clear, EOMI, wearing mask Neck: no lymphadenopathy or mass Heart: regular rate and rhythm Lungs: clear Back: Tender at the lower lumbar spine centrally, as well as R lower paraspinous muscle and tender at R SI joint. Tender into the buttock muscles as well. Touching on lower spine caused radiation of the pain into the buttock. Decreased range of motion of pyriformis with pain (figure of 4--unable to get knee down much at all due to pain). Neuro: alert and oriented.  Normal strength in LE's, normal sensation, DTR's symmetric.  Negative SLR.   Extremities: Feet appear different--toes are much flatter on the R, with slightly decreased range of motion. No edema Psych: normal mood, affect, hygiene and grooming   ASSESSMENT/PLAN:  Acute right-sided low back pain with right-sided sciatica - pt on Plavix. Prednisone taper--risks/SE reviewed.  Heat, shown stretches, massage. Muscle relaxants prn. Consider PT if not improving - Plan: DG Lumbar Spine Complete, predniSONE (STERAPRED UNI-PAK 21 TAB) 10 MG (21) TBPK tablet, methocarbamol (ROBAXIN) 500 MG tablet  Muscle spasm of back - Plan: methocarbamol (ROBAXIN) 500 MG tablet   I spent 35 minutes dedicated to the care of this patient, including pre-visit review of records, face to face time, post-visit ordering of testing and documentation.  XR [place] [facility]  Take the steroid course as directed. Take the muscle relaxant as needed for pain and muscle spasm (buttock pain) Use heat at least 3  times/day, and do some stretches and massage (if tolerated) after the heat.  We are prescribing methocarbamol which is a muscle relaxant that shouldn't be very sedating.  If you find that you need something more sedating to take at bedtime, let me know and we can send in a prescription for flexeril--this is sedating and should ONLY be taken at bedtime.

## 2020-10-20 NOTE — Patient Instructions (Addendum)
Take the steroid course as directed. Take the muscle relaxant as needed for pain and muscle spasm (buttock pain) Use heat at least 3 times/day, and do some stretches and massage (if tolerated) after the heat.  We are prescribing methocarbamol which is a muscle relaxant that shouldn't be very sedating.  If you find that you need something more sedating to take at bedtime, let me know and we can send in a prescription for flexeril--this is sedating and should ONLY be taken at bedtime.  Go to [facility] for x-rays.  [address] [place]   Acute Back Pain, Adult Acute back pain is sudden and usually short-lived. It is often caused by an injury to the muscles and tissues in the back. The injury may result from:  A muscle or ligament getting overstretched or torn (strained). Ligaments are tissues that connect bones to each other. Lifting something improperly can cause a back strain.  Wear and tear (degeneration) of the spinal disks. Spinal disks are circular tissue that provide cushioning between the bones of the spine (vertebrae).  Twisting motions, such as while playing sports or doing yard work.  A hit to the back.  Arthritis. You may have a physical exam, lab tests, and imaging tests to find the cause of your pain. Acute back pain usually goes away with rest and home care. Follow these instructions at home: Managing pain, stiffness, and swelling  Treatment may include medicines for pain and inflammation that are taken by mouth or applied to the skin, prescription pain medicine, or muscle relaxants. Take over-the-counter and prescription medicines only as told by your health care provider.  Your health care provider may recommend applying ice during the first 24-48 hours after your pain starts. To do this: ? Put ice in a plastic bag. ? Place a towel between your skin and the bag. ? Leave the ice on for 20 minutes, 2-3 times a day.  If directed, apply heat to the affected area as  often as told by your health care provider. Use the heat source that your health care provider recommends, such as a moist heat pack or a heating pad. ? Place a towel between your skin and the heat source. ? Leave the heat on for 20-30 minutes. ? Remove the heat if your skin turns bright red. This is especially important if you are unable to feel pain, heat, or cold. You have a greater risk of getting burned. Activity  Do not stay in bed. Staying in bed for more than 1-2 days can delay your recovery.  Sit up and stand up straight. Avoid leaning forward when you sit or hunching over when you stand. ? If you work at a desk, sit close to it so you do not need to lean over. Keep your chin tucked in. Keep your neck drawn back, and keep your elbows bent at a 90-degree angle (right angle). ? Sit high and close to the steering wheel when you drive. Add lower back (lumbar) support to your car seat, if needed.  Take short walks on even surfaces as soon as you are able. Try to increase the length of time you walk each day.  Do not sit, drive, or stand in one place for more than 30 minutes at a time. Sitting or standing for long periods of time can put stress on your back.  Do not drive or use heavy machinery while taking prescription pain medicine.  Use proper lifting techniques. When you bend and lift,  use positions that put less stress on your back: ? [place] your knees. ? Keep the load close to your body. ? Avoid twisting.  Exercise regularly as told by your health care provider. Exercising helps your back heal faster and helps prevent back injuries by keeping muscles strong and flexible.  Work with a physical therapist to make a safe exercise program, as recommended by your health care provider. Do any exercises as told by your physical therapist.   Lifestyle  Maintain a healthy weight. Extra weight puts stress on your back and makes it difficult to have good posture.  Avoid activities or  situations that make you feel anxious or stressed. Stress and anxiety increase muscle tension and can make back pain worse. Learn ways to manage anxiety and stress, such as through exercise. General instructions  Sleep on a firm mattress in a comfortable position. Try lying on your side with your knees slightly bent. If you lie on your back, put a pillow under your knees.  Follow your treatment plan as told by your health care provider. This may include: ? Cognitive or behavioral therapy. ? Acupuncture or massage therapy. ? Meditation or yoga. Contact a health care provider if:  You have pain that is not relieved with rest or medicine.  You have increasing pain going down into your legs or buttocks.  Your pain does not improve after 2 weeks.  You have pain at night.  You lose weight without trying.  You have a fever or chills. Get help right away if:  You develop new bowel or bladder control problems.  You have unusual weakness or numbness in your arms or legs.  You develop nausea or vomiting.  You develop abdominal pain.  You feel faint. Summary  Acute back pain is sudden and usually short-lived.  Use proper lifting techniques. When you bend and lift, use positions that put less stress on your back.  Take over-the-counter and prescription medicines and apply heat or ice as directed by your health care provider. This information is not intended to replace advice given to you by your health care provider. Make sure you discuss any questions you have with your health care provider. Document Revised: 03/04/2020 Document Reviewed: 03/04/2020 Elsevier Patient Education  2021 [employer].

## 2020-10-21 ENCOUNTER — Ambulatory Visit
Admission: RE | Admit: 2020-10-21 | Discharge: 2020-10-21 | Disposition: A | Discharge status: Home / Self Care | Payer: Medicare Other | Source: Ambulatory Visit | Attending: Family Medicine | Admitting: Family Medicine

## 2020-10-21 DIAGNOSIS — M545 Low back pain, unspecified: Secondary | ICD-10-CM | POA: Diagnosis not present

## 2020-10-21 DIAGNOSIS — M5441 Lumbago with sciatica, right side: Secondary | ICD-10-CM

## 2020-10-24 ENCOUNTER — Encounter: Payer: Self-pay | Admitting: Family Medicine

## 2020-10-24 DIAGNOSIS — I700 Atherosclerosis of aorta: Secondary | ICD-10-CM | POA: Insufficient documentation

## 2020-10-25 DIAGNOSIS — E7849 Other hyperlipidemia: Secondary | ICD-10-CM

## 2020-10-25 MED ORDER — ATORVASTATIN CALCIUM 10 MG PO TABS: 10.0000 mg | ORAL_TABLET | Freq: Every day | ORAL | 3 refills | Status: DC

## 2020-10-27 ENCOUNTER — Encounter: Payer: Self-pay | Admitting: Family Medicine

## 2020-10-27 DIAGNOSIS — M62830 Muscle spasm of back: Secondary | ICD-10-CM

## 2020-10-27 MED ORDER — CYCLOBENZAPRINE HCL 10 MG PO TABS
5.0000 mg | ORAL_TABLET | Freq: Every evening | ORAL | 0 refills | Status: DC | PRN
Start: 2020-10-27 — End: 2021-05-11

## 2020-10-28 ENCOUNTER — Telehealth: Payer: Self-pay

## 2020-10-28 ENCOUNTER — Encounter: Payer: Self-pay | Admitting: Family Medicine

## 2020-10-28 NOTE — Telephone Encounter (Signed)
P.A. CYCLOBENZAPRINE  °

## 2020-11-04 NOTE — Telephone Encounter (Signed)
P.A. approved til 10/28/21

## 2020-11-15 ENCOUNTER — Encounter: Payer: Self-pay | Admitting: Family Medicine

## 2020-11-17 ENCOUNTER — Other Ambulatory Visit: Payer: Self-pay

## 2020-11-17 ENCOUNTER — Ambulatory Visit
Admission: RE | Admit: 2020-11-17 | Discharge: 2020-11-17 | Disposition: A | Discharge status: Home / Self Care | Payer: Medicare Other | Source: Ambulatory Visit | Attending: Family Medicine | Admitting: Family Medicine

## 2020-11-17 DIAGNOSIS — M8580 Other specified disorders of bone density and structure, unspecified site: Secondary | ICD-10-CM

## 2020-11-17 DIAGNOSIS — Z1231 Encounter for screening mammogram for malignant neoplasm of breast: Secondary | ICD-10-CM | POA: Diagnosis not present

## 2020-11-17 DIAGNOSIS — Z780 Asymptomatic menopausal state: Secondary | ICD-10-CM | POA: Diagnosis not present

## 2020-11-17 DIAGNOSIS — M85852 Other specified disorders of bone density and structure, left thigh: Secondary | ICD-10-CM | POA: Diagnosis not present

## 2020-12-07 ENCOUNTER — Other Ambulatory Visit: Payer: Medicare Other

## 2021-01-05 ENCOUNTER — Other Ambulatory Visit: Payer: Self-pay

## 2021-01-05 ENCOUNTER — Other Ambulatory Visit: Payer: Medicare Other

## 2021-01-05 DIAGNOSIS — E7849 Other hyperlipidemia: Secondary | ICD-10-CM | POA: Diagnosis not present

## 2021-01-05 LAB — LIPID PANEL
Chol/HDL Ratio: 3.1 ratio (ref 0.0–4.4)
Cholesterol, Total: 151 mg/dL (ref 100–199)
HDL: 48 mg/dL (ref >39)
LDL Chol Calc (NIH): 77 mg/dL (ref 0–99)
Triglycerides: 149 mg/dL (ref 0–149)
VLDL Cholesterol Cal: 26 mg/dL (ref 5–40)

## 2021-01-05 LAB — HEPATIC FUNCTION PANEL
ALT: 15 IU/L (ref 0–32)
AST: 22 IU/L (ref 0–40)
Albumin: 4.4 g/dL (ref 3.7–4.7)
Alkaline Phosphatase: 50 IU/L (ref 44–121)
Bilirubin Total: 0.6 mg/dL (ref 0.0–1.2)
Bilirubin, Direct: 0.17 mg/dL (ref 0.00–0.40)
Total Protein: 6.8 g/dL (ref 6.0–8.5)

## 2021-03-30 ENCOUNTER — Encounter: Payer: Self-pay | Admitting: Family Medicine

## 2021-04-05 ENCOUNTER — Other Ambulatory Visit: Payer: Self-pay

## 2021-04-05 ENCOUNTER — Other Ambulatory Visit (INDEPENDENT_AMBULATORY_CARE_PROVIDER_SITE_OTHER): Payer: Medicare Other

## 2021-04-05 DIAGNOSIS — Z23 Encounter for immunization: Secondary | ICD-10-CM

## 2021-04-19 ENCOUNTER — Other Ambulatory Visit (INDEPENDENT_AMBULATORY_CARE_PROVIDER_SITE_OTHER): Payer: Medicare Other

## 2021-04-19 ENCOUNTER — Other Ambulatory Visit: Payer: Self-pay

## 2021-04-19 DIAGNOSIS — Z23 Encounter for immunization: Secondary | ICD-10-CM

## 2021-05-10 NOTE — Progress Notes (Signed)
Chief Complaint  Patient presents with   Medicare Wellness    Fasting (blood in lab) AWV/CPE with pelvic. No new concerns. Has not ever heard anything from [facility].     Claudia Howell is a 80 y.o. female who presents for annual wellness visit and follow-up on chronic medical conditions.   She has the following concerns: Her hair is falling out.  Asking about minoxidil.  She notes hair loss for about a year, getting worse, thinning.   She was last seen in late 09/2020 with R buttock pain and sciatica.  She was referred for PT--couldn't get in for 10d so instead went to a chiropractor (Dr. Clide Deutscher).  She went 4x/week at the beginning, took 3 months before she was better. Now sees him once a month for maintenance.  Last year, had R ankle swelling and pain x 4 months. She saw Dr. 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 She was treated with injections twice, only short-lived benefit.  Overall she is doing okay with this, doing some home exercises.  Constipation: This has been controlled with using a capful of Miralax daily, taking Senna every night (has done this for over 50 years), and eating more vegetables.  She is moving her bowels every morning.    H/o adenomatous colon polyps: Colonoscopy in 10/2014 had tubular adenomas, rec 3 year repeat. This was delayed due to MI and being on aspirin and Plavix.  She went to have the colonoscopy in August, but vomited the prep the morning of the procedure. It was cancelled, and hasn't heard back to reschedule it. She doesn't want to go back to [facility], prefers [facility].  CAD:  Patient is under the care of Dr. Tamala Julian, last seen in 07/2020, and is scheduled for 08/2021. She has history of CAD with bypass graft (January/2014), saphenous vein graft stent (10/2012), and unstable angina treated 04/28/2019 with R coronary artery CTO PCI.  She also has hypertension, h/o acute on chronic systolic and diastolic heart failure, history of  paroxysmal atrial fibrillation, s/p Maze procedure. Denies any recurrent atrial fibrillation. Denies chest pain, palpitations, tachycardia, shortness of breath, edema.  Hypertension:  Blood pressures have been running 130's/60's.  She is compliant with atenolol, benazepril and HCTZ. She takes OTC potassium supplement 3x/week.  She denies any leg cramps (as long as she has some tonic at bedtime). Hasn't had cramps in months, and uses mustard prn.   Hyperlipidemia:  intolerance to zetia and limited tolerance to statins.  She is currently on atorvastatin (switched from rosuvastatin 83m, unsure of when--she thinks the most recent lipids were after the change). She is taking Coenzyme Q10 daily.  She is tolerating the atovastatin better than she did the crestor. She tried Repatha x 3 doses through the lipid clinic, but didn't tolerate due to cold symptoms. Last lipids were in July, a little above goal (goal LDL <70), improved from 04/2020 (when >100).  Lab Results  Component Value Date   CHOL 151 01/05/2021   HDL 48 01/05/2021   LDLCALC 77 01/05/2021   TRIG 149 01/05/2021   CHOLHDL 3.1 01/05/2021    Immunization History  Administered Date(s) Administered   Fluad Quad(high Dose 65+) 03/25/2019, 03/22/2020, 04/05/2021   Influenza Split 05/07/2012   Influenza, High Dose Seasonal PF 04/22/2013, 03/25/2014, 04/08/2015, 04/13/2016, 03/27/2017, 05/08/2018   Influenza-Unspecified 04/12/2011   PFIZER(Purple Top)SARS-COV-2 Vaccination 07/08/2019, 07/29/2019, 04/06/2020   Pfizer Covid-19 Vaccine Bivalent Booster 180yr& up 04/19/2021   Pneumococcal Conjugate-13 09/09/2014  Pneumococcal Polysaccharide-23 06/30/2009, 05/09/2020   Td 06/25/2002   Tdap 01/23/2009, 07/28/2020   Zoster Recombinat (Shingrix) 03/14/2017, 05/24/2017   Zoster, Live 01/24/2008   Last Pap smear: n/a (hysterectomy for benign reasons) Last mammogram: 10/2020 Last colonoscopy: 10/2014--multiple tubular adenomas; repeat was due  10/2017 (48yr, delayed due to cardiac meds (plavix/ASA). Didn't tolerate prep when tried in 01/2021. Hasn't rescheduled yet. Last DEXA: 10[facility]  Prior DEXA was in 09/2011 (T-1.1 L fem neck)--[facility] Exercise:  Tai chi class 6x/week. Does some walking, no weights. Dentist: twice yearly Ophtho: sees retinal specialist every 8 months.   Patient Care Team: KRita Ohara MD as PCP - General (Family Medicine) SBelva Crome MD as PCP - Cardiology (Cardiology) Ophtho: Dr. KPeter GarterRetinal specialist: Dr. RZadie RhineDentist: Dr. NRaelyn EnsignGI:  Dr. SMichail SermonDerm: Dr. DJarome MatinPodiatrist: Dr. RPaulla Dolly Depression Screening: Flowsheet Row [place] [facility] Total Score 0        Falls screen:  Fall Risk  05/11/2021 10/20/2020 05/09/2020 05/26/2019 09/24/2018  Falls in the past year? 0 0 0 0 0  Comment - - - - -  Number falls in past yr: 0 0 - 0 -  Injury with Fall? 0 0 - 0 -  Risk for fall due to : - - - Other (Comment) -  Risk for fall due to: Comment - - - Single Leg Stand Test: 2.18 sec. -  Follow up - Falls evaluation completed - Falls evaluation completed -     Functional Status Survey: Is the patient deaf or have difficulty hearing?: Yes (wears B/L hearing aides) Does the patient have difficulty seeing, even when wearing glasses/contacts?: No Does the patient have difficulty concentrating, remembering, or making decisions?: Yes (short term, age related) Does the patient have difficulty walking or climbing stairs?: No Does the patient have difficulty dressing or bathing?: No Does the patient have difficulty doing errands alone such as visiting a doctor's office or shopping?: No Forgets some names, nothing more serious  Mini-Cog Scoring: 5     End of Life Discussion:  Patient has a living will and medical power of attorney   PMH, [place] FH were reviewed and updated  Outpatient Encounter Medications as of  05/11/2021  Medication Sig Note   atenolol (TENORMIN) 100 MG tablet Take 1 tablet (100 mg total) by mouth daily.    atorvastatin (LIPITOR) 10 MG tablet Take 1 tablet (10 mg total) by mouth daily.    benazepril (LOTENSIN) 40 MG tablet Take 1 tablet (40 mg total) by mouth daily.    cholecalciferol (VITAMIN D) 1000 UNITS tablet Take 1,000 Units by mouth daily.    clopidogrel (PLAVIX) 75 MG tablet Take 1 tablet (75 mg total) by mouth daily with breakfast.    Coenzyme Q10 (COQ10 PO) Take 1 capsule by mouth daily.    GLUCOSAMINE-CHONDROITIN-VIT D3 PO Take 1 tablet by mouth every other day.    hydrochlorothiazide (HYDRODIURIL) 25 MG tablet Take 1 tablet (25 mg total) by mouth daily.    Multiple Vitamin (MULTIVITAMIN) tablet Take 1 tablet by mouth daily.    potassium chloride (K-DUR) 10 MEQ tablet Take 10 mEq by mouth every 3 (three) days.  05/11/2021: Taking OTC potassium, not this rx, takes 3x/week   furosemide (LASIX) 20 MG tablet Take 1 tablet (20 mg total) by mouth as needed for fluid or edema. (Patient not taking: Reported on 10/20/2020) 05/11/2021: Maybe once every  4 months   nitroGLYCERIN (NITROSTAT) 0.4 MG SL tablet Place 1 tablet (0.4 mg total) under the tongue every 5 (five) minutes as needed for chest pain. (Patient not taking: Reported on 10/20/2020)    [DISCONTINUED] cyclobenzaprine (FLEXERIL) 10 MG tablet Take 0.5-1 tablets (5-10 mg total) by mouth at bedtime as needed for muscle spasms.    [DISCONTINUED] methocarbamol (ROBAXIN) 500 MG tablet Take 1-2 tablets (500-1,000 mg total) by mouth every 8 (eight) hours as needed for muscle spasms.    [DISCONTINUED] predniSONE (STERAPRED UNI-PAK 21 TAB) 10 MG (21) TBPK tablet Take as directed    [DISCONTINUED] psyllium (METAMUCIL) 58.6 % packet Take 1 packet by mouth 2 (two) times daily.    No facility-administered encounter medications on file as of 05/11/2021.   Also takes Senna every day (didn't want this put in her chart).  Allergies  Allergen  Reactions   Contrast Media [Iodinated Diagnostic Agents] Hives   Zetia [Ezetimibe] Other (See Comments)    Muscle cramps   Oxycodone Nausea And Vomiting and Other (See Comments)    Extreme nausea and vomiting   Repatha [Evolocumab] Other (See Comments)    Muscle cramps   ROS: The patient denies anorexia, fever, headaches, ear pain, sore throat, breast concerns, palpitations, dizziness, syncope, dyspnea on exertion, cough, swelling, nausea, vomiting, diarrhea, constipation, abdominal pain, melena, hematochezia, indigestion/heartburn, hematuria, incontinence, dysuria, vaginal bleeding, discharge, odor or itch, genital lesions, joint pains, weakness, tremor, suspicious skin lesions, depression, anxiety, abnormal bleeding/bruising, or enlarged lymph nodes. Constipation is controlled per HPI Denies heartburn, indigestion. Leg cramps at night have resolved (on OTC K+, drinks tonic at bedtime) R buttock pain/sciatica resolved. Still has some R ankle discomfort   PHYSICAL EXAM:  BP 136/60   Pulse 80   Ht '5\' 5"'  (1.651 m)   Wt 134 lb (60.8 kg)   BMI 22.30 kg/m     Wt Readings from Last 3 Encounters:  05/11/21 134 lb (60.8 kg)  10/20/20 135 lb (61.2 kg)  07/26/20 137 lb 3.2 oz (62.2 kg)    General Appearance:   Alert, cooperative, no distress, appears stated age    Head:   Normocephalic, without obvious abnormality, atraumatic    Eyes:   PERRL, conjunctiva/corneas clear, EOM's intact, fundi benign    Ears:   Normal TM's and external ear canals    Nose:   Not examined, wearing mask due to COVID-19 pandemic   Throat:   Not examined, wearing mask due to COVID-19 pandemic   Neck:   Supple, no lymphadenopathy; thyroid: no enlargement/tenderness/ nodules; no carotid bruit appreciable  Back:   Spine nontender, no curvature, ROM normal, no CVA tenderness    Lungs:   Clear to auscultation bilaterally without wheezes, rales or ronchi; respirations unlabored    Chest Wall:   No tenderness or  deformity. WHSS  Heart:   Regular rate and rhythm, S1 and S2 normal, no murmur, rub or gallop    Breast Exam:   No tenderness, masses, or nipple discharge or inversion. No axillary lymphadenopathy.  WHSS left breast at superior aspect of areola. There is a palpable defect at 12 o'clock where tissue was removed.  No masses.  Abdomen:   Soft, non-tender, nondistended, normoactive bowel sounds, no masses, no hepatosplenomegaly. Small umbilical hernia, nontender, easily reducible.  Genitalia:   Normal external genitalia without lesions; +atrophic changes. BUS and vagina normal; normal bimanual exam without masses. Uterus is surgically absent.  Rectal:   Tight sphincter tone; even with pt bearing down,  I wasn't able to pass through, unable to assess for any masses. Did get small amount of soft brown stool, heme negative.  Extremities:   No clubbing, cyanosis or edema.  There is a 4x 3 cm soft tissue mass/focal area of swelling at R lateral malleolus. There is no erythema, warmth, nontender. Unchanged from last year.  Pulses:   2+ and symmetric all extremities    Skin:   Skin color, texture, turgor normal, no rashes or lesions. some diffuse thinning noted in scalp, no rash.  Lymph nodes:   Cervical, supraclavicular, and axillary nodes normal    Neurologic:   Normal strength, sensation and gait; reflexes 2+ and symmetric throughout                         Psych:   Normal mood, affect, hygiene and grooming   ASSESSMENT/PLAN:  Medicare annual wellness visit, subsequent  Osteopenia, unspecified location - discussed Ca, D, weight-bearing exercise  Constipation, unspecified constipation type - Cont high fiber diet, miralax daily.  Unlikely to have her stop the Senna  Chronic diastolic heart failure (HCC) - no e/o fluid overload or any symptoms. CPM  Pure hypercholesterolemia - cont statin; is tolerating atorvastatin better, and is closer to goal. Cont low chol diet  Essential hypertension, benign -  controlled - Plan: Comprehensive metabolic panel  Aortic atherosclerosis (HCC) - cont statin  Coronary artery disease involving autologous vein coronary bypass graft without angina pectoris - Doing well, asymptomatic. Cont current meds per cardiology  Adenomatous polyp of colon, unspecified part of colon - past due for recheck, recent unsuccessful prep with [facility], prefers to go to [facility] for next try. - Plan: Ambulatory referral to Gastroenterology  Hair loss - check thyroid; okay to use topical Rogaine for Women - Plan: TSH  Medication monitoring encounter - Plan: Comprehensive metabolic panel, CBC with Differential/Platelet   Discussed monthly self breast exams and yearly mammograms; at least 30 minutes of aerobic activity at least 5 days/week and weight-bearing exercise 2x/week; proper sunscreen use reviewed; healthy diet, including goals of calcium and vitamin D intake and alcohol recommendations (less than or equal to 1 drink/day) reviewed; regular seatbelt use; changing batteries in smoke detectors, use of carbon monoxide detectors.  Immunization recommendations discussed--UTD; continue yearly high dose flu shots.  Colonoscopy recommendations reviewed, past due for colonoscopy. Pt hadn't heard back from [facility] after cancelling colonoscopy due to not tolerating prep.  She prefers to go elsewhere.  MOST form reviewed and unchanged She is Full Code, Full Care   Medicare Attestation I have personally reviewed: The patient's medical and social history Their use of alcohol, tobacco or illicit drugs Their current medications and supplements The patient's functional ability including ADLs,fall risks, home safety risks, cognitive, and hearing and visual impairment Diet and physical activities Evidence for depression or mood disorders  The patient's weight, height, BMI have been recorded in the chart.  I have made referrals, counseling, and provided education to the patient based on  review of the above and I have provided the patient with a written personalized care plan for preventive services.     Vikki Ports, MD

## 2021-05-10 NOTE — Patient Instructions (Addendum)
  HEALTH MAINTENANCE RECOMMENDATIONS:  It is recommended that you get at least 30 minutes of aerobic exercise at least 5 days/week (for weight loss, you may need as much as 60-90 minutes). This can be any activity that gets your heart rate up. This can be divided in 10-15 minute intervals if needed, but try and build up your endurance at least once a week.  Weight bearing exercise is also recommended twice weekly.  Eat a healthy diet with lots of vegetables, fruits and fiber.  "Colorful" foods have a lot of vitamins (ie green vegetables, tomatoes, red peppers, etc).  Limit sweet tea, regular sodas and alcoholic beverages, all of which has a lot of calories and sugar.  Up to 1 alcoholic drink daily may be beneficial for women (unless trying to lose weight, watch sugars).  Drink a lot of water.  Calcium recommendations are 1200-1500 mg daily (1500 mg for postmenopausal women or women without ovaries), and vitamin D 1000 IU daily.  This should be obtained from diet and/or supplements (vitamins), and calcium should not be taken all at once, but in divided doses.  Monthly self breast exams and yearly mammograms for women over the age of 48 is recommended.  Sunscreen of at least SPF 30 should be used on all sun-exposed parts of the skin when outside between the hours of 10 am and 4 pm (not just when at beach or pool, but even with exercise, golf, tennis, and yard work!)  Use a sunscreen that says "broad spectrum" so it covers both UVA and UVB rays, and make sure to reapply every 1-2 hours.  Remember to change the batteries in your smoke detectors when changing your clock times in the spring and fall. Carbon monoxide detectors are recommended for your home.  Use your seat belt every time you are in a car, and please drive safely and not be distracted with cell phones and texting while driving.   Claudia Howell , Thank you for taking time to come for your Medicare Wellness Visit. I appreciate your ongoing  commitment to your health goals. Please review the following plan we discussed and let me know if I can assist you in the future.   This is a list of the screening recommended for you and due dates:  Health Maintenance  Topic Date Due   Colon Cancer Screening  10/28/2017   Tetanus Vaccine  07/28/2030   Pneumonia Vaccine  Completed   Flu Shot  Completed   DEXA scan (bone density measurement)  Completed   COVID-19 Vaccine  Completed   Hepatitis C Screening: USPSTF Recommendation to screen - Ages 29-79 yo.  Completed   Zoster (Shingles) Vaccine  Completed   HPV Vaccine  Aged Out   We are referring you to [facility] for your colonoscopy that is past due.  Let them know which prep you didn't tolerate so they can hopefully find one that you will tolerate.

## 2021-05-11 ENCOUNTER — Encounter: Payer: Self-pay | Admitting: Family Medicine

## 2021-05-11 ENCOUNTER — Ambulatory Visit (INDEPENDENT_AMBULATORY_CARE_PROVIDER_SITE_OTHER): Payer: Medicare Other | Admitting: Family Medicine

## 2021-05-11 ENCOUNTER — Other Ambulatory Visit: Payer: Self-pay

## 2021-05-11 VITALS — BP 136/60 | HR 80 | Ht 65.0 in | Wt 134.0 lb

## 2021-05-11 DIAGNOSIS — I10 Essential (primary) hypertension: Secondary | ICD-10-CM

## 2021-05-11 DIAGNOSIS — E7800 Pure hypercholesterolemia, unspecified: Secondary | ICD-10-CM

## 2021-05-11 DIAGNOSIS — L659 Nonscarring hair loss, unspecified: Secondary | ICD-10-CM | POA: Diagnosis not present

## 2021-05-11 DIAGNOSIS — Z5181 Encounter for therapeutic drug level monitoring: Secondary | ICD-10-CM | POA: Diagnosis not present

## 2021-05-11 DIAGNOSIS — M8580 Other specified disorders of bone density and structure, unspecified site: Secondary | ICD-10-CM

## 2021-05-11 DIAGNOSIS — K5900 Constipation, unspecified: Secondary | ICD-10-CM | POA: Diagnosis not present

## 2021-05-11 DIAGNOSIS — Z0000 Encounter for general adult medical examination without abnormal findings: Secondary | ICD-10-CM

## 2021-05-11 DIAGNOSIS — I5032 Chronic diastolic (congestive) heart failure: Secondary | ICD-10-CM

## 2021-05-11 DIAGNOSIS — I700 Atherosclerosis of aorta: Secondary | ICD-10-CM | POA: Diagnosis not present

## 2021-05-11 DIAGNOSIS — D126 Benign neoplasm of colon, unspecified: Secondary | ICD-10-CM | POA: Diagnosis not present

## 2021-05-11 DIAGNOSIS — I25810 Atherosclerosis of coronary artery bypass graft(s) without angina pectoris: Secondary | ICD-10-CM | POA: Diagnosis not present

## 2021-05-12 LAB — CBC WITH DIFFERENTIAL/PLATELET
Basophils Absolute: 0.1 10*3/uL (ref 0.0–0.2)
Basos: 1 %
EOS (ABSOLUTE): 0.5 10*3/uL — ABNORMAL HIGH (ref 0.0–0.4)
Eos: 6 %
Hematocrit: 40.3 % (ref 34.0–46.6)
Hemoglobin: 13.4 g/dL (ref 11.1–15.9)
Immature Grans (Abs): 0 10*3/uL (ref 0.0–0.1)
Immature Granulocytes: 0 %
Lymphocytes Absolute: 1.4 10*3/uL (ref 0.7–3.1)
Lymphs: 17 %
MCH: 30.9 pg (ref 26.6–33.0)
MCHC: 33.3 g/dL (ref 31.5–35.7)
MCV: 93 fL (ref 79–97)
Monocytes Absolute: 0.9 10*3/uL (ref 0.1–0.9)
Monocytes: 11 %
Neutrophils Absolute: 5.6 10*3/uL (ref 1.4–7.0)
Neutrophils: 65 %
Platelets: 207 10*3/uL (ref 150–450)
RBC: 4.33 x10E6/uL (ref 3.77–5.28)
RDW: 12.1 % (ref 11.7–15.4)
WBC: 8.5 10*3/uL (ref 3.4–10.8)

## 2021-05-12 LAB — COMPREHENSIVE METABOLIC PANEL
ALT: 14 IU/L (ref 0–32)
AST: 18 IU/L (ref 0–40)
Albumin/Globulin Ratio: 1.9 (ref 1.2–2.2)
Albumin: 4.6 g/dL (ref 3.7–4.7)
Alkaline Phosphatase: 67 IU/L (ref 44–121)
BUN/Creatinine Ratio: 22 (ref 12–28)
BUN: 19 mg/dL (ref 8–27)
Bilirubin Total: 0.3 mg/dL (ref 0.0–1.2)
CO2: 24 mmol/L (ref 20–29)
Calcium: 9.3 mg/dL (ref 8.7–10.3)
Chloride: 102 mmol/L (ref 96–106)
Creatinine, Ser: 0.85 mg/dL (ref 0.57–1.00)
Globulin, Total: 2.4 g/dL (ref 1.5–4.5)
Glucose: 94 mg/dL (ref 70–99)
Potassium: 3.7 mmol/L (ref 3.5–5.2)
Sodium: 140 mmol/L (ref 134–144)
Total Protein: 7 g/dL (ref 6.0–8.5)
eGFR: 70 mL/min/{1.73_m2} (ref >59)

## 2021-05-12 LAB — TSH: TSH: 3.19 u[IU]/mL (ref 0.450–4.500)

## 2021-05-16 ENCOUNTER — Other Ambulatory Visit: Payer: Self-pay

## 2021-05-16 ENCOUNTER — Ambulatory Visit (INDEPENDENT_AMBULATORY_CARE_PROVIDER_SITE_OTHER): Payer: Medicare Other | Admitting: Ophthalmology

## 2021-05-16 ENCOUNTER — Encounter (INDEPENDENT_AMBULATORY_CARE_PROVIDER_SITE_OTHER): Payer: Self-pay | Admitting: Ophthalmology

## 2021-05-16 DIAGNOSIS — H34231 Retinal artery branch occlusion, right eye: Secondary | ICD-10-CM

## 2021-05-16 DIAGNOSIS — H353131 Nonexudative age-related macular degeneration, bilateral, early dry stage: Secondary | ICD-10-CM | POA: Diagnosis not present

## 2021-05-16 DIAGNOSIS — H348112 Central retinal vein occlusion, right eye, stable: Secondary | ICD-10-CM

## 2021-05-16 DIAGNOSIS — I25810 Atherosclerosis of coronary artery bypass graft(s) without angina pectoris: Secondary | ICD-10-CM | POA: Diagnosis not present

## 2021-05-16 NOTE — Assessment & Plan Note (Signed)
Inferotemporal region of retinal nonperfusion well treated sector PRP no neovascular complications

## 2021-05-16 NOTE — Assessment & Plan Note (Signed)
Stable without macular edema,

## 2021-05-16 NOTE — Progress Notes (Signed)
05/16/2021     CHIEF COMPLAINT Patient presents for  Chief Complaint  Patient presents with   Retina Follow Up      HISTORY OF PRESENT ILLNESS: Claudia Howell is a 80 y.o. female who presents to the clinic today for:   HPI     Retina Follow Up   Patient presents with  CRVO/BRVO.  In both eyes.  This started 9 months ago.  Duration of 9 months.  Since onset it is stable.        Comments   9 month f/u OU with OCT and FP      Last edited by Reather Littler, COA on 05/16/2021  8:09 AM.      Referring physician: Rita Ohara, [place],  [place] [address]  HISTORICAL INFORMATION:   Selected notes from the MEDICAL RECORD NUMBER    Lab Results  Component Value Date   HGBA1C 5.5 08/10/2013     CURRENT MEDICATIONS: No current outpatient medications on file. (Ophthalmic Drugs)   No current facility-administered medications for this visit. (Ophthalmic Drugs)   Current Outpatient Medications (Other)  Medication Sig   atenolol (TENORMIN) 100 MG tablet Take 1 tablet (100 mg total) by mouth daily.   atorvastatin (LIPITOR) 10 MG tablet Take 1 tablet (10 mg total) by mouth daily.   benazepril (LOTENSIN) 40 MG tablet Take 1 tablet (40 mg total) by mouth daily.   cholecalciferol (VITAMIN D) 1000 UNITS tablet Take 1,000 Units by mouth daily.   clopidogrel (PLAVIX) 75 MG tablet Take 1 tablet (75 mg total) by mouth daily with breakfast.   Coenzyme Q10 (COQ10 PO) Take 1 capsule by mouth daily.   furosemide (LASIX) 20 MG tablet Take 1 tablet (20 mg total) by mouth as needed for fluid or edema. (Patient not taking: Reported on 10/20/2020)   GLUCOSAMINE-CHONDROITIN-VIT D3 PO Take 1 tablet by mouth every other day.   hydrochlorothiazide (HYDRODIURIL) 25 MG tablet Take 1 tablet (25 mg total) by mouth daily.   Multiple Vitamin (MULTIVITAMIN) tablet Take 1 tablet by mouth daily.   nitroGLYCERIN (NITROSTAT) 0.4 MG SL tablet Place 1 tablet (0.4 mg total) under the  tongue every 5 (five) minutes as needed for chest pain. (Patient not taking: Reported on 10/20/2020)   polyethylene glycol (MIRALAX / GLYCOLAX) 17 g packet Take 17 g by mouth daily.   potassium chloride (K-DUR) 10 MEQ tablet Take 10 mEq by mouth every 3 (three) days.    No current facility-administered medications for this visit. (Other)      REVIEW OF SYSTEMS:    ALLERGIES Allergies  Allergen Reactions   Contrast Media [Iodinated Diagnostic Agents] Hives   Zetia [Ezetimibe] Other (See Comments)    Muscle cramps   Oxycodone Nausea And Vomiting and Other (See Comments)    Extreme nausea and vomiting   Repatha [Evolocumab] Other (See Comments)    Muscle cramps    PAST MEDICAL HISTORY Past Medical History:  Diagnosis Date   Adenomatous colon polyp 10/09   CHF (congestive heart failure) ([place])    Constipation    Coronary artery disease    Last cath 12/14 Cath 12/8 100% SVG occlusion to RCA, 95% SVG.  The stenosis to OM.  LIMA to LAD patent but diffuse disease in LAD after graft.  Overlapping stents mid/distal SVG to OM.   Decubitus ulcer of coccyx 10/22/12   1/2 inch raw open area on coccyx   Hyperlipidemia    Hypertension  Dr.Henry Tamala Julian   Osteopenia 9/09   PAF (paroxysmal atrial fibrillation) ([place])    during cath/notes 10/16/2012   Right posterior capsular opacification 11/16/2019   S/P CABG x 3 10/22/2012   LIMA to LAD, SVG to distal RCA, SVG to OM, EVH from right thigh   S/P Maze operation for atrial fibrillation 10/22/2012   Left side lesion set using bipolar radiofrequency and cryothermy ablation with clipping of LA appendage   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   Past Surgical History:  Procedure Laterality Date   BREAST CYST EXCISION Left 1980-1990's   x 3   CARDIAC CATHETERIZATION  10/16/2012   CATARACT EXTRACTION, BILATERAL Bilateral    03/2015 and 05/2015   CATARACT EXTRACTION, BILATERAL Bilateral approx 2016   CORONARY ANGIOPLASTY WITH STENT  PLACEMENT  04/01/2013   "1" (06/01/2013)   CORONARY ARTERY BYPASS GRAFT N/A 10/22/2012   Procedure: CORONARY ARTERY BYPASS GRAFTING (CABG);  Surgeon: Rexene Alberts, MD;  Location: [facility];  Service: Open Heart Surgery;  Laterality: N/A;   CORONARY ATHERECTOMY N/A 04/29/2019   Procedure: CORONARY ATHERECTOMY;  Surgeon: Martinique, Peter M, MD;  Location: [facility];  Service: Cardiovascular;  Laterality: N/A;   CORONARY CTO INTERVENTION N/A 04/29/2019   Procedure: CORONARY CTO INTERVENTION;  Surgeon: Martinique, Peter M, MD;  Location: [facility];  Service: Cardiovascular;  Laterality: N/A;   CORONARY STENT INTERVENTION N/A 01/01/2019   Procedure: CORONARY STENT INTERVENTION;  Surgeon: Belva Crome, MD;  Location: [facility];  Service: Cardiovascular;  Laterality: N/A;   DILATION AND CURETTAGE OF UTERUS     INTRAOPERATIVE TRANSESOPHAGEAL ECHOCARDIOGRAM N/A 10/22/2012   Procedure: INTRAOPERATIVE TRANSESOPHAGEAL ECHOCARDIOGRAM;  Surgeon: Rexene Alberts, MD;  Location: [facility];  Service: Open Heart Surgery;  Laterality: N/A;   LEFT HEART CATH AND CORS/GRAFTS ANGIOGRAPHY N/A 01/01/2019   Procedure: LEFT HEART CATH AND CORS/GRAFTS ANGIOGRAPHY;  Surgeon: Belva Crome, MD;  Location: [facility];  Service: Cardiovascular;  Laterality: N/A;   LEFT HEART CATH AND CORS/GRAFTS ANGIOGRAPHY N/A 04/28/2019   Procedure: LEFT HEART CATH AND CORS/GRAFTS ANGIOGRAPHY;  Surgeon: Belva Crome, MD;  Location: [facility];  Service: Cardiovascular;  Laterality: N/A;   LEFT HEART CATHETERIZATION WITH CORONARY ANGIOGRAM N/A 10/16/2012   Procedure: LEFT HEART CATHETERIZATION WITH CORONARY ANGIOGRAM;  Surgeon: Sinclair Grooms, MD;  Location: [facility] CATH LAB;  Service: Cardiovascular;  Laterality: N/A;   LEFT HEART CATHETERIZATION WITH CORONARY/GRAFT ANGIOGRAM N/A 06/01/2013   Procedure: LEFT HEART CATHETERIZATION WITH Beatrix Fetters;  Surgeon: Sinclair Grooms, MD;  Location: [facility] CATH LAB;   Service: Cardiovascular;  Laterality: N/A;   MAZE N/A 10/22/2012   Procedure: MAZE;  Surgeon: Rexene Alberts, MD;  Location: [facility];  Service: Open Heart Surgery;  Laterality: N/A;   PERCUTANEOUS CORONARY STENT INTERVENTION (PCI-S)  06/01/2013   Procedure: PERCUTANEOUS CORONARY STENT INTERVENTION (PCI-S);  Surgeon: Sinclair Grooms, MD;  Location: [facility] CATH LAB;  Service: Cardiovascular;;   TONSILLECTOMY  1949   TOTAL ABDOMINAL HYSTERECTOMY W/ BILATERAL SALPINGOOPHORECTOMY  1990   benign growth    FAMILY HISTORY Family History  Problem Relation Age of Onset   Hypertension Mother    Heart attack Mother 7   Hypertension Brother    Cancer Brother        prostate (76), gall bladder (dx 85)   Heart disease Brother 18       stent   Prostate cancer Brother  Heart disease Other        x2 (age 68 and 47)   Diabetes Neg Hx    Breast cancer Neg Hx    Colon cancer Neg Hx     SOCIAL HISTORY Social History   Tobacco Use   Smoking status: Never   Smokeless tobacco: Never  Vaping Use   Vaping Use: Never used  Substance Use Topics   Alcohol use: Yes    Alcohol/week: 2.0 standard drinks    Types: 2 Glasses of wine per week    Comment: 0-2 glasses of wine/week   Drug use: No         OPHTHALMIC EXAM:  Base Eye Exam     Visual Acuity (ETDRS)       Right Left   Dist cc 20/25 -2 20/25 -2    Correction: Glasses         Tonometry (Tonopen, 8:15 AM)       Right Left   Pressure 12 13         Pupils       Pupils   Right PERRL   Left PERRL         Visual Fields (Counting fingers)       Left Right    Full Full         Extraocular Movement       Right Left    Full, Ortho Full, Ortho         Neuro/Psych     Oriented x3: Yes   Mood/Affect: Normal         Dilation     Both eyes: 1.0% Mydriacyl, 2.5% Phenylephrine @ 8:15 AM           Slit Lamp and Fundus Exam     External Exam       Right Left   External Normal Normal         Slit  Lamp Exam       Right Left   Lids/Lashes Normal Normal   Conjunctiva/Sclera White and quiet White and quiet   Cornea Clear Clear   Anterior Chamber Deep and quiet Deep and quiet   Iris Round and reactive Round and reactive   Lens Centered posterior chamber intraocular lens, Open posterior capsule Centered posterior chamber intraocular lens, Open posterior capsule   Anterior Vitreous Normal Normal         Fundus Exam       Right Left   Posterior Vitreous Posterior vitreous detachment Posterior vitreous detachment   Disc Normal Normal   C/D Ratio 0.2 0.2   Macula Microaneurysms, no macular thickening Normal   Vessels Old retinal vein occlusion in the right eye, old IT BRAO, good sector PRP, compensated no active disease Normal   Periphery Normal Normal            IMAGING AND PROCEDURES  Imaging and Procedures for 05/16/21  OCT, Retina - OU - Both Eyes       Right Eye Central Foveal Thickness: 279. Progression has been stable. Findings include no IRF, no SRF, normal foveal contour.   Left Eye Quality was good. Scan locations included subfoveal. Central Foveal Thickness: 271. Progression has been stable. Findings include retinal drusen , no IRF, normal foveal contour.   Notes Region inferotemporal of atrophy, no active CME, OD.  PVD OD, OS   Only minor thickening of Bruch's membrane, no overt drusenoid change, no complications of ARMD     Color Fundus Photography Optos -  OU - Both Eyes       Right Eye Progression has been stable. Disc findings include normal observations. Macula : drusen.   Left Eye Progression has been stable. Disc findings include normal observations.   Notes OD - drusen, good sector PRP inferotemporal in region of retinal nonperfusion  OS - stable             ASSESSMENT/PLAN:  Central retinal vein occlusion of right eye Stable without macular edema,  Branch retinal artery occlusion of right eye Inferotemporal region of  retinal nonperfusion well treated sector PRP no neovascular complications  Early stage nonexudative age-related macular degeneration of both eyes Rare hard drusen in the macular region of each eye Yet with mid retinal peripheral drusen, observe     ICD-10-CM   1. Stable central retinal vein occlusion of right eye  H34.8112 OCT, Retina - OU - Both Eyes    Color Fundus Photography Optos - OU - Both Eyes    2. Branch retinal artery occlusion of right eye  H34.231     3. Early stage nonexudative age-related macular degeneration of both eyes  H35.3131       1.  OD with a history of retinal vein occlusion secondary CME which is improved in the past with therapy and now also stabilized with sector PRP inferotemporally in the region of retinal artery occlusion in the past.  No signs of NVE no signs of recurrent CME  2.  Minor central macular ARMD.  Does not require oral vitamin therapy may use these as an option  3.  Mid peripheral drusen no pathologic significance observe  Ophthalmic Meds Ordered this visit:  No orders of the defined types were placed in this encounter.      Return in about 1 year (around 05/16/2022) for DILATE OU, COLOR FP, OCT.  There are no Patient Instructions on file for this visit.   Explained the diagnoses, plan, and follow up with the patient and they expressed understanding.  Patient expressed understanding of the importance of proper follow up care.   Clent Demark Shonda Mandarino M.D. Diseases & Surgery of the Retina and Vitreous [facility] [place] 05/16/21     Abbreviations: M myopia (nearsighted); A astigmatism; H hyperopia (farsighted); P presbyopia; Mrx spectacle prescription;  CTL contact lenses; OD right eye; OS left eye; OU both eyes  XT exotropia; ET esotropia; PEK punctate epithelial keratitis; PEE punctate epithelial erosions; DES dry eye syndrome; MGD meibomian gland dysfunction; ATs artificial tears; PFAT's preservative free artificial tears;  [place] nuclear sclerotic cataract; PSC posterior subcapsular cataract; ERM epi-retinal membrane; PVD posterior vitreous detachment; RD retinal detachment; DM diabetes mellitus; DR diabetic retinopathy; NPDR non-proliferative diabetic retinopathy; PDR proliferative diabetic retinopathy; CSME clinically significant macular edema; DME diabetic macular edema; dbh dot blot hemorrhages; CWS cotton wool spot; POAG primary open angle glaucoma; C/D cup-to-disc ratio; HVF humphrey visual field; GVF goldmann visual field; OCT optical coherence tomography; IOP intraocular pressure; BRVO Branch retinal vein occlusion; CRVO central retinal vein occlusion; CRAO central retinal artery occlusion; BRAO branch retinal artery occlusion; RT retinal tear; SB scleral buckle; PPV pars plana vitrectomy; VH Vitreous hemorrhage; PRP panretinal laser photocoagulation; IVK intravitreal kenalog; VMT vitreomacular traction; MH Macular hole;  NVD neovascularization of the disc; NVE neovascularization elsewhere; AREDS age related eye disease study; ARMD age related macular degeneration; POAG primary open angle glaucoma; EBMD epithelial/anterior basement membrane dystrophy; ACIOL anterior chamber intraocular lens; IOL intraocular lens; PCIOL posterior chamber intraocular lens; Phaco/IOL phacoemulsification with intraocular  lens placement; [place] photorefractive keratectomy; LASIK laser assisted in situ keratomileusis; HTN hypertension; DM diabetes mellitus; COPD chronic obstructive pulmonary disease

## 2021-05-16 NOTE — Assessment & Plan Note (Signed)
Rare hard drusen in the macular region of each eye Yet with mid retinal peripheral drusen, observe

## 2021-05-29 ENCOUNTER — Encounter: Payer: Self-pay | Admitting: Family Medicine

## 2021-07-14 ENCOUNTER — Other Ambulatory Visit: Payer: Self-pay | Admitting: Interventional Cardiology

## 2021-07-18 ENCOUNTER — Other Ambulatory Visit: Payer: Self-pay | Admitting: Interventional Cardiology

## 2021-07-31 ENCOUNTER — Other Ambulatory Visit: Payer: Self-pay | Admitting: Interventional Cardiology

## 2021-08-28 ENCOUNTER — Other Ambulatory Visit: Payer: Self-pay

## 2021-08-28 MED ORDER — BENAZEPRIL HCL 40 MG PO TABS: 40.0000 mg | ORAL_TABLET | Freq: Every day | ORAL | 0 refills | Status: DC

## 2021-09-08 ENCOUNTER — Telehealth: Payer: Self-pay | Admitting: Gastroenterology

## 2021-09-08 NOTE — Telephone Encounter (Signed)
Hi Dr. Lyndel Safe, ? ?D.O.D ? ?We received a referral for patient to have another colonoscopy. We received records from [facility] for you to review. It looks like she had a colonoscopy back in 2016. Please advise on scheduling.  ? ?Thank you ?

## 2021-09-10 NOTE — Progress Notes (Signed)
?Cardiology Office Note:   ? ?Date:  09/11/2021  ? ?ID:  Claudia Howell, DOB October 11, 1940, MRN [id number] ? ?PCP:  Rita Ohara, MD  ?Cardiologist:  Sinclair Grooms, MD  ? ?Referring MD: Rita Ohara, MD  ? ?Chief Complaint  ?Patient presents with  ? Congestive Heart Failure  ? ? ?History of Present Illness:   ? ?Claudia Howell is a 81 y.o. female with a hx of coronary artery disease with bypass graft (January/2014), saphenous vein graft stent (10/2012), hypertension, acute on chronic systolic and diastolic heart failure, history of paroxysmal atrial fibrillation, Maze procedure doing coronary bypass grafting, and essential hypertension. Unstable angina 04/28/2019 treated with RCA CTO and stent ([place]). ? ? ?Dyspnea on exertion progressive over the past 6 to 12 months.  No chest discomfort.  Denies orthopnea, PND, lower extremity swelling.  No nitroglycerin use.  States that she can either vacuum and clean her house or go food shopping.  She cannot do both in the same day because it tires out too much. ? ?Past Medical History:  ?Diagnosis Date  ? Adenomatous colon polyp 10/09  ? CHF (congestive heart failure) ([place])   ? Constipation   ? Coronary artery disease   ? Last cath 12/14 Cath 12/8 100% SVG occlusion to RCA, 95% SVG.  The stenosis to OM.  LIMA to LAD patent but diffuse disease in LAD after graft.  Overlapping stents mid/distal SVG to OM.  ? Decubitus ulcer of coccyx 10/22/12  ? 1/2 inch raw open area on coccyx  ? Hyperlipidemia   ? Hypertension   ? Dr.Henry Tamala Julian  ? Osteopenia 9/09  ? PAF (paroxysmal atrial fibrillation) ([place])   ? during cath/notes 10/16/2012  ? Right posterior capsular opacification 11/16/2019  ? S/P CABG x 3 10/22/2012  ? LIMA to LAD, SVG to distal RCA, SVG to OM, EVH from right thigh  ? S/P Maze operation for atrial fibrillation 10/22/2012  ? Left side lesion set using bipolar radiofrequency and cryothermy ablation with clipping of LA appendage  ? 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  ? ? ?Past  Surgical History:  ?Procedure Laterality Date  ? BREAST CYST EXCISION Left 1980-1990's  ? x 3  ? CARDIAC CATHETERIZATION  10/16/2012  ? CATARACT EXTRACTION, BILATERAL Bilateral   ? 03/2015 and 05/2015  ? CATARACT EXTRACTION, BILATERAL Bilateral approx 2016  ? CORONARY ANGIOPLASTY WITH STENT PLACEMENT  04/01/2013  ? "1" (06/01/2013)  ? CORONARY ARTERY BYPASS GRAFT N/A 10/22/2012  ? Procedure: CORONARY ARTERY BYPASS GRAFTING (CABG);  Surgeon: Rexene Alberts, MD;  Location: [facility];  Service: Open Heart Surgery;  Laterality: N/A;  ? CORONARY ATHERECTOMY N/A 04/29/2019  ? Procedure: CORONARY ATHERECTOMY;  Surgeon: Martinique, Peter M, MD;  Location: [facility];  Service: Cardiovascular;  Laterality: N/A;  ? CORONARY CTO INTERVENTION N/A 04/29/2019  ? Procedure: CORONARY CTO INTERVENTION;  Surgeon: Martinique, Peter M, MD;  Location: [facility];  Service: Cardiovascular;  Laterality: N/A;  ? CORONARY STENT INTERVENTION N/A 01/01/2019  ? Procedure: CORONARY STENT INTERVENTION;  Surgeon: Belva Crome, MD;  Location: [facility];  Service: Cardiovascular;  Laterality: N/A;  ? DILATION AND CURETTAGE OF UTERUS    ? INTRAOPERATIVE TRANSESOPHAGEAL ECHOCARDIOGRAM N/A 10/22/2012  ? Procedure: INTRAOPERATIVE TRANSESOPHAGEAL ECHOCARDIOGRAM;  Surgeon: Rexene Alberts, MD;  Location: [facility];  Service: Open Heart Surgery;  Laterality: N/A;  ? LEFT HEART CATH AND CORS/GRAFTS ANGIOGRAPHY N/A 01/01/2019  ? Procedure: LEFT HEART CATH AND  CORS/GRAFTS ANGIOGRAPHY;  Surgeon: Belva Crome, MD;  Location: [facility];  Service: Cardiovascular;  Laterality: N/A;  ? LEFT HEART CATH AND CORS/GRAFTS ANGIOGRAPHY N/A 04/28/2019  ? Procedure: LEFT HEART CATH AND CORS/GRAFTS ANGIOGRAPHY;  Surgeon: Belva Crome, MD;  Location: [facility];  Service: Cardiovascular;  Laterality: N/A;  ? LEFT HEART CATHETERIZATION WITH CORONARY ANGIOGRAM N/A 10/16/2012  ? Procedure: LEFT HEART CATHETERIZATION WITH CORONARY ANGIOGRAM;  Surgeon: Sinclair Grooms, MD;  Location: [facility] CATH LAB;  Service: Cardiovascular;  Laterality: N/A;  ? LEFT HEART CATHETERIZATION WITH CORONARY/GRAFT ANGIOGRAM N/A 06/01/2013  ? Procedure: LEFT HEART CATHETERIZATION WITH Beatrix Fetters;  Surgeon: Sinclair Grooms, MD;  Location[facility];  Service: Cardiovascular;  Laterality: N/A;  ? MAZE N/A 10/22/2012  ? Procedure: MAZE;  Surgeon: Rexene Alberts, MD;  Location: [facility];  Service: Open Heart Surgery;  Laterality: N/A;  ? PERCUTANEOUS CORONARY STENT INTERVENTION (PCI-S)  06/01/2013  ? Procedure: PERCUTANEOUS CORONARY STENT INTERVENTION (PCI-S);  Surgeon: Sinclair Grooms, MD;  Location: [facility] CATH LAB;  Service: Cardiovascular;;  ? TONSILLECTOMY  1949  ? TOTAL ABDOMINAL HYSTERECTOMY W/ BILATERAL SALPINGOOPHORECTOMY  1990  ? benign growth  ? ? ?Current Medications: ?Current Meds  ?Medication Sig  ? atenolol (TENORMIN) 100 MG tablet TAKE 1 TABLET(100 MG) BY MOUTH DAILY  ? atorvastatin (LIPITOR) 10 MG tablet Take 1 tablet (10 mg total) by mouth daily.  ? benazepril (LOTENSIN) 40 MG tablet Take 1 tablet (40 mg total) by mouth daily. Please keep upcoming appt in March 2023 with Dr. Tamala Julian before anymore refills. Thank you  ? cholecalciferol (VITAMIN D) 1000 UNITS tablet Take 1,000 Units by mouth daily.  ? clopidogrel (PLAVIX) 75 MG tablet TAKE 1 TABLET(75 MG) BY MOUTH DAILY WITH BREAKFAST  ? Coenzyme Q10 (COQ10 PO) Take 1 capsule by mouth daily.  ? dapagliflozin propanediol (FARXIGA) 10 MG TABS tablet Take 1 tablet (10 mg total) by mouth daily before breakfast.  ? furosemide (LASIX) 20 MG tablet Take 1 tablet (20 mg total) by mouth as needed for fluid or edema.  ? GLUCOSAMINE-CHONDROITIN-VIT D3 PO Take 1 tablet by mouth every other day.  ? hydrochlorothiazide (HYDRODIURIL) 25 MG tablet Take 1 tablet (25 mg total) by mouth daily. Please keep yearly appointment in March 2023 for future refills. Thank you  ? Multiple Vitamin (MULTIVITAMIN) tablet Take 1 tablet by mouth daily.  ?  nitroGLYCERIN (NITROSTAT) 0.4 MG SL tablet Place 1 tablet (0.4 mg total) under the tongue every 5 (five) minutes as needed for chest pain.  ? polyethylene glycol (MIRALAX / GLYCOLAX) 17 g packet Take 17 g by mouth daily.  ? potassium chloride (K-DUR) 10 MEQ tablet Take 10 mEq by mouth every 3 (three) days.   ?  ? ?Allergies:   Contrast media [iodinated contrast media], Zetia [ezetimibe], Oxycodone, and Repatha [evolocumab]  ? ?Social History  ? ?Socioeconomic History  ? Marital status: Widowed  ?  Spouse name: Not on file  ? Number of children: 2  ? Years of education: 21  ? Highest education level: Associate degree: occupational, Hotel manager, or vocational program  ?Occupational History  ? 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 Smoking status: Never  ? Smokeless tobacco: Never  ?Vaping Use  ? Vaping Use: Never used  ?Substance and Sexual Activity  ? Alcohol use: Yes  ?  Alcohol/week: 2.0 standard drinks  ?  Types: 2 Glasses of wine  per week  ?  Comment: 0-2 glasses of wine/week  ? Drug use: No  ? Sexual activity: Not Currently  ?Other Topics Concern  ? Not on file  ?Social History Narrative  ? Widowed.  Lives alone.  Retired Corporate treasurer.  1 son in [place], 1 son in [place]. 7 grandchildren, 2 great granddaughters, 1 great grandson (in [place])  ? ?Social Determinants of Health  ? ?Financial Resource Strain: Not on file  ?Food Insecurity: Not on file  ?Transportation Needs: Not on file  ?Physical Activity: Not on file  ?Stress: Not on file  ?Social Connections: Not on file  ?  ? ?Family History: ?The patient's family history includes Cancer in her brother; Heart attack (age of onset: 70) in her mother; Heart disease in an other family member; Heart disease (age of onset: 53) in her brother; Hypertension in her brother and mother; Prostate cancer in her brother. There is no history of Diabetes, Breast cancer, or Colon cancer. ? ?ROS:   ?Please see the history of present illness.    ?Wonders if the way she feels is related to  getting older.  Still taking tai chi and stretching exercises.  All other systems reviewed and are negative. ? ?EKGs/Labs/Other Studies Reviewed:   ? ?The following studies were reviewed today: ? ?2D

## 2021-09-11 ENCOUNTER — Other Ambulatory Visit: Payer: Self-pay

## 2021-09-11 ENCOUNTER — Encounter: Payer: Self-pay | Admitting: Interventional Cardiology

## 2021-09-11 ENCOUNTER — Ambulatory Visit (INDEPENDENT_AMBULATORY_CARE_PROVIDER_SITE_OTHER): Payer: Medicare Other | Admitting: Interventional Cardiology

## 2021-09-11 VITALS — BP 142/62 | HR 68 | Ht 65.0 in | Wt 134.2 lb

## 2021-09-11 DIAGNOSIS — E7849 Other hyperlipidemia: Secondary | ICD-10-CM | POA: Diagnosis not present

## 2021-09-11 DIAGNOSIS — I480 Paroxysmal atrial fibrillation: Secondary | ICD-10-CM | POA: Diagnosis not present

## 2021-09-11 DIAGNOSIS — Z8679 Personal history of other diseases of the circulatory system: Secondary | ICD-10-CM | POA: Diagnosis not present

## 2021-09-11 DIAGNOSIS — I10 Essential (primary) hypertension: Secondary | ICD-10-CM | POA: Diagnosis not present

## 2021-09-11 DIAGNOSIS — R0609 Other forms of dyspnea: Secondary | ICD-10-CM | POA: Diagnosis not present

## 2021-09-11 DIAGNOSIS — I25708 Atherosclerosis of coronary artery bypass graft(s), unspecified, with other forms of angina pectoris: Secondary | ICD-10-CM | POA: Diagnosis not present

## 2021-09-11 DIAGNOSIS — Z9889 Other specified postprocedural states: Secondary | ICD-10-CM | POA: Diagnosis not present

## 2021-09-11 DIAGNOSIS — I5032 Chronic diastolic (congestive) heart failure: Secondary | ICD-10-CM

## 2021-09-11 MED ORDER — DAPAGLIFLOZIN PROPANEDIOL 10 MG PO TABS: 10.0000 mg | ORAL_TABLET | Freq: Every day | ORAL | 11 refills | Status: DC

## 2021-09-11 NOTE — Patient Instructions (Signed)
Medication Instructions:  ?1) START Farxiga '10mg'$  once daily ? ?*If you need a refill on your cardiac medications before your next appointment, please call your pharmacy* ? ? ?Lab Work: ?BMET at next appointment ? ?If you have labs (blood work) drawn today and your tests are completely normal, you will receive your results only by: ?MyChart Message (if you have MyChart) OR ?A paper copy in the mail ?If you have any lab test that is abnormal or we need to change your treatment, we will call you to review the results. ? ? ?Testing/Procedures: ?None ? ? ?Follow-Up: ?At [facility], you and your health needs are our priority.  As part of our continuing mission to provide you with exceptional heart care, we have created designated Provider Care Teams.  These Care Teams include your primary Cardiologist (physician) and Advanced Practice Providers (APPs -  Physician Assistants and Nurse Practitioners) who all work together to provide you with the care you need, when you need it. ? ?We recommend signing up for the patient portal called "MyChart".  Sign up information is provided on this After Visit Summary.  MyChart is used to connect with patients for Virtual Visits (Telemedicine).  Patients are able to view lab/test results, encounter notes, upcoming appointments, etc.  Non-urgent messages can be sent to your provider as well.   ?To learn more about what you can do with MyChart, go to [email or web address].   ? ?Your next appointment:   ?1 month(s) ? ?The format for your next appointment:   ?In Person ? ?Provider:   ?Sinclair Grooms, MD  ? ? ?Other Instructions ?  ?

## 2021-09-25 ENCOUNTER — Encounter: Payer: Self-pay | Admitting: Gastroenterology

## 2021-09-25 NOTE — Telephone Encounter (Signed)
Scheduled 5-17 with Dr Lyndel Safe. Records in drawer ?

## 2021-10-06 ENCOUNTER — Other Ambulatory Visit: Payer: Self-pay | Admitting: *Deleted

## 2021-10-06 MED ORDER — ATORVASTATIN CALCIUM 10 MG PO TABS: 10.0000 mg | ORAL_TABLET | Freq: Every day | ORAL | 3 refills | Status: DC

## 2021-10-10 ENCOUNTER — Other Ambulatory Visit: Payer: Self-pay | Admitting: *Deleted

## 2021-10-10 MED ORDER — ATENOLOL 100 MG PO TABS: ORAL_TABLET | ORAL | 3 refills | Status: DC

## 2021-10-17 NOTE — Progress Notes (Signed)
?Cardiology Office Note:   ? ?Date:  10/18/2021  ? ?ID:  Claudia Howell, DOB [birth year], MRN [id number] ? ?PCP:  Claudia Ohara, MD  ?Cardiologist:  Claudia Grooms, MD  ? ?Referring MD: Claudia Ohara, MD  ? ?Chief Complaint  ?Patient presents with  ? Coronary Artery Disease  ? Congestive Heart Failure  ? ? ?History of Present Illness:   ? ?Claudia Howell is a 81 y.o. female with a hx of coronary artery disease with bypass graft (January/2014), saphenous vein graft stent (10/2012), hypertension, acute on chronic systolic and diastolic heart failure, history of paroxysmal atrial fibrillation, Maze procedure doing coronary bypass grafting, and essential hypertension. Unstable angina 04/28/2019 treated with RCA CTO and stent ([place]). ? ?On 09/11/2021 was started on Farxiga for diastolic HF causing DOE. ? ?Initially she told me that [place] did not do her any good.  After we discussed the findings from her prior echo she wondered if she should resume the [place].  After some discussion, we decided to continue it. ? ?I have recommended that she start a walking program, initially starting with 5 minutes a day and attempting to increase to a 30-minute walk at least 5 days/week.  If she is unable to achieve that level of endurance, I would then recommend an echocardiogram and perhaps reenrollment in cardiac rehab. ? ?Past Medical History:  ?Diagnosis Date  ? Adenomatous colon polyp 10/09  ? CHF (congestive heart failure) ([place])   ? Constipation   ? Coronary artery disease   ? Last cath 12/14 Cath 12/8 100% SVG occlusion to RCA, 95% SVG.  The stenosis to OM.  LIMA to LAD patent but diffuse disease in LAD after graft.  Overlapping stents mid/distal SVG to OM.  ? Decubitus ulcer of coccyx 10/22/12  ? 1/2 inch raw open area on coccyx  ? Hyperlipidemia   ? Hypertension   ? Dr.Shadai Mcclane Claudia Howell  ? Osteopenia 9/09  ? PAF (paroxysmal atrial fibrillation) ([place])   ? during cath/notes 10/16/2012  ? Right posterior capsular opacification 11/16/2019  ? S/P  CABG x 3 10/22/2012  ? LIMA to LAD, SVG to distal RCA, SVG to OM, EVH from right thigh  ? S/P Maze operation for atrial fibrillation 10/22/2012  ? Left side lesion set using bipolar radiofrequency and cryothermy ablation with clipping of LA appendage  ? 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  ? ? ?Past Surgical History:  ?Procedure Laterality Date  ? BREAST CYST EXCISION Left 1980-1990's  ? x 3  ? CARDIAC CATHETERIZATION  10/16/2012  ? CATARACT EXTRACTION, BILATERAL Bilateral   ? 03/2015 and 05/2015  ? CATARACT EXTRACTION, BILATERAL Bilateral approx 2016  ? CORONARY ANGIOPLASTY WITH STENT PLACEMENT  04/01/2013  ? "1" (06/01/2013)  ? CORONARY ARTERY BYPASS GRAFT N/A 10/22/2012  ? Procedure: CORONARY ARTERY BYPASS GRAFTING (CABG);  Surgeon: Rexene Alberts, MD;  Location: [place];  Service: Open Heart Surgery;  Laterality: N/A;  ? CORONARY ATHERECTOMY N/A 04/29/2019  ? Procedure: CORONARY ATHERECTOMY;  Surgeon: Martinique, Peter M, MD;  Location: [facility];  Service: Cardiovascular;  Laterality: N/A;  ? CORONARY CTO INTERVENTION N/A 04/29/2019  ? Procedure: CORONARY CTO INTERVENTION;  Surgeon: Martinique, Peter M, MD;  Location: [place];  Service: Cardiovascular;  Laterality: N/A;  ? CORONARY STENT INTERVENTION N/A 01/01/2019  ? Procedure: CORONARY STENT INTERVENTION;  Surgeon: Belva Crome, MD;  Location: [place] [facility];  Service: Cardiovascular;  Laterality: N/A;  ? DILATION AND CURETTAGE OF  UTERUS    ? INTRAOPERATIVE TRANSESOPHAGEAL ECHOCARDIOGRAM N/A 10/22/2012  ? Procedure: INTRAOPERATIVE TRANSESOPHAGEAL ECHOCARDIOGRAM;  Surgeon: Rexene Alberts, MD;  Location: [place];  Service: Open Heart Surgery;  Laterality: N/A;  ? LEFT HEART CATH AND CORS/GRAFTS ANGIOGRAPHY N/A 01/01/2019  ? Procedure: LEFT HEART CATH AND CORS/GRAFTS ANGIOGRAPHY;  Surgeon: Belva Crome, MD;  Location: [facility] [facility];  Service: Cardiovascular;  Laterality: N/A;  ? LEFT HEART CATH AND CORS/GRAFTS ANGIOGRAPHY N/A 04/28/2019  ?  Procedure: LEFT HEART CATH AND CORS/GRAFTS ANGIOGRAPHY;  Surgeon: Belva Crome, MD;  Location: [place] [facility];  Service: Cardiovascular;  Laterality: N/A;  ? LEFT HEART CATHETERIZATION WITH CORONARY ANGIOGRAM N/A 10/16/2012  ? Procedure: LEFT HEART CATHETERIZATION WITH CORONARY ANGIOGRAM;  Surgeon: Claudia Grooms, MD;  Location: [facility] C[facility];  Service: Cardiovascular;  Laterality: N/A;  ? LEFT HEART CATHETERIZATION WITH CORONARY/GRAFT ANGIOGRAM N/A 06/01/2013  ? Procedure: LEFT HEART CATHETERIZATION WITH Claudia Howell;  Surgeon: Claudia Grooms, MD;  Location: [facility] CATH LAB;  Service: Cardiovascular;  Laterality: N/A;  ? MAZE N/A 10/22/2012  ? Procedure: MAZE;  Surgeon: Rexene Alberts, MD;  Location: [place];  Service: Open Heart Surgery;  Laterality: N/A;  ? PERCUTANEOUS CORONARY STENT INTERVENTION (PCI-S)  06/01/2013  ? Procedure: PERCUTANEOUS CORONARY STENT INTERVENTION (PCI-S);  Surgeon: Claudia Grooms, MD;  Location: [facility] CATH LAB;  Service: Cardiovascular;;  ? TONSILLECTOMY  1949  ? TOTAL ABDOMINAL HYSTERECTOMY W/ BILATERAL SALPINGOOPHORECTOMY  1990  ? benign growth  ? ? ?Current Medications: ?Current Meds  ?Medication Sig  ? atenolol (TENORMIN) 100 MG tablet TAKE 1 TABLET(100 MG) BY MOUTH DAILY  ? atorvastatin (LIPITOR) 10 MG tablet Take 1 tablet (10 mg total) by mouth daily.  ? benazepril (LOTENSIN) 40 MG tablet Take 1 tablet (40 mg total) by mouth daily. Please keep upcoming appt in March 2023 with Dr. Tamala Howell before anymore refills. Thank you  ? cholecalciferol (VITAMIN D) 1000 UNITS tablet Take 1,000 Units by mouth daily.  ? clopidogrel (PLAVIX) 75 MG tablet TAKE 1 TABLET(75 MG) BY MOUTH DAILY WITH BREAKFAST  ? Coenzyme Q10 (COQ10 PO) Take 1 capsule by mouth daily.  ? dapagliflozin propanediol (FARXIGA) 10 MG TABS tablet Take 1 tablet (10 mg total) by mouth daily before breakfast.  ? furosemide (LASIX) 20 MG tablet Take 1 tablet (20 mg total) by mouth as needed for fluid or edema.  ?  GLUCOSAMINE-CHONDROITIN-VIT D3 PO Take 1 tablet by mouth every other day.  ? hydrochlorothiazide (HYDRODIURIL) 25 MG tablet Take 1 tablet (25 mg total) by mouth daily. Please keep yearly appointment in March 2023 for future refills. Thank you  ? Multiple Vitamin (MULTIVITAMIN) tablet Take 1 tablet by mouth daily.  ? nitroGLYCERIN (NITROSTAT) 0.4 MG SL tablet Place 1 tablet (0.4 mg total) under the tongue every 5 (five) minutes as needed for chest pain.  ? polyethylene glycol (MIRALAX / GLYCOLAX) 17 g packet Take 17 g by mouth daily.  ? potassium chloride (K-DUR) 10 MEQ tablet Take 10 mEq by mouth every 3 (three) days.   ?  ? ?Allergies:   Contrast media [iodinated contrast media], Pravastatin, Simvastatin, Tramadol hcl, Zetia [ezetimibe], Oxycodone, and Repatha [evolocumab]  ? ?Social History  ? ?Socioeconomic History  ? Marital status: Widowed  ?  Spouse name: Not on file  ? Number of children: 2  ? Years of education: 25  ? Highest education level: Associate degree: occupational, Hotel manager, or vocational program  ?Occupational History  ?  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 Smoking status: Never  ? Smokeless tobacco: Never  ?Vaping Use  ? Vaping Use: Never used  ?Substance and Sexual Activity  ? Alcohol use: Yes  ?  Alcohol/week: 2.0 standard drinks  ?  Types: 2 Glasses of wine per week  ?  Comment: 0-2 glasses of wine/week  ? Drug use: No  ? Sexual activity: Not Currently  ?Other Topics Concern  ? Not on file  ?Social History Narrative  ? Widowed.  Lives alone.  Retired Corporate treasurer.  1 son in [place], 1 son in [place]. 7 grandchildren, 2 great granddaughters, 1 great grandson (in [place])  ? ?Social Determinants of Health  ? ?Financial Resource Strain: Not on file  ?Food Insecurity: Not on file  ?Transportation Needs: Not on file  ?Physical Activity: Not on file  ?Stress: Not on file  ?Social Connections: Not on file  ?  ? ?Family History: ?The patient's family history includes Cancer in her brother; Heart attack  (age of onset: 26) in her mother; Heart disease in an other family member; Heart disease (age of onset: 5) in her brother; Hypertension in her brother and mother; Prostate cancer in her brother. There is no histor

## 2021-10-18 ENCOUNTER — Ambulatory Visit (INDEPENDENT_AMBULATORY_CARE_PROVIDER_SITE_OTHER): Payer: Medicare Other | Admitting: Interventional Cardiology

## 2021-10-18 ENCOUNTER — Encounter: Payer: Self-pay | Admitting: Interventional Cardiology

## 2021-10-18 VITALS — BP 122/68 | HR 65 | Ht 65.0 in | Wt 135.6 lb

## 2021-10-18 DIAGNOSIS — E7849 Other hyperlipidemia: Secondary | ICD-10-CM | POA: Diagnosis not present

## 2021-10-18 DIAGNOSIS — I5032 Chronic diastolic (congestive) heart failure: Secondary | ICD-10-CM

## 2021-10-18 DIAGNOSIS — I25708 Atherosclerosis of coronary artery bypass graft(s), unspecified, with other forms of angina pectoris: Secondary | ICD-10-CM

## 2021-10-18 DIAGNOSIS — Z8679 Personal history of other diseases of the circulatory system: Secondary | ICD-10-CM

## 2021-10-18 DIAGNOSIS — I480 Paroxysmal atrial fibrillation: Secondary | ICD-10-CM

## 2021-10-18 DIAGNOSIS — Z9889 Other specified postprocedural states: Secondary | ICD-10-CM | POA: Diagnosis not present

## 2021-10-18 DIAGNOSIS — I10 Essential (primary) hypertension: Secondary | ICD-10-CM | POA: Diagnosis not present

## 2021-10-18 NOTE — Patient Instructions (Signed)
Medication Instructions:  ?Your physician recommends that you continue on your current medications as directed. Please refer to the Current Medication list given to you today. ? ?*If you need a refill on your cardiac medications before your next appointment, please call your pharmacy* ? ?Lab Work: ?NONE ? ?Testing/Procedures: ?NONE ? ?Follow-Up: ?At [facility], you and your health needs are our priority.  As part of our continuing mission to provide you with exceptional heart care, we have created designated Provider Care Teams.  These Care Teams include your primary Cardiologist (physician) and Advanced Practice Providers (APPs -  Physician Assistants and Nurse Practitioners) who all work together to provide you with the care you need, when you need it. ? ?Your next appointment:   ?6 month(s) ? ?The format for your next appointment:   ?In Person ? ?Provider:   ?Sinclair Grooms, MD { ? ?Other Instructions ?Dr. Tamala Julian recommends increasing your walking. Start with 5 minutes of walking daily and work up to 30 minutes at least 5 days a week. ? ? ?Important Information About Sugar ? ? ? ? ?  ?

## 2021-10-19 ENCOUNTER — Encounter: Payer: Self-pay | Admitting: Family Medicine
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
## 2021-10-23 ENCOUNTER — Encounter: Payer: Self-pay | Admitting: Family Medicine

## 2021-10-23 ENCOUNTER — Ambulatory Visit (INDEPENDENT_AMBULATORY_CARE_PROVIDER_SITE_OTHER): Payer: Medicare Other | Admitting: Family Medicine

## 2021-10-23 VITALS — BP 120/70 | HR 68 | Ht 65.0 in | Wt 134.0 lb

## 2021-10-23 DIAGNOSIS — M79671 Pain in right foot: Secondary | ICD-10-CM | POA: Diagnosis not present

## 2021-10-23 DIAGNOSIS — I25708 Atherosclerosis of coronary artery bypass graft(s), unspecified, with other forms of angina pectoris: Secondary | ICD-10-CM | POA: Diagnosis not present

## 2021-10-23 NOTE — Patient Instructions (Signed)
Please try wearing compression/support at your right ankle when standing or walking on it (either ACE wrap, neoprene sleeve or a lace up brace that you wear in your shoe.  Ice after activity.  Continue Voltaren gel as needed. ? ?We are referring you back to Podiatry, so hopefully they will also be able to help. ?

## 2021-10-25 ENCOUNTER — Encounter: Payer: Self-pay | Admitting: Podiatry

## 2021-10-25 ENCOUNTER — Ambulatory Visit (INDEPENDENT_AMBULATORY_CARE_PROVIDER_SITE_OTHER): Payer: Medicare Other

## 2021-10-25 ENCOUNTER — Ambulatory Visit (INDEPENDENT_AMBULATORY_CARE_PROVIDER_SITE_OTHER): Payer: Medicare Other | Admitting: Podiatry

## 2021-10-25 DIAGNOSIS — M778 Other enthesopathies, not elsewhere classified: Secondary | ICD-10-CM | POA: Diagnosis not present

## 2021-10-25 DIAGNOSIS — M779 Enthesopathy, unspecified: Secondary | ICD-10-CM

## 2021-10-25 MED ORDER — TRIAMCINOLONE ACETONIDE 10 MG/ML IJ SUSP
10.0000 mg | Freq: Once | INTRAMUSCULAR | Status: AC
  Administered 2021-10-25: 10 mg

## 2021-10-26 NOTE — Progress Notes (Signed)
Subjective:  ? ?Patient ID: Claudia Howell, female   DOB: 81 y.o.   MRN: [id number]  ? ?HPI ?Patient states she is getting a lot of pain on top of her right foot and its been inflamed and hard to wear shoe gear with.  States is just started recently ? ? ?ROS ? ? ?   ?Objective:  ?Physical Exam  ?Patient neurovascular status intact muscle strength adequate range of motion adequate with inflammation pain around the extensor complex right with localized fluid buildup ? ?   ?Assessment:  ?Acute extensor tendinitis right with inflammation ? ?   ?Plan:  ?Discussed condition reviewed x-rays sterile prep and injected the extensor complex 3 mg dexamethasone Kenalog 5 mg Xylocaine discussed reduced activity before doing this did explain chances for rupture associate with injections that she is aware of and patient will be seen back as needed and can use topical medicines.  Patient is encouraged to call questions concerns which may arise ? 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   ? ? ?

## 2021-10-30 ENCOUNTER — Other Ambulatory Visit: Payer: Self-pay | Admitting: Interventional Cardiology

## 2021-11-08 ENCOUNTER — Encounter: Payer: Self-pay | Admitting: Gastroenterology

## 2021-11-08 ENCOUNTER — Ambulatory Visit (INDEPENDENT_AMBULATORY_CARE_PROVIDER_SITE_OTHER): Payer: Medicare Other | Admitting: Gastroenterology

## 2021-11-08 VITALS — BP 122/68 | HR 67 | Ht 65.0 in | Wt 134.2 lb

## 2021-11-08 DIAGNOSIS — Z86010 Personal history of colonic polyps: Secondary | ICD-10-CM

## 2021-11-08 MED ORDER — SUTAB 1479-225-188 MG PO TABS: 1.0000 | ORAL_TABLET | ORAL | 0 refills | Status: DC

## 2021-11-08 NOTE — Patient Instructions (Addendum)
If you are age 79 or older, your body mass index should be between 23-30. Your Body mass index is 22.34 kg/m?Marland Kitchen If this is out of the aforementioned range listed, please consider follow up with your Primary Care Provider. ? ?If you are age 59 or younger, your body mass index should be between 19-25. Your Body mass index is 22.34 kg/m?Marland Kitchen If this is out of the aformentioned range listed, please consider follow up with your Primary Care Provider.  ? ?________________________________________________________ ? ?The [facility] providers would like to encourage you to use [facility] to communicate with providers for non-urgent requests or questions.  Due to long hold times on the telephone, sending your provider a message by [facility] may be a faster and more efficient way to get a response.  Please allow 48 business hours for a response.  Please remember that this is for non-urgent requests.  ?_______________________________________________________ ? ?You have been scheduled for a colonoscopy. Please follow written instructions given to you at your visit today.  ?Please pick up your prep supplies at the pharmacy within the next 1-3 days. ?If you use inhalers (even only as needed), please bring them with you on the day of your procedure. ? ?We have sent the following medications to your pharmacy for you to pick up at your convenience: ?Sutab. You have been given a coupon as well ? ?You will be contacted by our office prior to your procedure for directions on holding your Plavix.  If you do not hear from our office 2 week prior to your scheduled procedure, please call [phone] to discuss.  ? ? ?Thank you, ? ?Dr. Jackquline Denmark ? ?

## 2021-11-08 NOTE — Progress Notes (Signed)
? ? ?Chief Complaint: for colon ? ?Referring Provider:  Rita Ohara, MD    ? ? ?ASSESSMENT AND PLAN;  ? ?#1.  H/O advanced polyps 2016 ? ?#2.  Multiple comorbidities including dCHF (Nl EF) on farxiga, CAD s/p CABG 2014, HTN, A Fib s/p Maze, on plavix. ? ?Plan: ?-Colon with sutab, after holding plavix 5 days before, and after cardio clearance from Dr Tamala Julian. ? ? ?Discussed risks & benefits of colonoscopy. Risks including rare perforation req laparotomy, bleeding after bx/polypectomy req blood transfusion, rarely missing neoplasms, risks of anesthesia/sedation, rare risk of damage to internal organs. Benefits outweigh the risks. Patient agrees to proceed. All the questions were answered. Pt consents to proceed. ? ?She understands that age-related risks are higher. ?HPI:   ? ?Claudia Howell is a 81 y.o. female  ?LPN ?With dCHF (Nl EF) on farxiga, CAD s/p CABG 2014, HTN, A Fib s/p Maze, on plavix. ? ?Had advanced polyps s/p polypectomy 2016, was told to get it done again in 3 yrs. Didn't get it done d/t Covid/scheduling problems. ? ?She was scheduled for colonoscopy in October 2022 with [facility].  Could not tolerate prep and hence was canceled.  Due to scheduling problems, "they never got back to her". ? ?Would like to get colon done. She is LPN. We had gone over age related recommendations for colorectal cancer screening/surveillance.  Also, there is a question about incomplete resection of ileocecal polyp.  She is doing well medically and likely has over 10 years life expectancy. ? ?Denies having any GI problems except for chronic constipation.  Well controlled with MiraLAX plus senna 5/day. ? ?No nausea, vomiting, heartburn, regurgitation, odynophagia or dysphagia.  No significant diarrhea or constipation.  No melena or hematochezia. No unintentional weight loss. No abdominal pain. ? ?No further shortness of breath on Farxiga. ? ? ?Past GI work-up: ?Colonoscopy 10/2014 (Dr Michail Sermon), PCF ?-4 flat polyps measuring 3 to  14 mm s/p snare polypectomy. Bx- TAs ?-12 mm polyp at ileocecal valve-incomplete resection. Bx-colonic mucosa with coagulation artifact.  Cannot exclude adenomatous changes due to artifact. ?-2 mm polyp in the sigmoid colon s/p polypectomy ?-Sig Diverticulosis ?-Melanosis ?-Internal hemorrhoids. ? ? ? ?Past Medical History:  ?Diagnosis Date  ? Adenomatous colon polyp 10/09  ? CHF (congestive heart failure) ([place])   ? Constipation   ? Coronary artery disease   ? Last cath 12/14 Cath 12/8 100% SVG occlusion to RCA, 95% SVG.  The stenosis to OM.  LIMA to LAD patent but diffuse disease in LAD after graft.  Overlapping stents mid/distal SVG to OM.  ? Decubitus ulcer of coccyx 10/22/12  ? 1/2 inch raw open area on coccyx  ? Hyperlipidemia   ? Hypertension   ? Dr.Henry Tamala Julian  ? Osteopenia 9/09  ? PAF (paroxysmal atrial fibrillation) ([place])   ? during cath/notes 10/16/2012  ? Right posterior capsular opacification 11/16/2019  ? S/P CABG x 3 10/22/2012  ? LIMA to LAD, SVG to distal RCA, SVG to OM, EVH from right thigh  ? S/P Maze operation for atrial fibrillation 10/22/2012  ? Left side lesion set using bipolar radiofrequency and cryothermy ablation with clipping of LA appendage  ? 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  ? ? ?Past Surgical History:  ?Procedure Laterality Date  ? BREAST CYST EXCISION Left 1980-1990's  ? x 3  ? CARDIAC CATHETERIZATION  10/16/2012  ? CATARACT EXTRACTION, BILATERAL Bilateral   ? 03/2015 and 05/2015  ? CATARACT EXTRACTION, BILATERAL Bilateral approx 2016  ?  CORONARY ANGIOPLASTY WITH STENT PLACEMENT  04/01/2013  ? "1" (06/01/2013)  ? CORONARY ARTERY BYPASS GRAFT N/A 10/22/2012  ? Procedure: CORONARY ARTERY BYPASS GRAFTING (CABG);  Surgeon: Rexene Alberts, MD;  Location: [place];  Service: Open Heart Surgery;  Laterality: N/A;  ? CORONARY ATHERECTOMY N/A 04/29/2019  ? Procedure: CORONARY ATHERECTOMY;  Surgeon: Martinique, Peter M, MD;  Location: [place] [facility];  Service: Cardiovascular;  Laterality: N/A;   ? CORONARY CTO INTERVENTION N/A 04/29/2019  ? Procedure: CORONARY CTO INTERVENTION;  Surgeon: Martinique, Peter M, MD;  Location: [place] [facility];  Service: Cardiovascular;  Laterality: N/A;  ? CORONARY STENT INTERVENTION N/A 01/01/2019  ? Procedure: CORONARY STENT INTERVENTION;  Surgeon: Belva Crome, MD;  Location: [place] [facility];  Service: Cardiovascular;  Laterality: N/A;  ? DILATION AND CURETTAGE OF UTERUS    ? INTRAOPERATIVE TRANSESOPHAGEAL ECHOCARDIOGRAM N/A 10/22/2012  ? Procedure: INTRAOPERATIVE TRANSESOPHAGEAL ECHOCARDIOGRAM;  Surgeon: Rexene Alberts, MD;  Location: [place];  Service: Open Heart Surgery;  Laterality: N/A;  ? LEFT HEART CATH AND CORS/GRAFTS ANGIOGRAPHY N/A 01/01/2019  ? Procedure: LEFT HEART CATH AND CORS/GRAFTS ANGIOGRAPHY;  Surgeon: Belva Crome, MD;  Location: [place] [facility];  Service: Cardiovascular;  Laterality: N/A;  ? LEFT HEART CATH AND CORS/GRAFTS ANGIOGRAPHY N/A 04/28/2019  ? Procedure: LEFT HEART CATH AND CORS/GRAFTS ANGIOGRAPHY;  Surgeon: Belva Crome, MD;  Location: [place] [facility];  Service: Cardiovascular;  Laterality: N/A;  ? LEFT HEART CATHETERIZATION WITH CORONARY ANGIOGRAM N/A 10/16/2012  ? Procedure: LEFT HEART CATHETERIZATION WITH CORONARY ANGIOGRAM;  Surgeon: Sinclair Grooms, MD;  Location: [facility] CATH [facility];  Service: Cardiovascular;  Laterality: N/A;  ? LEFT HEART CATHETERIZATION WITH CORONARY/GRAFT ANGIOGRAM N/A 06/01/2013  ? Procedure: LEFT HEART CATHETERIZATION WITH Beatrix Fetters;  Surgeon: Sinclair Grooms, MD;  Location: [facility] CATH LAB;  Service: Cardiovascular;  Laterality: N/A;  ? MAZE N/A 10/22/2012  ? Procedure: MAZE;  Surgeon: Rexene Alberts, MD;  Location: [place];  Service: Open Heart Surgery;  Laterality: N/A;  ? PERCUTANEOUS CORONARY STENT INTERVENTION (PCI-S)  06/01/2013  ? Procedure: PERCUTANEOUS CORONARY STENT INTERVENTION (PCI-S);  Surgeon: Sinclair Grooms, MD;  Location: [facility] CATH LAB;  Service: Cardiovascular;;  ? TONSILLECTOMY  1949  ?  TOTAL ABDOMINAL HYSTERECTOMY W/ BILATERAL SALPINGOOPHORECTOMY  1990  ? benign growth  ? ? ?Family History  ?Problem Relation Age of Onset  ? Hypertension Mother   ? Heart attack Mother 70  ? Hypertension Brother   ? Cancer Brother   ?     prostate (76), gall bladder (dx 86)  ? Heart disease Brother 42  ?     stent  ? Prostate cancer Brother   ? Heart disease Other   ?     x2 (age 38 and 38)  ? Diabetes Neg Hx   ? Breast cancer Neg Hx   ? Colon cancer Neg Hx   ? Stomach cancer Neg Hx   ? Rectal cancer Neg Hx   ? Liver cancer Neg Hx   ? Pancreatic cancer Neg Hx   ? Esophageal cancer Neg Hx   ? ? ?Social History  ? ?Tobacco Use  ? Smoking status: Never  ? Smokeless tobacco: Never  ?Vaping Use  ? Vaping Use: Never used  ?Substance Use Topics  ? Alcohol use: Yes  ?  Alcohol/week: 2.0 standard drinks  ?  Types: 2 Glasses of wine per week  ?  Comment: 0-2 glasses of  wine/week  ? Drug use: No  ? ? ?Current Outpatient Medications  ?Medication Sig Dispense Refill  ? atenolol (TENORMIN) 100 MG tablet TAKE 1 TABLET(100 MG) BY MOUTH DAILY 90 tablet 3  ? atorvastatin (LIPITOR) 10 MG tablet Take 1 tablet (10 mg total) by mouth daily. 90 tablet 3  ? benazepril (LOTENSIN) 40 MG tablet Take 1 tablet (40 mg total) by mouth daily. Please keep upcoming appt in March 2023 with Dr. Tamala Julian before anymore refills. Thank you 90 tablet 0  ? cholecalciferol (VITAMIN D) 1000 UNITS tablet Take 1,000 Units by mouth daily.    ? clopidogrel (PLAVIX) 75 MG tablet TAKE 1 TABLET(75 MG) BY MOUTH DAILY WITH BREAKFAST 90 tablet 3  ? Coenzyme Q10 (COQ10 PO) Take 1 capsule by mouth daily.    ? dapagliflozin propanediol (FARXIGA) 10 MG TABS tablet Take 1 tablet (10 mg total) by mouth daily before breakfast. 30 tablet 11  ? furosemide (LASIX) 20 MG tablet Take 1 tablet (20 mg total) by mouth as needed for fluid or edema. 30 tablet 3  ? GLUCOSAMINE-CHONDROITIN-VIT D3 PO Take 1 tablet by mouth every other day.    ? hydrochlorothiazide (HYDRODIURIL) 25 MG tablet  TAKE 1 TABLET BY MOUTH DAILY 90 tablet 3  ? Multiple Vitamin (MULTIVITAMIN) tablet Take 1 tablet by mouth daily.    ? nitroGLYCERIN (NITROSTAT) 0.4 MG SL tablet Place 1 tablet (0.4 mg total) under the

## 2021-11-21 ENCOUNTER — Other Ambulatory Visit: Payer: Self-pay | Admitting: Interventional Cardiology

## 2021-12-06 ENCOUNTER — Ambulatory Visit (AMBULATORY_SURGERY_CENTER): Payer: Medicare Other | Admitting: Gastroenterology

## 2021-12-06 ENCOUNTER — Encounter: Payer: Self-pay | Admitting: Gastroenterology

## 2021-12-06 VITALS — BP 104/60 | HR 64 | Temp 97.1°F | Resp 15 | Ht 65.0 in | Wt 134.0 lb

## 2021-12-06 DIAGNOSIS — D120 Benign neoplasm of cecum: Secondary | ICD-10-CM | POA: Diagnosis not present

## 2021-12-06 DIAGNOSIS — Z86010 Personal history of colonic polyps: Secondary | ICD-10-CM

## 2021-12-06 DIAGNOSIS — Z09 Encounter for follow-up examination after completed treatment for conditions other than malignant neoplasm: Secondary | ICD-10-CM

## 2021-12-06 MED ORDER — SODIUM CHLORIDE 0.9 % IV SOLN: 500.0000 mL | Freq: Once | INTRAVENOUS | Status: DC

## 2021-12-06 NOTE — Op Note (Signed)
[place] Patient Name: Claudia Howell Procedure Date: 12/06/2021 3:50 PM MRN: [id number] Endoscopist: Jackquline Denmark , MD Age: 81 Referring MD:  Date of Birth: 03-10-1941 Gender: Female Account #: [id number] Procedure:                Colonoscopy Indications:              High risk colon cancer surveillance: Personal                            history of colonic polyps Medicines:                Monitored Anesthesia Care Procedure:                Pre-Anesthesia Assessment:                           - Prior to the procedure, a History and Physical                            was performed, and patient medications and                            allergies were reviewed. The patient's tolerance of                            previous anesthesia was also reviewed. The risks                            and benefits of the procedure and the sedation                            options and risks were discussed with the patient.                            All questions were answered, and informed consent                            was obtained. Prior Anticoagulants: Plavix last                            dose 6/12. ASA Grade Assessment: III - A patient                            with severe systemic disease. After reviewing the                            risks and benefits, the patient was deemed in                            satisfactory condition to undergo the procedure.                            Patient with history of chest pain requiring NTG.  Preoperative EKG did not show any changes as                            compared to previous EKG.                           After obtaining informed consent, the colonoscope                            was passed under direct vision. Throughout the                            procedure, the patient's blood pressure, pulse, and                            oxygen saturations were monitored continuously. The                             Olympus PCF-H190DL (#[id number]) Colonoscope was                            introduced through the anus and advanced to the 2                            cm into the ileum. The colonoscopy was performed                            without difficulty. The patient tolerated the                            procedure well. The quality of the bowel                            preparation was good. The terminal ileum, ileocecal                            valve, appendiceal orifice, and rectum were                            photographed. Scope In: 3:58:48 PM Scope Out: 4:22:49 PM Scope Withdrawal Time: 0 hours 17 minutes 8 seconds  Total Procedure Duration: 0 hours 24 minutes 1 second  Findings:                 Two sessile polyps were found in the cecum (smaller                            polyp on IV valve). The polyps were 4 to 6 mm in                            size. These polyps were removed with a cold snare.                            Resection and retrieval were complete.  A diffuse area of mild melanosis was found in the                            entire colon. The colon was highly redundant.                           A few medium-mouthed diverticula were found in the                            sigmoid colon.                           Non-bleeding internal hemorrhoids were found during                            retroflexion. The hemorrhoids were small and Grade                            I (internal hemorrhoids that do not prolapse).                            Rectal exam did reveal minor degree flexion                            stenosis.                           The terminal ileum appeared normal.                           The exam was otherwise without abnormality on                            direct and retroflexion views. Complications:            No immediate complications. Estimated Blood Loss:     Estimated blood loss: none. Impression:                - Two 4 to 6 mm polyps in the cecum, removed with a                            cold snare. Resected and retrieved.                           - Mild Melanosis in the colon.                           - Moderate sigmoid diverticulosis.                           - Non-bleeding internal hemorrhoids.                           - The examined portion of the ileum was normal.                           -  The examination was otherwise normal on direct                            and retroflexion views. Recommendation:           - Patient has a contact number available for                            emergencies. The signs and symptoms of potential                            delayed complications were discussed with the                            patient. Return to normal activities tomorrow.                            Written discharge instructions were provided to the                            patient.                           - Resume previous diet.                           - Continue present medications.                           - Await pathology results.                           - Resume Plavix (clopidogrel) at prior dose                            tomorrow.                           - The findings and recommendations were discussed                            with the patient's family. Jackquline Denmark, MD 12/06/2021 4:30:13 PM This report has been signed electronically.

## 2021-12-06 NOTE — Progress Notes (Signed)
Chief Complaint: for colon  Referring Provider:  Rita Ohara, MD      ASSESSMENT AND PLAN;   #1.  H/O advanced polyps 2016  #2.  Multiple comorbidities including dCHF (Nl EF) on farxiga, CAD s/p CABG 2014, HTN, A Fib s/p Maze, on plavix.  Plan: -Colon with sutab, after holding plavix 5 days before, and after cardio clearance from Dr Tamala Julian.   Discussed risks & benefits of colonoscopy. Risks including rare perforation req laparotomy, bleeding after bx/polypectomy req blood transfusion, rarely missing neoplasms, risks of anesthesia/sedation, rare risk of damage to internal organs. Benefits outweigh the risks. Patient agrees to proceed. All the questions were answered. Pt consents to proceed.  She understands that age-related risks are higher. HPI:    Claudia Howell is a 81 y.o. female  LPN With dCHF (Nl EF) on farxiga, CAD s/p CABG 2014, HTN, A Fib s/p Maze, on plavix.  Had advanced polyps s/p polypectomy 2016, was told to get it done again in 3 yrs. Didn't get it done d/t Covid/scheduling problems.  She was scheduled for colonoscopy in October 2022 with [facility].  Could not tolerate prep and hence was canceled.  Due to scheduling problems, "they never got back to her".  Would like to get colon done. She is LPN. We had gone over age related recommendations for colorectal cancer screening/surveillance.  Also, there is a question about incomplete resection of ileocecal polyp.  She is doing well medically and likely has over 10 years life expectancy.  Denies having any GI problems except for chronic constipation.  Well controlled with MiraLAX plus senna 5/day.  No nausea, vomiting, heartburn, regurgitation, odynophagia or dysphagia.  No significant diarrhea or constipation.  No melena or hematochezia. No unintentional weight loss. No abdominal pain.  No further shortness of breath on Farxiga.   Past GI work-up: Colonoscopy 10/2014 (Dr Michail Sermon), PCF -4 flat polyps measuring 3 to  14 mm s/p snare polypectomy. Bx- TAs -12 mm polyp at ileocecal valve-incomplete resection. Bx-colonic mucosa with coagulation artifact.  Cannot exclude adenomatous changes due to artifact. -2 mm polyp in the sigmoid colon s/p polypectomy -Sig Diverticulosis -Melanosis -Internal hemorrhoids.    Past Medical History:  Diagnosis Date   Adenomatous colon polyp 03/2008   CHF (congestive heart failure) (HCC)    Constipation    Coronary artery disease    Last cath 12/14 Cath 12/8 100% SVG occlusion to RCA, 95% SVG.  The stenosis to OM.  LIMA to LAD patent but diffuse disease in LAD after graft.  Overlapping stents mid/distal SVG to OM.   Decubitus ulcer of coccyx 10/22/2012   1/2 inch raw open area on coccyx   Hyperlipidemia    Hypertension    Dr.Henry Tamala Julian   Myocardial infarction [facility])    Osteopenia 02/2008   PAF (paroxysmal atrial fibrillation) ([place])    during cath/notes 10/16/2012   Right posterior capsular opacification 11/16/2019   S/P CABG x 3 10/22/2012   LIMA to LAD, SVG to distal RCA, SVG to OM, EVH from right thigh   S/P Maze operation for atrial fibrillation 10/22/2012   Left side lesion set using bipolar radiofrequency and cryothermy ablation with clipping of LA appendage   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    Past Surgical History:  Procedure Laterality Date   BREAST CYST EXCISION Left 1980-1990's   x 3   CARDIAC CATHETERIZATION  10/16/2012   CATARACT EXTRACTION, BILATERAL Bilateral    03/2015 and 05/2015   CATARACT  EXTRACTION, BILATERAL Bilateral approx 2016   CORONARY ANGIOPLASTY WITH STENT PLACEMENT  04/01/2013   "1" (06/01/2013)   CORONARY ARTERY BYPASS GRAFT N/A 10/22/2012   Procedure: CORONARY ARTERY BYPASS GRAFTING (CABG);  Surgeon: Rexene Alberts, MD;  Location: [place];  Service: Open Heart Surgery;  Laterality: N/A;   CORONARY ATHERECTOMY N/A 04/29/2019   Procedure: CORONARY ATHERECTOMY;  Surgeon: Martinique, Peter M, MD;  Location: [place] CV LAB;   Service: Cardiovascular;  Laterality: N/A;   CORONARY CTO INTERVENTION N/A 04/29/2019   Procedure: CORONARY CTO INTERVENTION;  Surgeon: Martinique, Peter M, MD;  Location: [place] CV LAB;  Service: Cardiovascular;  Laterality: N/A;   CORONARY STENT INTERVENTION N/A 01/01/2019   Procedure: CORONARY STENT INTERVENTION;  Surgeon: Belva Crome, MD;  Location: [place] [facility];  Service: Cardiovascular;  Laterality: N/A;   DILATION AND CURETTAGE OF UTERUS     INTRAOPERATIVE TRANSESOPHAGEAL ECHOCARDIOGRAM N/A 10/22/2012   Procedure: INTRAOPERATIVE TRANSESOPHAGEAL ECHOCARDIOGRAM;  Surgeon: Rexene Alberts, MD;  Location: [place];  Service: Open Heart Surgery;  Laterality: N/A;   LEFT HEART CATH AND CORS/GRAFTS ANGIOGRAPHY N/A 01/01/2019   Procedure: LEFT HEART CATH AND CORS/GRAFTS ANGIOGRAPHY;  Surgeon: Belva Crome, MD;  Location: [place] [facility];  Service: Cardiovascular;  Laterality: N/A;   LEFT HEART CATH AND CORS/GRAFTS ANGIOGRAPHY N/A 04/28/2019   Procedure: LEFT HEART CATH AND CORS/GRAFTS ANGIOGRAPHY;  Surgeon: Belva Crome, MD;  Location: [place] [facility];  Service: Cardiovascular;  Laterality: N/A;   LEFT HEART CATHETERIZATION WITH CORONARY ANGIOGRAM N/A 10/16/2012   Procedure: LEFT HEART CATHETERIZATION WITH CORONARY ANGIOGRAM;  Surgeon: Sinclair Grooms, MD;  Location[facility];  Service: Cardiovascular;  Laterality: N/A;   LEFT HEART CATHETERIZATION WITH CORONARY/GRAFT ANGIOGRAM N/A 06/01/2013   Procedure: LEFT HEART CATHETERIZATION WITH Beatrix Fetters;  Surgeon: Sinclair Grooms, MD;  Location: [facility] CATH LAB;  Service: Cardiovascular;  Laterality: N/A;   MAZE N/A 10/22/2012   Procedure: MAZE;  Surgeon: Rexene Alberts, MD;  Location: [place];  Service: Open Heart Surgery;  Laterality: N/A;   PERCUTANEOUS CORONARY STENT INTERVENTION (PCI-S)  06/01/2013   Procedure: PERCUTANEOUS CORONARY STENT INTERVENTION (PCI-S);  Surgeon: Sinclair Grooms, MD;  Location: [place] CATH LAB;  Service:  Cardiovascular;;   TONSILLECTOMY  1949   TOTAL ABDOMINAL HYSTERECTOMY W/ BILATERAL SALPINGOOPHORECTOMY  1990   benign growth    Family History  Problem Relation Age of Onset   Hypertension Mother    Heart attack Mother 79   Hypertension Brother    Cancer Brother        prostate (76), gall bladder (dx 38)   Heart disease Brother 79       stent   Prostate cancer Brother    Heart disease Other        x2 (age 46 and 35)   Diabetes Neg Hx    Breast cancer Neg Hx    Colon cancer Neg Hx    Stomach cancer Neg Hx    Rectal cancer Neg Hx    Liver cancer Neg Hx    Pancreatic cancer Neg Hx    Esophageal cancer Neg Hx     Social History   Tobacco Use   Smoking status: Never   Smokeless tobacco: Never  Vaping Use   Vaping Use: Never used  Substance Use Topics   Alcohol use: Yes    Alcohol/week: 2.0 standard drinks of alcohol    Types: 2 Glasses of wine  per week    Comment: 0-2 glasses of wine/week   Drug use: No    Current Outpatient Medications  Medication Sig Dispense Refill   atenolol (TENORMIN) 100 MG tablet TAKE 1 TABLET(100 MG) BY MOUTH DAILY 90 tablet 3   benazepril (LOTENSIN) 40 MG tablet TAKE 1 TABLET BY MOUTH DAILY 90 tablet 3   cholecalciferol (VITAMIN D) 1000 UNITS tablet Take 1,000 Units by mouth daily.     Coenzyme Q10 (COQ10 PO) Take 1 capsule by mouth daily.     GLUCOSAMINE-CHONDROITIN-VIT D3 PO Take 1 tablet by mouth every other day.     hydrochlorothiazide (HYDRODIURIL) 25 MG tablet TAKE 1 TABLET BY MOUTH DAILY 90 tablet 3   Multiple Vitamin (MULTIVITAMIN) tablet Take 1 tablet by mouth daily.     polyethylene glycol (MIRALAX / GLYCOLAX) 17 g packet Take 17 g by mouth daily.     Potassium 99 MG TABS Take 1 tablet by mouth daily.     atorvastatin (LIPITOR) 10 MG tablet Take 1 tablet (10 mg total) by mouth daily. 90 tablet 3   clopidogrel (PLAVIX) 75 MG tablet TAKE 1 TABLET(75 MG) BY MOUTH DAILY WITH BREAKFAST 90 tablet 3   dapagliflozin propanediol (FARXIGA)  10 MG TABS tablet Take 1 tablet (10 mg total) by mouth daily before breakfast. (Patient not taking: Reported on 12/06/2021) 30 tablet 11   furosemide (LASIX) 20 MG tablet Take 1 tablet (20 mg total) by mouth as needed for fluid or edema. 30 tablet 3   nitroGLYCERIN (NITROSTAT) 0.4 MG SL tablet Place 1 tablet (0.4 mg total) under the tongue every 5 (five) minutes as needed for chest pain. 25 tablet 6   Current Facility-Administered Medications  Medication Dose Route Frequency Provider Last Rate Last Admin   0.9 %  sodium chloride infusion  500 mL Intravenous Once Jackquline Denmark, MD        Allergies  Allergen Reactions   Contrast Media [Iodinated Contrast Media] Hives   Pravastatin     Other reaction(s): muscle aches   Simvastatin     Other reaction(s): cramps   Tramadol Hcl     Other reaction(s): vomiting   Zetia [Ezetimibe] Other (See Comments)    Muscle cramps   Oxycodone Nausea And Vomiting and Other (See Comments)    Extreme nausea and vomiting   Repatha [Evolocumab] Other (See Comments)    Muscle cramps    Review of Systems:  Constitutional: Denies fever, chills, diaphoresis, appetite change and fatigue.  HEENT: Denies photophobia, eye pain, redness, hearing loss, ear pain, congestion, sore throat, rhinorrhea, sneezing, mouth sores, neck pain, neck stiffness and tinnitus.   Respiratory: Denies SOB, DOE, cough, chest tightness,  and wheezing.   Cardiovascular: Denies chest pain, palpitations and leg swelling.  Genitourinary: Denies dysuria, urgency, frequency, hematuria, flank pain and difficulty urinating.  Musculoskeletal: Denies myalgias, back pain, joint swelling, arthralgias and gait problem.  Skin: No rash.  Neurological: Denies dizziness, seizures, syncope, weakness, light-headedness, numbness and headaches.  Hematological: Denies adenopathy. Easy bruising, personal or family bleeding history  Psychiatric/Behavioral: No anxiety or depression     Physical Exam:    BP  (!) 147/74   Pulse 86   Temp (!) 97.1 F (36.2 C)   Ht '5\' 5"'$  (1.651 m)   Wt 134 lb (60.8 kg)   SpO2 99%   BMI 22.30 kg/m  Wt Readings from Last 3 Encounters:  12/06/21 134 lb (60.8 kg)  11/08/21 134 lb 4 oz (60.9 kg)  10/23/21 134  lb (60.8 kg)   Constitutional:  Well-developed, in no acute distress. Psychiatric: Normal mood and affect. Behavior is normal. HEENT: Conjunctivae are normal. No scleral icterus. Cardiovascular: Normal rate, regular rhythm. No edema Pulmonary/chest: Effort normal and breath sounds normal. No wheezing, rales or rhonchi. Abdominal: Soft, nondistended. Nontender. Bowel sounds active throughout. There are no masses palpable. No hepatomegaly. Small umbilical hernia. Rectal: Deferred Neurological: Alert and oriented to person place and time. Skin: Skin is warm and dry. No rashes noted.  Data Reviewed: I have personally reviewed following labs and imaging studies  CBC:    Latest Ref Rng & Units 05/11/2021    4:21 PM 05/09/2020    3:58 PM 04/30/2019    3:07 AM  CBC  WBC 3.4 - 10.8 x10E3/uL 8.5  9.4  13.3   Hemoglobin 11.1 - 15.9 g/dL 13.4  13.7  10.1   Hematocrit 34.0 - 46.6 % 40.3  40.0  30.1   Platelets 150 - 450 x10E3/uL 207  219  193     CMP:    Latest Ref Rng & Units 05/11/2021    4:21 PM 01/05/2021    9:01 AM 05/09/2020    3:58 PM  CMP  Glucose 70 - 99 mg/dL 94   98   BUN 8 - 27 mg/dL 19   14   Creatinine 0.57 - 1.00 mg/dL 0.85   0.86   Sodium 134 - 144 mmol/L 140   139   Potassium 3.5 - 5.2 mmol/L 3.7   3.9   Chloride 96 - 106 mmol/L 102   100   CO2 20 - 29 mmol/L 24   26   Calcium 8.7 - 10.3 mg/dL 9.3   9.5   Total Protein 6.0 - 8.5 g/dL 7.0  6.8  7.5   Total Bilirubin 0.0 - 1.2 mg/dL 0.3  0.6  0.3   Alkaline Phos 44 - 121 IU/L 67  50  55   AST 0 - 40 IU/L '18  22  20   '$ ALT 0 - 32 IU/L '14  15  13     '$ Carmell Austria, MD 12/06/2021, 3:51 PM  Cc: Rita Ohara, MD

## 2021-12-06 NOTE — Progress Notes (Signed)
Called to room to assist during endoscopic procedure.  Patient ID and intended procedure confirmed with present staff. Received instructions for my participation in the procedure from the performing physician.  

## 2021-12-06 NOTE — Patient Instructions (Signed)
YOU HAD AN ENDOSCOPIC PROCEDURE TODAY AT [facility]:   Refer to the procedure report that was given to you for any specific questions about what was found during the examination.  If the procedure report does not answer your questions, please call your gastroenterologist to clarify.  If you requested that your care partner not be given the details of your procedure findings, then the procedure report has been included in a sealed envelope for you to review at your convenience later.  **Handouts given on polyps, diverticulosis and hemorrhoids**  YOU SHOULD EXPECT: Some feelings of bloating in the abdomen. Passage of more gas than usual.  Walking can help get rid of the air that was put into your GI tract during the procedure and reduce the bloating. If you had a lower endoscopy (such as a colonoscopy or flexible sigmoidoscopy) you may notice spotting of blood in your stool or on the toilet paper. If you underwent a bowel prep for your procedure, you may not have a normal bowel movement for a few days.  Please Note:  You might notice some irritation and congestion in your nose or some drainage.  This is from the oxygen used during your procedure.  There is no need for concern and it should clear up in a day or so.  SYMPTOMS TO REPORT IMMEDIATELY:  Following lower endoscopy (colonoscopy or flexible sigmoidoscopy):  Excessive amounts of blood in the stool  Significant tenderness or worsening of abdominal pains  Swelling of the abdomen that is new, acute  Fever of 100F or higher   For urgent or emergent issues, a gastroenterologist can be reached at any hour by calling [phone]. Do not use MyChart messaging for urgent concerns.    DIET:  We do recommend a small meal at first, but then you may proceed to your regular diet.  Drink plenty of fluids but you should avoid alcoholic beverages for 24 hours.  ACTIVITY:  You should plan to take it easy for the rest of today and you  should NOT DRIVE or use heavy machinery until tomorrow (because of the sedation medicines used during the test).    FOLLOW UP: Our staff will call the number listed on your records 24-72 hours following your procedure to check on you and address any questions or concerns that you may have regarding the information given to you following your procedure. If we do not reach you, we will leave a message.  We will attempt to reach you two times.  During this call, we will ask if you have developed any symptoms of COVID 19. If you develop any symptoms (ie: fever, flu-like symptoms, shortness of breath, cough etc.) before then, please call [phone].  If you test positive for Covid 19 in the 2 weeks post procedure, please call and report this information to [place].    If any biopsies were taken you will be contacted by phone or by letter within the next 1-3 weeks.  Please call us at [phone] if you have not heard about the biopsies in 3 weeks.    SIGNATURES/CONFIDENTIALITY: You and/or your care partner have signed paperwork which will be entered into your electronic medical record.  These signatures attest to the fact that that the information above on your After Visit Summary has been reviewed and is understood.  Full responsibility of the confidentiality of this discharge information lies with you and/or your care-partner.

## 2021-12-06 NOTE — Progress Notes (Signed)
Vss nad trans to pacu °

## 2021-12-07 ENCOUNTER — Telehealth: Payer: Self-pay | Admitting: *Deleted

## 2021-12-07 NOTE — Telephone Encounter (Signed)
  Follow up Call-     12/06/2021    3:23 PM  Call back number  Post procedure Call Back phone  # [phone]  Permission to leave phone message Yes     Patient questions:  Do you have a fever, pain , or abdominal swelling? No. Pain Score  0 *  Have you tolerated food without any problems? Yes.    Have you been able to return to your normal activities? Yes.    Do you have any questions about your discharge instructions: Diet   No. Medications  No. Follow up visit  No.  Do you have questions or concerns about your Care? No.  Actions: * If pain score is 4 or above: No action needed, pain <4.

## 2021-12-11 ENCOUNTER — Encounter: Payer: Self-pay | Admitting: Gastroenterology

## 2022-02-07 ENCOUNTER — Ambulatory Visit (INDEPENDENT_AMBULATORY_CARE_PROVIDER_SITE_OTHER): Payer: Medicare Other | Admitting: Family Medicine

## 2022-02-07 ENCOUNTER — Encounter: Payer: Self-pay | Admitting: Family Medicine

## 2022-02-07 VITALS — BP 118/68 | HR 72 | Ht 65.0 in | Wt 136.8 lb

## 2022-02-07 DIAGNOSIS — I25708 Atherosclerosis of coronary artery bypass graft(s), unspecified, with other forms of angina pectoris: Secondary | ICD-10-CM

## 2022-02-07 DIAGNOSIS — H6121 Impacted cerumen, right ear: Secondary | ICD-10-CM | POA: Diagnosis not present

## 2022-02-07 NOTE — Patient Instructions (Signed)
Please don't use q-tips. Use the Debrox to help soften the wax. Let us know if you want referral to ENT to have the wax removed, or if you want [place] to try again.

## 2022-02-07 NOTE — Progress Notes (Signed)
Chief Complaint  Patient presents with   Ear Pain    3 months ago she was cleaning her right ear and she went too far and now her ear is hurting. She is flying in October and wants to make sure it's ok. Having hard time sleeping on that side.     She noticed bleeding after using a Q-tip in her ear 2-3 months ago. She noted bleeding one time after that, the next time she put the hearing aid in the right ear. She has had pain since then, not able to sleep on her right side. She has had worsening pain for the past 2 weeks, hasn't been able to put in her hearing aid for the past week, due to pain. She is afraid of flying in October with her ear hurting like this.   PMH, PSH, SH reviewed  Outpatient Encounter Medications as of 02/07/2022  Medication Sig Note   atenolol (TENORMIN) 100 MG tablet TAKE 1 TABLET(100 MG) BY MOUTH DAILY    atorvastatin (LIPITOR) 10 MG tablet Take 1 tablet (10 mg total) by mouth daily.    benazepril (LOTENSIN) 40 MG tablet TAKE 1 TABLET BY MOUTH DAILY    cholecalciferol (VITAMIN D) 1000 UNITS tablet Take 1,000 Units by mouth daily.    Coenzyme Q10 (COQ10 PO) Take 1 capsule by mouth daily.    furosemide (LASIX) 20 MG tablet Take 1 tablet (20 mg total) by mouth as needed for fluid or edema. 02/07/2022: weekly   GLUCOSAMINE-CHONDROITIN-VIT D3 PO Take 1 tablet by mouth every other day.    hydrochlorothiazide (HYDRODIURIL) 25 MG tablet TAKE 1 TABLET BY MOUTH DAILY    Multiple Vitamin (MULTIVITAMIN) tablet Take 1 tablet by mouth daily.    polyethylene glycol (MIRALAX / GLYCOLAX) 17 g packet Take 17 g by mouth daily. 05/11/2021: Takes 1 capful daily   Potassium 99 MG TABS Take 1 tablet by mouth daily. 10/23/2021: About 3 times weekly   nitroGLYCERIN (NITROSTAT) 0.4 MG SL tablet Place 1 tablet (0.4 mg total) under the tongue every 5 (five) minutes as needed for chest pain. (Patient not taking: Reported on 02/07/2022)    [DISCONTINUED] clopidogrel (PLAVIX) 75 MG tablet TAKE 1  TABLET(75 MG) BY MOUTH DAILY WITH BREAKFAST (Patient not taking: Reported on 02/07/2022)    [DISCONTINUED] dapagliflozin propanediol (FARXIGA) 10 MG TABS tablet Take 1 tablet (10 mg total) by mouth daily before breakfast. (Patient not taking: Reported on 12/06/2021)    No facility-administered encounter medications on file as of 02/07/2022.   Allergies  Allergen Reactions   Contrast Media [Iodinated Contrast Media] Hives   Pravastatin     Other reaction(s): muscle aches   Simvastatin     Other reaction(s): cramps   Tramadol Hcl     Other reaction(s): vomiting   Zetia [Ezetimibe] Other (See Comments)    Muscle cramps   Oxycodone Nausea And Vomiting and Other (See Comments)    Extreme nausea and vomiting   Repatha [Evolocumab] Other (See Comments)    Muscle cramps   ROS:  no fever, chills, URI symptoms, headaches or dizziness, no vertigo. No n/v/d. Constipation is controlled with miralax. R ear pain per HPI.  Denies any decrease in hearing from baseline on the right side.    PHYSICAL EXAM:  BP 118/68   Pulse 72   Ht '5\' 5"'$  (1.651 m)   Wt 136 lb 12.8 oz (62.1 kg)   BMI 22.76 kg/m   Well-appearing, pleasant female, in good spirits, in no distress  HEENT: conjunctiva and sclera are clear, EOMI R ear--no pain with movement of external ear.  TM is filled with cerumen (vs scab as well, dark, unable to tell). No inflammation of the canal L TM and EAC normal OP clear Neck: no lymphadenopathy or mass   ASSESSMENT/PLAN:  Impacted cerumen of right ear  Unable to remove the cerumen (+/- scab) with lavage, despite soaking twice. She will try using Debrox drops.  She can either f/u here to let us re-try, or see ENT. I encouraged ENT referral, due to better equipment/suction. She will let us know. She is aware that she may need to wait 2-3 weeks for the ENT appointment once referred.  Advised of reasons for not using Qtips in her ears.

## 2022-02-12 ENCOUNTER — Encounter: Payer: Self-pay | Admitting: Family Medicine

## 2022-02-12 DIAGNOSIS — H6121 Impacted cerumen, right ear: Secondary | ICD-10-CM

## 2022-02-28 ENCOUNTER — Encounter: Payer: Self-pay | Admitting: Internal Medicine

## 2022-03-08 ENCOUNTER — Other Ambulatory Visit: Payer: Medicare Other

## 2022-03-12 DIAGNOSIS — H3561 Retinal hemorrhage, right eye: Secondary | ICD-10-CM | POA: Diagnosis not present

## 2022-03-12 DIAGNOSIS — H348112 Central retinal vein occlusion, right eye, stable: Secondary | ICD-10-CM | POA: Diagnosis not present

## 2022-03-12 DIAGNOSIS — Z23 Encounter for immunization: Secondary | ICD-10-CM | POA: Diagnosis not present

## 2022-03-13 ENCOUNTER — Other Ambulatory Visit: Payer: Self-pay | Admitting: Interventional Cardiology

## 2022-03-15 ENCOUNTER — Ambulatory Visit (INDEPENDENT_AMBULATORY_CARE_PROVIDER_SITE_OTHER): Payer: Medicare Other | Admitting: Podiatry

## 2022-03-15 ENCOUNTER — Encounter: Payer: Self-pay | Admitting: Podiatry

## 2022-03-15 DIAGNOSIS — M7751 Other enthesopathy of right foot: Secondary | ICD-10-CM | POA: Diagnosis not present

## 2022-03-15 MED ORDER — TRIAMCINOLONE ACETONIDE 10 MG/ML IJ SUSP
10.0000 mg | Freq: Once | INTRAMUSCULAR | Status: AC
  Administered 2022-03-15: 10 mg

## 2022-03-16 NOTE — Progress Notes (Signed)
Subjective:   Patient ID: Claudia Howell, female   DOB: 81 y.o.   MRN: [id number]   HPI Patient states it seems the pain is in a slightly different area and I only got short-term relief but it seems different   ROS      Objective:  Physical Exam  Neurovascular status intact with inflammation that is now more into the sinus tarsi and into the ankle joint versus the extensor complex right     Assessment:  Inflammatory capsulitis of the sinus tarsi right along with ankle inflammation pain     Plan:  H&P reviewed condition went ahead today did sterile prep and injected into the sinus tarsi 3 mg Kenalog 5 mg Xylocaine into the ankle joint.  Patient tolerated well will be seen back as symptoms indicate

## 2022-03-19 ENCOUNTER — Encounter: Payer: Self-pay | Admitting: Interventional Cardiology

## 2022-03-19 ENCOUNTER — Telehealth: Payer: Self-pay | Admitting: Interventional Cardiology

## 2022-03-19 NOTE — Telephone Encounter (Signed)
Spoke with the patient who states that she was woken up Friday night with sharp stomach pain. She states that she had trouble catching her breath. She was nauseous. She states this lasted about one hour. Patient reports that she thought it was angina. She took a nitroglycerin with no relief. She states that she had another episode on Saturday night but it was not as severe. She states that she feels fine today, no other symptoms. She reports over the weekend she had a metallic taste in her mouth and very dry mouth which is unusual for her. She reports recent BP readings of 135/84, 147/87, 125/73 and 127/68. Patient advised to continue monitor symptoms, if stomach pain comes back contact PCP. Will make Dr. Tamala Julian aware for any further advisement.

## 2022-03-19 NOTE — Telephone Encounter (Signed)
Left message for patient to call back  

## 2022-03-19 NOTE — Telephone Encounter (Signed)
Pt returning nurse's call. Please advise

## 2022-03-19 NOTE — Telephone Encounter (Signed)
See MyChart message

## 2022-03-21 ENCOUNTER — Ambulatory Visit: Payer: Medicare Other | Attending: Nurse Practitioner | Admitting: Nurse Practitioner

## 2022-03-21 ENCOUNTER — Encounter: Payer: Self-pay | Admitting: Nurse Practitioner

## 2022-03-21 VITALS — BP 122/62 | HR 63 | Ht 65.0 in | Wt 133.0 lb

## 2022-03-21 DIAGNOSIS — E7800 Pure hypercholesterolemia, unspecified: Secondary | ICD-10-CM | POA: Insufficient documentation

## 2022-03-21 DIAGNOSIS — I5032 Chronic diastolic (congestive) heart failure: Secondary | ICD-10-CM | POA: Diagnosis not present

## 2022-03-21 DIAGNOSIS — I25708 Atherosclerosis of coronary artery bypass graft(s), unspecified, with other forms of angina pectoris: Secondary | ICD-10-CM | POA: Diagnosis not present

## 2022-03-21 DIAGNOSIS — R0609 Other forms of dyspnea: Secondary | ICD-10-CM | POA: Insufficient documentation

## 2022-03-21 MED ORDER — FUROSEMIDE 20 MG PO TABS: 20.0000 mg | ORAL_TABLET | Freq: Every day | ORAL | 3 refills | Status: DC

## 2022-03-21 NOTE — Telephone Encounter (Signed)
Spoke with the patient and she has not reached out to her PCP. Made patient an appointment to be seen today per Dr. Tamala Julian.

## 2022-03-21 NOTE — Patient Instructions (Signed)
Medication Instructions Increase lasix to 40 mg for 2 days then back to 20 mg daily  *If you need a refill on your cardiac medications before your next appointment, please call your pharmacy*   Lab Work: [place] in one week   If you have labs (blood work) drawn today and your tests are completely normal, you will receive your results only by: [place] (if you have MyChart) OR A paper copy in the mail If you have any lab test that is abnormal or we need to change your treatment, we will call you to review the results.   Testing/Procedures: Your physician has requested that you have a lexiscan myoview. For further information please visit [email or web address]. Please follow instruction sheet, as given.  Your physician has requested that you have an echocardiogram. Echocardiography is a painless test that uses sound waves to create images of your heart. It provides your doctor with information about the size and shape of your heart and how well your heart's chambers and valves are working. This procedure takes approximately one hour. There are no restrictions for this procedure.     Follow-Up: At [facility], you and your health needs are our priority.  As part of our continuing mission to provide you with exceptional heart care, we have created designated Provider Care Teams.  These Care Teams include your primary Cardiologist (physician) and Advanced Practice Providers (APPs -  Physician Assistants and Nurse Practitioners) who all work together to provide you with the care you need, when you need it.  We recommend signing up for the patient portal called "MyChart".  Sign up information is provided on this After Visit Summary.  MyChart is used to connect with patients for Virtual Visits (Telemedicine).  Patients are able to view lab/test results, encounter notes, upcoming appointments, etc.  Non-urgent messages can be sent to your provider as well.   To learn more about what you  can do with MyChart, go to [email or web address].    Your next appointment:   6 week(s)  The format for your next appointment:   In Person  Provider:  Just after her Latta, NP         Other Instructions   Important Information About Sugar

## 2022-03-21 NOTE — Progress Notes (Signed)
Office Visit    Patient Name: Claudia Howell Date of Encounter: 03/21/2022  Primary Care Provider:  Rita Ohara, MD Primary Cardiologist:  Sinclair Grooms, MD Primary Electrophysiologist: None  Chief Complaint    Claudia Howell is a 81 y.o. female with PMH of CAD s/p CABG x 3 06/2012 with maze, PCI to SVG in 2014, NSTEMI 04/2019 treated with RCA 2 CTO with a stent,HTN, HLD chronic combined systolic and diastolic CHF, paroxysmal AF,  who presents today for complaint of abdominal and chest discomfort.  Past Medical History    Past Medical History:  Diagnosis Date   Adenomatous colon polyp 03/2008   CHF (congestive heart failure) (HCC)    Constipation    Coronary artery disease    Last cath 12/14 Cath 12/8 100% SVG occlusion to RCA, 95% SVG.  The stenosis to OM.  LIMA to LAD patent but diffuse disease in LAD after graft.  Overlapping stents mid/distal SVG to OM.   Decubitus ulcer of coccyx 10/22/2012   1/2 inch raw open area on coccyx   Hyperlipidemia    Hypertension    Dr.Henry Tamala Julian   Myocardial infarction [facility])    Osteopenia 02/2008   PAF (paroxysmal atrial fibrillation) ([place])    during cath/notes 10/16/2012   Right posterior capsular opacification 11/16/2019   S/P CABG x 3 10/22/2012   LIMA to LAD, SVG to distal RCA, SVG to OM, EVH from right thigh   S/P Maze operation for atrial fibrillation 10/22/2012   Left side lesion set using bipolar radiofrequency and cryothermy ablation with clipping of LA appendage   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   Past Surgical History:  Procedure Laterality Date   BREAST CYST EXCISION Left 1980-1990's   x 3   CARDIAC CATHETERIZATION  10/16/2012   CATARACT EXTRACTION, BILATERAL Bilateral    03/2015 and 05/2015   CATARACT EXTRACTION, BILATERAL Bilateral approx 2016   CORONARY ANGIOPLASTY WITH STENT PLACEMENT  04/01/2013   "1" (06/01/2013)   CORONARY ARTERY BYPASS GRAFT N/A 10/22/2012   Procedure: CORONARY ARTERY BYPASS  GRAFTING (CABG);  Surgeon: Rexene Alberts, MD;  Location: [place];  Service: Open Heart Surgery;  Laterality: N/A;   CORONARY ATHERECTOMY N/A 04/29/2019   Procedure: CORONARY ATHERECTOMY;  Surgeon: Martinique, Peter M, MD;  Location: [place] CV LAB;  Service: Cardiovascular;  Laterality: N/A;   CORONARY CTO INTERVENTION N/A 04/29/2019   Procedure: CORONARY CTO INTERVENTION;  Surgeon: Martinique, Peter M, MD;  Location: [place] CV [facility];  Service: Cardiovascular;  Laterality: N/A;   CORONARY STENT INTERVENTION N/A 01/01/2019   Procedure: CORONARY STENT INTERVENTION;  Surgeon: Belva Crome, MD;  Location: [place] CV LAB;  Service: Cardiovascular;  Laterality: N/A;   DILATION AND CURETTAGE OF UTERUS     INTRAOPERATIVE TRANSESOPHAGEAL ECHOCARDIOGRAM N/A 10/22/2012   Procedure: INTRAOPERATIVE TRANSESOPHAGEAL ECHOCARDIOGRAM;  Surgeon: Rexene Alberts, MD;  Location: [place];  Service: Open Heart Surgery;  Laterality: N/A;   LEFT HEART CATH AND CORS/GRAFTS ANGIOGRAPHY N/A 01/01/2019   Procedure: LEFT HEART CATH AND CORS/GRAFTS ANGIOGRAPHY;  Surgeon: Belva Crome, MD;  Location: [place] CV LAB;  Service: Cardiovascular;  Laterality: N/A;   LEFT HEART CATH AND CORS/GRAFTS ANGIOGRAPHY N/A 04/28/2019   Procedure: LEFT HEART CATH AND CORS/GRAFTS ANGIOGRAPHY;  Surgeon: Belva Crome, MD;  Location: [place] CV LAB;  Service: Cardiovascular;  Laterality: N/A;   LEFT HEART CATHETERIZATION WITH CORONARY ANGIOGRAM N/A 10/16/2012   Procedure: LEFT HEART CATHETERIZATION  WITH CORONARY ANGIOGRAM;  Surgeon: Sinclair Grooms, MD;  Location: [facility] CATH LAB;  Service: Cardiovascular;  Laterality: N/A;   LEFT HEART CATHETERIZATION WITH CORONARY/GRAFT ANGIOGRAM N/A 06/01/2013   Procedure: LEFT HEART CATHETERIZATION WITH Beatrix Fetters;  Surgeon: Sinclair Grooms, MD;  Location: [facility] CATH LAB;  Service: Cardiovascular;  Laterality: N/A;   MAZE N/A 10/22/2012   Procedure: MAZE;  Surgeon: Rexene Alberts, MD;  Location:  [place];  Service: Open Heart Surgery;  Laterality: N/A;   PERCUTANEOUS CORONARY STENT INTERVENTION (PCI-S)  06/01/2013   Procedure: PERCUTANEOUS CORONARY STENT INTERVENTION (PCI-S);  Surgeon: Sinclair Grooms, MD;  Location: [facility] CATH LAB;  Service: Cardiovascular;;   TONSILLECTOMY  1949   TOTAL ABDOMINAL HYSTERECTOMY W/ BILATERAL SALPINGOOPHORECTOMY  1990   benign growth    Allergies  Allergies  Allergen Reactions   Contrast Media [Iodinated Contrast Media] Hives   Pravastatin     Other reaction(s): muscle aches   Simvastatin     Other reaction(s): cramps   Tramadol Hcl     Other reaction(s): vomiting   Zetia [Ezetimibe] Other (See Comments)    Muscle cramps   Oxycodone Nausea And Vomiting and Other (See Comments)    Extreme nausea and vomiting   Repatha [Evolocumab] Other (See Comments)    Muscle cramps    History of Present Illness    Claudia Howell  is a 81 year old female with the above mention past medical history who presents today for complaint of abdominal and chest discomfort.  Patient was last seen by Dr. Tamala Julian 09/2021 for follow-up.  She was recently started on Farxiga 08/2021 for complaint of dyspnea on exertion.  Patient was also recommended to start a walking program.  Most recent 2D echo was completed 2020 with EF of 55-60%, grade 2 DD, no RWMA, mildly dilated RA/LA with mild MV regurgitation.  Patient had last ischemic evaluation completed 04/2019 with CTO intervention.  During visit patient had no complaints of angina or dizziness.     Claudia Howell presents today for follow-up of stomach discomfort and possible CHF exacerbation.  Since last being seen in the office patient reports that she experienced sharp pain in her stomach with nausea that was not relieved with nitroglycerin.  She also has increased shortness of breath when lying flat and activity intolerance when walking.  She is also experiencing increased amount of phlegm that she feels may be associated with heart  failure.  She has taken her current medications without any adverse reaction.  Patient elected to stop Farxiga due to leg cramps and pain.  Patient denies chest pain, palpitations, dyspnea, PND, orthopnea, nausea, vomiting, dizziness, syncope, edema, weight gain, or early satiety.   Home Medications    Current Outpatient Medications  Medication Sig Dispense Refill   atenolol (TENORMIN) 100 MG tablet TAKE 1 TABLET(100 MG) BY MOUTH DAILY 90 tablet 3   atorvastatin (LIPITOR) 10 MG tablet Take 1 tablet (10 mg total) by mouth daily. 90 tablet 3   benazepril (LOTENSIN) 40 MG tablet TAKE 1 TABLET BY MOUTH DAILY 90 tablet 3   cholecalciferol (VITAMIN D) 1000 UNITS tablet Take 1,000 Units by mouth daily.     Coenzyme Q10 (COQ10 PO) Take 1 capsule by mouth daily.     furosemide (LASIX) 20 MG tablet Take 1 tablet (20 mg total) by mouth daily. 90 tablet 3   GLUCOSAMINE-CHONDROITIN-VIT D3 PO Take 1 tablet by mouth every other day.     hydrochlorothiazide (HYDRODIURIL)  25 MG tablet TAKE 1 TABLET BY MOUTH DAILY 90 tablet 3   Multiple Vitamin (MULTIVITAMIN) tablet Take 1 tablet by mouth daily.     nitroGLYCERIN (NITROSTAT) 0.4 MG SL tablet PLACE AND DISSOLVE 1 TABLET UNDER THE TONGUE EVERY 5 MINUTE AS NEEDED FOR CHEST PAIN 25 tablet 7   polyethylene glycol (MIRALAX / GLYCOLAX) 17 g packet Take 17 g by mouth daily.     Potassium 99 MG TABS Take 1 tablet by mouth 3 (three) times a week.     No current facility-administered medications for this visit.     Review of Systems  Please see the history of present illness.    (+) Leg cramps (+) Shortness of breath with exertion  All other systems reviewed and are otherwise negative except as noted above.  Physical Exam    Wt Readings from Last 3 Encounters:  03/21/22 133 lb (60.3 kg)  02/07/22 136 lb 12.8 oz (62.1 kg)  12/06/21 134 lb (60.8 kg)   VS: Vitals:   03/21/22 1506  BP: 122/62  Pulse: 63  SpO2: 97%  ,Body mass index is 22.13  kg/m.  Constitutional:      Appearance: Healthy appearance. Not in distress.  Neck:     Vascular: JVD normal.  Pulmonary:     Effort: Pulmonary effort is normal.     Breath sounds: No wheezing. No rales. Diminished in the bases Cardiovascular:     Normal rate. Regular rhythm. Normal S1. Normal S2.      Murmurs: There is no murmur.  Edema:    Peripheral edema absent.  Abdominal:     Palpations: Abdomen is soft non tender. There is no hepatomegaly.  Skin:    General: Skin is warm and dry.  Neurological:     General: No focal deficit present.     Mental Status: Alert and oriented to person, place and time.     Cranial Nerves: Cranial nerves are intact.  EKG/LABS/Other Studies Reviewed    ECG personally reviewed by me today -none completed today Lab Results  Component Value Date   WBC 8.5 05/11/2021   HGB 13.4 05/11/2021   HCT 40.3 05/11/2021   MCV 93 05/11/2021   PLT 207 05/11/2021   Lab Results  Component Value Date   CREATININE 0.85 05/11/2021   BUN 19 05/11/2021   NA 140 05/11/2021   K 3.7 05/11/2021   CL 102 05/11/2021   CO2 24 05/11/2021   Lab Results  Component Value Date   ALT 14 05/11/2021   AST 18 05/11/2021   ALKPHOS 67 05/11/2021   BILITOT 0.3 05/11/2021   Lab Results  Component Value Date   CHOL 151 01/05/2021   HDL 48 01/05/2021   LDLCALC 77 01/05/2021   TRIG 149 01/05/2021   CHOLHDL 3.1 01/05/2021    Lab Results  Component Value Date   HGBA1C 5.5 08/10/2013    Assessment & Plan    1.  Chronic diastolic CHF: -Patient presents today with complaint of shortness of breath with lying flat and abdominal pain. -She is also experiencing increased shortness of breath with walking and physical exertion. -Patient's last 2D echo was completed in 2020 and we will plan to repeat 2D echo to evaluate heart structure and valve function -Patient instructed to increase Lasix to 40 mg for 2 days and then take 20 mg daily due to shortness of breath and  increased phlegm -BMET in 1 week Low sodium diet, fluid restriction <2L, and daily weights encouraged.  Educated to contact our office for weight gain of 2 lbs overnight or 5 lbs in one week.   2.  Coronary artery disease: -CAD s/p CABG x 3 06/2012 with maze, PCI to SVG in 2014, NSTEMI 04/2019 treated with RCA 2 CTO with a stent -Patient has complaint of shortness of breath with exertion -We will repeat Lexiscan Myoview to evaluate for possible ischemic changes related to shortness of breath  3.  Essential hypertension: -Patient's blood pressure today was 122/62 -Continue current hypertensive atenolol 100 mg, HCTZ 25 mg, and Lasix as noted above  4.  Hyperlipidemia: -Continue Lipitor 10 mg   Disposition: Follow-up with Sinclair Grooms, MD or APP in 1 months Shared Decision Making/Informed Consent The risks [chest pain, shortness of breath, cardiac arrhythmias, dizziness, blood pressure fluctuations, myocardial infarction, stroke/transient ischemic attack, nausea, vomiting, allergic reaction, radiation exposure, metallic taste sensation and life-threatening complications (estimated to be 1 in 10,000)], benefits (risk stratification, diagnosing coronary artery disease, treatment guidance) and alternatives of a nuclear stress test were discussed in detail with Claudia Howell and she agrees to proceed.   Medication Adjustments/Labs and Tests Ordered: Current medicines are reviewed at length with the patient today.  Concerns regarding medicines are outlined above.   Signed, Claudia Howell, Marissa Nestle, NP 03/21/2022, 5:03 PM [facility]  Note:  This document was prepared using Dragon voice recognition software and may include unintentional dictation errors.

## 2022-03-24 ENCOUNTER — Encounter: Payer: Self-pay | Admitting: Interventional Cardiology

## 2022-03-29 ENCOUNTER — Ambulatory Visit: Payer: Medicare Other | Attending: Nurse Practitioner

## 2022-03-29 DIAGNOSIS — I25708 Atherosclerosis of coronary artery bypass graft(s), unspecified, with other forms of angina pectoris: Secondary | ICD-10-CM

## 2022-03-29 DIAGNOSIS — I5032 Chronic diastolic (congestive) heart failure: Secondary | ICD-10-CM | POA: Diagnosis not present

## 2022-03-29 DIAGNOSIS — R0609 Other forms of dyspnea: Secondary | ICD-10-CM | POA: Diagnosis not present

## 2022-03-30 ENCOUNTER — Encounter: Payer: Self-pay | Admitting: Interventional Cardiology

## 2022-03-30 LAB — BASIC METABOLIC PANEL
BUN/Creatinine Ratio: 24 (ref 12–28)
BUN: 22 mg/dL (ref 8–27)
CO2: 25 mmol/L (ref 20–29)
Calcium: 9 mg/dL (ref 8.7–10.3)
Chloride: 97 mmol/L (ref 96–106)
Creatinine, Ser: 0.92 mg/dL (ref 0.57–1.00)
Glucose: 105 mg/dL — ABNORMAL HIGH (ref 70–99)
Potassium: 3.6 mmol/L (ref 3.5–5.2)
Sodium: 136 mmol/L (ref 134–144)
eGFR: 63 mL/min/{1.73_m2} (ref >59)

## 2022-04-03 ENCOUNTER — Encounter: Payer: Self-pay | Admitting: Internal Medicine

## 2022-04-04 ENCOUNTER — Telehealth (HOSPITAL_COMMUNITY): Payer: Self-pay | Admitting: *Deleted

## 2022-04-04 ENCOUNTER — Encounter (HOSPITAL_COMMUNITY): Payer: Self-pay | Admitting: *Deleted

## 2022-04-04 NOTE — Telephone Encounter (Signed)
Left message on voicemail in reference to upcoming appointment scheduled for 04/11/22. Phone number given for a call back so details instructions can be given. [place]  [place] letter sent

## 2022-04-11 ENCOUNTER — Ambulatory Visit (HOSPITAL_COMMUNITY): Payer: Medicare Other | Attending: Nurse Practitioner

## 2022-04-11 ENCOUNTER — Ambulatory Visit (HOSPITAL_BASED_OUTPATIENT_CLINIC_OR_DEPARTMENT_OTHER): Payer: Medicare Other

## 2022-04-11 ENCOUNTER — Encounter: Payer: Self-pay | Admitting: Cardiology

## 2022-04-11 DIAGNOSIS — R0609 Other forms of dyspnea: Secondary | ICD-10-CM | POA: Insufficient documentation

## 2022-04-11 DIAGNOSIS — I25708 Atherosclerosis of coronary artery bypass graft(s), unspecified, with other forms of angina pectoris: Secondary | ICD-10-CM | POA: Diagnosis not present

## 2022-04-11 DIAGNOSIS — R110 Nausea: Secondary | ICD-10-CM

## 2022-04-11 DIAGNOSIS — I5032 Chronic diastolic (congestive) heart failure: Secondary | ICD-10-CM

## 2022-04-11 LAB — MYOCARDIAL PERFUSION IMAGING
LV dias vol: 158 mL (ref 46–106)
LV sys vol: 122 mL
Nuc Stress EF: 23 %
Peak HR: 104 {beats}/min
Rest HR: 74 {beats}/min
Rest Nuclear Isotope Dose: 10.2 mCi
SDS: 1
SRS: 4
SSS: 5
ST Depression (mm): 0 mm
Stress Nuclear Isotope Dose: 32.7 mCi
TID: 1.09

## 2022-04-11 LAB — ECHOCARDIOGRAM COMPLETE
Area-P 1/2: 4.89 cm2
S' Lateral: 4.6 cm

## 2022-04-11 MED ORDER — TECHNETIUM TC 99M TETROFOSMIN IV KIT
10.2000 | PACK | Freq: Once | INTRAVENOUS | Status: AC | PRN
  Administered 2022-04-11: 10.2 via INTRAVENOUS

## 2022-04-11 MED ORDER — TECHNETIUM TC 99M TETROFOSMIN IV KIT
32.7000 | PACK | Freq: Once | INTRAVENOUS | Status: AC | PRN
  Administered 2022-04-11: 32.7 via INTRAVENOUS

## 2022-04-11 MED ORDER — AMINOPHYLLINE 25 MG/ML IV SOLN
75.0000 mg | Freq: Once | INTRAVENOUS | Status: AC
  Administered 2022-04-11: 75 mg via INTRAVENOUS

## 2022-04-11 MED ORDER — REGADENOSON 0.4 MG/5ML IV SOLN
0.4000 mg | Freq: Once | INTRAVENOUS | Status: AC
  Administered 2022-04-11: 0.4 mg via INTRAVENOUS

## 2022-04-11 NOTE — Progress Notes (Unsigned)
Patient ID: Claudia Howell, female   DOB: 1940-11-07, 81 y.o.   MRN: [id number]  Consulted with ordering Physician Assistant, Ambrose Pancoast, in reference to patient's echocardiogram.

## 2022-04-12 ENCOUNTER — Encounter: Payer: Self-pay | Admitting: *Deleted

## 2022-04-12 ENCOUNTER — Telehealth: Payer: Self-pay | Admitting: Nurse Practitioner

## 2022-04-12 ENCOUNTER — Telehealth: Payer: Self-pay | Admitting: Interventional Cardiology

## 2022-04-12 MED ORDER — PREDNISONE 50 MG PO TABS: ORAL_TABLET | ORAL | 0 refills | Status: DC

## 2022-04-12 NOTE — Telephone Encounter (Signed)
  Claudia Howell was contacted this afternoon and her results were reviewed in detail.  I informed her that next steps in her treatment plan would be to pursue a left heart catheterization.  She reported that her chest pain has subsided however she is still having exertional dyspnea.  I advised her that her primary cardiologist will be out of the office until October 30 and would not be available to complete her catheterization until November 7 at the earliest.  She has elected to wait until Dr. Tamala Julian returns to have her cardiac catheterization completed.  I was able to obtain consent and patient was in agreement to proceed with catheterization as noted below.  I will forward this note to our triage nurse to arrange scheduling for catheterization.  I instructed patient to contact us with any questions or concerns regarding our next step in her treatment plan.  Patient had all questions answered to her satisfaction and she thanked me for the call.  Ambrose Pancoast, NP  Shared Decision Making/Informed Consent The risks [stroke (1 in 1000), death (1 in 33), kidney failure [usually temporary] (1 in 500), bleeding (1 in 200), allergic reaction [possibly serious] (1 in 200)], benefits (diagnostic support and management of coronary artery disease) and alternatives of a cardiac catheterization were discussed in detail with Claudia Howell and she is willing to proceed.

## 2022-04-12 NOTE — Telephone Encounter (Signed)
Call placed this morning to review test results with patient.  Patient currently not available and detailed message left on her answering machine to return call at earliest convenience.  Ambrose Pancoast, NP

## 2022-04-12 NOTE — Addendum Note (Signed)
Addended by: Rodman Key on: 04/12/2022 03:18 PM   Modules accepted: Orders

## 2022-04-12 NOTE — Telephone Encounter (Signed)
The patient will be out of date for updated H&P, ekg and labs by the first available cath date for Dr. Tamala Julian.  The patient will need an appointment to update these things.  She is already scheduled back with Ambrose Pancoast, NP on 04/25/22.    I called the patient and discussed and she would like to proceed with left heart cath on 05/01/22.  I have placed her on the cath schedule for this day 9:00 am with Dr. Tamala Julian  We reviewed instructions and I have sent them through [place]. She has an allergy to contrast and we reviewed procedure for this as well.

## 2022-04-12 NOTE — Telephone Encounter (Signed)
Pt c/o medication issue:  1. Name of Medication: predniSONE (DELTASONE) 50 MG tablet  2. How are you currently taking this medication (dosage and times per day)?    3. Are you having a reaction (difficulty breathing--STAT)? no  4. What is your medication issue? Calling to get instruction for this medication. Please advise

## 2022-04-12 NOTE — Telephone Encounter (Signed)
Called back to cath lab and changed procedure to left and right HC per Ambrose Pancoast, NP.

## 2022-04-12 NOTE — Telephone Encounter (Signed)
Advised: Prior to heart cath take: 1 tablet 13 hours prior, 7 hours prior and when leaving home for procedure along with a Benadryl 50 mg with the last dose of prednisone as listed on letter for heart cath.   Verbalized understanding and agreement.

## 2022-04-13 MED ORDER — SODIUM CHLORIDE 0.9% FLUSH: 3.0000 mL | Freq: Two times a day (BID) | INTRAVENOUS | Status: AC

## 2022-04-13 NOTE — Addendum Note (Signed)
Addended by: Tempie Donning on: 04/13/2022 01:21 PM   Modules accepted: Orders

## 2022-04-17 ENCOUNTER — Other Ambulatory Visit (INDEPENDENT_AMBULATORY_CARE_PROVIDER_SITE_OTHER): Payer: Medicare Other

## 2022-04-17 DIAGNOSIS — Z23 Encounter for immunization: Secondary | ICD-10-CM

## 2022-04-24 ENCOUNTER — Encounter: Payer: Self-pay | Admitting: Interventional Cardiology

## 2022-04-25 ENCOUNTER — Encounter: Payer: Self-pay | Admitting: Nurse Practitioner

## 2022-04-25 ENCOUNTER — Ambulatory Visit: Payer: Medicare Other | Admitting: Nurse Practitioner

## 2022-04-25 ENCOUNTER — Ambulatory Visit: Payer: Medicare Other | Attending: Nurse Practitioner | Admitting: Nurse Practitioner

## 2022-04-25 VITALS — BP 120/64 | HR 70 | Ht 65.0 in | Wt 130.0 lb

## 2022-04-25 DIAGNOSIS — I255 Ischemic cardiomyopathy: Secondary | ICD-10-CM | POA: Diagnosis not present

## 2022-04-25 DIAGNOSIS — I10 Essential (primary) hypertension: Secondary | ICD-10-CM

## 2022-04-25 DIAGNOSIS — R0609 Other forms of dyspnea: Secondary | ICD-10-CM | POA: Diagnosis not present

## 2022-04-25 DIAGNOSIS — I447 Left bundle-branch block, unspecified: Secondary | ICD-10-CM | POA: Diagnosis not present

## 2022-04-25 DIAGNOSIS — Z9889 Other specified postprocedural states: Secondary | ICD-10-CM | POA: Diagnosis not present

## 2022-04-25 DIAGNOSIS — I480 Paroxysmal atrial fibrillation: Secondary | ICD-10-CM

## 2022-04-25 DIAGNOSIS — E785 Hyperlipidemia, unspecified: Secondary | ICD-10-CM

## 2022-04-25 DIAGNOSIS — I25708 Atherosclerosis of coronary artery bypass graft(s), unspecified, with other forms of angina pectoris: Secondary | ICD-10-CM | POA: Diagnosis not present

## 2022-04-25 DIAGNOSIS — Z8679 Personal history of other diseases of the circulatory system: Secondary | ICD-10-CM

## 2022-04-25 DIAGNOSIS — I5043 Acute on chronic combined systolic (congestive) and diastolic (congestive) heart failure: Secondary | ICD-10-CM

## 2022-04-25 DIAGNOSIS — I5032 Chronic diastolic (congestive) heart failure: Secondary | ICD-10-CM | POA: Diagnosis not present

## 2022-04-25 NOTE — H&P (View-Only) (Signed)
Cardiology Office Note:    Date:  04/25/2022   ID:  DEMYAH SMYRE, DOB 05-12-1941, MRN [id number]  PCP:  Rita Ohara, MD   [facility] Providers Cardiologist:  Sinclair Grooms, MD     Referring MD: Rita Ohara, MD   Chief Complaint: abnormal   History of Present Illness:    Claudia Howell is a very pleasant 81 y.o. female with a hx of CAD s/p CABG x 3 06/2012 with MAZE, PCI to SVG in 2014, NSTEMI 05/15/2019 treated with RCA 2 CTO with stent, HTN, HLD, chronic combined CHF, and PAF s/p MAZE  Seen by Ambrose Pancoast, NP on 03/21/2022 for follow-up of stomach discomfort and possible CHF exacerbation. Started on Farxiga 08/2021 for DOE. At [place], she reported sharp pain in her stomach with nausea that was not relieved with NTG.  Also having increased shortness of breath when lying flat and activity intolerance when walking.  Also experiencing increased amount of phlegm that she feels may be associated with CHF. She stopped Farxiga due to leg cramps and pain. 2D echo was planned and she was advised to increase Lasix to 40 mg for 2 days then 20 mg daily. Lexiscan Myoview ordered for evaluation of ischemia and to follow-up in 1 month.  Echo revealed severely reduced LV function 20 to 25%, akinesis of the base/mid septum, base/mid inferior, distal lateral and apical walls, hypokinesis elsewhere except for base/mid anterior wall that appears to thicken more normally.  When compared to previous echo in 2020 LV function L severely reduced, diastolic parameters consistent with grade 3 diastolic dysfunction, elevated left atrial pressure, RV function moderately reduced, mild MR. Lexiscan Myoview revealed findings consistent with ischemic cardiomyopathy, 2 defects consistent with LAD and RCA infarction, EF severely reduced, findings fully outlined below.  She was recommended to proceed with right and left heart catheterization which is scheduled for 05/01/2022 with Dr. Tamala Julian.  Today, she is here for evaluation for  upcoming R/LHC. Reports she has had slight improvement in symptoms but continues to have chest discomfort and DOE. No orthopnea, PND, or edema. Has not been monitoring BP recently but it typically runs low rather than high.  No palpitations, presyncope, syncope.  She is aware of the risks of cardiac catheterization and agrees to proceed.   Past Medical History:  Diagnosis Date   Adenomatous colon polyp 03/2008   CHF (congestive heart failure) (HCC)    Constipation    Coronary artery disease    Last cath 12/14 Cath 12/8 100% SVG occlusion to RCA, 95% SVG.  The stenosis to OM.  LIMA to LAD patent but diffuse disease in LAD after graft.  Overlapping stents mid/distal SVG to OM.   Decubitus ulcer of coccyx 10/22/2012   1/2 inch raw open area on coccyx   Hyperlipidemia    Hypertension    Dr.Henry Tamala Julian   Myocardial infarction [facility])    Osteopenia 02/2008   PAF (paroxysmal atrial fibrillation) ([place])    during cath/notes 10/16/2012   Right posterior capsular opacification 11/16/2019   S/P CABG x 3 10/22/2012   LIMA to LAD, SVG to distal RCA, SVG to OM, EVH from right thigh   S/P Maze operation for atrial fibrillation 10/22/2012   Left side lesion set using bipolar radiofrequency and cryothermy ablation with clipping of LA appendage   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    Past Surgical History:  Procedure Laterality Date   BREAST CYST EXCISION Left 1980-1990's   x 3  CARDIAC CATHETERIZATION  10/16/2012   CATARACT EXTRACTION, BILATERAL Bilateral    03/2015 and 05/2015   CATARACT EXTRACTION, BILATERAL Bilateral approx 2016   CORONARY ANGIOPLASTY WITH STENT PLACEMENT  04/01/2013   "1" (06/01/2013)   CORONARY ARTERY BYPASS GRAFT N/A 10/22/2012   Procedure: CORONARY ARTERY BYPASS GRAFTING (CABG);  Surgeon: Rexene Alberts, MD;  Location: [place];  Service: Open Heart Surgery;  Laterality: N/A;   CORONARY ATHERECTOMY N/A 04/29/2019   Procedure: CORONARY ATHERECTOMY;  Surgeon: Martinique,  Peter M, MD;  Location: [place] CV LAB;  Service: Cardiovascular;  Laterality: N/A;   CORONARY CTO INTERVENTION N/A 04/29/2019   Procedure: CORONARY CTO INTERVENTION;  Surgeon: Martinique, Peter M, MD;  Location: [place] CV LAB;  Service: Cardiovascular;  Laterality: N/A;   CORONARY STENT INTERVENTION N/A 01/01/2019   Procedure: CORONARY STENT INTERVENTION;  Surgeon: Belva Crome, MD;  Location: [place] CV LAB;  Service: Cardiovascular;  Laterality: N/A;   DILATION AND CURETTAGE OF UTERUS     INTRAOPERATIVE TRANSESOPHAGEAL ECHOCARDIOGRAM N/A 10/22/2012   Procedure: INTRAOPERATIVE TRANSESOPHAGEAL ECHOCARDIOGRAM;  Surgeon: Rexene Alberts, MD;  Location: [place];  Service: Open Heart Surgery;  Laterality: N/A;   LEFT HEART CATH AND CORS/GRAFTS ANGIOGRAPHY N/A 01/01/2019   Procedure: LEFT HEART CATH AND CORS/GRAFTS ANGIOGRAPHY;  Surgeon: Belva Crome, MD;  Location: [place] CV LAB;  Service: Cardiovascular;  Laterality: N/A;   LEFT HEART CATH AND CORS/GRAFTS ANGIOGRAPHY N/A 04/28/2019   Procedure: LEFT HEART CATH AND CORS/GRAFTS ANGIOGRAPHY;  Surgeon: Belva Crome, MD;  Location: [place] CV LAB;  Service: Cardiovascular;  Laterality: N/A;   LEFT HEART CATHETERIZATION WITH CORONARY ANGIOGRAM N/A 10/16/2012   Procedure: LEFT HEART CATHETERIZATION WITH CORONARY ANGIOGRAM;  Surgeon: Sinclair Grooms, MD;  Location: [facility] C[facility] LAB;  Service: Cardiovascular;  Laterality: N/A;   LEFT HEART CATHETERIZATION WITH CORONARY/GRAFT ANGIOGRAM N/A 06/01/2013   Procedure: LEFT HEART CATHETERIZATION WITH Beatrix Fetters;  Surgeon: Sinclair Grooms, MD;  Location: [facility] CATH LAB;  Service: Cardiovascular;  Laterality: N/A;   MAZE N/A 10/22/2012   Procedure: MAZE;  Surgeon: Rexene Alberts, MD;  Location: [place];  Service: Open Heart Surgery;  Laterality: N/A;   PERCUTANEOUS CORONARY STENT INTERVENTION (PCI-S)  06/01/2013   Procedure: PERCUTANEOUS CORONARY STENT INTERVENTION (PCI-S);  Surgeon: Sinclair Grooms, MD;  Location: [facility] CATH LAB;  Service: Cardiovascular;;   TONSILLECTOMY  1949   TOTAL ABDOMINAL HYSTERECTOMY W/ BILATERAL SALPINGOOPHORECTOMY  1990   benign growth    Current Medications: Current Meds  Medication Sig   atenolol (TENORMIN) 100 MG tablet TAKE 1 TABLET(100 MG) BY MOUTH DAILY   atorvastatin (LIPITOR) 10 MG tablet Take 1 tablet (10 mg total) by mouth daily.   benazepril (LOTENSIN) 40 MG tablet TAKE 1 TABLET BY MOUTH DAILY   cholecalciferol (VITAMIN D) 1000 UNITS tablet Take 1,000 Units by mouth daily.   clopidogrel (PLAVIX) 75 MG tablet Take 75 mg by mouth daily.   Coenzyme Q10 (COQ10 PO) Take 1 capsule by mouth daily.   furosemide (LASIX) 20 MG tablet Take 1 tablet (20 mg total) by mouth daily.   GLUCOSAMINE-CHONDROITIN-VIT D3 PO Take 1 tablet by mouth every other day.   hydrochlorothiazide (HYDRODIURIL) 25 MG tablet TAKE 1 TABLET BY MOUTH DAILY   Multiple Vitamin (MULTIVITAMIN) tablet Take 1 tablet by mouth daily.   nitroGLYCERIN (NITROSTAT) 0.4 MG SL tablet PLACE AND DISSOLVE 1 TABLET UNDER THE TONGUE EVERY 5 MINUTE AS NEEDED FOR CHEST PAIN  polyethylene glycol (MIRALAX / GLYCOLAX) 17 g packet Take 17 g by mouth daily.   Potassium 99 MG TABS Take 1 tablet by mouth 3 (three) times a week.   predniSONE (DELTASONE) 50 MG tablet Take as directed   Current Facility-Administered Medications for the 04/25/22 encounter (Office Visit) with Ann Maki, Lanice Schwab, NP  Medication   sodium chloride flush (NS) 0.9 % injection 3 mL     Allergies:   Contrast media [iodinated contrast media], Pravastatin, Simvastatin, Tramadol hcl, Zetia [ezetimibe], Oxycodone, and Repatha [evolocumab]   Social History   Socioeconomic History   Marital status: Widowed    Spouse name: Not on file   Number of children: 2   Years of education: 5   Highest education level: Associate degree: occupational, Hotel manager, or vocational program  Occupational History   Occupation: retired    Comment: LPN   Tobacco Use   Smoking status: Never   Smokeless tobacco: Never  Vaping Use   Vaping Use: Never used  Substance and Sexual Activity   Alcohol use: Yes    Alcohol/week: 2.0 standard drinks of alcohol    Types: 2 Glasses of wine per week    Comment: 0-2 glasses of wine/week   Drug use: No   Sexual activity: Not Currently  Other Topics Concern   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  1 son in [place], 1 son in [place]. 7 grandchildren, 2 great granddaughters, 1 great grandson (in [place])   Social Determinants of Health   Financial Resource Strain: Low Risk  (05/26/2019)   Overall Financial Resource Strain (CARDIA)    Difficulty of Paying Living Expenses: Not hard at all  Food Insecurity: No Food Insecurity (05/26/2019)   Hunger Vital Sign    Worried About Running Out of Food in the Last Year: Never true    Ran Out of Food in the Last Year: Never true  Transportation Needs: No Transportation Needs (05/26/2019)   PRAPARE - Hydrologist (Medical): No    Lack of Transportation (Non-Medical): No  Physical Activity: Insufficiently Active (05/26/2019)   Exercise Vital Sign    Days of Exercise per Week: 5 days    Minutes of Exercise per Session: 20 min  Stress: No Stress Concern Present (05/26/2019)   [place]    Feeling of Stress : Not at all  Social Connections: Unknown (05/26/2019)   Social Connection and Isolation Panel [NHANES]    Frequency of Communication with Friends and Family: Not asked    Frequency of Social Gatherings with Friends and Family: Not asked    Attends Religious Services: Not on file    Active Member of Clubs or Organizations: Not on file    Attends Archivist Meetings: Not asked    Marital Status: Widowed     Family History: The patient's family history includes Cancer in her brother; Heart attack (age of onset: 5) in her mother;  Heart disease in an other family member; Heart disease (age of onset: 68) in her brother; Hypertension in her brother and mother; Prostate cancer in her brother. There is no history of Diabetes, Breast cancer, Colon cancer, Stomach cancer, Rectal cancer, Liver cancer, Pancreatic cancer, or Esophageal cancer.  ROS:   Please see the history of present illness.     All other systems reviewed and are negative.  Labs/Other Studies Reviewed:    The following  studies were reviewed today:  Lexiscan Myoview 04/11/22    Findings are consistent with ischemic cardiomyopathy. There are 2 defects consistent with LAD and RCA infarction as detailed below. EF severely reduced. This is a high-risk study.   LV perfusion is abnormal. There is no evidence of ischemia. There is evidence of infarction. Defect 1: There is a small defect with moderate reduction in uptake present in the apical to mid anteroseptal location(s) that is fixed. There is abnormal wall motion in the defect area. Consistent with infarction. Defect 2: There is a small defect with moderate reduction in uptake present in the apical to mid inferior and apex location(s) that is fixed. There is abnormal wall motion in the defect area. Consistent with infarction.   Left ventricular function is abnormal. Nuclear stress EF: 23 %. The left ventricular ejection fraction is severely decreased (<30%). End diastolic cavity size is severely enlarged.   Findings are consistent with prior myocardial infarction. The study is high risk.  Echo 04/11/22  1. LV function is severely depressed with akinesis of the base/mid  septum, base/mid inferior, distal lateral and apical walls; Hypokinesis  elsewhere except for base/mid anteiror wall that appears to thicken more  normally. Compared to previous echo in  2020, LV function is now severely reduced.. Left ventricular ejection  fraction, by estimation, is 20 to 25%. The left ventricle has severely  decreased  function. Left ventricular diastolic parameters are consistent  with Grade III diastolic dysfunction  (restrictive). Elevated left atrial pressure.   2. Right ventricular systolic function is moderately reduced. The right  ventricular size is normal.   3. Right atrial size was mildly dilated.   4. Mild mitral valve regurgitation.   5. The aortic valve is tricuspid. Aortic valve regurgitation is trivial.   LHC 04/28/2019  Left internal mammary to mid LAD is widely patent Bypass graft obtuse marginal is widely patent including the stent in the distal graft. The patient has known occlusion of the saphenous vein graft to the right coronary. Left main is patent.  Mid LAD after a large diagonal contains 85% stenosis.  Competitive flow beyond this is noted due to the patent LIMA. Native circumflex is totally occluded. The right coronary contains diffuse disease with 85 to 90% calcified mid segment stenosis segmental 40% narrowing in the mid, followed by calcified focal mid stenosis that is angulated and does have a a channel.  I was unable to wire this successfully on the last procedure. Normal LVEDP.  Echo this admission demonstrates normal systolic function.   RECOMMENDATIONS:   Attempt PCI of right coronary tomorrow by Dr. Martinique.  Hopefully he can cross the lesion primarily but may need CTO techniques on the mid right coronary. N.p.o. after midnight.  Recent Labs: 05/11/2021: ALT 14; Hemoglobin 13.4; Platelets 207; TSH 3.190 03/29/2022: BUN 22; Creatinine, Ser 0.92; Potassium 3.6; Sodium 136  Recent Lipid Panel    Component Value Date/Time   CHOL 151 01/05/2021 0901   TRIG 149 01/05/2021 0901   HDL 48 01/05/2021 0901   CHOLHDL 3.1 01/05/2021 0901   CHOLHDL 2.9 04/25/2019 0819   VLDL 21 04/25/2019 0819   LDLCALC 77 01/05/2021 0901     Risk Assessment/Calculations:        Physical Exam:    VS:  BP 120/64 (BP Location: Right Arm, Patient Position: Sitting, Cuff Size: Normal)    Pulse 70   Ht '5\' 5"'$  (1.651 m)   Wt 130 lb (59 kg)   SpO2 97%  BMI 21.63 kg/m     Wt Readings from Last 3 Encounters:  04/25/22 130 lb (59 kg)  03/21/22 133 lb (60.3 kg)  02/07/22 136 lb 12.8 oz (62.1 kg)     GEN:  Well nourished, well developed in no acute distress HEENT: Normal NECK: No JVD; No carotid bruits CARDIAC: RRR, no murmurs, rubs, gallops RESPIRATORY:  Clear to auscultation without rales, wheezing or rhonchi  ABDOMEN: Soft, non-tender, non-distended MUSCULOSKELETAL:  No edema; No deformity. 2+ pedal pulses, equal bilaterally SKIN: Warm and dry NEUROLOGIC:  Alert and oriented x 3 PSYCHIATRIC:  Normal affect   EKG:  EKG is ordered today.  The ekg ordered today demonstrates NSR at 70 bpm, LBBB, no acute change from previous   Diagnoses:    1. Acute on chronic combined systolic and diastolic CHF, NYHA class 2 ([place])   2. Coronary artery disease of bypass graft of native heart with stable angina pectoris ([place])   3. Hyperlipidemia LDL goal <70   4. Ischemic cardiomyopathy   5. Dyspnea on exertion   6. S/P Maze operation for atrial fibrillation   7. Paroxysmal atrial fibrillation (HCC)   8. Left bundle branch block (LBBB)   9. Essential hypertension    Assessment and Plan:     CAD with stable angina: History of CABG 2014, PCI to SVG in 2014, NSTEMI 05/15/2019 treated with RCA 2 CTO with stent. Had symptoms concerning for angina and underwent nuclear stress testing as well as 2D echo on 04/11/22. Results reviewed by primary cardiologist and she is scheduled for right and left heart catheterization on 11/7. Feels that her symptoms have improved slightly on Lasix. Risks of cardiac catheterization have been reviewed and she agrees to proceed.  Continue Plavix, benazepril, atenolol, atorvastatin.  DOE/Acute on chronic CHF/ICM: LVEF 20-25% on echo and abnormal nuclear stress test 04/11/22. Has chest discomfort and DOE, no edema, orthopnea, and PND.  No evidence of volume  overload on exam today.  Consideration given to starting Entresto, however with cath in 5 days, felt it better to wait until after cath to ensure that kidney function is not affected by Entresto. She did not tolerate [place]. Could consider Jardiance. For now, we will continue atenolol, benazepril, Lasix, hydrochlorothiazide.   Hypertension: BP is well-controlled today. Occasionally notes a lower reading at home. No medication changes today.   PAF s/p MAZE 2014: No indication of return of atrial fibrillation.  LBBB: Recent decrease in LV function on echo and NST 04/11/22.  Is scheduled for right and left heart catheterization on 05/01/2022.  Hyperlipidemia LDL goal < 70: LDL 77 on 01/05/21. We will recheck today. Will likely need to increase statin intensity. Continue atorvastatin.  Shared Decision Making/Informed Consent The risks [stroke (1 in 1000), death (1 in 1000), kidney failure [usually temporary] (1 in 500), bleeding (1 in 200), allergic reaction [possibly serious] (1 in 200)], benefits (diagnostic support and management of coronary artery disease) and alternatives of a cardiac catheterization were discussed in detail with Claudia Howell and she is willing to proceed.   Disposition: 3-4 weeks with Dr. Tamala Julian or APP  Medication Adjustments/Labs and Tests Ordered: Current medicines are reviewed at length with the patient today.  Concerns regarding medicines are outlined above.  Orders Placed This Encounter  Procedures   Lipid Profile   CBC   Basic Metabolic Panel (BMET)   EKG 12-Lead   No orders of the defined types were placed in this encounter.   Patient Instructions  Medication Instructions:  Your physician recommends that you continue on your current medications as directed. Please refer to the Current Medication list given to you today.   *If you need a refill on your cardiac medications before your next appointment, please call your pharmacy*   Lab Work:  TODAY!!!!!  BMET/LIPID/CBC!!!  If you have labs (blood work) drawn today and your tests are completely normal, you will receive your results only by: [place] [place] (if you have MyChart) OR A paper copy in the mail If you have any lab test that is abnormal or we need to change your treatment, we will call you to review the results.   Testing/Procedures:  Please read letter.    Follow-Up: At [facility], you and your health needs are our priority.  As part of our continuing mission to provide you with exceptional heart care, we have created designated Provider Care Teams.  These Care Teams include your primary Cardiologist (physician) and Advanced Practice Providers (APPs -  Physician Assistants and Nurse Practitioners) who all work together to provide you with the care you need, when you need it.  We recommend signing up for the patient portal called "MyChart".  Sign up information is provided on this After Visit Summary.  MyChart is used to connect with patients for Virtual Visits (Telemedicine).  Patients are able to view lab/test results, encounter notes, upcoming appointments, etc.  Non-urgent messages can be sent to your provider as well.   To learn more about what you can do with MyChart, go to [email or web address].    Your next appointment:   5 week(s)  The format for your next appointment:   In Person  Provider:   Sinclair Grooms, MD     Important Information About Sugar         Signed, Emmaline Life, NP  04/25/2022 4:45 PM    [place]

## 2022-04-25 NOTE — Progress Notes (Signed)
Cardiology Office Note:    Date:  04/25/2022   ID:  YAREXI PAWLICKI, DOB Dec 06, 1940, MRN [id number]  PCP:  Rita Ohara, MD   [facility] Providers Cardiologist:  Sinclair Grooms, MD     Referring MD: Rita Ohara, MD   Chief Complaint: abnormal   History of Present Illness:    Claudia Howell is a very pleasant 81 y.o. female with a hx of CAD s/p CABG x 3 06/2012 with MAZE, PCI to SVG in 2014, NSTEMI 05/15/2019 treated with RCA 2 CTO with stent, HTN, HLD, chronic combined CHF, and PAF s/p MAZE  Seen by Ambrose Pancoast, NP on 03/21/2022 for follow-up of stomach discomfort and possible CHF exacerbation. Started on Farxiga 08/2021 for DOE. At [place], she reported sharp pain in her stomach with nausea that was not relieved with NTG.  Also having increased shortness of breath when lying flat and activity intolerance when walking.  Also experiencing increased amount of phlegm that she feels may be associated with CHF. She stopped Farxiga due to leg cramps and pain. 2D echo was planned and she was advised to increase Lasix to 40 mg for 2 days then 20 mg daily. Lexiscan Myoview ordered for evaluation of ischemia and to follow-up in 1 month.  Echo revealed severely reduced LV function 20 to 25%, akinesis of the base/mid septum, base/mid inferior, distal lateral and apical walls, hypokinesis elsewhere except for base/mid anterior wall that appears to thicken more normally.  When compared to previous echo in 2020 LV function L severely reduced, diastolic parameters consistent with grade 3 diastolic dysfunction, elevated left atrial pressure, RV function moderately reduced, mild MR. Lexiscan Myoview revealed findings consistent with ischemic cardiomyopathy, 2 defects consistent with LAD and RCA infarction, EF severely reduced, findings fully outlined below.  She was recommended to proceed with right and left heart catheterization which is scheduled for 05/01/2022 with Dr. Tamala Julian.  Today, she is here for evaluation for  upcoming R/LHC. Reports she has had slight improvement in symptoms but continues to have chest discomfort and DOE. No orthopnea, PND, or edema. Has not been monitoring BP recently but it typically runs low rather than high.  No palpitations, presyncope, syncope.  She is aware of the risks of cardiac catheterization and agrees to proceed.   Past Medical History:  Diagnosis Date   Adenomatous colon polyp 03/2008   CHF (congestive heart failure) (HCC)    Constipation    Coronary artery disease    Last cath 12/14 Cath 12/8 100% SVG occlusion to RCA, 95% SVG.  The stenosis to OM.  LIMA to LAD patent but diffuse disease in LAD after graft.  Overlapping stents mid/distal SVG to OM.   Decubitus ulcer of coccyx 10/22/2012   1/2 inch raw open area on coccyx   Hyperlipidemia    Hypertension    Dr.Henry Tamala Julian   Myocardial infarction [facility])    Osteopenia 02/2008   PAF (paroxysmal atrial fibrillation) ([place])    during cath/notes 10/16/2012   Right posterior capsular opacification 11/16/2019   S/P CABG x 3 10/22/2012   LIMA to LAD, SVG to distal RCA, SVG to OM, EVH from right thigh   S/P Maze operation for atrial fibrillation 10/22/2012   Left side lesion set using bipolar radiofrequency and cryothermy ablation with clipping of LA appendage   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    Past Surgical History:  Procedure Laterality Date   BREAST CYST EXCISION Left 1980-1990's   x 3  CARDIAC CATHETERIZATION  10/16/2012   CATARACT EXTRACTION, BILATERAL Bilateral    03/2015 and 05/2015   CATARACT EXTRACTION, BILATERAL Bilateral approx 2016   CORONARY ANGIOPLASTY WITH STENT PLACEMENT  04/01/2013   "1" (06/01/2013)   CORONARY ARTERY BYPASS GRAFT N/A 10/22/2012   Procedure: CORONARY ARTERY BYPASS GRAFTING (CABG);  Surgeon: Rexene Alberts, MD;  Location: [place];  Service: Open Heart Surgery;  Laterality: N/A;   CORONARY ATHERECTOMY N/A 04/29/2019   Procedure: CORONARY ATHERECTOMY;  Surgeon: Martinique,  Peter M, MD;  Location: [place] CV LAB;  Service: Cardiovascular;  Laterality: N/A;   CORONARY CTO INTERVENTION N/A 04/29/2019   Procedure: CORONARY CTO INTERVENTION;  Surgeon: Martinique, Peter M, MD;  Location: [place] CV LAB;  Service: Cardiovascular;  Laterality: N/A;   CORONARY STENT INTERVENTION N/A 01/01/2019   Procedure: CORONARY STENT INTERVENTION;  Surgeon: Belva Crome, MD;  Location: [place] CV LAB;  Service: Cardiovascular;  Laterality: N/A;   DILATION AND CURETTAGE OF UTERUS     INTRAOPERATIVE TRANSESOPHAGEAL ECHOCARDIOGRAM N/A 10/22/2012   Procedure: INTRAOPERATIVE TRANSESOPHAGEAL ECHOCARDIOGRAM;  Surgeon: Rexene Alberts, MD;  Location: [place];  Service: Open Heart Surgery;  Laterality: N/A;   LEFT HEART CATH AND CORS/GRAFTS ANGIOGRAPHY N/A 01/01/2019   Procedure: LEFT HEART CATH AND CORS/GRAFTS ANGIOGRAPHY;  Surgeon: Belva Crome, MD;  Location: [place] CV LAB;  Service: Cardiovascular;  Laterality: N/A;   LEFT HEART CATH AND CORS/GRAFTS ANGIOGRAPHY N/A 04/28/2019   Procedure: LEFT HEART CATH AND CORS/GRAFTS ANGIOGRAPHY;  Surgeon: Belva Crome, MD;  Location:  CV LAB;  Service: Cardiovascular;  Laterality: N/A;   LEFT HEART CATHETERIZATION WITH CORONARY ANGIOGRAM N/A 10/16/2012   Procedure: LEFT HEART CATHETERIZATION WITH CORONARY ANGIOGRAM;  Surgeon: Sinclair Grooms, MD;  Location: [facility] CATH LAB;  Service: Cardiovascular;  Laterality: N/A;   LEFT HEART CATHETERIZATION WITH CORONARY/GRAFT ANGIOGRAM N/A 06/01/2013   Procedure: LEFT HEART CATHETERIZATION WITH Beatrix Fetters;  Surgeon: Sinclair Grooms, MD;  Location: [facility] CATH LAB;  Service: Cardiovascular;  Laterality: N/A;   MAZE N/A 10/22/2012   Procedure: MAZE;  Surgeon: Rexene Alberts, MD;  Location: [place];  Service: Open Heart Surgery;  Laterality: N/A;   PERCUTANEOUS CORONARY STENT INTERVENTION (PCI-S)  06/01/2013   Procedure: PERCUTANEOUS CORONARY STENT INTERVENTION (PCI-S);  Surgeon: Sinclair Grooms, MD;  Location: [facility] CATH LAB;  Service: Cardiovascular;;   TONSILLECTOMY  1949   TOTAL ABDOMINAL HYSTERECTOMY W/ BILATERAL SALPINGOOPHORECTOMY  1990   benign growth    Current Medications: Current Meds  Medication Sig   atenolol (TENORMIN) 100 MG tablet TAKE 1 TABLET(100 MG) BY MOUTH DAILY   atorvastatin (LIPITOR) 10 MG tablet Take 1 tablet (10 mg total) by mouth daily.   benazepril (LOTENSIN) 40 MG tablet TAKE 1 TABLET BY MOUTH DAILY   cholecalciferol (VITAMIN D) 1000 UNITS tablet Take 1,000 Units by mouth daily.   clopidogrel (PLAVIX) 75 MG tablet Take 75 mg by mouth daily.   Coenzyme Q10 (COQ10 PO) Take 1 capsule by mouth daily.   furosemide (LASIX) 20 MG tablet Take 1 tablet (20 mg total) by mouth daily.   GLUCOSAMINE-CHONDROITIN-VIT D3 PO Take 1 tablet by mouth every other day.   hydrochlorothiazide (HYDRODIURIL) 25 MG tablet TAKE 1 TABLET BY MOUTH DAILY   Multiple Vitamin (MULTIVITAMIN) tablet Take 1 tablet by mouth daily.   nitroGLYCERIN (NITROSTAT) 0.4 MG SL tablet PLACE AND DISSOLVE 1 TABLET UNDER THE TONGUE EVERY 5 MINUTE AS NEEDED FOR CHEST PAIN  polyethylene glycol (MIRALAX / GLYCOLAX) 17 g packet Take 17 g by mouth daily.   Potassium 99 MG TABS Take 1 tablet by mouth 3 (three) times a week.   predniSONE (DELTASONE) 50 MG tablet Take as directed   Current Facility-Administered Medications for the 04/25/22 encounter (Office Visit) with Ann Maki, Lanice Schwab, NP  Medication   sodium chloride flush (NS) 0.9 % injection 3 mL     Allergies:   Contrast media [iodinated contrast media], Pravastatin, Simvastatin, Tramadol hcl, Zetia [ezetimibe], Oxycodone, and Repatha [evolocumab]   Social History   Socioeconomic History   Marital status: Widowed    Spouse name: Not on file   Number of children: 2   Years of education: 26   Highest education level: Associate degree: occupational, Hotel manager, or vocational program  Occupational History   Occupation: retired    Comment: LPN   Tobacco Use   Smoking status: Never   Smokeless tobacco: Never  Vaping Use   Vaping Use: Never used  Substance and Sexual Activity   Alcohol use: Yes    Alcohol/week: 2.0 standard drinks of alcohol    Types: 2 Glasses of wine per week    Comment: 0-2 glasses of wine/week   Drug use: No   Sexual activity: Not Currently  Other Topics Concern   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  1 son in [place], 1 son in [place]. 7 grandchildren, 2 great granddaughters, 1 great grandson (in [place])   Social Determinants of Health   Financial Resource Strain: Low Risk  (05/26/2019)   Overall Financial Resource Strain (CARDIA)    Difficulty of Paying Living Expenses: Not hard at all  Food Insecurity: No Food Insecurity (05/26/2019)   Hunger Vital Sign    Worried About Running Out of Food in the Last Year: Never true    Ran Out of Food in the Last Year: Never true  Transportation Needs: No Transportation Needs (05/26/2019)   PRAPARE - Hydrologist (Medical): No    Lack of Transportation (Non-Medical): No  Physical Activity: Insufficiently Active (05/26/2019)   Exercise Vital Sign    Days of Exercise per Week: 5 days    Minutes of Exercise per Session: 20 min  Stress: No Stress Concern Present (05/26/2019)   [place]    Feeling of Stress : Not at all  Social Connections: Unknown (05/26/2019)   Social Connection and Isolation Panel [NHANES]    Frequency of Communication with Friends and Family: Not asked    Frequency of Social Gatherings with Friends and Family: Not asked    Attends Religious Services: Not on file    Active Member of Clubs or Organizations: Not on file    Attends Archivist Meetings: Not asked    Marital Status: Widowed     Family History: The patient's family history includes Cancer in her brother; Heart attack (age of onset: 9) in her mother;  Heart disease in an other family member; Heart disease (age of onset: 42) in her brother; Hypertension in her brother and mother; Prostate cancer in her brother. There is no history of Diabetes, Breast cancer, Colon cancer, Stomach cancer, Rectal cancer, Liver cancer, Pancreatic cancer, or Esophageal cancer.  ROS:   Please see the history of present illness.     All other systems reviewed and are negative.  Labs/Other Studies Reviewed:    The following  studies were reviewed today:  Lexiscan Myoview 04/11/22    Findings are consistent with ischemic cardiomyopathy. There are 2 defects consistent with LAD and RCA infarction as detailed below. EF severely reduced. This is a high-risk study.   LV perfusion is abnormal. There is no evidence of ischemia. There is evidence of infarction. Defect 1: There is a small defect with moderate reduction in uptake present in the apical to mid anteroseptal location(s) that is fixed. There is abnormal wall motion in the defect area. Consistent with infarction. Defect 2: There is a small defect with moderate reduction in uptake present in the apical to mid inferior and apex location(s) that is fixed. There is abnormal wall motion in the defect area. Consistent with infarction.   Left ventricular function is abnormal. Nuclear stress EF: 23 %. The left ventricular ejection fraction is severely decreased (<30%). End diastolic cavity size is severely enlarged.   Findings are consistent with prior myocardial infarction. The study is high risk.  Echo 04/11/22  1. LV function is severely depressed with akinesis of the base/mid  septum, base/mid inferior, distal lateral and apical walls; Hypokinesis  elsewhere except for base/mid anteiror wall that appears to thicken more  normally. Compared to previous echo in  2020, LV function is now severely reduced.. Left ventricular ejection  fraction, by estimation, is 20 to 25%. The left ventricle has severely  decreased  function. Left ventricular diastolic parameters are consistent  with Grade III diastolic dysfunction  (restrictive). Elevated left atrial pressure.   2. Right ventricular systolic function is moderately reduced. The right  ventricular size is normal.   3. Right atrial size was mildly dilated.   4. Mild mitral valve regurgitation.   5. The aortic valve is tricuspid. Aortic valve regurgitation is trivial.   LHC 04/28/2019  Left internal mammary to mid LAD is widely patent Bypass graft obtuse marginal is widely patent including the stent in the distal graft. The patient has known occlusion of the saphenous vein graft to the right coronary. Left main is patent.  Mid LAD after a large diagonal contains 85% stenosis.  Competitive flow beyond this is noted due to the patent LIMA. Native circumflex is totally occluded. The right coronary contains diffuse disease with 85 to 90% calcified mid segment stenosis segmental 40% narrowing in the mid, followed by calcified focal mid stenosis that is angulated and does have a a channel.  I was unable to wire this successfully on the last procedure. Normal LVEDP.  Echo this admission demonstrates normal systolic function.   RECOMMENDATIONS:   Attempt PCI of right coronary tomorrow by Dr. Martinique.  Hopefully he can cross the lesion primarily but may need CTO techniques on the mid right coronary. N.p.o. after midnight.  Recent Labs: 05/11/2021: ALT 14; Hemoglobin 13.4; Platelets 207; TSH 3.190 03/29/2022: BUN 22; Creatinine, Ser 0.92; Potassium 3.6; Sodium 136  Recent Lipid Panel    Component Value Date/Time   CHOL 151 01/05/2021 0901   TRIG 149 01/05/2021 0901   HDL 48 01/05/2021 0901   CHOLHDL 3.1 01/05/2021 0901   CHOLHDL 2.9 04/25/2019 0819   VLDL 21 04/25/2019 0819   LDLCALC 77 01/05/2021 0901     Risk Assessment/Calculations:        Physical Exam:    VS:  BP 120/64 (BP Location: Right Arm, Patient Position: Sitting, Cuff Size: Normal)    Pulse 70   Ht '5\' 5"'$  (1.651 m)   Wt 130 lb (59 kg)   SpO2 97%  BMI 21.63 kg/m     Wt Readings from Last 3 Encounters:  04/25/22 130 lb (59 kg)  03/21/22 133 lb (60.3 kg)  02/07/22 136 lb 12.8 oz (62.1 kg)     GEN:  Well nourished, well developed in no acute distress HEENT: Normal NECK: No JVD; No carotid bruits CARDIAC: RRR, no murmurs, rubs, gallops RESPIRATORY:  Clear to auscultation without rales, wheezing or rhonchi  ABDOMEN: Soft, non-tender, non-distended MUSCULOSKELETAL:  No edema; No deformity. 2+ pedal pulses, equal bilaterally SKIN: Warm and dry NEUROLOGIC:  Alert and oriented x 3 PSYCHIATRIC:  Normal affect   EKG:  EKG is ordered today.  The ekg ordered today demonstrates NSR at 70 bpm, LBBB, no acute change from previous   Diagnoses:    1. Acute on chronic combined systolic and diastolic CHF, NYHA class 2 ([place])   2. Coronary artery disease of bypass graft of native heart with stable angina pectoris ([place])   3. Hyperlipidemia LDL goal <70   4. Ischemic cardiomyopathy   5. Dyspnea on exertion   6. S/P Maze operation for atrial fibrillation   7. Paroxysmal atrial fibrillation (HCC)   8. Left bundle branch block (LBBB)   9. Essential hypertension    Assessment and Plan:     CAD with stable angina: History of CABG 2014, PCI to SVG in 2014, NSTEMI 05/15/2019 treated with RCA 2 CTO with stent. Had symptoms concerning for angina and underwent nuclear stress testing as well as 2D echo on 04/11/22. Results reviewed by primary cardiologist and she is scheduled for right and left heart catheterization on 11/7. Feels that her symptoms have improved slightly on Lasix. Risks of cardiac catheterization have been reviewed and she agrees to proceed.  Continue Plavix, benazepril, atenolol, atorvastatin.  DOE/Acute on chronic CHF/ICM: LVEF 20-25% on echo and abnormal nuclear stress test 04/11/22. Has chest discomfort and DOE, no edema, orthopnea, and PND.  No evidence of volume  overload on exam today.  Consideration given to starting Entresto, however with cath in 5 days, felt it better to wait until after cath to ensure that kidney function is not affected by Entresto. She did not tolerate [place]. Could consider Jardiance. For now, we will continue atenolol, benazepril, Lasix, hydrochlorothiazide.   Hypertension: BP is well-controlled today. Occasionally notes a lower reading at home. No medication changes today.   PAF s/p MAZE 2014: No indication of return of atrial fibrillation.  LBBB: Recent decrease in LV function on echo and NST 04/11/22.  Is scheduled for right and left heart catheterization on 05/01/2022.  Hyperlipidemia LDL goal < 70: LDL 77 on 01/05/21. We will recheck today. Will likely need to increase statin intensity. Continue atorvastatin.  Shared Decision Making/Informed Consent The risks [stroke (1 in 1000), death (1 in 1000), kidney failure [usually temporary] (1 in 500), bleeding (1 in 200), allergic reaction [possibly serious] (1 in 200)], benefits (diagnostic support and management of coronary artery disease) and alternatives of a cardiac catheterization were discussed in detail with Claudia Howell and she is willing to proceed.   Disposition: 3-4 weeks with Dr. Tamala Julian or APP  Medication Adjustments/Labs and Tests Ordered: Current medicines are reviewed at length with the patient today.  Concerns regarding medicines are outlined above.  Orders Placed This Encounter  Procedures   Lipid Profile   CBC   Basic Metabolic Panel (BMET)   EKG 12-Lead   No orders of the defined types were placed in this encounter.   Patient Instructions  Medication Instructions:  Your physician recommends that you continue on your current medications as directed. Please refer to the Current Medication list given to you today.   *If you need a refill on your cardiac medications before your next appointment, please call your pharmacy*   Lab Work:  TODAY!!!!!  BMET/LIPID/CBC!!!  If you have labs (blood work) drawn today and your tests are completely normal, you will receive your results only by: [place] (if you have MyChart) OR A paper copy in the mail If you have any lab test that is abnormal or we need to change your treatment, we will call you to review the results.   Testing/Procedures:  Please read letter.    Follow-Up: At [facility], you and your health needs are our priority.  As part of our continuing mission to provide you with exceptional heart care, we have created designated Provider Care Teams.  These Care Teams include your primary Cardiologist (physician) and Advanced Practice Providers (APPs -  Physician Assistants and Nurse Practitioners) who all work together to provide you with the care you need, when you need it.  We recommend signing up for the patient portal called "MyChart".  Sign up information is provided on this After Visit Summary.  MyChart is used to connect with patients for Virtual Visits (Telemedicine).  Patients are able to view lab/test results, encounter notes, upcoming appointments, etc.  Non-urgent messages can be sent to your provider as well.   To learn more about what you can do with MyChart, go to [email or web address].    Your next appointment:   5 week(s)  The format for your next appointment:   In Person  Provider:   Sinclair Grooms, MD     Important Information About Sugar         Signed, Emmaline Life, NP  04/25/2022 4:45 PM    [place]

## 2022-04-25 NOTE — Patient Instructions (Signed)
Medication Instructions:   Your physician recommends that you continue on your current medications as directed. Please refer to the Current Medication list given to you today.   *If you need a refill on your cardiac medications before your next appointment, please call your pharmacy*   Lab Work:  TODAY!!!!! BMET/LIPID/CBC!!!  If you have labs (blood work) drawn today and your tests are completely normal, you will receive your results only by: [place] (if you have MyChart) OR A paper copy in the mail If you have any lab test that is abnormal or we need to change your treatment, we will call you to review the results.   Testing/Procedures:  Please read letter.    Follow-Up: At [facility], you and your health needs are our priority.  As part of our continuing mission to provide you with exceptional heart care, we have created designated Provider Care Teams.  These Care Teams include your primary Cardiologist (physician) and Advanced Practice Providers (APPs -  Physician Assistants and Nurse Practitioners) who all work together to provide you with the care you need, when you need it.  We recommend signing up for the patient portal called "MyChart".  Sign up information is provided on this After Visit Summary.  MyChart is used to connect with patients for Virtual Visits (Telemedicine).  Patients are able to view lab/test results, encounter notes, upcoming appointments, etc.  Non-urgent messages can be sent to your provider as well.   To learn more about what you can do with MyChart, go to [email or web address].    Your next appointment:   5 week(s)  The format for your next appointment:   In Person  Provider:   Sinclair Grooms, MD     Important Information About Sugar

## 2022-04-26 LAB — CBC
Hematocrit: 41.9 % (ref 34.0–46.6)
Hemoglobin: 14.4 g/dL (ref 11.1–15.9)
MCH: 33.1 pg — ABNORMAL HIGH (ref 26.6–33.0)
MCHC: 34.4 g/dL (ref 31.5–35.7)
MCV: 96 fL (ref 79–97)
Platelets: 177 10*3/uL (ref 150–450)
RBC: 4.35 x10E6/uL (ref 3.77–5.28)
RDW: 12.5 % (ref 11.7–15.4)
WBC: 8.3 10*3/uL (ref 3.4–10.8)

## 2022-04-26 LAB — BASIC METABOLIC PANEL
BUN/Creatinine Ratio: 26 (ref 12–28)
BUN: 26 mg/dL (ref 8–27)
CO2: 27 mmol/L (ref 20–29)
Calcium: 9.6 mg/dL (ref 8.7–10.3)
Chloride: 101 mmol/L (ref 96–106)
Creatinine, Ser: 0.99 mg/dL (ref 0.57–1.00)
Glucose: 100 mg/dL — ABNORMAL HIGH (ref 70–99)
Potassium: 3.7 mmol/L (ref 3.5–5.2)
Sodium: 143 mmol/L (ref 134–144)
eGFR: 58 mL/min/{1.73_m2} — ABNORMAL LOW (ref >59)

## 2022-04-26 LAB — LIPID PANEL
Chol/HDL Ratio: 3.5 ratio (ref 0.0–4.4)
Cholesterol, Total: 139 mg/dL (ref 100–199)
HDL: 40 mg/dL (ref >39)
LDL Chol Calc (NIH): 70 mg/dL (ref 0–99)
Triglycerides: 172 mg/dL — ABNORMAL HIGH (ref 0–149)
VLDL Cholesterol Cal: 29 mg/dL (ref 5–40)

## 2022-04-30 ENCOUNTER — Telehealth: Payer: Self-pay | Admitting: *Deleted

## 2022-04-30 NOTE — H&P (Addendum)
DOE and new severely reduced LVEF for left and Right heart cath. Probable right femoral approach. Will need updated heart failure regimen.

## 2022-04-30 NOTE — Telephone Encounter (Signed)
Cardiac Catheterization scheduled at [facility] for: Tuesday May 01, 2022 9 AM Arrival time and place: [facility] Main Entrance A at: 7 AM  Nothing to eat after midnight prior to procedure, clear liquids until 5 AM day of procedure.  CONTRAST ALLERGY: yes- 13 Prednisone and Benadryl Prep reviewed with patient: 04/30/22 Prednisone 50 mg 8 PM 05/01/22 Prednisone 50 mg 2 AM 05/01/22 Prednisone 50 mg and Benadryl 50 mg just prior to leaving home for hospital Pt advised not to drive.   Medication instructions: -Hold:  Lasix/HCTZ/Lotensin/KCl-day before and day of procedure per protocol GFR 58-pt already taken today -Except hold medications usual morning medications can be taken with sips of water including aspirin 81 mg and Plavix 75 mg  Confirmed patient has responsible adult to drive home post procedure and be with patient first 24 hours after arriving home.  Reviewed procedure instructions with patient.   Patient reports no new symptoms concerning for COVID-19 in the past 10 days.

## 2022-05-01 ENCOUNTER — Encounter (HOSPITAL_COMMUNITY): Payer: Self-pay | Admitting: Interventional Cardiology

## 2022-05-01 ENCOUNTER — Telehealth: Payer: Self-pay | Admitting: Interventional Cardiology

## 2022-05-01 ENCOUNTER — Other Ambulatory Visit: Payer: Self-pay

## 2022-05-01 ENCOUNTER — Encounter (HOSPITAL_COMMUNITY)
Admission: RE | Disposition: A | Discharge status: Home / Self Care | Payer: Self-pay | Source: Home / Self Care | Attending: Interventional Cardiology

## 2022-05-01 ENCOUNTER — Ambulatory Visit (HOSPITAL_COMMUNITY)
Admission: RE | Admit: 2022-05-01 | Discharge: 2022-05-01 | Disposition: A | Discharge status: Home / Self Care | Payer: Medicare Other | Attending: Interventional Cardiology | Admitting: Interventional Cardiology

## 2022-05-01 DIAGNOSIS — I5032 Chronic diastolic (congestive) heart failure: Secondary | ICD-10-CM

## 2022-05-01 DIAGNOSIS — I255 Ischemic cardiomyopathy: Secondary | ICD-10-CM | POA: Diagnosis not present

## 2022-05-01 DIAGNOSIS — I447 Left bundle-branch block, unspecified: Secondary | ICD-10-CM | POA: Diagnosis not present

## 2022-05-01 DIAGNOSIS — R079 Chest pain, unspecified: Secondary | ICD-10-CM | POA: Diagnosis not present

## 2022-05-01 DIAGNOSIS — I2510 Atherosclerotic heart disease of native coronary artery without angina pectoris: Secondary | ICD-10-CM

## 2022-05-01 DIAGNOSIS — Z79899 Other long term (current) drug therapy: Secondary | ICD-10-CM | POA: Insufficient documentation

## 2022-05-01 DIAGNOSIS — E785 Hyperlipidemia, unspecified: Secondary | ICD-10-CM | POA: Diagnosis not present

## 2022-05-01 DIAGNOSIS — I5023 Acute on chronic systolic (congestive) heart failure: Secondary | ICD-10-CM | POA: Diagnosis not present

## 2022-05-01 DIAGNOSIS — I480 Paroxysmal atrial fibrillation: Secondary | ICD-10-CM | POA: Diagnosis not present

## 2022-05-01 DIAGNOSIS — I5043 Acute on chronic combined systolic (congestive) and diastolic (congestive) heart failure: Secondary | ICD-10-CM | POA: Insufficient documentation

## 2022-05-01 DIAGNOSIS — R0609 Other forms of dyspnea: Secondary | ICD-10-CM | POA: Insufficient documentation

## 2022-05-01 DIAGNOSIS — I2582 Chronic total occlusion of coronary artery: Secondary | ICD-10-CM | POA: Diagnosis not present

## 2022-05-01 DIAGNOSIS — I4891 Unspecified atrial fibrillation: Secondary | ICD-10-CM | POA: Diagnosis present

## 2022-05-01 DIAGNOSIS — I25118 Atherosclerotic heart disease of native coronary artery with other forms of angina pectoris: Secondary | ICD-10-CM | POA: Insufficient documentation

## 2022-05-01 DIAGNOSIS — I110 Hypertensive heart disease with heart failure: Secondary | ICD-10-CM | POA: Diagnosis not present

## 2022-05-01 DIAGNOSIS — Z955 Presence of coronary angioplasty implant and graft: Secondary | ICD-10-CM | POA: Insufficient documentation

## 2022-05-01 DIAGNOSIS — I272 Pulmonary hypertension, unspecified: Secondary | ICD-10-CM | POA: Diagnosis not present

## 2022-05-01 DIAGNOSIS — Z951 Presence of aortocoronary bypass graft: Secondary | ICD-10-CM

## 2022-05-01 DIAGNOSIS — I10 Essential (primary) hypertension: Secondary | ICD-10-CM

## 2022-05-01 DIAGNOSIS — I25810 Atherosclerosis of coronary artery bypass graft(s) without angina pectoris: Secondary | ICD-10-CM | POA: Diagnosis present

## 2022-05-01 DIAGNOSIS — I252 Old myocardial infarction: Secondary | ICD-10-CM | POA: Diagnosis not present

## 2022-05-01 HISTORY — PX: RIGHT/LEFT HEART CATH AND CORONARY/GRAFT ANGIOGRAPHY: CATH118267

## 2022-05-01 LAB — POCT I-STAT 7, (LYTES, BLD GAS, ICA,H+H)
Acid-base deficit: 1 mmol/L (ref 0.0–2.0)
Bicarbonate: 22.8 mmol/L (ref 20.0–28.0)
Calcium, Ion: 1.26 mmol/L (ref 1.15–1.40)
HCT: 39 % (ref 36.0–46.0)
Hemoglobin: 13.3 g/dL (ref 12.0–15.0)
O2 Saturation: 96 %
Potassium: 3.3 mmol/L — ABNORMAL LOW (ref 3.5–5.1)
Sodium: 136 mmol/L (ref 135–145)
TCO2: 24 mmol/L (ref 22–32)
pCO2 arterial: 34.6 mmHg (ref 32–48)
pH, Arterial: 7.427 (ref 7.35–7.45)
pO2, Arterial: 78 mmHg — ABNORMAL LOW (ref 83–108)

## 2022-05-01 LAB — POCT I-STAT EG7
Acid-Base Excess: 0 mmol/L (ref 0.0–2.0)
Bicarbonate: 24.4 mmol/L (ref 20.0–28.0)
Calcium, Ion: 1.28 mmol/L (ref 1.15–1.40)
HCT: 40 % (ref 36.0–46.0)
Hemoglobin: 13.6 g/dL (ref 12.0–15.0)
O2 Saturation: 70 %
Potassium: 3.4 mmol/L — ABNORMAL LOW (ref 3.5–5.1)
Sodium: 136 mmol/L (ref 135–145)
TCO2: 26 mmol/L (ref 22–32)
pCO2, Ven: 39.7 mmHg — ABNORMAL LOW (ref 44–60)
pH, Ven: 7.395 (ref 7.25–7.43)
pO2, Ven: 37 mmHg (ref 32–45)

## 2022-05-01 SURGERY — RIGHT/LEFT HEART CATH AND CORONARY/GRAFT ANGIOGRAPHY
Anesthesia: LOCAL

## 2022-05-01 MED ORDER — ASPIRIN 81 MG PO CHEW: 81.0000 mg | CHEWABLE_TABLET | ORAL | Status: DC

## 2022-05-01 MED ORDER — SODIUM CHLORIDE 0.9% FLUSH: 3.0000 mL | Freq: Two times a day (BID) | INTRAVENOUS | Status: DC

## 2022-05-01 MED ORDER — IOHEXOL 350 MG/ML SOLN
INTRAVENOUS | Status: DC | PRN
  Administered 2022-05-01: 120 mL

## 2022-05-01 MED ORDER — HYDRALAZINE HCL 20 MG/ML IJ SOLN
INTRAMUSCULAR | Status: DC | PRN
  Administered 2022-05-01: 10 mg via INTRAVENOUS

## 2022-05-01 MED ORDER — SODIUM CHLORIDE 0.9 % IV SOLN: 250.0000 mL | INTRAVENOUS | Status: DC | PRN

## 2022-05-01 MED ORDER — HEPARIN (PORCINE) IN NACL 1000-0.9 UT/500ML-% IV SOLN
INTRAVENOUS | Status: DC | PRN
  Administered 2022-05-01 (×2): 500 mL

## 2022-05-01 MED ORDER — LIDOCAINE HCL (PF) 1 % IJ SOLN
INTRAMUSCULAR | Status: AC
  Filled 2022-05-01: qty 30

## 2022-05-01 MED ORDER — VERAPAMIL HCL 2.5 MG/ML IV SOLN
INTRAVENOUS | Status: AC
  Filled 2022-05-01: qty 2

## 2022-05-01 MED ORDER — LIDOCAINE HCL (PF) 1 % IJ SOLN
INTRAMUSCULAR | Status: DC | PRN
  Administered 2022-05-01: 5 mL
  Administered 2022-05-01: 2 mL

## 2022-05-01 MED ORDER — ONDANSETRON HCL 4 MG/2ML IJ SOLN: 4.0000 mg | Freq: Four times a day (QID) | INTRAMUSCULAR | Status: DC | PRN

## 2022-05-01 MED ORDER — ACETAMINOPHEN 325 MG PO TABS: 650.0000 mg | ORAL_TABLET | ORAL | Status: DC | PRN

## 2022-05-01 MED ORDER — CARVEDILOL 12.5 MG PO TABS: 12.5000 mg | ORAL_TABLET | Freq: Two times a day (BID) | ORAL | 11 refills | Status: DC

## 2022-05-01 MED ORDER — ENTRESTO 24-26 MG PO TABS: 1.0000 | ORAL_TABLET | Freq: Two times a day (BID) | ORAL | 3 refills | Status: DC

## 2022-05-01 MED ORDER — SODIUM CHLORIDE 0.9 % IV SOLN: INTRAVENOUS | Status: DC

## 2022-05-01 MED ORDER — HEPARIN SODIUM (PORCINE) 1000 UNIT/ML IJ SOLN
INTRAMUSCULAR | Status: AC
  Filled 2022-05-01: qty 10

## 2022-05-01 MED ORDER — HYDRALAZINE HCL 20 MG/ML IJ SOLN: 10.0000 mg | INTRAMUSCULAR | Status: DC | PRN

## 2022-05-01 MED ORDER — SODIUM CHLORIDE 0.9% FLUSH: 3.0000 mL | INTRAVENOUS | Status: DC | PRN

## 2022-05-01 MED ORDER — MIDAZOLAM HCL 2 MG/2ML IJ SOLN
INTRAMUSCULAR | Status: AC
  Filled 2022-05-01: qty 2

## 2022-05-01 MED ORDER — FENTANYL CITRATE (PF) 100 MCG/2ML IJ SOLN
INTRAMUSCULAR | Status: DC | PRN
  Administered 2022-05-01: 50 ug via INTRAVENOUS

## 2022-05-01 MED ORDER — HEPARIN (PORCINE) IN NACL 1000-0.9 UT/500ML-% IV SOLN
INTRAVENOUS | Status: AC
  Filled 2022-05-01: qty 500

## 2022-05-01 MED ORDER — LABETALOL HCL 5 MG/ML IV SOLN: 10.0000 mg | INTRAVENOUS | Status: DC | PRN

## 2022-05-01 MED ORDER — FENTANYL CITRATE (PF) 100 MCG/2ML IJ SOLN
INTRAMUSCULAR | Status: AC
  Filled 2022-05-01: qty 2

## 2022-05-01 MED ORDER — DAPAGLIFLOZIN PROPANEDIOL 10 MG PO TABS: 10.0000 mg | ORAL_TABLET | Freq: Every day | ORAL | 3 refills | Status: DC

## 2022-05-01 MED ORDER — HYDRALAZINE HCL 20 MG/ML IJ SOLN
INTRAMUSCULAR | Status: AC
  Filled 2022-05-01: qty 1

## 2022-05-01 MED ORDER — MIDAZOLAM HCL 2 MG/2ML IJ SOLN
INTRAMUSCULAR | Status: DC | PRN
  Administered 2022-05-01: 1 mg via INTRAVENOUS

## 2022-05-01 SURGICAL SUPPLY — 14 items
CATH INFINITI 5 FR IM (CATHETERS) IMPLANT
CATH INFINITI 5FR MPB2 (CATHETERS) IMPLANT
CATH INFINITI JR4 5F (CATHETERS) IMPLANT
CATH SWAN GANZ 7F STRAIGHT (CATHETERS) IMPLANT
CLOSURE MYNX CONTROL 5F (Vascular Products) IMPLANT
GLIDESHEATH SLENDER 7FR .021G (SHEATH) IMPLANT
GUIDEWIRE .025 260CM (WIRE) IMPLANT
GUIDEWIRE ANGLED .035X150CM (WIRE) IMPLANT
KIT HEART LEFT (KITS) ×1 IMPLANT
PACK CARDIAC CATHETERIZATION (CUSTOM PROCEDURE TRAY) ×1 IMPLANT
SHEATH PINNACLE 5F 10CM (SHEATH) IMPLANT
TRANSDUCER W/STOPCOCK (MISCELLANEOUS) ×1 IMPLANT
TUBING CIL FLEX 10 FLL-RA (TUBING) ×1 IMPLANT
WIRE EMERALD 3MM-J .035X150CM (WIRE) IMPLANT

## 2022-05-01 NOTE — Interval H&P Note (Signed)
Cath Lab Visit (complete for each Cath Lab visit)  Clinical Evaluation Leading to the Procedure:   ACS: No.  Non-ACS:    Anginal Classification: CCS III  Anti-ischemic medical therapy: Maximal Therapy (2 or more classes of medications)  Non-Invasive Test Results: No non-invasive testing performed  Prior CABG: Previous CABG      History and Physical Interval Note:  05/01/2022 9:45 AM  Claudia Howell  has presented today for surgery, with the diagnosis of shortness of breath, chest pain.  The various methods of treatment have been discussed with the patient and family. After consideration of risks, benefits and other options for treatment, the patient has consented to  Procedure(s): RIGHT/LEFT HEART CATH AND CORONARY/GRAFT ANGIOGRAPHY (N/A) as a surgical intervention.  The patient's history has been reviewed, patient examined, no change in status, stable for surgery.  I have reviewed the patient's chart and labs.  Questions were answered to the patient's satisfaction.     Belva Crome III

## 2022-05-01 NOTE — Telephone Encounter (Signed)
Spoke with patient.  Per Dr. Tamala Julian: Stop Benazepril  Stop HCTZ  Stop Atenolol    Start Carvedilol 12.5 mg po BID 05/02/2022 AM  Start  Entresto 24/26 mg BID starting 05/02/2022 AM  Start Farxiga 10 mg daily 05/02/2022   I sent prescriptions to her pharmacy. We can give samples or vouchers for those which apply.   Needs BMET 1 week.   Needs consult with the Advance Heart Failure Team    Reviewed with patient. 4 weeks of samples of Farxiga '10mg'$  left at front desk for pick-up, along with patient assistance information, co-pay card and 30-day free trial coupon for Entresto.  BMET ordered, lab appt scheduled for 05/10/22.  Patient verbalized understanding of the above and expressed appreciation for call.

## 2022-05-01 NOTE — Progress Notes (Signed)
Patient and daughter was given discharge instructions. Both verbalized understanding. 

## 2022-05-01 NOTE — Addendum Note (Signed)
Addended by: Molli Barrows on: 05/01/2022 03:30 PM   Modules accepted: Orders

## 2022-05-01 NOTE — Telephone Encounter (Signed)
-----   Message from Belva Crome, MD sent at 05/01/2022 11:22 AM EST ----- Regarding: Medication adjustments Stop Benazepril Stop HCTZ Stop Atenolol   Start Carvedilol 12.5 mg po BID 05/02/2022 AM Start  Entresto 24/26 mg BID starting 05/02/2022 AM Start Farxiga 10 mg daily 05/02/2022  I sent prescriptions to her pharmacy. We can give samples or vouchers for those which apply.  Needs BMET 1 week.  Needs consult with the Advance Heart Failure Team  Thanks

## 2022-05-01 NOTE — CV Procedure (Signed)
Coronary anatomy is static compared to post RCA intervention in 2020 with patent stents in the right coronary, patent vein graft to the obtuse marginal, patent left main and proximal to mid LAD, competitive flow in the LAD. Occluded graft to the right coronary. Patent LIMA to LAD. Severe pulmonary hypertension with pulmonary artery mean pressure 47 mmHg, mean capillary wedge pressure 32, V wave to 43 mmHg, pulmonary vascular resistance 3.5 Wood units and cardiac output 4.3 L/min.  Cardiac index 2.61 L/min/m. Pulmonary hypertension likely WHO group 2. LV systolic function based on hand-injection reveals EF less than 25%.  LVEDP 27 mmHg.  Findings are consistent with acute on chronic combined systolic and diastolic heart failure

## 2022-05-02 ENCOUNTER — Encounter: Payer: Self-pay | Admitting: Interventional Cardiology

## 2022-05-10 ENCOUNTER — Ambulatory Visit: Payer: Medicare Other | Attending: Interventional Cardiology

## 2022-05-10 DIAGNOSIS — I5032 Chronic diastolic (congestive) heart failure: Secondary | ICD-10-CM

## 2022-05-10 DIAGNOSIS — Z79899 Other long term (current) drug therapy: Secondary | ICD-10-CM

## 2022-05-10 DIAGNOSIS — I10 Essential (primary) hypertension: Secondary | ICD-10-CM

## 2022-05-10 LAB — BASIC METABOLIC PANEL
BUN/Creatinine Ratio: 17 (ref 12–28)
BUN: 17 mg/dL (ref 8–27)
CO2: 28 mmol/L (ref 20–29)
Calcium: 9.3 mg/dL (ref 8.7–10.3)
Chloride: 101 mmol/L (ref 96–106)
Creatinine, Ser: 1 mg/dL (ref 0.57–1.00)
Glucose: 78 mg/dL (ref 70–99)
Potassium: 4.2 mmol/L (ref 3.5–5.2)
Sodium: 139 mmol/L (ref 134–144)
eGFR: 57 mL/min/{1.73_m2} — ABNORMAL LOW (ref >59)

## 2022-05-16 ENCOUNTER — Telehealth: Payer: Self-pay | Admitting: Interventional Cardiology

## 2022-05-16 ENCOUNTER — Encounter (INDEPENDENT_AMBULATORY_CARE_PROVIDER_SITE_OTHER): Payer: Medicare Other | Admitting: Ophthalmology

## 2022-05-16 DIAGNOSIS — I10 Essential (primary) hypertension: Secondary | ICD-10-CM

## 2022-05-16 DIAGNOSIS — Z79899 Other long term (current) drug therapy: Secondary | ICD-10-CM

## 2022-05-16 MED ORDER — SPIRONOLACTONE 25 MG PO TABS: 12.5000 mg | ORAL_TABLET | Freq: Every day | ORAL | 3 refills | Status: DC

## 2022-05-16 NOTE — Telephone Encounter (Signed)
Spoke with patient and discussed Dr. Thompson Caul recommendations based on update from recent lab results.  Per Dr. Tamala Julian: Start spironolactone 12.5 mg daily.  Basic metabolic panel 7 to 10 days.    Spironolactone 12.'5mg'$  sent to pharmacy, medication list updated. BMET ordered, lab appt scheduled for 05/24/22.  Patient verbalized understanding of the above and expressed appreciation for call.

## 2022-05-16 NOTE — Telephone Encounter (Signed)
-----   Message from Belva Crome, MD sent at 05/16/2022  3:50 PM EST ----- Start spironolactone 12.5 mg daily.  Basic metabolic panel 7 to 10 days. ----- Message ----- From: Molli Barrows Sent: 05/16/2022  11:59 AM EST To: Belva Crome, MD  Update: patient states she is doing better on new medications, she is not coughing at night as much and is able to sleep better. She said she has good days and bad days, noting that sometimes she may overexert herself and feel more tired the next day, but is being more mindful of this. She states overall she is doing better on these new medications.  Will forward to Dr. Tamala Julian to review.  The patient has been notified of the result and verbalized understanding.  All questions (if any) were answered. Molli Barrows, RN 05/16/2022 11:58 AM

## 2022-05-21 DIAGNOSIS — H353131 Nonexudative age-related macular degeneration, bilateral, early dry stage: Secondary | ICD-10-CM | POA: Diagnosis not present

## 2022-05-21 DIAGNOSIS — H34231 Retinal artery branch occlusion, right eye: Secondary | ICD-10-CM | POA: Diagnosis not present

## 2022-05-21 DIAGNOSIS — H43813 Vitreous degeneration, bilateral: Secondary | ICD-10-CM | POA: Diagnosis not present

## 2022-05-21 DIAGNOSIS — H348112 Central retinal vein occlusion, right eye, stable: Secondary | ICD-10-CM | POA: Diagnosis not present

## 2022-05-22 NOTE — Patient Instructions (Incomplete)
  HEALTH MAINTENANCE RECOMMENDATIONS:  It is recommended that you get at least 30 minutes of aerobic exercise at least 5 days/week (for weight loss, you may need as much as 60-90 minutes). This can be any activity that gets your heart rate up. This can be divided in 10-15 minute intervals if needed, but try and build up your endurance at least once a week.  Weight bearing exercise is also recommended twice weekly.  Eat a healthy diet with lots of vegetables, fruits and fiber.  "Colorful" foods have a lot of vitamins (ie green vegetables, tomatoes, red peppers, etc).  Limit sweet tea, regular sodas and alcoholic beverages, all of which has a lot of calories and sugar.  Up to 1 alcoholic drink daily may be beneficial for women (unless trying to lose weight, watch sugars).  Drink a lot of water.  Calcium recommendations are 1200-1500 mg daily (1500 mg for postmenopausal women or women without ovaries), and vitamin D 1000 IU daily.  This should be obtained from diet and/or supplements (vitamins), and calcium should not be taken all at once, but in divided doses.  Monthly self breast exams and yearly mammograms for women over the age of 61 is recommended.  Sunscreen of at least SPF 30 should be used on all sun-exposed parts of the skin when outside between the hours of 10 am and 4 pm (not just when at beach or pool, but even with exercise, golf, tennis, and yard work!)  Use a sunscreen that says "broad spectrum" so it covers both UVA and UVB rays, and make sure to reapply every 1-2 hours.  Remember to change the batteries in your smoke detectors when changing your clock times in the spring and fall. Carbon monoxide detectors are recommended for your home.  Use your seat belt every time you are in a car, and please drive safely and not be distracted with cell phones and texting while driving.   Claudia Howell , Thank you for taking time to come for your Medicare Wellness Visit. I appreciate your ongoing  commitment to your health goals. Please review the following plan we discussed and let me know if I can assist you in the future.   This is a list of the screening recommended for you and due dates:  Health Maintenance  Topic Date Due   Mammogram  11/17/2021   COVID-19 Vaccine (5 - 2023-24 season) 02/23/2022   Medicare Annual Wellness Visit  05/11/2022   Pneumonia Vaccine  Completed   Flu Shot  Completed   DEXA scan (bone density measurement)  Completed   Hepatitis C Screening: USPSTF Recommendation to screen - Ages 49-79 yo.  Completed   Zoster (Shingles) Vaccine  Completed   HPV Vaccine  Aged Out   Colon Cancer Screening  Discontinued   RSV vaccine recommended, you need to get this from the pharmacy.  Next bone density test is due 10/2022.   You are past due for yearly mammogram.  [facility] to schedule both of these tests.  Per Dr. Thompson Caul notes, they were referring you to the [facility]. If you haven't heard from them, please ask their office.  (I believe that is Dr. Clayborne [facility] clinic).

## 2022-05-22 NOTE — Progress Notes (Unsigned)
No chief complaint on file.   Claudia Howell is a 81 y.o. female who presents for annual wellness visit and follow-up on chronic medical conditions.   She has the following concerns:  CAD and CHF:  Patient is under the care of Dr. Tamala Julian. She has history of CAD with bypass graft (January/2014), saphenous vein graft stent (10/2012), and unstable angina treated 04/28/2019 with R coronary artery CTO PCI.  She also has hypertension, h/o paroxysmal atrial fibrillation, s/p Maze procedure. Denies any recurrent atrial fibrillation. Denies chest pain, palpitations, tachycardia, shortness of breath, edema.  She had heart cath earlier this month. She had acute on chronic systolic and diastolic HF with EF <23%, regional wall motion abnormality and severe pulmonary HTN.  Report also mentioned that stented graft to circumflex, and LIMA to LAD were widely patent, as was native RCA within the previously stented prox and mid segment; moderate disease distally. Based on these findings, meds were changed--stopped benazepril, HCTZ and atenolol, started carvedilol 12.62m BID, Entresto, Farxiga 127m after f/u labs, spironolactone 12.32m18mas added. Scheduled for b-met with their office 11/30. She continues to take lasix 8m106mily. Cough/breathing improved. Dr. SmitTamala Julianommended consult with the Advance Heart Failure Team. She continues on Plavix. She denies any bleeding.  She had originally been started on Farxiga back in February, stopped related to leg cramps and pain (abdominal). Currently denies these issues.  Hypertension:  Blood pressures have been running  Meds recently changed as reported above. She denies headaches, dizziness, chest pain, palpitations. Edema? She denies any leg cramps (as long as she has some tonic at bedtime). Hasn't had cramps in months, and uses mustard prn.   Hyperlipidemia:  intolerance to zetia and limited tolerance to statins.  She is currently on atorvastatin 10mg47mpids recently  checked by cardiology, at goal with LDL 70 (see below; goal LDL <70) She is taking Coenzyme Q10 daily and is tolerating this statin better than others. (She previously tried Repatha x 3 doses through the lipid clinic, but didn't tolerate due to cold symptoms.)  Constipation: This has been controlled with using a capful of Miralax daily, taking Senna every night (has done this for over 50 years), and eating more vegetables.  She is moving her bowels every morning.    H/o adenomatous colon polyps: Colonoscopy in 10/2014 had tubular adenomas, rec 3 year repeat. This was delayed due to MI and being on aspirin and Plavix.  She went to have the colonoscopy in August, but vomited the prep the morning of the procedure. It was cancelled, and hasn't heard back to reschedule it. [place]'t [facility] [place] [facility] and [place]. She continues under the care of Dr. RegalPaulla Dollyt seen 02/2022, at which time she got injection. Diagnosis Inflammatory capsulitis of the sinus tarsi right along with ankle inflammation pain. Improved? ***  Cerumen impaction--R ear, seen in August.  Referred to ENT. Never seen.   Immunization History  Administered Date(s) Administered   Fluad Quad(high Dose 65+) 03/25/2019, 03/22/2020, 04/05/2021, 04/17/2022   Influenza Split 05/07/2012   Influenza, High Dose Seasonal PF 04/22/2013, 03/25/2014, 04/08/2015, 04/13/2016, 03/27/2017, 05/08/2018   Influenza-Unspecified 04/12/2011   PFIZER(Purple Top)SARS-COV-2 Vaccination 07/08/2019, 07/29/2019, 04/06/2020   Pfizer Covid-19 Vaccine Bivalent Booster 38yrs47yr 04/19/2021   Pneumococcal Conjugate-13 09/09/2014   Pneumococcal Polysaccharide-23 06/30/2009, 05/09/2020   Td 06/25/2002   Tdap 01/23/2009, 07/28/2020   Zoster Recombinat (Shingrix) 03/14/2017, 05/24/2017   Zoster, Live 01/24/2008   Last  Pap smear: n/a (hysterectomy for benign reasons) Last mammogram: 10/2020 Last colonoscopy: 11/2021 with Dr.  Jake Samples tubular adenomas, mild melanosis coli, moderate sigmoid diverticulosis, non-bleeding internal hemorrhoids.  No f/u recommended based on age. Last DEXA: 10/2020 T-2.0 at L wrist (at [facility]).  Prior DEXA was in 09/2011 (T-1.1 L fem neck)--[facility]: twice yearly Ophtho: sees retinal specialist every 8 months. Exercise:   Tai chi class 6x/week. Does some walking, no weights.  Vitamin D-OH level of 35 in 2014.   Patient Care Team: Rita Ohara, MD as PCP - General (Family Medicine) Belva Crome, MD as PCP - Cardiology (Cardiology) Camillo Flaming, [place] as Referring Physician (Optometry) Zadie Rhine, Clent Demark, MD as Consulting Physician (Ophthalmology) Wilford Corner, MD as Consulting Physician (Gastroenterology) Jarome Matin, MD as Consulting Physician (Dermatology) Paulla Dolly Tamala Fothergill, DPM as Consulting Physician (Podiatry) Dr. Elwanda Brooklyn, DDS as Consulting Physician (Dentistry) GI:  Dr. Lyndel Safe (no longer sees Dr. Michail Sermon)   Depression Screening: [place] Office Visit from 05/11/2021 in [place]  PHQ-2 Total Score 0        Falls screen:     05/11/2021    2:50 PM 10/20/2020    2:43 PM 05/09/2020    2:51 PM 05/26/2019   11:25 AM 09/24/2018    9:06 AM  [place] in the past year? 0 0 0 0 0  Number falls in past yr: 0 0  0   Injury with Fall? 0 0  0   Risk for fall due to :    Other (Comment)   Risk for fall due to: Comment    Single Leg Stand Test: 2.18 sec.   Follow up  Falls evaluation completed  Falls evaluation completed      Functional Status Survey:   Forgets some names, nothing more serious        End of Life Discussion:  Patient has a living will and medical power of attorney   PMH, PSH, SH and FH were reviewed and updated    ROS: The patient denies anorexia, fever, headaches, ear pain, sore throat, breast concerns, palpitations, dizziness, syncope, dyspnea on exertion, cough, swelling, nausea, vomiting, diarrhea,  constipation, abdominal pain, melena, hematochezia, indigestion/heartburn, hematuria, incontinence, dysuria, vaginal bleeding, discharge, odor or itch, genital lesions, joint pains, weakness, tremor, suspicious skin lesions, depression, anxiety, abnormal bleeding/bruising, or enlarged lymph nodes. Constipation is controlled per HPI Denies heartburn, indigestion. Leg cramps at night have resolved (drinks tonic at bedtime) R buttock pain/sciatica resolved. Still has some R ankle discomfort--s/p injection in September   PHYSICAL EXAM:  There were no vitals taken for this visit.    Wt Readings from Last 3 Encounters:  05/01/22 130 lb (59 kg)  04/25/22 130 lb (59 kg)  03/21/22 133 lb (60.3 kg)    General Appearance:   Alert, cooperative, no distress, appears stated age    Head:   Normocephalic, without obvious abnormality, atraumatic    Eyes:   PERRL, conjunctiva/corneas clear, EOM's intact, fundi benign    Ears:   Normal TM's and external ear canals    Nose:   No drainage or sinus tenderness  Throat:   Normal mucosa  Neck:   Supple, no lymphadenopathy; thyroid: no enlargement/tenderness/ nodules; no carotid bruit appreciable  Back:   Spine nontender, no curvature, ROM normal, no CVA tenderness    Lungs:   Clear to auscultation bilaterally without wheezes, rales or ronchi; respirations unlabored    Chest Wall:   No tenderness  or deformity. WHSS  Heart:   Regular rate and rhythm, S1 and S2 normal, no murmur, rub or gallop    Breast Exam:   No tenderness, masses, or nipple discharge or inversion. No axillary lymphadenopathy.  WHSS left breast at superior aspect of areola. There is a palpable defect at 12 o'clock where tissue was removed.  No masses.  Abdomen:   Soft, non-tender, nondistended, normoactive bowel sounds, no masses, no hepatosplenomegaly. Small umbilical hernia, nontender, easily reducible.  Genitalia:   Normal external genitalia without lesions; +atrophic changes. BUS and  vagina normal; normal bimanual exam without masses. Uterus is surgically absent.  Rectal:   Deferred (had colonoscopy in June)  Extremities:   No clubbing, cyanosis or edema.  There is a 4x 3 cm soft tissue mass/focal area of swelling at R lateral malleolus. There is no erythema, warmth, nontender. Unchanged from last year.  Pulses:   2+ and symmetric all extremities    Skin:   Skin color, texture, turgor normal, no rashes or lesions. some diffuse thinning noted in scalp, no rash.  Lymph nodes:   Cervical, supraclavicular, and axillary nodes normal    Neurologic:   Normal strength, sensation and gait; reflexes 2+ and symmetric throughout                         Psych:   Normal mood, affect, hygiene and grooming  ***UPDATE if any cerumen impaction on R, small umbilical hernia UPDATE soft tissue swelling/mass R ankle lat malleolus  Recent labs reviewed: Lab Results  Component Value Date   WBC 8.3 04/25/2022   HGB 13.3 05/01/2022   HCT 39.0 05/01/2022   MCV 96 04/25/2022   PLT 177 04/25/2022     Chemistry      Component Value Date/Time   NA 139 05/10/2022 1018   K 4.2 05/10/2022 1018   CL 101 05/10/2022 1018   CO2 28 05/10/2022 1018   BUN 17 05/10/2022 1018   CREATININE 1.00 05/10/2022 1018   CREATININE 1.06 (H) 09/06/2016 1146      Component Value Date/Time   CALCIUM 9.3 05/10/2022 1018   ALKPHOS 67 05/11/2021 1621   AST 18 05/11/2021 1621   ALT 14 05/11/2021 1621   BILITOT 0.3 05/11/2021 1621     Lab Results  Component Value Date   CHOL 139 04/25/2022   HDL 40 04/25/2022   LDLCALC 70 04/25/2022   TRIG 172 (H) 04/25/2022   CHOLHDL 3.5 04/25/2022     ASSESSMENT/PLAN:  COVID booster.  RSV from pharmacy  We referred to ENT for R cerumen impaction in August--doesn't look like she was ever seen. Still having issues?  Remove Dr. Michail Sermon and add Dr. Lyndel Safe as GI  Scheduled for b-met at cardiology 11/30. Hasn't had LFT's done in a year.  ??due c-met today and forward  results? Repeat vitamin D (last done 2014, has osteopenia).    Past due for mammo ?order DEXA, get both done 10/2022??   Discussed monthly self breast exams and yearly mammograms (past due, reminded to schedule); at least 30 minutes of aerobic activity at least 5 days/week and weight-bearing exercise 2x/week; proper sunscreen use reviewed; healthy diet, including goals of calcium and vitamin D intake and alcohol recommendations (less than or equal to 1 drink/day) reviewed; regular seatbelt use; changing batteries in smoke detectors, use of carbon monoxide detectors.  Immunization recommendations discussed--continue yearly high dose flu shots.  COVID booster RSV vaccine Colonoscopy recommendations reviewed, past due  for colonoscopy. Pt hadn't heard back from [facility] after cancelling colonoscopy due to not tolerating prep.  She prefers to go elsewhere. DEXA due 10/2022  MOST form reviewed and unchanged She is Full Code, Full Care   Medicare Attestation I have personally reviewed: The patient's medical and social history Their use of alcohol, tobacco or illicit drugs Their current medications and supplements The patient's functional ability including ADLs,fall risks, home safety risks, cognitive, and hearing and visual impairment Diet and physical activities Evidence for depression or mood disorders  The patient's weight, height, BMI have been recorded in the chart.  I have made referrals, counseling, and provided education to the patient based on review of the above and I have provided the patient with a written personalized care plan for preventive services.     Vikki Ports, MD

## 2022-05-23 ENCOUNTER — Encounter: Payer: Self-pay | Admitting: Family Medicine

## 2022-05-23 ENCOUNTER — Ambulatory Visit (INDEPENDENT_AMBULATORY_CARE_PROVIDER_SITE_OTHER): Payer: Medicare Other | Admitting: Family Medicine

## 2022-05-23 VITALS — BP 120/60 | HR 80 | Ht 64.0 in | Wt 131.2 lb

## 2022-05-23 DIAGNOSIS — Z0000 Encounter for general adult medical examination without abnormal findings: Secondary | ICD-10-CM

## 2022-05-23 DIAGNOSIS — Z780 Asymptomatic menopausal state: Secondary | ICD-10-CM | POA: Diagnosis not present

## 2022-05-23 DIAGNOSIS — I255 Ischemic cardiomyopathy: Secondary | ICD-10-CM

## 2022-05-23 DIAGNOSIS — I5023 Acute on chronic systolic (congestive) heart failure: Secondary | ICD-10-CM | POA: Diagnosis not present

## 2022-05-23 DIAGNOSIS — E7800 Pure hypercholesterolemia, unspecified: Secondary | ICD-10-CM | POA: Diagnosis not present

## 2022-05-23 DIAGNOSIS — M8580 Other specified disorders of bone density and structure, unspecified site: Secondary | ICD-10-CM

## 2022-05-23 DIAGNOSIS — Z5181 Encounter for therapeutic drug level monitoring: Secondary | ICD-10-CM

## 2022-05-23 DIAGNOSIS — K5900 Constipation, unspecified: Secondary | ICD-10-CM

## 2022-05-23 DIAGNOSIS — I25719 Atherosclerosis of autologous vein coronary artery bypass graft(s) with unspecified angina pectoris: Secondary | ICD-10-CM

## 2022-05-23 DIAGNOSIS — I10 Essential (primary) hypertension: Secondary | ICD-10-CM | POA: Diagnosis not present

## 2022-05-23 DIAGNOSIS — I700 Atherosclerosis of aorta: Secondary | ICD-10-CM | POA: Diagnosis not present

## 2022-05-24 ENCOUNTER — Ambulatory Visit: Payer: Medicare Other | Attending: Interventional Cardiology

## 2022-05-24 DIAGNOSIS — I10 Essential (primary) hypertension: Secondary | ICD-10-CM

## 2022-05-24 DIAGNOSIS — Z79899 Other long term (current) drug therapy: Secondary | ICD-10-CM

## 2022-05-24 LAB — HEPATIC FUNCTION PANEL
ALT: 7 IU/L (ref 0–32)
AST: 16 IU/L (ref 0–40)
Albumin: 4.6 g/dL (ref 3.8–4.8)
Alkaline Phosphatase: 57 IU/L (ref 44–121)
Bilirubin Total: 0.4 mg/dL (ref 0.0–1.2)
Bilirubin, Direct: 0.18 mg/dL (ref 0.00–0.40)
Total Protein: 7.2 g/dL (ref 6.0–8.5)

## 2022-05-25 LAB — BASIC METABOLIC PANEL WITH GFR
BUN/Creatinine Ratio: 17 (ref 12–28)
BUN: 18 mg/dL (ref 8–27)
CO2: 24 mmol/L (ref 20–29)
Calcium: 9.7 mg/dL (ref 8.7–10.3)
Chloride: 100 mmol/L (ref 96–106)
Creatinine, Ser: 1.06 mg/dL — ABNORMAL HIGH (ref 0.57–1.00)
Glucose: 95 mg/dL (ref 70–99)
Potassium: 4.1 mmol/L (ref 3.5–5.2)
Sodium: 139 mmol/L (ref 134–144)
eGFR: 53 mL/min/1.73 — ABNORMAL LOW (ref >59)

## 2022-05-28 NOTE — Progress Notes (Unsigned)
Cardiology Office Note:    Date:  05/29/2022   ID:  Claudia Howell, DOB 08-May-1941, MRN [id number]  PCP:  Rita Ohara, MD  Cardiologist:  Sinclair Grooms, MD   Referring MD: Rita Ohara, MD   Chief Complaint  Patient presents with   Congestive Heart Failure    History of Present Illness:    Claudia Howell is a 81 y.o. female with a hx of CAD s/p CABG x 3 06/2012 with MAZE, PCI to SVG in 2014, NSTEMI 05/15/2019 treated with RCA 2 CTO with stent, HTN, HLD, chronic combined CHF, and PAF s/p MAZE     She is accompanied by daughter.  She is still having difficulty lying flat in bed.  Overall breathing is getting better.  She gives out of energy quickly.  She is able to do some things but tires quickly.  She is not lightheaded or dizzy.  She has not had syncope.  Past Medical History:  Diagnosis Date   Adenomatous colon polyp 03/2008   CHF (congestive heart failure) (HCC)    Constipation    Coronary artery disease    Last cath 12/14 Cath 12/8 100% SVG occlusion to RCA, 95% SVG.  The stenosis to OM.  LIMA to LAD patent but diffuse disease in LAD after graft.  Overlapping stents mid/distal SVG to OM.   Decubitus ulcer of coccyx 10/22/2012   1/2 inch raw open area on coccyx   Hyperlipidemia    Hypertension    Dr.Shaughn Thomley Tamala Julian   Myocardial infarction [facility])    Osteopenia 02/2008   PAF (paroxysmal atrial fibrillation) ([place])    during cath/notes 10/16/2012   Right posterior capsular opacification 11/16/2019   S/P CABG x 3 10/22/2012   LIMA to LAD, SVG to distal RCA, SVG to OM, EVH from right thigh   S/P Maze operation for atrial fibrillation 10/22/2012   Left side lesion set using bipolar radiofrequency and cryothermy ablation with clipping of LA appendage   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    Past Surgical History:  Procedure Laterality Date   BREAST CYST EXCISION Left 1980-1990's   x 3   CARDIAC CATHETERIZATION  10/16/2012   CATARACT EXTRACTION, BILATERAL Bilateral     03/2015 and 05/2015   CATARACT EXTRACTION, BILATERAL Bilateral approx 2016   CORONARY ANGIOPLASTY WITH STENT PLACEMENT  04/01/2013   "1" (06/01/2013)   CORONARY ARTERY BYPASS GRAFT N/A 10/22/2012   Procedure: CORONARY ARTERY BYPASS GRAFTING (CABG);  Surgeon: Rexene Alberts, MD;  Location: [place];  Service: Open Heart Surgery;  Laterality: N/A;   CORONARY ATHERECTOMY N/A 04/29/2019   Procedure: CORONARY ATHERECTOMY;  Surgeon: Martinique, Peter M, MD;  Location: [place] CV LAB;  Service: Cardiovascular;  Laterality: N/A;   CORONARY CTO INTERVENTION N/A 04/29/2019   Procedure: CORONARY CTO INTERVENTION;  Surgeon: Martinique, Peter M, MD;  Location: [place] CV LAB;  Service: Cardiovascular;  Laterality: N/A;   CORONARY STENT INTERVENTION N/A 01/01/2019   Procedure: CORONARY STENT INTERVENTION;  Surgeon: Belva Crome, MD;  Location: [place] [facility] LAB;  Service: Cardiovascular;  Laterality: N/A;   DILATION AND CURETTAGE OF UTERUS     INTRAOPERATIVE TRANSESOPHAGEAL ECHOCARDIOGRAM N/A 10/22/2012   Procedure: INTRAOPERATIVE TRANSESOPHAGEAL ECHOCARDIOGRAM;  Surgeon: Rexene Alberts, MD;  Location: [place];  Service: Open Heart Surgery;  Laterality: N/A;   LEFT HEART CATH AND CORS/GRAFTS ANGIOGRAPHY N/A 01/01/2019   Procedure: LEFT HEART CATH AND CORS/GRAFTS ANGIOGRAPHY;  Surgeon: Belva Crome, MD;  Location:  [place] IN[facility]AS[facility];  Service: Cardiovascular;  Laterality: N/A;   LEFT HEART CATH AND CORS/GRAFTS ANGIOGRAPHY N/A 04/28/2019   Procedure: LEFT HEART CATH AND CORS/GRAFTS ANGIOGRAPHY;  Surgeon: Belva Crome, MD;  Location: [place] CV LAB;  Service: Cardiovascular;  Laterality: N/A;   LEFT HEART CATHETERIZATION WITH CORONARY ANGIOGRAM N/A 10/16/2012   Procedure: LEFT HEART CATHETERIZATION WITH CORONARY ANGIOGRAM;  Surgeon: Sinclair Grooms, MD;  Location: [facility] CATH LAB;  Service: Cardiovascular;  Laterality: N/A;   LEFT HEART CATHETERIZATION WITH CORONARY/GRAFT ANGIOGRAM N/A 06/01/2013   Procedure: LEFT  HEART CATHETERIZATION WITH Beatrix Fetters;  Surgeon: Sinclair Grooms, MD;  Location: [place] CATH LAB;  Service: Cardiovascular;  Laterality: N/A;   MAZE N/A 10/22/2012   Procedure: MAZE;  Surgeon: Rexene Alberts, MD;  Location: [place];  Service: Open Heart Surgery;  Laterality: N/A;   PERCUTANEOUS CORONARY STENT INTERVENTION (PCI-S)  06/01/2013   Procedure: PERCUTANEOUS CORONARY STENT INTERVENTION (PCI-S);  Surgeon: Sinclair Grooms, MD;  Location: [facility] CATH LAB;  Service: Cardiovascular;;   RIGHT/LEFT HEART CATH AND CORONARY/GRAFT ANGIOGRAPHY N/A 05/01/2022   Procedure: RIGHT/LEFT HEART CATH AND CORONARY/GRAFT ANGIOGRAPHY;  Surgeon: Belva Crome, MD;  Location: [place] CV LAB;  Service: Cardiovascular;  Laterality: N/A;   TONSILLECTOMY  1949   TOTAL ABDOMINAL HYSTERECTOMY W/ BILATERAL SALPINGOOPHORECTOMY  1990   benign growth    Current Medications: Current Meds  Medication Sig   atorvastatin (LIPITOR) 10 MG tablet Take 1 tablet (10 mg total) by mouth daily.   carvedilol (COREG) 12.5 MG tablet Take 1 tablet (12.5 mg total) by mouth 2 (two) times daily.   cholecalciferol (VITAMIN D) 1000 UNITS tablet Take 1,000 Units by mouth daily.   clopidogrel (PLAVIX) 75 MG tablet Take 75 mg by mouth daily.   Coenzyme Q10 (COQ10 PO) Take 1 capsule by mouth daily.   dapagliflozin propanediol (FARXIGA) 10 MG TABS tablet Take 1 tablet (10 mg total) by mouth daily before breakfast.   diphenhydramine-acetaminophen (TYLENOL PM) 25-500 MG TABS tablet Take 1.5 tablets by mouth at bedtime as needed (sleep).   furosemide (LASIX) 20 MG tablet Take 1 tablet (20 mg total) by mouth daily.   MAGNESIUM PO Take 1 tablet by mouth in the morning and at bedtime.   Multiple Vitamin (MULTIVITAMIN) tablet Take 1 tablet by mouth daily.   nitroGLYCERIN (NITROSTAT) 0.4 MG SL tablet PLACE AND DISSOLVE 1 TABLET UNDER THE TONGUE EVERY 5 MINUTE AS NEEDED FOR CHEST PAIN   polyethylene glycol (MIRALAX / GLYCOLAX) 17 g packet  Take 17 g by mouth daily.   Potassium 99 MG TABS Take 3 tablets by mouth 2 (two) times daily.   sacubitril-valsartan (ENTRESTO) 24-26 MG Take 1 tablet by mouth 2 (two) times daily.   spironolactone (ALDACTONE) 25 MG tablet Take 0.5 tablets (12.5 mg total) by mouth daily.   Current Facility-Administered Medications for the 05/29/22 encounter (Office Visit) with Belva Crome, MD  Medication   sodium chloride flush (NS) 0.9 % injection 3 mL     Allergies:   Contrast media [iodinated contrast media], Pravastatin, Simvastatin, Tramadol hcl, Zetia [ezetimibe], Oxycodone, and Repatha [evolocumab]   Social History   Socioeconomic History   Marital status: Widowed    Spouse name: Not on file   Number of children: 2   Years of education: 43   Highest education level: Associate degree: occupational, Hotel manager, or vocational program  Occupational History   Occupation: retired    Comment: LPN  Tobacco  Use   Smoking status: Never   Smokeless tobacco: Never  Vaping Use   Vaping Use: Never used  Substance and Sexual Activity   Alcohol use: Yes    Alcohol/week: 2.0 standard drinks of alcohol    Types: 2 Glasses of wine per week    Comment: 2/month   Drug use: No   Sexual activity: Not Currently  Other Topics Concern   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  1 son in [place], 1 son in [place]. 7 grandchildren, 2 great granddaughters, 1 great grandson (in [place]), planning to adopt a child from [place] in spring 2024.      Updated 04/2022   Social Determinants of Health   Financial Resource Strain: Low Risk  (05/26/2019)   Overall Financial Resource Strain (CARDIA)    Difficulty of Paying Living Expenses: Not hard at all  Food Insecurity: No Food Insecurity (05/26/2019)   Hunger Vital Sign    Worried About Running Out of Food in the Last Year: Never true    Ran Out of Food in the Last Year: Never true  Transportation Needs: No Transportation Needs (05/26/2019)    PRAPARE - Hydrologist (Medical): No    Lack of Transportation (Non-Medical): No  Physical Activity: Insufficiently Active (05/26/2019)   Exercise Vital Sign    Days of Exercise per Week: 5 days    Minutes of Exercise per Session: 20 min  Stress: No Stress Concern Present (05/26/2019)   [place]    Feeling of Stress : Not at all  Social Connections: Unknown (05/26/2019)   Social Connection and Isolation Panel [NHANES]    Frequency of Communication with Friends and Family: Not asked    Frequency of Social Gatherings with Friends and Family: Not asked    Attends Religious Services: Not on file    Active Member of Clubs or Organizations: Not on file    Attends Archivist Meetings: Not asked    Marital Status: Widowed     Family History: The patient's family history includes Cancer in her brother; Heart attack (age of onset: 21) in her mother; Heart disease in an other family member; Heart disease (age of onset: 16) in her brother; Hypertension in her brother and mother; Prostate cancer in her brother. There is no history of Diabetes, Breast cancer, Colon cancer, Stomach cancer, Rectal cancer, Liver cancer, Pancreatic cancer, or Esophageal cancer.  ROS:   Please see the history of present illness.    Not able to walk or do is much as before because of arthritis in her foot.  All other systems reviewed and are negative.  EKGs/Labs/Other Studies Reviewed:    The following studies were reviewed today: 2 D Doppler ECHOCARDIOGRAM 05/01/2022: Diagnostic Dominance: Right  CONCLUSIONS: Acute on chronic systolic and diastolic heart failure with EF less than 25%, LVEDP 29 mmHg, and cardiac output 4.3 L/min.  Regional wall motion abnormality identified. Severe pulmonary hypertension with PA mean 47 mmHg, pulmonary vascular resistance 3.49 Wood units, and pulmonary capillary wedge pressure  mean 32 mmHg.  V wave to 43 mmHg.  WHO group 2 etiology. Coronary anatomy is unchanged from 2020.  The stented graft to the circumflex is widely patent.  The LIMA to the LAD is widely patent.  The native right coronary is widely patent within the previously stented proximal and mid segment.  Moderate disease is noted  distally. RECOMMENDATIONS: Again attempt to institute guideline directed therapy for systolic heart failure.  ACE inhibitor, Tenormin, and HCTZ will be discontinued.  Entresto 24/26 mg twice daily, carvedilol 12.5 mg p.o. twice daily, and Wilder Glade will be started.  Kidney function will be assessed in 1 week at which time hopefully an MRA can be added. Hemostasis with minx device. Discussed with the daughter, Altha Harm. Will need follow-up blood work in 1 week and OV with APP or me in 1 to 2 weeks. EKG:  EKG not repeated today  Recent Labs: 04/25/2022: Platelets 177 05/01/2022: Hemoglobin 13.3 05/23/2022: ALT 7 05/24/2022: BUN 18; Creatinine, Ser 1.06; Potassium 4.1; Sodium 139  Recent Lipid Panel    Component Value Date/Time   CHOL 139 04/25/2022 1545   TRIG 172 (H) 04/25/2022 1545   HDL 40 04/25/2022 1545   CHOLHDL 3.5 04/25/2022 1545   CHOLHDL 2.9 04/25/2019 0819   VLDL 21 04/25/2019 0819   LDLCALC 70 04/25/2022 1545    Physical Exam:    VS:  BP 122/60   Pulse 83   Ht '5\' 4"'$  (1.626 m)   Wt 131 lb 3.2 oz (59.5 kg)   SpO2 97%   BMI 22.52 kg/m     Wt Readings from Last 3 Encounters:  05/29/22 131 lb 3.2 oz (59.5 kg)  05/23/22 131 lb 3.2 oz (59.5 kg)  05/01/22 130 lb (59 kg)     GEN: Slender and in no distress.. No acute distress HEENT: Normal NECK: No JVD. LYMPHATICS: No lymphadenopathy CARDIAC: Apical soft systolic murmur murmur. RRR S4 gallop, or edema. VASCULAR:  Normal Pulses. No bruits. RESPIRATORY:  Clear to auscultation without rales, wheezing or rhonchi  ABDOMEN: Soft, non-tender, non-distended, No pulsatile mass, MUSCULOSKELETAL: No deformity  SKIN:  Warm and dry NEUROLOGIC:  Alert and oriented x 3 PSYCHIATRIC:  Normal affect   ASSESSMENT:    1. Acute on chronic combined systolic and diastolic CHF, NYHA class 2 ([place])   2. Coronary artery disease of bypass graft of native heart with stable angina pectoris ([place])   3. Essential hypertension   4. S/P Maze operation for atrial fibrillation   5. Hyperlipidemia LDL goal <70   6. Ischemic cardiomyopathy    PLAN:    In order of problems listed above:  Increase Entresto to 49/51 mg p.o. twice daily.  Follow-up in 2 weeks for further titration if tolerated.  Basic metabolic panel at that time.  Will go ahead and make referral to advanced heart failure team, to see Dr. Haroldine Laws. Coronary anatomy is stable.  Continue clopidogrel, Lipitor, and target LDL less than 70. Blood pressure is well-controlled on the advanced heart failure regimen. Continue Lipitor As above.   Guideline directed therapy for left ventricular systolic dysfunction: Angiotensin receptor-neprilysin inhibitor (ARNI)-Entresto; beta-blocker therapy - carvedilol, metoprolol succinate, or bisoprolol; mineralocorticoid receptor antagonist (MRA) therapy -spironolactone or eplerenone.  SGLT-2 agents -  Dapagliflozin Wilder Glade) or Empagliflozin (Jardiance).These therapies have been shown to improve clinical outcomes including reduction of rehospitalization, survival, and acute heart failure.  See Dr. Marlou Porch as primary cardiologist going forward.  After I see her in 2 weeks, she will follow-up with Dr. Marlou Porch in 6 to 8 weeks depending upon the timing of the advanced heart failure clinic visit.  Medication Adjustments/Labs and Tests Ordered: Current medicines are reviewed at length with the patient today.  Concerns regarding medicines are outlined above.  No orders of the defined types were placed in this encounter.  No orders of the defined types were placed  in this encounter.   Patient Instructions  Medication Instructions:   Your physician has recommended you make the following change in your medication:   1) INCREASE Entresto to 49-'51mg'$  twice daily  *If you need a refill on your cardiac medications before your next appointment, please call your pharmacy*  Lab Work: In 2 weeks: BMET (same day as appointment with Dr. Tamala Julian) If you have labs (blood work) drawn today and your tests are completely normal, you will receive your results only by: [place] (if you have MyChart) OR A paper copy in the mail If you have any lab test that is abnormal or we need to change your treatment, we will call you to review the results.  Follow-Up: At [facility], you and your health needs are our priority.  As part of our continuing mission to provide you with exceptional heart care, we have created designated Provider Care Teams.  These Care Teams include your primary Cardiologist (physician) and Advanced Practice Providers (APPs -  Physician Assistants and Nurse Practitioners) who all work together to provide you with the care you need, when you need it.  Your next appointment:   2 week(s)  The format for your next appointment:   In Person  Provider:   Sinclair Grooms, MD  Other Instructions You have been referred to our [facility], their office will call you to schedule an appointment with Dr. Glori Bickers.   Important Information About Sugar         Signed, Sinclair Grooms, MD  05/29/2022 3:04 PM    [facility]

## 2022-05-29 ENCOUNTER — Encounter: Payer: Self-pay | Admitting: Interventional Cardiology

## 2022-05-29 ENCOUNTER — Ambulatory Visit: Payer: Medicare Other | Attending: Interventional Cardiology | Admitting: Interventional Cardiology

## 2022-05-29 VITALS — BP 122/60 | HR 83 | Ht 64.0 in | Wt 131.2 lb

## 2022-05-29 DIAGNOSIS — Z9889 Other specified postprocedural states: Secondary | ICD-10-CM | POA: Insufficient documentation

## 2022-05-29 DIAGNOSIS — I10 Essential (primary) hypertension: Secondary | ICD-10-CM | POA: Diagnosis not present

## 2022-05-29 DIAGNOSIS — E785 Hyperlipidemia, unspecified: Secondary | ICD-10-CM | POA: Diagnosis not present

## 2022-05-29 DIAGNOSIS — I25708 Atherosclerosis of coronary artery bypass graft(s), unspecified, with other forms of angina pectoris: Secondary | ICD-10-CM | POA: Insufficient documentation

## 2022-05-29 DIAGNOSIS — I255 Ischemic cardiomyopathy: Secondary | ICD-10-CM | POA: Diagnosis not present

## 2022-05-29 DIAGNOSIS — Z8679 Personal history of other diseases of the circulatory system: Secondary | ICD-10-CM | POA: Insufficient documentation

## 2022-05-29 DIAGNOSIS — I5043 Acute on chronic combined systolic (congestive) and diastolic (congestive) heart failure: Secondary | ICD-10-CM | POA: Diagnosis not present

## 2022-05-29 MED ORDER — DAPAGLIFLOZIN PROPANEDIOL 10 MG PO TABS: 10.0000 mg | ORAL_TABLET | Freq: Every day | ORAL | 3 refills | Status: DC

## 2022-05-29 MED ORDER — SACUBITRIL-VALSARTAN 49-51 MG PO TABS: 1.0000 | ORAL_TABLET | Freq: Two times a day (BID) | ORAL | 11 refills | Status: DC

## 2022-05-29 NOTE — Patient Instructions (Addendum)
Medication Instructions:  Your physician has recommended you make the following change in your medication:   1) INCREASE Entresto to 49-'51mg'$  twice daily  *If you need a refill on your cardiac medications before your next appointment, please call your pharmacy*  Lab Work: In 2 weeks: BMET (same day as appointment with Dr. Tamala Julian) If you have labs (blood work) drawn today and your tests are completely normal, you will receive your results only by: [place] (if you have MyChart) OR A paper copy in the mail If you have any lab test that is abnormal or we need to change your treatment, we will call you to review the results.  Follow-Up: At [facility], you and your health needs are our priority.  As part of our continuing mission to provide you with exceptional heart care, we have created designated Provider Care Teams.  These Care Teams include your primary Cardiologist (physician) and Advanced Practice Providers (APPs -  Physician Assistants and Nurse Practitioners) who all work together to provide you with the care you need, when you need it.  Your next appointment:   2 week(s) 06/11/2022 at 11:40AM  The format for your next appointment:   In Person  Provider:   Sinclair Grooms, MD  Other Instructions You have been referred to our [facility], their office will call you to schedule an appointment with Dr. Glori Bickers.   Important Information About Sugar

## 2022-06-10 NOTE — Progress Notes (Unsigned)
Cardiology Office Note:    Date:  06/11/2022   ID:  Claudia Howell, DOB 04/13/1941, MRN [id number]  PCP:  Rita Ohara, MD  Cardiologist:  Sinclair Grooms, MD   Referring MD: Rita Ohara, MD   Chief Complaint  Patient presents with   Coronary Artery Disease   Congestive Heart Failure   Hypertension    History of Present Illness:    Claudia Howell is a 81 y.o. female with a hx of CAD s/p CABG x 3 06/2012 with MAZE, PCI to SVG in 2014, NSTEMI 05/15/2019 treated with RCA CTO treated with stent, HTN, HLD, chronic combined CHF, and PAF s/p MAZE    She now feels better.  She has better exertional tolerance.  Not short of breath with physical activity.  Denies angina.  Just took her medications about an hour and a half ago.  She was able to walk in and does not have any particular complaints despite the blood pressure noted below.  She denies lower extremity swelling, orthopnea, and PND.  No palpitations.  No nitroglycerin use.  Past Medical History:  Diagnosis Date   Adenomatous colon polyp 03/2008   CHF (congestive heart failure) (HCC)    Constipation    Coronary artery disease    Last cath 12/14 Cath 12/8 100% SVG occlusion to RCA, 95% SVG.  The stenosis to OM.  LIMA to LAD patent but diffuse disease in LAD after graft.  Overlapping stents mid/distal SVG to OM.   Decubitus ulcer of coccyx 10/22/2012   1/2 inch raw open area on coccyx   Hyperlipidemia    Hypertension    Dr.Dylana Shaw Tamala Julian   Myocardial infarction [facility])    Osteopenia 02/2008   PAF (paroxysmal atrial fibrillation) ([place])    during cath/notes 10/16/2012   Right posterior capsular opacification 11/16/2019   S/P CABG x 3 10/22/2012   LIMA to LAD, SVG to distal RCA, SVG to OM, EVH from right thigh   S/P Maze operation for atrial fibrillation 10/22/2012   Left side lesion set using bipolar radiofrequency and cryothermy ablation with clipping of LA appendage   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    Past Surgical  History:  Procedure Laterality Date   BREAST CYST EXCISION Left 1980-1990's   x 3   CARDIAC CATHETERIZATION  10/16/2012   CATARACT EXTRACTION, BILATERAL Bilateral    03/2015 and 05/2015   CATARACT EXTRACTION, BILATERAL Bilateral approx 2016   CORONARY ANGIOPLASTY WITH STENT PLACEMENT  04/01/2013   "1" (06/01/2013)   CORONARY ARTERY BYPASS GRAFT N/A 10/22/2012   Procedure: CORONARY ARTERY BYPASS GRAFTING (CABG);  Surgeon: Rexene Alberts, MD;  Location: [place];  Service: Open Heart Surgery;  Laterality: N/A;   CORONARY ATHERECTOMY N/A 04/29/2019   Procedure: CORONARY ATHERECTOMY;  Surgeon: Martinique, Peter M, MD;  Location: [place] CV LAB;  Service: Cardiovascular;  Laterality: N/A;   CORONARY CTO INTERVENTION N/A 04/29/2019   Procedure: CORONARY CTO INTERVENTION;  Surgeon: Martinique, Peter M, MD;  Location: [place] [facility];  Service: Cardiovascular;  Laterality: N/A;   CORONARY STENT INTERVENTION N/A 01/01/2019   Procedure: CORONARY STENT INTERVENTION;  Surgeon: Belva Crome, MD;  Location: [place] CV LAB;  Service: Cardiovascular;  Laterality: N/A;   DILATION AND CURETTAGE OF UTERUS     INTRAOPERATIVE TRANSESOPHAGEAL ECHOCARDIOGRAM N/A 10/22/2012   Procedure: INTRAOPERATIVE TRANSESOPHAGEAL ECHOCARDIOGRAM;  Surgeon: Rexene Alberts, MD;  Location: [place];  Service: Open Heart Surgery;  Laterality: N/A;   LEFT  HEART CATH AND CORS/GRAFTS ANGIOGRAPHY N/A 01/01/2019   Procedure: LEFT HEART CATH AND CORS/GRAFTS ANGIOGRAPHY;  Surgeon: Belva Crome, MD;  Location: [place] [facility];  Service: Cardiovascular;  Laterality: N/A;   LEFT HEART CATH AND CORS/GRAFTS ANGIOGRAPHY N/A 04/28/2019   Procedure: LEFT HEART CATH AND CORS/GRAFTS ANGIOGRAPHY;  Surgeon: Belva Crome, MD;  Location: [place] [facility];  Service: Cardiovascular;  Laterality: N/A;   LEFT HEART CATHETERIZATION WITH CORONARY ANGIOGRAM N/A 10/16/2012   Procedure: LEFT HEART CATHETERIZATION WITH CORONARY ANGIOGRAM;  Surgeon: Sinclair Grooms, MD;  Location: [facility] CATH LAB;  Service: Cardiovascular;  Laterality: N/A;   LEFT HEART CATHETERIZATION WITH CORONARY/GRAFT ANGIOGRAM N/A 06/01/2013   Procedure: LEFT HEART CATHETERIZATION WITH Beatrix Fetters;  Surgeon: Sinclair Grooms, MD;  Location: [facility] CATH LAB;  Service: Cardiovascular;  Laterality: N/A;   MAZE N/A 10/22/2012   Procedure: MAZE;  Surgeon: Rexene Alberts, MD;  Location: [place];  Service: Open Heart Surgery;  Laterality: N/A;   PERCUTANEOUS CORONARY STENT INTERVENTION (PCI-S)  06/01/2013   Procedure: PERCUTANEOUS CORONARY STENT INTERVENTION (PCI-S);  Surgeon: Sinclair Grooms, MD;  Location: [facility] CATH LAB;  Service: Cardiovascular;;   RIGHT/LEFT HEART CATH AND CORONARY/GRAFT ANGIOGRAPHY N/A 05/01/2022   Procedure: RIGHT/LEFT HEART CATH AND CORONARY/GRAFT ANGIOGRAPHY;  Surgeon: Belva Crome, MD;  Location: [place] CV LAB;  Service: Cardiovascular;  Laterality: N/A;   TONSILLECTOMY  1949   TOTAL ABDOMINAL HYSTERECTOMY W/ BILATERAL SALPINGOOPHORECTOMY  1990   benign growth    Current Medications: Current Meds  Medication Sig   atorvastatin (LIPITOR) 10 MG tablet Take 1 tablet (10 mg total) by mouth daily.   carvedilol (COREG) 12.5 MG tablet Take 1 tablet (12.5 mg total) by mouth 2 (two) times daily.   cholecalciferol (VITAMIN D) 1000 UNITS tablet Take 1,000 Units by mouth daily.   clopidogrel (PLAVIX) 75 MG tablet Take 75 mg by mouth daily.   Coenzyme Q10 (COQ10 PO) Take 1 capsule by mouth daily.   dapagliflozin propanediol (FARXIGA) 10 MG TABS tablet Take 1 tablet (10 mg total) by mouth daily before breakfast.   diphenhydramine-acetaminophen (TYLENOL PM) 25-500 MG TABS tablet Take 1.5 tablets by mouth at bedtime as needed (sleep).   furosemide (LASIX) 20 MG tablet Take 1 tablet (20 mg total) by mouth daily.   MAGNESIUM PO Take 1 tablet by mouth in the morning and at bedtime.   Multiple Vitamin (MULTIVITAMIN) tablet Take 1 tablet by mouth daily.   nitroGLYCERIN  (NITROSTAT) 0.4 MG SL tablet PLACE AND DISSOLVE 1 TABLET UNDER THE TONGUE EVERY 5 MINUTE AS NEEDED FOR CHEST PAIN   polyethylene glycol (MIRALAX / GLYCOLAX) 17 g packet Take 17 g by mouth daily.   Potassium 99 MG TABS Take 3 tablets by mouth 2 (two) times daily.   sacubitril-valsartan (ENTRESTO) 49-51 MG Take 1 tablet by mouth 2 (two) times daily.   spironolactone (ALDACTONE) 25 MG tablet Take 0.5 tablets (12.5 mg total) by mouth daily.   Current Facility-Administered Medications for the 06/11/22 encounter (Office Visit) with Belva Crome, MD  Medication   sodium chloride flush (NS) 0.9 % injection 3 mL     Allergies:   Contrast media [iodinated contrast media], Pravastatin, Simvastatin, Tramadol hcl, Zetia [ezetimibe], Oxycodone, and Repatha [evolocumab]   Social History   Socioeconomic History   Marital status: Widowed    Spouse name: Not on file   Number of children: 2   Years of education: 20  Highest education level: Associate degree: occupational, Hotel manager, or vocational program  Occupational History   Occupation: retired    Comment: LPN  Tobacco Use   Smoking status: Never   Smokeless tobacco: Never  Vaping Use   Vaping Use: Never used  Substance and Sexual Activity   Alcohol use: Yes    Alcohol/week: 2.0 standard drinks of alcohol    Types: 2 Glasses of wine per week    Comment: 2/month   Drug use: No   Sexual activity: Not Currently  Other Topics Concern   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  1 son in [place], 1 son in [place]. 7 grandchildren, 2 great granddaughters, 1 great grandson (in [place]), planning to adopt a child from [place] in spring 2024.      Updated 04/2022   Social Determinants of Health   Financial Resource Strain: Low Risk  (05/26/2019)   Overall Financial Resource Strain (CARDIA)    Difficulty of Paying Living Expenses: Not hard at all  Food Insecurity: No Food Insecurity (05/26/2019)   Hunger Vital Sign     Worried About Running Out of Food in the Last Year: Never true    Ran Out of Food in the Last Year: Never true  Transportation Needs: No Transportation Needs (05/26/2019)   PRAPARE - Hydrologist (Medical): No    Lack of Transportation (Non-Medical): No  Physical Activity: Insufficiently Active (05/26/2019)   Exercise Vital Sign    Days of Exercise per Week: 5 days    Minutes of Exercise per Session: 20 min  Stress: No Stress Concern Present (05/26/2019)   [place]    Feeling of Stress : Not at all  Social Connections: Unknown (05/26/2019)   Social Connection and Isolation Panel [NHANES]    Frequency of Communication with Friends and Family: Not asked    Frequency of Social Gatherings with Friends and Family: Not asked    Attends Religious Services: Not on file    Active Member of Clubs or Organizations: Not on file    Attends Archivist Meetings: Not asked    Marital Status: Widowed     Family History: The patient's family history includes Cancer in her brother; Heart attack (age of onset: 63) in her mother; Heart disease in an other family member; Heart disease (age of onset: 5) in her brother; Hypertension in her brother and mother; Prostate cancer in her brother. There is no history of Diabetes, Breast cancer, Colon cancer, Stomach cancer, Rectal cancer, Liver cancer, Pancreatic cancer, or Esophageal cancer.  ROS:   Please see the history of present illness.    She is quite pleased and feels better than she has in quite some time.  All other systems reviewed and are negative.  EKGs/Labs/Other Studies Reviewed:    The following studies were reviewed today:  2D Doppler echocardiogram 02/09/2022: IMPRESSIONS   1. LV function is severely depressed with akinesis of the base/mid  septum, base/mid inferior, distal lateral and apical walls; Hypokinesis  elsewhere except for base/mid  anteiror wall that appears to thicken more  normally. Compared to previous echo in  2020, LV function is now severely reduced.. Left ventricular ejection  fraction, by estimation, is 20 to 25%. The left ventricle has severely  decreased function. Left ventricular diastolic parameters are consistent  with Grade III diastolic dysfunction  (restrictive). Elevated left atrial pressure.  2. Right ventricular systolic function is moderately reduced. The right  ventricular size is normal.   3. Right atrial size was mildly dilated.   4. Mild mitral valve regurgitation.   5. The aortic valve is tricuspid. Aortic valve regurgitation is trivial.    Coronary angiography 05/01/2022: CONCLUSIONS: Acute on chronic systolic and diastolic heart failure with EF less than 25%, LVEDP 29 mmHg, and cardiac output 4.3 L/min.  Regional wall motion abnormality identified. Severe pulmonary hypertension with PA mean 47 mmHg, pulmonary vascular resistance 3.49 Wood units, and pulmonary capillary wedge pressure mean 32 mmHg.  V wave to 43 mmHg.  WHO group 2 etiology. Coronary anatomy is unchanged from 2020.  The stented graft to the circumflex is widely patent.  The LIMA to the LAD is widely patent.  The native right coronary is widely patent within the previously stented proximal and mid segment.  Moderate disease is noted distally. RECOMMENDATIONS: Again attempt to institute guideline directed therapy for systolic heart failure.  ACE inhibitor, Tenormin, and HCTZ will be discontinued.  Entresto 24/26 mg twice daily, carvedilol 12.5 mg p.o. twice daily, and Wilder Glade will be started.  Kidney function will be assessed in 1 week at which time hopefully an MRA can be added. Hemostasis with minx device. Discussed with the daughter, Altha Harm. Will need follow-up blood work in 1 week and OV with APP or me in 1 to 2 weeks.  EKG:  EKG not repeated  Recent Labs: 04/25/2022: Platelets 177 05/01/2022: Hemoglobin  13.3 05/23/2022: ALT 7 05/24/2022: BUN 18; Creatinine, Ser 1.06; Potassium 4.1; Sodium 139  Recent Lipid Panel    Component Value Date/Time   CHOL 139 04/25/2022 1545   TRIG 172 (H) 04/25/2022 1545   HDL 40 04/25/2022 1545   CHOLHDL 3.5 04/25/2022 1545   CHOLHDL 2.9 04/25/2019 0819   VLDL 21 04/25/2019 0819   LDLCALC 70 04/25/2022 1545    Physical Exam:    VS:  BP (!) 97/58   Pulse 84   Ht '5\' 4"'$  (1.626 m)   Wt 127 lb 9.6 oz (57.9 kg)   SpO2 99%   BMI 21.90 kg/m     Wt Readings from Last 3 Encounters:  06/11/22 127 lb 9.6 oz (57.9 kg)  05/29/22 131 lb 3.2 oz (59.5 kg)  05/23/22 131 lb 3.2 oz (59.5 kg)     GEN: She has lost 3 pounds since the last office visit. No acute distress HEENT: Normal NECK: No JVD. LYMPHATICS: No lymphadenopathy CARDIAC: No murmur. RRR no gallop, or edema. VASCULAR:  Normal Pulses. No bruits. RESPIRATORY:  Clear to auscultation without rales, wheezing or rhonchi  ABDOMEN: Soft, non-tender, non-distended, No pulsatile mass, MUSCULOSKELETAL: No deformity  SKIN: Warm and dry NEUROLOGIC:  Alert and oriented x 3 PSYCHIATRIC:  Normal affect   ASSESSMENT:    1. Acute on chronic combined systolic and diastolic CHF, NYHA class 2 ([place])   2. Coronary artery disease of bypass graft of native heart with stable angina pectoris ([place])   3. Essential hypertension   4. Paroxysmal atrial fibrillation (HCC)   5. Left bundle branch block (LBBB)   6. Other hyperlipidemia    PLAN:    In order of problems listed above:  Not volume overloaded.  Feeling much better.  Will get a basic metabolic panel today.  May be slightly volume contracted and may have an opportunity to cut back on loop diuretic on current therapy. Notify us if angina.  Continue preventive therapy. Relatively low blood pressure.  Will decrease carvedilol to 6.25 mg p.o. twice daily. No clinical recurrence of atrial fibrillation.  Continue to monitor clinically. She has left bundle branch  block with QRSD 164 ms.  Consideration of resynchronization therapy to further improve LV function and in setting of low EF if no improvement on optimal medical therapy in 3 to 4 months. Continue statin therapy.   Have requested that she follow-up in the advanced heart failure clinic  Have assigned her to Dr. Drucie Ip as general cardiology.  Basic metabolic panel today.   Medication Adjustments/Labs and Tests Ordered: Current medicines are reviewed at length with the patient today.  Concerns regarding medicines are outlined above.  No orders of the defined types were placed in this encounter.  No orders of the defined types were placed in this encounter.   There are no Patient Instructions on file for this visit.   Signed, Sinclair Grooms, MD  06/11/2022 12:01 PM    [place]

## 2022-06-11 ENCOUNTER — Ambulatory Visit: Payer: Medicare Other

## 2022-06-11 ENCOUNTER — Ambulatory Visit: Payer: Medicare Other | Attending: Interventional Cardiology | Admitting: Interventional Cardiology

## 2022-06-11 ENCOUNTER — Encounter: Payer: Self-pay | Admitting: Interventional Cardiology

## 2022-06-11 VITALS — BP 97/58 | HR 84 | Ht 64.0 in | Wt 127.6 lb

## 2022-06-11 DIAGNOSIS — I255 Ischemic cardiomyopathy: Secondary | ICD-10-CM | POA: Diagnosis not present

## 2022-06-11 DIAGNOSIS — E7849 Other hyperlipidemia: Secondary | ICD-10-CM | POA: Diagnosis not present

## 2022-06-11 DIAGNOSIS — I5043 Acute on chronic combined systolic (congestive) and diastolic (congestive) heart failure: Secondary | ICD-10-CM | POA: Insufficient documentation

## 2022-06-11 DIAGNOSIS — I480 Paroxysmal atrial fibrillation: Secondary | ICD-10-CM | POA: Diagnosis not present

## 2022-06-11 DIAGNOSIS — I10 Essential (primary) hypertension: Secondary | ICD-10-CM | POA: Insufficient documentation

## 2022-06-11 DIAGNOSIS — I25708 Atherosclerosis of coronary artery bypass graft(s), unspecified, with other forms of angina pectoris: Secondary | ICD-10-CM | POA: Insufficient documentation

## 2022-06-11 DIAGNOSIS — I447 Left bundle-branch block, unspecified: Secondary | ICD-10-CM | POA: Diagnosis not present

## 2022-06-11 MED ORDER — CARVEDILOL 6.25 MG PO TABS: 6.2500 mg | ORAL_TABLET | Freq: Two times a day (BID) | ORAL | 3 refills | Status: DC

## 2022-06-11 MED ORDER — ATORVASTATIN CALCIUM 10 MG PO TABS: 10.0000 mg | ORAL_TABLET | Freq: Every day | ORAL | 3 refills | Status: DC

## 2022-06-11 NOTE — Patient Instructions (Signed)
Medication Instructions:  Your physician has recommended you make the following change in your medication:   1) DECREASE carvedilol (COREG) to 6.'25mg'$  twice daily  *If you need a refill on your cardiac medications before your next appointment, please call your pharmacy*  Lab Work: TODAY: BMET If you have labs (blood work) drawn today and your tests are completely normal, you will receive your results only by: [place] (if you have MyChart) OR A paper copy in the mail If you have any lab test that is abnormal or we need to change your treatment, we will call you to review the results.  Follow-Up: At [facility], you and your health needs are our priority.  As part of our continuing mission to provide you with exceptional heart care, we have created designated Provider Care Teams.  These Care Teams include your primary Cardiologist (physician) and Advanced Practice Providers (APPs -  Physician Assistants and Nurse Practitioners) who all work together to provide you with the care you need, when you need it.  Your next appointment:   6 week(s)  The format for your next appointment:   In Person  Provider:   Candee Furbish, MD or Rudean Haskell, MD or Gwyndolyn Kaufman, MD  Important Information About Sugar

## 2022-06-12 ENCOUNTER — Encounter: Payer: Self-pay | Admitting: Interventional Cardiology

## 2022-06-12 LAB — BASIC METABOLIC PANEL
BUN/Creatinine Ratio: 17 (ref 12–28)
BUN: 22 mg/dL (ref 8–27)
CO2: 22 mmol/L (ref 20–29)
Calcium: 9.5 mg/dL (ref 8.7–10.3)
Chloride: 101 mmol/L (ref 96–106)
Creatinine, Ser: 1.28 mg/dL — ABNORMAL HIGH (ref 0.57–1.00)
Glucose: 95 mg/dL (ref 70–99)
Potassium: 4.2 mmol/L (ref 3.5–5.2)
Sodium: 139 mmol/L (ref 134–144)
eGFR: 43 mL/min/{1.73_m2} — ABNORMAL LOW (ref >59)

## 2022-06-14 ENCOUNTER — Telehealth: Payer: Self-pay | Admitting: Interventional Cardiology

## 2022-06-14 MED ORDER — FUROSEMIDE 20 MG PO TABS: 20.0000 mg | ORAL_TABLET | ORAL | Status: DC

## 2022-06-14 NOTE — Telephone Encounter (Signed)
-----   Message from Belva Crome, MD sent at 06/13/2022  5:05 PM EST ----- Let the patient know decrease furosemide to Monday, Wednesday, and Friday.  Go back to daily dosing if shortness of breath recurs or weight starts to increase.  Cutting back on the diuretic will help with blood pressure and decrease stress on kidney function. A copy will be sent to Stacie Glaze, DO

## 2022-06-14 NOTE — Telephone Encounter (Signed)
Discussed lab results with patient.  Per Dr. Tamala Julian: Let the patient know decrease furosemide to Monday, Wednesday, and Friday. Go back to daily dosing if shortness of breath recurs or weight starts to increase. Cutting back on the diuretic will help with blood pressure and decrease stress on kidney function.   Furosemide frequency updated on med list.  Patient verbalized understanding of the above.

## 2022-07-06 ENCOUNTER — Other Ambulatory Visit: Payer: Self-pay

## 2022-07-06 MED ORDER — CLOPIDOGREL BISULFATE 75 MG PO TABS: 75.0000 mg | ORAL_TABLET | Freq: Every day | ORAL | 3 refills | Status: DC

## 2022-07-28 NOTE — Progress Notes (Unsigned)
Cardiology Office Note:    Date:  07/30/2022   ID:  Claudia Howell, DOB 1941-03-16, MRN [id number]  PCP:  Rita Ohara, MD   [facility] Cardiologist:  Sinclair Grooms, MD (Inactive) {  Referring MD: Rita Ohara, MD    History of Present Illness:    Claudia Howell is a 82 y.o. female with a hx of CAD s/p CABG x3 in 2014 with MAZE, PCI to SVG in 2014, NSTEMI in 04/2019 treated with PCI to RCA, HTN, HLD, pAFib, and chronic combined systolic and diastolic HF who was previously followed by Dr. Tamala Howell who now presents to clinic for follow-up.  Last cath 04/2022 with patent LIMA to LAD, patent RCA with patent RCA stents, patent Lcx stent. Stable anatomy since 2020. RHC with EF <25%, severe pulmonary HTN with PASP 61mHg, PVR 3.49 WU, PCWP 360mg. TTE 03/2022 with EF 20-25%, G3DD, akinesis of the basal-mid inferior, distal lateral apical walls, hypokinesis elsewhere, G3DD, normal RV, mild MR.  Was last seen by Dr. SmTamala Juliann 06/11/22 where she was doing well.  Today, the patient states that she overall feels so much better. No chest pain, SOB, LE edema, or palpitations. Currently taking lasix every other day. Tolerating medications as prescribed. Blood pressure is well controlled and at goal <130/90.  Past Medical History:  Diagnosis Date   Adenomatous colon polyp 03/2008   CHF (congestive heart failure) (HCC)    Constipation    Coronary artery disease    Last cath 12/14 Cath 12/8 100% SVG occlusion to RCA, 95% SVG.  The stenosis to OM.  LIMA to LAD patent but diffuse disease in LAD after graft.  Overlapping stents mid/distal SVG to OM.   Decubitus ulcer of coccyx 10/22/2012   1/2 inch raw open area on coccyx   Hyperlipidemia    Hypertension    [facility]   Osteopenia 02/2008   PAF (paroxysmal atrial fibrillation) ([facility]   during cath/notes 10/16/2012   Right posterior capsular opacification 11/16/2019   S/P CABG x 3 10/22/2012   LIMA  to LAD, SVG to distal RCA, SVG to OM, EVH from right thigh   S/P Maze operation for atrial fibrillation 10/22/2012   Left side lesion set using bipolar radiofrequency and cryothermy ablation with clipping of LA appendage   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    Past Surgical History:  Procedure Laterality Date   BREAST CYST EXCISION Left 1980-1990's   x 3   CARDIAC CATHETERIZATION  10/16/2012   CATARACT EXTRACTION, BILATERAL Bilateral    03/2015 and 05/2015   CATARACT EXTRACTION, BILATERAL Bilateral approx 2016   CORONARY ANGIOPLASTY WITH STENT PLACEMENT  04/01/2013   "1" (06/01/2013)   CORONARY ARTERY BYPASS GRAFT N/A 10/22/2012   Procedure: CORONARY ARTERY BYPASS GRAFTING (CABG);  Surgeon: ClRexene AlbertsMD;  [facility] Service: Open Heart Surgery;  Laterality: N/A;   CORONARY ATHERECTOMY N/A 04/29/2019   Procedure: CORONARY ATHERECTOMY;  Surgeon: JoMartiniquePeter M, MD;  [facility];  Service: Cardiovascular;  Laterality: N/A;   CORONARY CTO INTERVENTION N/A 04/29/2019   Procedure: CORONARY CTO INTERVENTION;  Surgeon: JoMartiniquePeter M, MD;  [facility];  Service: Cardiovascular;  Laterality: N/A;   CORONARY STENT INTERVENTION N/A 01/01/2019   Procedure: CORONARY STENT INTERVENTION;  Surgeon: SmBelva CromeMD;  [facility];  Service: Cardiovascular;  Laterality: N/A;   DILATION AND  CURETTAGE OF UTERUS     INTRAOPERATIVE TRANSESOPHAGEAL ECHOCARDIOGRAM N/A 10/22/2012   Procedure: INTRAOPERATIVE TRANSESOPHAGEAL ECHOCARDIOGRAM;  Surgeon: Rexene Alberts, MD;  Location: [place];  Service: Open Heart Surgery;  Laterality: N/A;   LEFT HEART CATH AND CORS/GRAFTS ANGIOGRAPHY N/A 01/01/2019   Procedure: LEFT HEART CATH AND CORS/GRAFTS ANGIOGRAPHY;  Surgeon: Belva Crome, MD;  Location: [place] [facility] LAB;  Service: Cardiovascular;  Laterality: N/A;   LEFT HEART CATH AND CORS/GRAFTS ANGIOGRAPHY N/A 04/28/2019   Procedure: LEFT HEART CATH  AND CORS/GRAFTS ANGIOGRAPHY;  Surgeon: Belva Crome, MD;  Location: [place] CV LAB;  Service: Cardiovascular;  Laterality: N/A;   LEFT HEART CATHETERIZATION WITH CORONARY ANGIOGRAM N/A 10/16/2012   Procedure: LEFT HEART CATHETERIZATION WITH CORONARY ANGIOGRAM;  Surgeon: Sinclair Grooms, MD;  Location: [facility] CATH LAB;  Service: Cardiovascular;  Laterality: N/A;   LEFT HEART CATHETERIZATION WITH CORONARY/GRAFT ANGIOGRAM N/A 06/01/2013   Procedure: LEFT HEART CATHETERIZATION WITH Beatrix Fetters;  Surgeon: Sinclair Grooms, MD;  Location: [facility] CATH LAB;  Service: Cardiovascular;  Laterality: N/A;   MAZE N/A 10/22/2012   Procedure: MAZE;  Surgeon: Rexene Alberts, MD;  Location: [place];  Service: Open Heart Surgery;  Laterality: N/A;   PERCUTANEOUS CORONARY STENT INTERVENTION (PCI-S)  06/01/2013   Procedure: PERCUTANEOUS CORONARY STENT INTERVENTION (PCI-S);  Surgeon: Sinclair Grooms, MD;  Location: [facility] CATH LAB;  Service: Cardiovascular;;   RIGHT/LEFT HEART CATH AND CORONARY/GRAFT ANGIOGRAPHY N/A 05/01/2022   Procedure: RIGHT/LEFT HEART CATH AND CORONARY/GRAFT ANGIOGRAPHY;  Surgeon: Belva Crome, MD;  Location: [place] CV LAB;  Service: Cardiovascular;  Laterality: N/A;   TONSILLECTOMY  1949   TOTAL ABDOMINAL HYSTERECTOMY W/ BILATERAL SALPINGOOPHORECTOMY  1990   benign growth    Current Medications: Current Meds  Medication Sig   atorvastatin (LIPITOR) 10 MG tablet Take 1 tablet (10 mg total) by mouth daily.   carvedilol (COREG) 6.25 MG tablet Take 1 tablet (6.25 mg total) by mouth 2 (two) times daily.   cholecalciferol (VITAMIN D) 1000 UNITS tablet Take 1,000 Units by mouth daily.   clopidogrel (PLAVIX) 75 MG tablet Take 1 tablet (75 mg total) by mouth daily.   Coenzyme Q10 (COQ10 PO) Take 1 capsule by mouth daily.   dapagliflozin propanediol (FARXIGA) 10 MG TABS tablet Take 1 tablet (10 mg total) by mouth daily before breakfast.   diphenhydramine-acetaminophen (TYLENOL PM) 25-500  MG TABS tablet Take 1.5 tablets by mouth at bedtime as needed (sleep).   furosemide (LASIX) 20 MG tablet Take 1 tablet (20 mg total) by mouth 3 (three) times a week. Take on Mondays, Wednesdays, and Fridays.   MAGNESIUM PO Take 1 tablet by mouth in the morning and at bedtime.   Multiple Vitamin (MULTIVITAMIN) tablet Take 1 tablet by mouth daily.   nitroGLYCERIN (NITROSTAT) 0.4 MG SL tablet PLACE AND DISSOLVE 1 TABLET UNDER THE TONGUE EVERY 5 MINUTE AS NEEDED FOR CHEST PAIN   polyethylene glycol (MIRALAX / GLYCOLAX) 17 g packet Take 17 g by mouth daily.   Potassium 99 MG TABS Take 3 tablets by mouth 2 (two) times daily.   sacubitril-valsartan (ENTRESTO) 49-51 MG Take 1 tablet by mouth 2 (two) times daily.   spironolactone (ALDACTONE) 25 MG tablet Take 0.5 tablets (12.5 mg total) by mouth daily.   Current Facility-Administered Medications for the 07/30/22 encounter (Office Visit) with Freada Bergeron, MD  Medication   sodium chloride flush (NS) 0.9 % injection 3 mL     Allergies:  Contrast media [iodinated contrast media], Pravastatin, Simvastatin, Tramadol hcl, Zetia [ezetimibe], Oxycodone, and Repatha [evolocumab]   Social History   Socioeconomic History   Marital status: Widowed    Spouse name: Not on file   Number of children: 2   Years of education: 72   Highest education level: Associate degree: occupational, Hotel manager, or vocational program  Occupational History   Occupation: retired    Comment: LPN  Tobacco Use   Smoking status: Never   Smokeless tobacco: Never  Vaping Use   Vaping Use: Never used  Substance and Sexual Activity   Alcohol use: Yes    Alcohol/week: 2.0 standard drinks of alcohol    Types: 2 Glasses of wine per week    Comment: 2/month   Drug use: No   Sexual activity: Not Currently  Other Topics Concern   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  1 son in [place], 1 son in [place]. 7 grandchildren, 2 great granddaughters,  1 great grandson (in [place]), planning to adopt a child from [place] in spring 2024.      Updated 04/2022   Social Determinants of Health   Financial Resource Strain: Low Risk  (05/26/2019)   Overall Financial Resource Strain (CARDIA)    Difficulty of Paying Living Expenses: Not hard at all  Food Insecurity: No Food Insecurity (05/26/2019)   Hunger Vital Sign    Worried About Running Out of Food in the Last Year: Never true    Ran Out of Food in the Last Year: Never true  Transportation Needs: No Transportation Needs (05/26/2019)   PRAPARE - Hydrologist (Medical): No    Lack of Transportation (Non-Medical): No  Physical Activity: Insufficiently Active (05/26/2019)   Exercise Vital Sign    Days of Exercise per Week: 5 days    Minutes of Exercise per Session: 20 min  Stress: No Stress Concern Present (05/26/2019)   [place]    Feeling of Stress : Not at all  Social Connections: Unknown (05/26/2019)   Social Connection and Isolation Panel [NHANES]    Frequency of Communication with Friends and Family: Not asked    Frequency of Social Gatherings with Friends and Family: Not asked    Attends Religious Services: Not on file    Active Member of Clubs or Organizations: Not on file    Attends Archivist Meetings: Not asked    Marital Status: Widowed     Family History: The patient's family history includes Cancer in her brother; Heart attack (age of onset: 42) in her mother; Heart disease in an other family member; Heart disease (age of onset: 43) in her brother; Hypertension in her brother and mother; Prostate cancer in her brother. There is no history of Diabetes, Breast cancer, Colon cancer, Stomach cancer, Rectal cancer, Liver cancer, Pancreatic cancer, or Esophageal cancer.  ROS:   Please see the history of present illness.     All other systems reviewed and are  negative.  EKGs/Labs/Other Studies Reviewed:    The following studies were reviewed today: RHC/LHC 04/2022: CONCLUSIONS: Acute on chronic systolic and diastolic heart failure with EF less than 25%, LVEDP 29 mmHg, and cardiac output 4.3 L/min.  Regional wall motion abnormality identified. Severe pulmonary hypertension with PA mean 47 mmHg, pulmonary vascular resistance 3.49 Wood units, and pulmonary capillary wedge pressure mean 32 mmHg.  V wave to 43 mmHg.  WHO group 2 etiology. Coronary anatomy is unchanged from 2020.  The stented graft to the circumflex is widely patent.  The LIMA to the LAD is widely patent.  The native right coronary is widely patent within the previously stented proximal and mid segment.  Moderate disease is noted distally. RECOMMENDATIONS: Again attempt to institute guideline directed therapy for systolic heart failure.  ACE inhibitor, Tenormin, and HCTZ will be discontinued.  Entresto 24/26 mg twice daily, carvedilol 12.5 mg p.o. twice daily, and Wilder Glade will be started.  Kidney function will be assessed in 1 week at which time hopefully an MRA can be added. Hemostasis with minx device. Discussed with the daughter, Altha Harm. Will need follow-up blood work in 1 week and OV with APP or me in 1 to 2 weeks.   TTE 03/2022: IMPRESSIONS     1. LV function is severely depressed with akinesis of the base/mid  septum, base/mid inferior, distal lateral and apical walls; Hypokinesis  elsewhere except for base/mid anteiror wall that appears to thicken more  normally. Compared to previous echo in  2020, LV function is now severely reduced.. Left ventricular ejection  fraction, by estimation, is 20 to 25%. The left ventricle has severely  decreased function. Left ventricular diastolic parameters are consistent  with Grade III diastolic dysfunction  (restrictive). Elevated left atrial pressure.   2. Right ventricular systolic function is moderately reduced. The right   ventricular size is normal.   3. Right atrial size was mildly dilated.   4. Mild mitral valve regurgitation.   5. The aortic valve is tricuspid. Aortic valve regurgitation is trivial.   EKG:  EKG is not ordered today.    Recent Labs: 04/25/2022: Platelets 177 05/01/2022: Hemoglobin 13.3 05/23/2022: ALT 7 06/11/2022: BUN 22; Creatinine, Ser 1.28; Potassium 4.2; Sodium 139  Recent Lipid Panel    Component Value Date/Time   CHOL 139 04/25/2022 1545   TRIG 172 (H) 04/25/2022 1545   HDL 40 04/25/2022 1545   CHOLHDL 3.5 04/25/2022 1545   CHOLHDL 2.9 04/25/2019 0819   VLDL 21 04/25/2019 0819   LDLCALC 70 04/25/2022 1545     Risk Assessment/Calculations:    CHA2DS2-VASc Score = 6   This indicates a 9.7% annual risk of stroke. The patient's score is based upon: CHF History: 1 HTN History: 1 Diabetes History: 0 Stroke History: 0 Vascular Disease History: 1 Age Score: 2 Gender Score: 1            Physical Exam:    VS:  BP 112/62   Pulse 84   Ht '5\' 6"'$  (1.676 m)   Wt 129 lb 3.2 oz (58.6 kg)   SpO2 99%   BMI 20.85 kg/m     Wt Readings from Last 3 Encounters:  07/30/22 129 lb 3.2 oz (58.6 kg)  06/11/22 127 lb 9.6 oz (57.9 kg)  05/29/22 131 lb 3.2 oz (59.5 kg)     GEN:  Well nourished, well developed in no acute distress HEENT: Normal NECK: No JVD; No carotid bruits CARDIAC: RRR, no murmurs, rubs, gallops RESPIRATORY:  Clear to auscultation without rales, wheezing or rhonchi  ABDOMEN: Soft, non-tender, non-distended MUSCULOSKELETAL:  No edema; No deformity  SKIN: Warm and dry NEUROLOGIC:  Alert and oriented x 3 PSYCHIATRIC:  Normal affect   ASSESSMENT:    1. Chronic combined systolic and diastolic heart failure ([place])   2. Essential hypertension   3. Ischemic cardiomyopathy   4. S/P Maze operation for atrial fibrillation   5. Paroxysmal atrial  fibrillation ([place])   6. Left bundle branch block (LBBB)   7. Coronary artery disease of bypass graft of native  heart with stable angina pectoris ([place])   8. Systolic heart failure, unspecified HF chronicity (HCC)    PLAN:    In order of problems listed above:  #Chronic Combined Systolic and Diastolic HF: TTE 59/1638 with LVEF 20-25% with akinesis of the basal to mid septum, basal to mid inferior, distal lateral and apical walls. G3DD, mild MR. Currently, feeling much better with NYHA class II symptoms. Euvolemic on exam. -Continue lasix '20mg'$  M,W, F -Continue coreg 6.'25mg'$  BID -Continue farxiga '10mg'$  daily -Continue entresto 49-'51mg'$  BID -Continue spironolactone 12.'5mg'$  daily -Plan for repeat ultrasound in 08/2022 for reassessment of EF on GDMT -May need CRT-D pending EF   #Known CAD s/p CABG with subsequent PCI: [facility] 04/2022 with patent LIMA to LAD, patent RCA stents, patent Lcx stent. LVEF severely depressed 20-25% as detailed above. Currently, without anginal symptoms.  -Continue lipitor '10mg'$  daily -Continue plavix '75mg'$  daily  #Pulmonary HTN: PASP 47. Group 2. Will manage HF as above.  #HTN: Blood pressure well controlled and at goal <130/80. -Continue entresto 49-'51mg'$  BID -Continue spironolactone 12.'5mg'$  daily -Continue coreg 6.'25mg'$  BID  #HLD: -Continue lipitor '10mg'$  daily -LDL 70 in 04/2022  #Paroxysmal Afib: S/p MAZE with no known recurrence. Not on AC.            Medication Adjustments/Labs and Tests Ordered: Current medicines are reviewed at length with the patient today.  Concerns regarding medicines are outlined above.  Orders Placed This Encounter  Procedures   AMB referral to CHF clinic   ECHOCARDIOGRAM COMPLETE   No orders of the defined types were placed in this encounter.   Patient Instructions  Medication Instructions:   Your physician recommends that you continue on your current medications as directed. Please refer to the Current Medication list given to you today.  *If you need a refill on your cardiac medications before your next appointment, please call  your pharmacy*    You have been referred to [place]     Testing/Procedures:  Your physician has requested that you have an echocardiogram. Echocardiography is a painless test that uses sound waves to create images of your heart. It provides your doctor with information about the size and shape of your heart and how well your heart's chambers and valves are working. This procedure takes approximately one hour. There are no restrictions for this procedure. SCHEDULE ECHO TO BE DONE IN MARCH 2024 PER DR. Johney Frame   Please do NOT wear cologne, perfume, aftershave, or lotions (deodorant is allowed). Please arrive 15 minutes prior to your appointment time.     Follow-Up:   3 MONTHS WITH DR. Johney Frame     Signed, Freada Bergeron, MD  07/30/2022 3:53 PM    [place]

## 2022-07-30 ENCOUNTER — Ambulatory Visit: Payer: Medicare Other | Attending: Cardiology | Admitting: Cardiology

## 2022-07-30 ENCOUNTER — Encounter: Payer: Self-pay | Admitting: Cardiology

## 2022-07-30 VITALS — BP 112/62 | HR 84 | Ht 66.0 in | Wt 129.2 lb

## 2022-07-30 DIAGNOSIS — I10 Essential (primary) hypertension: Secondary | ICD-10-CM | POA: Insufficient documentation

## 2022-07-30 DIAGNOSIS — I480 Paroxysmal atrial fibrillation: Secondary | ICD-10-CM

## 2022-07-30 DIAGNOSIS — I5020 Unspecified systolic (congestive) heart failure: Secondary | ICD-10-CM

## 2022-07-30 DIAGNOSIS — I447 Left bundle-branch block, unspecified: Secondary | ICD-10-CM

## 2022-07-30 DIAGNOSIS — I255 Ischemic cardiomyopathy: Secondary | ICD-10-CM | POA: Diagnosis not present

## 2022-07-30 DIAGNOSIS — Z8679 Personal history of other diseases of the circulatory system: Secondary | ICD-10-CM | POA: Insufficient documentation

## 2022-07-30 DIAGNOSIS — I25708 Atherosclerosis of coronary artery bypass graft(s), unspecified, with other forms of angina pectoris: Secondary | ICD-10-CM | POA: Insufficient documentation

## 2022-07-30 DIAGNOSIS — Z9889 Other specified postprocedural states: Secondary | ICD-10-CM

## 2022-07-30 DIAGNOSIS — I5042 Chronic combined systolic (congestive) and diastolic (congestive) heart failure: Secondary | ICD-10-CM | POA: Diagnosis not present

## 2022-07-30 NOTE — Patient Instructions (Signed)
Medication Instructions:   Your physician recommends that you continue on your current medications as directed. Please refer to the Current Medication list given to you today.  *If you need a refill on your cardiac medications before your next appointment, please call your pharmacy*    You have been referred to [place]     Testing/Procedures:  Your physician has requested that you have an echocardiogram. Echocardiography is a painless test that uses sound waves to create images of your heart. It provides your doctor with information about the size and shape of your heart and how well your heart's chambers and valves are working. This procedure takes approximately one hour. There are no restrictions for this procedure. SCHEDULE ECHO TO BE DONE IN MARCH 2024 PER DR. Johney Frame   Please do NOT wear cologne, perfume, aftershave, or lotions (deodorant is allowed). Please arrive 15 minutes prior to your appointment time.     Follow-Up:   3 MONTHS WITH DR. Johney Frame

## 2022-08-21 ENCOUNTER — Ambulatory Visit (HOSPITAL_COMMUNITY): Payer: Medicare Other | Attending: Internal Medicine

## 2022-08-21 DIAGNOSIS — I5020 Unspecified systolic (congestive) heart failure: Secondary | ICD-10-CM

## 2022-08-21 DIAGNOSIS — I255 Ischemic cardiomyopathy: Secondary | ICD-10-CM | POA: Diagnosis not present

## 2022-08-21 DIAGNOSIS — I5042 Chronic combined systolic (congestive) and diastolic (congestive) heart failure: Secondary | ICD-10-CM | POA: Diagnosis not present

## 2022-08-21 LAB — ECHOCARDIOGRAM COMPLETE
Area-P 1/2: 3.5 cm2
P 1/2 time: 422 msec
S' Lateral: 4.2 cm

## 2022-08-22 ENCOUNTER — Telehealth: Payer: Self-pay | Admitting: *Deleted

## 2022-08-22 DIAGNOSIS — I5043 Acute on chronic combined systolic (congestive) and diastolic (congestive) heart failure: Secondary | ICD-10-CM

## 2022-08-22 DIAGNOSIS — R9430 Abnormal result of cardiovascular function study, unspecified: Secondary | ICD-10-CM

## 2022-08-22 DIAGNOSIS — I255 Ischemic cardiomyopathy: Secondary | ICD-10-CM

## 2022-08-22 NOTE — Telephone Encounter (Signed)
-----   Message from Freada Bergeron, MD sent at 08/22/2022  5:08 PM EST ----- Her echo shows her EF is persistently low at 20-25%. There is mild leakiness of the mitral valve. This is stable from prior. Given that her EF remains low, can we refer her to EP for further evaluation for ICD?

## 2022-08-22 NOTE — Telephone Encounter (Signed)
The patient has been notified of the result and verbalized understanding.  All questions (if any) were answered.  Pt aware that we will refer her to EP for evaluation and consideration of ICD, for noted persistently low EF.   Pt aware that I will place the referral in the system and send a message to our EP Scheduler to call her back tomorrow to arrange this appt.   Pt verbalized understanding and agrees with this plan.

## 2022-08-28 ENCOUNTER — Encounter: Payer: Self-pay | Admitting: Cardiology

## 2022-09-10 ENCOUNTER — Encounter: Payer: Self-pay | Admitting: Cardiovascular Disease

## 2022-09-10 ENCOUNTER — Ambulatory Visit: Payer: Medicare Other | Attending: Cardiovascular Disease | Admitting: Cardiovascular Disease

## 2022-09-10 VITALS — BP 122/64 | HR 78 | Ht 66.0 in | Wt 129.6 lb

## 2022-09-10 DIAGNOSIS — R9430 Abnormal result of cardiovascular function study, unspecified: Secondary | ICD-10-CM | POA: Diagnosis not present

## 2022-09-10 DIAGNOSIS — I255 Ischemic cardiomyopathy: Secondary | ICD-10-CM | POA: Diagnosis not present

## 2022-09-10 NOTE — Patient Instructions (Addendum)
Medication Instructions:  Your physician recommends that you continue on your current medications as directed. Please refer to the Current Medication list given to you today.  *If you need a refill on your cardiac medications before your next appointment, please call your pharmacy*  Lab Work: None ordered  Testing/Procedures: Your physician has recommended that you have a defibrillator inserted. An implantable cardioverter defibrillator (ICD) is a small device that is placed in your chest or, in rare cases, your abdomen. This device uses electrical pulses or shocks to help control life-threatening, irregular heartbeats that could lead the heart to suddenly stop beating (sudden cardiac arrest). Leads are attached to the ICD that goes into your heart. This is done in the hospital and usually requires an overnight stay. Please see the instruction sheet given to you today for more information.   Please call our office at [phone] and ask to speak with April Garrison, she is our EP procedure scheduler and can assist you in getting setup for this procedure if you decide to move forward with this procedure.

## 2022-09-10 NOTE — Progress Notes (Signed)
Electrophysiology Office Note:    Date:  09/10/2022   ID:  Claudia Howell, DOB 1940/12/31, MRN [id number]  PCP:  Rita Ohara, MD   [place] Prov[facility] Cardiologist:  Sinclair Grooms, MD (Inactive)     Referring MD: Freada Bergeron, MD   History of Present Illness:    Claudia Howell is a 82 y.o. female with a hx listed below, significant for CAD s/p CABG x3 in 2014 with MAZE, PCI to SVG in 2014, NSTEMI in 04/2019 treated with PCI to RCA, HTN, HLD, pAFib, and chronic combined systolic and diastolic HF , referred for arrhythmia management.  She has a history of myocardial infarction and underwent coronary bypass in 2014.  Maze procedure and left atrial appendage clip was performed at this time.  Echocardiogram in October 2023 showed severely reduced ejection fraction, 20 to 25%.  She was placed on GDMT including Entresto and carvedilol, for which she was taking for 3 months prior to repeat echocardiogram performed in February 2024.  The repeat echo showed persistently decreased EF at 20 to 25%.  The patient reports that she has been feeling much better while taking the medication.  She does have some shortness of breath and fatigue.   Past Medical History:  Diagnosis Date   Adenomatous colon polyp 03/2008   CHF (congestive heart failure) (HCC)    Constipation    Coronary artery disease    Last cath 12/14 Cath 12/8 100% SVG occlusion to RCA, 95% SVG.  The stenosis to OM.  LIMA to LAD patent but diffuse disease in LAD after graft.  Overlapping stents mid/distal SVG to OM.   Decubitus ulcer of coccyx 10/22/2012   1/2 inch raw open area on coccyx   Hyperlipidemia    Hypertension    Dr.Henry Tamala Julian   Myocardial infarction [facility])    Osteopenia 02/2008   PAF (paroxysmal atrial fibrillation) ([place])    during cath/notes 10/16/2012   Right posterior capsular opacification 11/16/2019   S/P CABG x 3 10/22/2012   LIMA to LAD, SVG to distal RCA, SVG to OM, EVH from right thigh    S/P Maze operation for atrial fibrillation 10/22/2012   Left side lesion set using bipolar radiofrequency and cryothermy ablation with clipping of LA appendage   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    Past Surgical History:  Procedure Laterality Date   BREAST CYST EXCISION Left 1980-1990's   x 3   CARDIAC CATHETERIZATION  10/16/2012   CATARACT EXTRACTION, BILATERAL Bilateral    03/2015 and 05/2015   CATARACT EXTRACTION, BILATERAL Bilateral approx 2016   CORONARY ANGIOPLASTY WITH STENT PLACEMENT  04/01/2013   "1" (06/01/2013)   CORONARY ARTERY BYPASS GRAFT N/A 10/22/2012   Procedure: CORONARY ARTERY BYPASS GRAFTING (CABG);  Surgeon: Rexene Alberts, MD;  Location: [place];  Service: Open Heart Surgery;  Laterality: N/A;   CORONARY ATHERECTOMY N/A 04/29/2019   Procedure: CORONARY ATHERECTOMY;  Surgeon: Martinique, Peter M, MD;  Location: [place] CV LAB;  Service: Cardiovascular;  Laterality: N/A;   CORONARY CTO INTERVENTION N/A 04/29/2019   Procedure: CORONARY CTO INTERVENTION;  Surgeon: Martinique, Peter M, MD;  Location: [place] [facility];  Service: Cardiovascular;  Laterality: N/A;   CORONARY STENT INTERVENTION N/A 01/01/2019   Procedure: CORONARY STENT INTERVENTION;  Surgeon: Belva Crome, MD;  Location: [place] [facility];  Service: Cardiovascular;  Laterality: N/A;   DILATION AND CURETTAGE OF UTERUS     INTRAOPERATIVE TRANSESOPHAGEAL ECHOCARDIOGRAM N/A 10/22/2012  Procedure: INTRAOPERATIVE TRANSESOPHAGEAL ECHOCARDIOGRAM;  Surgeon: Rexene Alberts, MD;  Location: [place];  Service: Open Heart Surgery;  Laterality: N/A;   LEFT HEART CATH AND CORS/GRAFTS ANGIOGRAPHY N/A 01/01/2019   Procedure: LEFT HEART CATH AND CORS/GRAFTS ANGIOGRAPHY;  Surgeon: Belva Crome, MD;  Location: [place] [facility];  Service: Cardiovascular;  Laterality: N/A;   LEFT HEART CATH AND CORS/GRAFTS ANGIOGRAPHY N/A 04/28/2019   Procedure: LEFT HEART CATH AND CORS/GRAFTS ANGIOGRAPHY;  Surgeon: Belva Crome, MD;   Location: [place] [facility];  Service: Cardiovascular;  Laterality: N/A;   LEFT HEART CATHETERIZATION WITH CORONARY ANGIOGRAM N/A 10/16/2012   Procedure: LEFT HEART CATHETERIZATION WITH CORONARY ANGIOGRAM;  Surgeon: Sinclair Grooms, MD;  Location[facility] C[facility] LAB;  Service: Cardiovascular;  Laterality: N/A;   LEFT HEART CATHETERIZATION WITH CORONARY/GRAFT ANGIOGRAM N/A 06/01/2013   Procedure: LEFT HEART CATHETERIZATION WITH Beatrix Fetters;  Surgeon: Sinclair Grooms, MD;  Location: [facility] CATH LAB;  Service: Cardiovascular;  Laterality: N/A;   MAZE N/A 10/22/2012   Procedure: MAZE;  Surgeon: Rexene Alberts, MD;  Location: [place];  Service: Open Heart Surgery;  Laterality: N/A;   PERCUTANEOUS CORONARY STENT INTERVENTION (PCI-S)  06/01/2013   Procedure: PERCUTANEOUS CORONARY STENT INTERVENTION (PCI-S);  Surgeon: Sinclair Grooms, MD;  Location: [facility] CATH LAB;  Service: Cardiovascular;;   RIGHT/LEFT HEART CATH AND CORONARY/GRAFT ANGIOGRAPHY N/A 05/01/2022   Procedure: RIGHT/LEFT HEART CATH AND CORONARY/GRAFT ANGIOGRAPHY;  Surgeon: Belva Crome, MD;  Location: [place] CV LAB;  Service: Cardiovascular;  Laterality: N/A;   TONSILLECTOMY  1949   TOTAL ABDOMINAL HYSTERECTOMY W/ BILATERAL SALPINGOOPHORECTOMY  1990   benign growth    Current Medications: Current Meds  Medication Sig   atorvastatin (LIPITOR) 10 MG tablet Take 1 tablet (10 mg total) by mouth daily.   carvedilol (COREG) 6.25 MG tablet Take 1 tablet (6.25 mg total) by mouth 2 (two) times daily.   cholecalciferol (VITAMIN D) 1000 UNITS tablet Take 1,000 Units by mouth daily.   clopidogrel (PLAVIX) 75 MG tablet Take 1 tablet (75 mg total) by mouth daily.   Coenzyme Q10 (COQ10 PO) Take 1 capsule by mouth daily.   dapagliflozin propanediol (FARXIGA) 10 MG TABS tablet Take 1 tablet (10 mg total) by mouth daily before breakfast.   diphenhydramine-acetaminophen (TYLENOL PM) 25-500 MG TABS tablet Take 1.5 tablets by mouth at bedtime as  needed (sleep).   furosemide (LASIX) 20 MG tablet Take 1 tablet (20 mg total) by mouth 3 (three) times a week. Take on Mondays, Wednesdays, and Fridays.   MAGNESIUM PO Take 1 tablet by mouth in the morning and at bedtime.   Multiple Vitamin (MULTIVITAMIN) tablet Take 1 tablet by mouth daily.   nitroGLYCERIN (NITROSTAT) 0.4 MG SL tablet PLACE AND DISSOLVE 1 TABLET UNDER THE TONGUE EVERY 5 MINUTE AS NEEDED FOR CHEST PAIN   polyethylene glycol (MIRALAX / GLYCOLAX) 17 g packet Take 17 g by mouth daily.   Potassium 99 MG TABS Take 3 tablets by mouth 2 (two) times daily.   sacubitril-valsartan (ENTRESTO) 49-51 MG Take 1 tablet by mouth 2 (two) times daily.   spironolactone (ALDACTONE) 25 MG tablet Take 0.5 tablets (12.5 mg total) by mouth daily.   Current Facility-Administered Medications for the 09/10/22 encounter (Office Visit) with Hien Perreira, Yetta Barre, MD  Medication   sodium chloride flush (NS) 0.9 % injection 3 mL     Allergies:   Contrast media [iodinated contrast media], Pravastatin, Simvastatin, Tramadol hcl, Zetia [ezetimibe], Oxycodone,  and Repatha [evolocumab]   Social and Family History: Reviewed in Epic  ROS:   Please see the history of present illness.    All other systems reviewed and are negative.  EKGs/Labs/Other Studies Reviewed Today:    Echocardiogram:  TTE 08/21/22 EF 30-35% global hypokinesis  TTE 04/11/22 EF 20-25%  Monitors:  Advanced imaging:   EKG:  Last EKG results: today - sinus rhythm, non-specific intraventricular conduction delay.   Recent Labs: 04/25/2022: Platelets 177 05/01/2022: Hemoglobin 13.3 05/23/2022: ALT 7 06/11/2022: BUN 22; Creatinine, Ser 1.28; Potassium 4.2; Sodium 139     Physical Exam:    VS:  BP 122/64   Pulse 78   Ht 5\' 6"  (1.676 m)   Wt 129 lb 9.6 oz (58.8 kg)   SpO2 96%   BMI 20.92 kg/m     Wt Readings from Last 3 Encounters:  09/10/22 129 lb 9.6 oz (58.8 kg)  07/30/22 129 lb 3.2 oz (58.6 kg)  06/11/22 127 lb 9.6  oz (57.9 kg)     GEN: Well nourished, well developed in no acute distress CARDIAC: RRR, no murmurs, rubs, gallops RESPIRATORY:  Normal work of breathing MUSCULOSKELETAL: no edema    ASSESSMENT & PLAN:    Chronic congestive heart failure, NYHA II -Continue carvedilol 6.25 mg, dapagliflozin 10 mg, sacubitril valsartan 49-51, spironolactone 12.5 mg  Ischemic cardiomyopathy -EF has remained at 20 to 25% despite GDMT for greater than 3 months.  I recommended placement of an ICD for primary prevention of sudden cardiac death. -I discussed the indication for the procedure and the logistics, risks, potential benefit, and after care. I specifically explained that risks include but are not limited to infection, bleeding,damage to blood vessels, lung, and the heart -- but risk of prolonged hospitalization, need for surgery, or the event of stroke, heart attack, or death are low but not zero.  -She is hesitant to schedule the procedure today.  I did reiterate to her that she is at unprotected risk of cardiac death due to arrhythmia without the device.  Additionally, the CRT function could improve her fatigue, improve her heart function, reduce her risk of hospitalization for CHF or risk of death.   Nonspecific intraventricular conduction delay -QRS is about 160 ms -would place LV lead for CRT.         Medication Adjustments/Labs and Tests Ordered: Current medicines are reviewed at length with the patient today.  Concerns regarding medicines are outlined above.  No orders of the defined types were placed in this encounter.  No orders of the defined types were placed in this encounter.    Signed, Melida Quitter, MD  09/10/2022 11:59 AM    [place]

## 2022-09-20 ENCOUNTER — Encounter: Payer: Self-pay | Admitting: Cardiovascular Disease

## 2022-09-20 ENCOUNTER — Telehealth: Payer: Self-pay | Admitting: Cardiovascular Disease

## 2022-09-20 DIAGNOSIS — Z01812 Encounter for preprocedural laboratory examination: Secondary | ICD-10-CM

## 2022-09-20 DIAGNOSIS — I255 Ischemic cardiomyopathy: Secondary | ICD-10-CM

## 2022-09-20 NOTE — Telephone Encounter (Signed)
Returned call to patient. She states she is ready to schedule her ICD implant procedure.  *Added to Dr. Karel Jarvis EP calendar for 10/22/22 at 11:30am. *Scheduled with cath lab. *Labs ordered and lab appt scheduled for 10/18/22. *Instructions sent to patient via MyChart.  Patient to meet with EP procedure scheduler April G on 10/18/22 to pick up scrub and review instructions.

## 2022-09-20 NOTE — Telephone Encounter (Signed)
Pt called in stating she would like to set up ICD procedure discussed at last appt

## 2022-10-08 ENCOUNTER — Encounter (HOSPITAL_COMMUNITY): Payer: Self-pay | Admitting: Internal Medicine

## 2022-10-08 ENCOUNTER — Ambulatory Visit (HOSPITAL_COMMUNITY)
Admission: RE | Admit: 2022-10-08 | Discharge: 2022-10-08 | Disposition: A | Discharge status: Home / Self Care | Payer: Medicare Other | Source: Ambulatory Visit | Attending: Internal Medicine | Admitting: Internal Medicine

## 2022-10-08 VITALS — BP 98/50 | HR 77 | Wt 132.8 lb

## 2022-10-08 DIAGNOSIS — Z7984 Long term (current) use of oral hypoglycemic drugs: Secondary | ICD-10-CM | POA: Diagnosis not present

## 2022-10-08 DIAGNOSIS — Z79899 Other long term (current) drug therapy: Secondary | ICD-10-CM | POA: Diagnosis not present

## 2022-10-08 DIAGNOSIS — I480 Paroxysmal atrial fibrillation: Secondary | ICD-10-CM | POA: Insufficient documentation

## 2022-10-08 DIAGNOSIS — E785 Hyperlipidemia, unspecified: Secondary | ICD-10-CM | POA: Diagnosis not present

## 2022-10-08 DIAGNOSIS — R0602 Shortness of breath: Secondary | ICD-10-CM | POA: Insufficient documentation

## 2022-10-08 DIAGNOSIS — I447 Left bundle-branch block, unspecified: Secondary | ICD-10-CM | POA: Diagnosis not present

## 2022-10-08 DIAGNOSIS — I5042 Chronic combined systolic (congestive) and diastolic (congestive) heart failure: Secondary | ICD-10-CM | POA: Insufficient documentation

## 2022-10-08 DIAGNOSIS — I2510 Atherosclerotic heart disease of native coronary artery without angina pectoris: Secondary | ICD-10-CM | POA: Insufficient documentation

## 2022-10-08 DIAGNOSIS — I272 Pulmonary hypertension, unspecified: Secondary | ICD-10-CM | POA: Insufficient documentation

## 2022-10-08 DIAGNOSIS — R5383 Other fatigue: Secondary | ICD-10-CM | POA: Insufficient documentation

## 2022-10-08 DIAGNOSIS — Z955 Presence of coronary angioplasty implant and graft: Secondary | ICD-10-CM | POA: Insufficient documentation

## 2022-10-08 DIAGNOSIS — I5022 Chronic systolic (congestive) heart failure: Secondary | ICD-10-CM

## 2022-10-08 DIAGNOSIS — Z951 Presence of aortocoronary bypass graft: Secondary | ICD-10-CM | POA: Insufficient documentation

## 2022-10-08 DIAGNOSIS — Z7902 Long term (current) use of antithrombotics/antiplatelets: Secondary | ICD-10-CM | POA: Diagnosis not present

## 2022-10-08 DIAGNOSIS — I252 Old myocardial infarction: Secondary | ICD-10-CM | POA: Diagnosis not present

## 2022-10-08 DIAGNOSIS — I110 Hypertensive heart disease with heart failure: Secondary | ICD-10-CM | POA: Diagnosis not present

## 2022-10-08 MED ORDER — DIGOXIN 125 MCG PO TABS: 0.0625 mg | ORAL_TABLET | Freq: Every day | ORAL | 3 refills | Status: DC

## 2022-10-08 MED ORDER — ENTRESTO 24-26 MG PO TABS: 1.0000 | ORAL_TABLET | Freq: Two times a day (BID) | ORAL | 11 refills | Status: DC

## 2022-10-08 MED ORDER — CARVEDILOL 3.125 MG PO TABS: 6.2500 mg | ORAL_TABLET | Freq: Two times a day (BID) | ORAL | 3 refills | Status: DC

## 2022-10-08 NOTE — Progress Notes (Signed)
ADVANCED HF CLINIC CONSULT NOTE  Referring Physician: Joselyn Arrow, MD Primary Care: Joselyn Arrow, MD Primary Cardiologist: Dr. 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 Shari Prows for further evalaution of her HF.    Last cath 04/2022 with patent LIMA to LAD, patent RCA with patent RCA stents, patent Lcx stent. Stable anatomy since 2020. RHC with EF <25%, severe pulmonary HTN with PASP , PVR 3.49 WU, PCWP . TTE 03/2022 with EF 20-25%, G3DD, akinesis of the basal-mid inferior, distal lateral apical walls, hypokinesis elsewhere, G3DD, normal RV, mild MR.  She was previously followed by Dr. Katrinka Blazing and recently has seen Dr. Shari Prows.    Says she struggles to do her ADLs. "I have to figure out how to spend my energy during the day." Pending CRT later this month with Dr. Nelly Laurence. .Mostly fatigue but also some SOB. Takes lasix every other day. No edema, orthopnea or PND. BP runs low the day after lasix. If too low will eat tomato and salt.    Review of Systems: [y] = yes,  = no   General: Weight gain ; Weight loss ; Anorexia ; Fatigue [ y]; Fever ; Chills ; Weakness   Cardiac: Chest pain/pressure ; Resting SOB ; Exertional SOB ; Orthopnea ; Pedal Edema ; Palpitations ; Syncope ; Presyncope ; Paroxysmal nocturnal dyspnea[ ]   Pulmonary: Cough ; Wheezing[ ] ; Hemoptysis[ ] ; Sputum ; Snoring   GI: Vomiting[ ] ; Dysphagia[ ] ; Melena[ ] ; Hematochezia ; Heartburn[ ] ; Abdominal pain ; Constipation ; Diarrhea ; BRBPR   GU: Hematuria[ ] ; Dysuria ; Nocturia[ ]   Vascular: Pain in legs with walking ; Pain in feet with lying flat ; Non-healing sores ; Stroke ; TIA ; Slurred speech ;   Neuro: Headaches[ ] ; Vertigo[ ] ; Seizures[ ] ; Paresthesias[ ] ;Blurred vision ; Diplopia ; Vision changes   Ortho/Skin: Arthritis [place] ]; Joint pain Cove.Etienne ]; Muscle pain ; Joint swelling ; Back Pain ; Rash   Psych: Depression[ ] ; Anxiety[ ]   Heme: Bleeding problems ; Clotting disorders ; Anemia   Endocrine: Diabetes ; Thyroid dysfunction[ ]    Past Medical History:  Diagnosis Date   Adenomatous colon polyp 03/2008   CHF (congestive heart failure)    Constipation    Coronary artery disease    Last cath 12/14 Cath 12/8 100% SVG occlusion to RCA, 95% SVG.  The stenosis to OM.  LIMA to LAD patent but diffuse disease in LAD after graft.  Overlapping stents mid/distal SVG to OM.   Decubitus ulcer of coccyx 10/22/2012   1/2 inch raw open area on coccyx   Hyperlipidemia    Hypertension    Dr.Henry Katrinka Blazing   Myocardial infarction    Osteopenia 02/2008   PAF (paroxysmal atrial fibrillation)    during cath/notes 10/16/2012   Right posterior capsular opacification 11/16/2019   S/P CABG x 3 10/22/2012   LIMA to LAD, SVG to distal RCA, SVG to OM, EVH from right thigh   S/P Maze operation  for atrial fibrillation 10/22/2012   Left side lesion set using bipolar radiofrequency and cryothermy ablation with clipping of LA appendage   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 90 tablet 3   carvedilol (COREG) 6.25 MG tablet Take 1 tablet (6.25 mg total) by mouth 2 (two) times daily. 180 tablet 3   cholecalciferol (VITAMIN D) 1000 UNITS tablet Take 1,000 Units by mouth daily.     clopidogrel (PLAVIX) 75 MG tablet Take 1 tablet (75 mg total) by mouth daily. 90 tablet 3   Coenzyme Q10 (COQ10 PO) Take 1 capsule by mouth daily.     dapagliflozin propanediol (FARXIGA) 10 MG TABS tablet Take 1 tablet (10 mg total) by mouth daily  before breakfast. 90 tablet 3   diphenhydramine-acetaminophen (TYLENOL PM) 25-500 MG TABS tablet Take 1.5 tablets by mouth at bedtime as needed (sleep).     furosemide (LASIX) 20 MG tablet Take 1 tablet (20 mg total) by mouth 3 (three) times a week. Take on Mondays, Wednesdays, and Fridays.     MAGNESIUM PO Take 1 tablet by mouth in the morning and at bedtime.     Multiple Vitamin (MULTIVITAMIN) tablet Take 1 tablet by mouth daily.     nitroGLYCERIN (NITROSTAT) 0.4 MG SL tablet PLACE AND DISSOLVE 1 TABLET UNDER THE TONGUE EVERY 5 MINUTE AS NEEDED FOR CHEST PAIN 25 tablet 7   polyethylene glycol (MIRALAX / GLYCOLAX) 17 g packet Take 17 g by mouth daily.     Potassium 99 MG TABS Take 3 tablets by mouth 2 (two) times daily.     sacubitril-valsartan (ENTRESTO) 49-51 MG Take 1 tablet by mouth 2 (two) times daily. 60 tablet 11   spironolactone (ALDACTONE) 25 MG tablet Take 0.5 tablets (12.5 mg total) by mouth daily. 45 tablet 3   Current Facility-Administered Medications  Medication Dose Route Frequency Provider Last Rate Last Admin   sodium chloride flush (NS) 0.9 % injection 3 mL  3 mL Intravenous Q12H Gaston Islam., NP        Allergies  Allergen Reactions   Contrast Media [Iodinated Contrast Media] Hives   Pravastatin     muscle aches   Simvastatin     Muscle cramps   Tramadol Hcl Nausea And Vomiting   Zetia [Ezetimibe] Other (See Comments)    Muscle cramps   Oxycodone Nausea And Vomiting and Other (See Comments)    Extreme nausea and vomiting   Repatha [Evolocumab] Other (See Comments)    Muscle cramps      Social History   Socioeconomic History   Marital status: Widowed    Spouse name: Not on file   Number of children: 2   Years of education: 48   Highest education level: Associate degree: occupational, Scientist, product/process development, or vocational program  Occupational History   Occupation: retired    Comment: LPN  Tobacco Use   Smoking status: Never   Smokeless tobacco: Never  Vaping  Use   Vaping Use: Never used  Substance and Sexual Activity   Alcohol use: Yes    Alcohol/week: 2.0 standard drinks of alcohol    Types: 2 Glasses of wine per week    Comment: 2/month   Drug use: No   Sexual activity: Not Currently  Other Topics Concern   Not on file  Social History Narrative   Widowed.  Lives  alone.  Retired Public house manager.  1 son in [place], 1 son in [place]. 7 grandchildren, 2 great granddaughters, 1 great grandson (in [place]), planning to adopt a child from [place] in spring 2024.      Updated 04/2022   Social Determinants of Health   Financial Resource Strain: Low Risk  (05/26/2019)   Overall Financial Resource Strain (CARDIA)    Difficulty of Paying Living Expenses: Not hard at all  Food Insecurity: No Food Insecurity (05/26/2019)   Hunger Vital Sign    Worried About Running Out of Food in the Last Year: Never true    Ran Out of Food in the Last Year: Never true  Transportation Needs: No Transportation Needs (05/26/2019)   PRAPARE - Administrator, Civil Service (Medical): No    Lack of Transportation (Non-Medical): No  Physical Activity: Insufficiently Active (05/26/2019)   Exercise Vital Sign    Days of Exercise per Week: 5 days    Minutes of Exercise per Session: 20 min  Stress: No Stress Concern Present (05/26/2019)   [employer] of Occupational Health - Occupational Stress Questionnaire    Feeling of Stress : Not at all  Social Connections: Unknown (05/26/2019)   Social Connection and Isolation Panel [NHANES]    Frequency of Communication with Friends and Family: Not asked    Frequency of Social Gatherings with Friends and Family: Not asked    Attends Religious Services: Not on file    Active Member of Clubs or Organizations: Not on file    Attends Banker Meetings: Not asked    Marital Status: Widowed  Catering manager Violence: Not on file      Family History  Problem Relation Age of Onset   Hypertension Mother    Heart attack Mother  32   Hypertension Brother    Cancer Brother        prostate (76), gall bladder (dx 9)   Heart disease Brother 52       stent   Prostate cancer Brother    Heart disease Other        x2 (age 38 and 65)   Diabetes Neg Hx    Breast cancer Neg Hx    Colon cancer Neg Hx    Stomach cancer Neg Hx    Rectal cancer Neg Hx    Liver cancer Neg Hx    Pancreatic cancer Neg Hx    Esophageal cancer Neg Hx     Vitals:   10/08/22 1107  BP: (!) 98/50  Pulse: 77  SpO2: 98%  Weight: 60.2 kg (132 lb 12.8 oz)    PHYSICAL EXAM: General:  Well appearing. No respiratory difficulty HEENT: normal Neck: supple. no JVD. Carotids 2+ bilat; no bruits. No lymphadenopathy or thryomegaly appreciated. Cor: PMI nondisplaced. Regular rate & rhythm. No rubs, gallops or murmurs. Lungs: clear Abdomen: soft, nontender, nondistended. No hepatosplenomegaly. No bruits or masses. Good bowel sounds. Extremities: no cyanosis, clubbing, rash, edema Neuro: alert & oriented x 3, cranial nerves grossly intact. moves all 4 extremities w/o difficulty. Affect pleasant.  ECG: NSR atypical LBBB Personally reviewed    ASSESSMENT & PLAN:  1. Chronic Combined Systolic and Diastolic HF: - due to iCM - Echo 10/23 wEF 20-25% with akinesis of the basal to mid septum, basal to mid inferior, distal lateral and apical walls. G3DD, mild MR - Myoview 1/23 EF 23%  - Echo 2/24 Ef 20-25% - NYHA IIIB - Volume status ok - BP low - Continue  lasix 20mg  M,W, F - Decrease carvedilol to 3.125 mg BID - Continue farxiga 10mg  daily - Decrease entresto to 26/26mg  BID - Continue spironolactone 12.5mg  daily - Add digoxin 0.0625mg  daily - She has advanced HF with NYHA IIIB symptoms despite excellent GDMT and well-controlled volume status. She is pending CRT-D later this month and hopefully will get a functional benefit from this though LBBB morphology not typical. We discussed other advanced options such as Barostim and possible VAD but  given age likely not VAD candidate (and she doesn't want). We will continue to follow closely. Med changes as above,    2. CAD s/p CABG with subsequent PCI: - s/p CABG 2014 (Dr. Cornelius Moras)  - LHC 04/2022 with patent LIMA to LAD, patent RCA stents, patent Lcx stent. LVEF severely depressed 20-25% as detailed above. - No s/s angina -Continue lipitor 10mg  daily -Continue plavix 75mg  daily  3.Paroxysmal Afib: - S/p MAZE with no known recurrence. Not on AC.   4. LBBB - pending CRT-D  Total time spent 50 minutes. Over half that time spent discussing above.    Arvilla Meres, MD  11:58 AM

## 2022-10-08 NOTE — Patient Instructions (Signed)
DECREASE Carvedilol to 3.125 mg Twice daily  DECREASE Entresto to 24/26 mg Twice daily  START Digoxin .0625mg  daily.  Your physician has requested that you have an echocardiogram. Echocardiography is a painless test that uses sound waves to create images of your heart. It provides your doctor with information about the size and shape of your heart and how well your heart's chambers and valves are working. This procedure takes approximately one hour. There are no restrictions for this procedure. Please do NOT wear cologne, perfume, aftershave, or lotions (deodorant is allowed). Please arrive 15 minutes prior to your appointment time.  Your physician recommends that you schedule a follow-up appointment in: 3 months with an echocardiogram (July) ** please call the office in Mid May to arrange your follow up appointment. **  If you have any questions or concerns before your next appointment please send [place] a message through [place] or call our office at [phone].    TO LEAVE A MESSAGE FOR THE NURSE SELECT OPTION 2, PLEASE LEAVE A MESSAGE INCLUDING: YOUR NAME DATE OF BIRTH CALL BACK NUMBER REASON FOR CALL**this is important as we prioritize the call backs  YOU WILL RECEIVE A CALL BACK THE SAME DAY AS LONG AS YOU CALL BEFORE 4:00 PM  At the [facility], you and your health needs are our priority. As part of our continuing mission to provide you with exceptional heart care, we have created designated Provider Care Teams. These Care Teams include your primary Cardiologist (physician) and Advanced Practice Providers (APPs- Physician Assistants and Nurse Practitioners) who all work together to provide you with the care you need, when you need it.   You may see any of the following providers on your designated Care Team at your next follow up: Dr Arvilla Meres Dr Marca Ancona Dr. Marcos Eke, NP Robbie Lis, [place] [facility],  [place] Brynda Peon, NP Karle Plumber, PharmD   Please be sure to bring in all your medications bottles to every appointment.    Thank you for choosing [facility]

## 2022-10-09 ENCOUNTER — Ambulatory Visit: Payer: Self-pay

## 2022-10-09 NOTE — Patient Instructions (Signed)
Visit Information  Thank you for taking time to visit with me today. Please don't hesitate to contact me if I can be of assistance to you.   Following are the goals we discussed today:  -Contact your primary care provider as needed  If you are experiencing a Mental Health or Behavioral Health Crisis or need someone to talk to, please call 911  Patient verbalizes understanding of instructions and care plan provided today and agrees to view in MyChart. Active MyChart status and patient understanding of how to access instructions and care plan via MyChart confirmed with patient.     No further follow up required: Please contact the care coordination team as needed  [name], Kenard Gower, CDP Social Worker, Certified Dementia Practitioner [facility] Care Management  Care Coordination [phone]

## 2022-10-09 NOTE — Patient Outreach (Signed)
  Care Coordination   Initial Visit Note   10/09/2022 Name: Claudia Howell MRN: [id number] DOB: 02-Sep-1940  Claudia Howell is a 82 y.o. year old female who sees Claudia Arrow, MD for primary care. I spoke with  Claudia Howell by phone today.  What matters to the patients health and wellness today?  No concerns identified during today's outreach    Goals Addressed             This Visit's Progress    COMPLETED: Care Coordination Activities       Care Coordination Interventions: SDoH screening performed - no acute resource challenges identified at this time Determined the patient does not have concerns with medication costs and/or adherence at this time Education provided on the role of the care coordination team - no follow up desired at this time Encouraged the patient to contact her primary care provider as needed         SDOH assessments and interventions completed:  Yes  SDOH Interventions Today    Flowsheet Row Most Recent Value  SDOH Interventions   Food Insecurity Interventions Intervention Not Indicated  Housing Interventions Intervention Not Indicated  Transportation Interventions Intervention Not Indicated  Utilities Interventions Intervention Not Indicated        Care Coordination Interventions:  Yes, provided   Interventions Today    Flowsheet Row Most Recent Value  Chronic Disease   Chronic disease during today's visit Hypertension (HTN), Atrial Fibrillation (AFib), Congestive Heart Failure (CHF)  General Interventions   General Interventions Discussed/Reviewed General Interventions Discussed, Doctor Visits  Doctor Visits Discussed/Reviewed Doctor Visits Reviewed  Education Interventions   Education Provided Provided Education  Provided Verbal Education On Other  [role of care coordination team]        Follow up plan: No further intervention required.   Encounter Outcome:  Pt. Visit Completed   Claudia Howell, BSW, CDP Social Worker, Certified  Dementia Practitioner [facility] Care Management  Care Coordination [phone]

## 2022-10-16 ENCOUNTER — Telehealth: Payer: Self-pay | Admitting: *Deleted

## 2022-10-16 NOTE — Telephone Encounter (Signed)
Called pt to inform her that her procedure time for next week has changed. Aware she will need to be there at 5:30 am on 4/29 for her ICD implant. Pt understands that I will send her procedure & surgical scrub instruction letters via mychart. She will stop by the  office on Thursday for blood work and scrub. She will review instruction letters and ask for me/Carly when she comes in on Thursday, if she needs to review instructions further. Patient verbalized understanding and agreeable to plan.

## 2022-10-18 ENCOUNTER — Ambulatory Visit: Payer: Medicare Other | Attending: Cardiovascular Disease

## 2022-10-18 DIAGNOSIS — I255 Ischemic cardiomyopathy: Secondary | ICD-10-CM

## 2022-10-18 DIAGNOSIS — Z01812 Encounter for preprocedural laboratory examination: Secondary | ICD-10-CM | POA: Diagnosis not present

## 2022-10-18 LAB — CBC WITH DIFFERENTIAL/PLATELET
Basophils Absolute: 0 10*3/uL (ref 0.0–0.2)
Basos: 1 %
EOS (ABSOLUTE): 0.3 10*3/uL (ref 0.0–0.4)
Eos: 4 %
Hematocrit: 39.1 % (ref 34.0–46.6)
Hemoglobin: 13 g/dL (ref 11.1–15.9)
Lymphocytes Absolute: 1.4 10*3/uL (ref 0.7–3.1)
Lymphs: 18 %
MCH: 32.6 pg (ref 26.6–33.0)
MCHC: 33.2 g/dL (ref 31.5–35.7)
MCV: 98 fL — ABNORMAL HIGH (ref 79–97)
Monocytes Absolute: 0.7 10*3/uL (ref 0.1–0.9)
Monocytes: 9 %
Neutrophils Absolute: 5.1 10*3/uL (ref 1.4–7.0)
Neutrophils: 68 %
Platelets: 147 10*3/uL — ABNORMAL LOW (ref 150–450)
RBC: 3.99 x10E6/uL (ref 3.77–5.28)
RDW: 13 % (ref 11.7–15.4)
WBC: 7.5 10*3/uL (ref 3.4–10.8)

## 2022-10-18 LAB — BASIC METABOLIC PANEL
BUN/Creatinine Ratio: 21 (ref 12–28)
BUN: 23 mg/dL (ref 8–27)
CO2: 25 mmol/L (ref 20–29)
Calcium: 9.1 mg/dL (ref 8.7–10.3)
Chloride: 106 mmol/L (ref 96–106)
Creatinine, Ser: 1.08 mg/dL — ABNORMAL HIGH (ref 0.57–1.00)
Glucose: 96 mg/dL (ref 70–99)
Potassium: 4.1 mmol/L (ref 3.5–5.2)
Sodium: 140 mmol/L (ref 134–144)
eGFR: 52 mL/min/{1.73_m2} — ABNORMAL LOW (ref >59)

## 2022-10-19 NOTE — Pre-Procedure Instructions (Signed)
Instructed patient on the following items: Arrival time 5:15 Nothing to eat or drink after midnight No meds AM of procedure Responsible person to drive you home and stay with you for 24 hrs Wash with special soap night before and morning of procedure If on anti-coagulant drug instructions Plavix- last dose 4/26

## 2022-10-22 ENCOUNTER — Encounter (HOSPITAL_COMMUNITY)
Admission: RE | Disposition: A | Discharge status: Home / Self Care | Payer: Self-pay | Source: Home / Self Care | Attending: Cardiovascular Disease

## 2022-10-22 ENCOUNTER — Ambulatory Visit (HOSPITAL_COMMUNITY)
Admission: RE | Admit: 2022-10-22 | Discharge: 2022-10-22 | Disposition: A | Discharge status: Home / Self Care | Payer: Medicare Other | Attending: Cardiovascular Disease | Admitting: Cardiovascular Disease

## 2022-10-22 ENCOUNTER — Ambulatory Visit (HOSPITAL_COMMUNITY): Payer: Medicare Other

## 2022-10-22 ENCOUNTER — Other Ambulatory Visit: Payer: Self-pay

## 2022-10-22 DIAGNOSIS — I5042 Chronic combined systolic (congestive) and diastolic (congestive) heart failure: Secondary | ICD-10-CM | POA: Diagnosis not present

## 2022-10-22 DIAGNOSIS — Z79899 Other long term (current) drug therapy: Secondary | ICD-10-CM | POA: Diagnosis not present

## 2022-10-22 DIAGNOSIS — I2510 Atherosclerotic heart disease of native coronary artery without angina pectoris: Secondary | ICD-10-CM | POA: Insufficient documentation

## 2022-10-22 DIAGNOSIS — I110 Hypertensive heart disease with heart failure: Secondary | ICD-10-CM | POA: Insufficient documentation

## 2022-10-22 DIAGNOSIS — I480 Paroxysmal atrial fibrillation: Secondary | ICD-10-CM | POA: Diagnosis not present

## 2022-10-22 DIAGNOSIS — Z951 Presence of aortocoronary bypass graft: Secondary | ICD-10-CM | POA: Diagnosis not present

## 2022-10-22 DIAGNOSIS — I5022 Chronic systolic (congestive) heart failure: Secondary | ICD-10-CM

## 2022-10-22 DIAGNOSIS — I252 Old myocardial infarction: Secondary | ICD-10-CM | POA: Insufficient documentation

## 2022-10-22 DIAGNOSIS — E785 Hyperlipidemia, unspecified: Secondary | ICD-10-CM | POA: Insufficient documentation

## 2022-10-22 DIAGNOSIS — R918 Other nonspecific abnormal finding of lung field: Secondary | ICD-10-CM | POA: Diagnosis not present

## 2022-10-22 DIAGNOSIS — Z955 Presence of coronary angioplasty implant and graft: Secondary | ICD-10-CM | POA: Insufficient documentation

## 2022-10-22 DIAGNOSIS — I255 Ischemic cardiomyopathy: Secondary | ICD-10-CM | POA: Diagnosis not present

## 2022-10-22 HISTORY — PX: BIV ICD INSERTION CRT-D: EP1195

## 2022-10-22 SURGERY — BIV ICD INSERTION CRT-D

## 2022-10-22 MED ORDER — FENTANYL CITRATE (PF) 100 MCG/2ML IJ SOLN
INTRAMUSCULAR | Status: AC
  Filled 2022-10-22: qty 2

## 2022-10-22 MED ORDER — POVIDONE-IODINE 10 % EX SWAB
2.0000 | Freq: Once | CUTANEOUS | Status: AC
  Administered 2022-10-22: 2 via TOPICAL

## 2022-10-22 MED ORDER — MIDAZOLAM HCL 5 MG/5ML IJ SOLN
INTRAMUSCULAR | Status: DC | PRN
  Administered 2022-10-22: .5 mg via INTRAVENOUS
  Administered 2022-10-22: 1 mg via INTRAVENOUS
  Administered 2022-10-22: .5 mg via INTRAVENOUS
  Administered 2022-10-22: 1 mg via INTRAVENOUS

## 2022-10-22 MED ORDER — LIDOCAINE HCL (PF) 1 % IJ SOLN
INTRAMUSCULAR | Status: AC
  Filled 2022-10-22: qty 30

## 2022-10-22 MED ORDER — CEFAZOLIN SODIUM-DEXTROSE 2-4 GM/100ML-% IV SOLN: 2.0000 g | INTRAVENOUS | Status: DC

## 2022-10-22 MED ORDER — LIDOCAINE HCL (PF) 1 % IJ SOLN
INTRAMUSCULAR | Status: DC | PRN
  Administered 2022-10-22: 50 mL

## 2022-10-22 MED ORDER — SODIUM CHLORIDE 0.9 % IV SOLN
INTRAVENOUS | Status: AC
  Filled 2022-10-22: qty 2

## 2022-10-22 MED ORDER — SODIUM CHLORIDE 0.9 % IV SOLN: INTRAVENOUS | Status: DC

## 2022-10-22 MED ORDER — IOHEXOL 350 MG/ML SOLN
INTRAVENOUS | Status: DC | PRN
  Administered 2022-10-22: 10 mL

## 2022-10-22 MED ORDER — ACETAMINOPHEN 325 MG PO TABS
325.0000 mg | ORAL_TABLET | ORAL | Status: DC | PRN
  Administered 2022-10-22: 650 mg via ORAL
  Filled 2022-10-22: qty 2

## 2022-10-22 MED ORDER — MIDAZOLAM HCL 5 MG/5ML IJ SOLN
INTRAMUSCULAR | Status: AC
  Filled 2022-10-22: qty 5

## 2022-10-22 MED ORDER — CEFAZOLIN SODIUM-DEXTROSE 2-4 GM/100ML-% IV SOLN
INTRAVENOUS | Status: AC
  Filled 2022-10-22: qty 100

## 2022-10-22 MED ORDER — SODIUM CHLORIDE 0.9 % IV SOLN
80.0000 mg | INTRAVENOUS | Status: AC
  Administered 2022-10-22: 80 mg

## 2022-10-22 MED ORDER — HEPARIN (PORCINE) IN NACL 1000-0.9 UT/500ML-% IV SOLN
INTRAVENOUS | Status: DC | PRN
  Administered 2022-10-22: 500 mL

## 2022-10-22 MED ORDER — METHYLPREDNISOLONE SODIUM SUCC 125 MG IJ SOLR
125.0000 mg | Freq: Once | INTRAMUSCULAR | Status: AC
  Administered 2022-10-22: 125 mg via INTRAVENOUS
  Filled 2022-10-22: qty 2

## 2022-10-22 MED ORDER — LIDOCAINE HCL (PF) 1 % IJ SOLN
INTRAMUSCULAR | Status: AC
  Filled 2022-10-22: qty 60

## 2022-10-22 MED ORDER — FENTANYL CITRATE (PF) 100 MCG/2ML IJ SOLN
INTRAMUSCULAR | Status: DC | PRN
  Administered 2022-10-22 (×2): 25 ug via INTRAVENOUS

## 2022-10-22 MED ORDER — DIPHENHYDRAMINE HCL 50 MG/ML IJ SOLN
25.0000 mg | Freq: Once | INTRAMUSCULAR | Status: AC
  Administered 2022-10-22: 25 mg via INTRAVENOUS
  Filled 2022-10-22: qty 1

## 2022-10-22 MED ORDER — ONDANSETRON HCL 4 MG/2ML IJ SOLN: 4.0000 mg | Freq: Four times a day (QID) | INTRAMUSCULAR | Status: DC | PRN

## 2022-10-22 SURGICAL SUPPLY — 22 items
BALLN ATTAIN 80 (BALLOONS) ×1
BALLOON ATTAIN 80 (BALLOONS) IMPLANT
CABLE SURGICAL S-101-97-12 (CABLE) ×1 IMPLANT
CATH ATTAIN COM SURV 6250V-EH (CATHETERS) IMPLANT
CATH RIGHTSITE C315HIS02 (CATHETERS) IMPLANT
ICD COBALT XT CRT DTPA2D4 (ICD Generator) IMPLANT
KIT MICROPUNCTURE NIT STIFF (SHEATH) IMPLANT
LEAD ATTAIN PERFORM ST 4398-88 (Lead) IMPLANT
LEAD CAPSURE NOVUS 5076-52CM (Lead) IMPLANT
LEAD SELECT SECURE 3830 383069 (Lead) IMPLANT
LEAD SPRINT QUAT SEC 6935M-55 (Lead) IMPLANT
PAD DEFIB RADIO PHYSIO CONN (PAD) ×1 IMPLANT
SELECT SECURE 3830 383069 (Lead) ×1 IMPLANT
SHEATH 7FR PRELUDE SNAP 13 (SHEATH) IMPLANT
SHEATH 9.5FR PRELUDE SNAP 13 (SHEATH) IMPLANT
SHEATH 9FR PRELUDE SNAP 13 (SHEATH) IMPLANT
SHEATH PROBE COVER 6X72 (BAG) IMPLANT
SLITTER 6232ADJ (MISCELLANEOUS) IMPLANT
TRAY PACEMAKER INSERTION (PACKS) ×1 IMPLANT
WIRE ACUITY WHISPER EDS 4648 (WIRE) IMPLANT
WIRE HI TORQ VERSACORE-J 145CM (WIRE) IMPLANT
WIRE MICRO SET SILHO 5FR 7 (SHEATH) IMPLANT

## 2022-10-22 NOTE — H&P (Signed)
Electrophysiology Office Note:    Date:  10/22/2022   ID:  Claudia Howell, DOB 1940/07/26, MRN [id number]  PCP:  Joselyn Arrow, MD    [facility] Providers Cardiologist:  [name], MD (Inactive)     Referring MD: No ref. provider found   History of Present Illness:    Claudia Howell is a 82 y.o. female with a hx listed below, significant for CAD s/p CABG x3 in 2014 with MAZE, PCI to SVG in 2014, NSTEMI in 04/2019 treated with PCI to RCA, HTN, HLD, pAFib, and chronic combined systolic and diastolic HF , referred for arrhythmia management.  She has a history of myocardial infarction and underwent coronary bypass in 2014.  Maze procedure and left atrial appendage clip was performed at this time.  Echocardiogram in October 2023 showed severely reduced ejection fraction, 20 to 25%.  She was placed on GDMT including Entresto and carvedilol, for which she was taking for 3 months prior to repeat echocardiogram performed in February 2024.  The repeat echo showed persistently decreased EF at 20 to 25%.  The patient reports that she has been feeling much better while taking the medication.  She does have shortness of breath and fatigue.  She has not had any changes is medications or diagnoses since our clinic visit. She is being pre-treated for contrast allergy.  Past Medical History:  Diagnosis Date   Adenomatous colon polyp 03/2008   CHF (congestive heart failure) (HCC)    Constipation    Coronary artery disease    Last cath 12/14 Cath 12/8 100% SVG occlusion to RCA, 95% SVG.  The stenosis to OM.  LIMA to LAD patent but diffuse disease in LAD after graft.  Overlapping stents mid/distal SVG to OM.   Decubitus ulcer of coccyx 10/22/2012   1/2 inch raw open area on coccyx   Hyperlipidemia    Hypertension    Dr.Henry Katrinka Blazing   Myocardial infarction [facility])    Osteopenia 02/2008   PAF (paroxysmal atrial fibrillation) (HCC)    during cath/notes 10/16/2012   Right posterior capsular  opacification 11/16/2019   S/P CABG x 3 10/22/2012   LIMA to LAD, SVG to distal RCA, SVG to OM, EVH from right thigh   S/P Maze operation for atrial fibrillation 10/22/2012   Left side lesion set using bipolar radiofrequency and cryothermy ablation with clipping of LA appendage   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    Past Surgical History:  Procedure Laterality Date   BREAST CYST EXCISION Left 1980-1990's   x 3   CARDIAC CATHETERIZATION  10/16/2012   CATARACT EXTRACTION, BILATERAL Bilateral    03/2015 and 05/2015   CATARACT EXTRACTION, BILATERAL Bilateral approx 2016   CORONARY ANGIOPLASTY WITH STENT PLACEMENT  04/01/2013   "1" (06/01/2013)   CORONARY ARTERY BYPASS GRAFT N/A 10/22/2012   Procedure: CORONARY ARTERY BYPASS GRAFTING (CABG);  Surgeon: Purcell Nails, MD;  Location: [facility] OR;  Service: Open Heart Surgery;  Laterality: N/A;   CORONARY ATHERECTOMY N/A 04/29/2019   Procedure: CORONARY ATHERECTOMY;  Surgeon: Swaziland, Peter M, MD;  Location: [facility] INVASIVE CV LAB;  Service: Cardiovascular;  Laterality: N/A;   CORONARY CTO INTERVENTION N/A 04/29/2019   Procedure: CORONARY CTO INTERVENTION;  Surgeon: Swazil[place], Peter M, MD;  Location: [facility] INVASIVE CV LAB;  Service: Cardiovascular;  Laterality: N/A;   CORONARY STENT INTERVENTION N/A 01/01/2019   Procedure: CORONARY STENT INTERVENTION;  Surgeon: Lyn Records, MD;  Location: [facility] INVASIVE CV LAB;  Service:  Cardiovascular;  Laterality: N/A;   DILATION AND CURETTAGE OF UTERUS     INTRAOPERATIVE TRANSESOPHAGEAL ECHOCARDIOGRAM N/A 10/22/2012   Procedure: INTRAOPERATIVE TRANSESOPHAGEAL ECHOCARDIOGRAM;  Surgeon: Purcell Nails, MD;  Location: [facility] OR;  Service: Open Heart Surgery;  Laterality: N/A;   LEFT HEART CATH AND CORS/GRAFTS ANGIOGRAPHY N/A 01/01/2019   Procedure: LEFT HEART CATH AND CORS/GRAFTS ANGIOGRAPHY;  Surgeon: Lyn Records, MD;  Location: [facility] INVASIVE CV LAB;  Service: Cardiovascular;  Laterality: N/A;   LEFT HEART CATH AND  CORS/GRAFTS ANGIOGRAPHY N/A 04/28/2019   Procedure: LEFT HEART CATH AND CORS/GRAFTS ANGIOGRAPHY;  Surgeon: Lyn Records, MD;  Location: [facility] INVASIVE CV LAB;  Service: Cardiovascular;  Laterality: N/A;   LEFT HEART CATHETERIZATION WITH CORONARY ANGIOGRAM N/A 10/16/2012   Procedure: LEFT HEART CATHETERIZATION WITH CORONARY ANGIOGRAM;  Surgeon: [name], MD;  Location: [facility] CATH LAB;  Service: Cardiovascular;  Laterality: N/A;   LEFT HEART CATHETERIZATION WITH CORONARY/GRAFT ANGIOGRAM N/A 06/01/2013   Procedure: LEFT HEART CATHETERIZATION WITH Isabel Caprice;  Surgeon: [name], MD;  Location: [facility] CATH LAB;  Service: Cardiovascular;  Laterality: N/A;   MAZE N/A 10/22/2012   Procedure: MAZE;  Surgeon: Purcell Nails, MD;  Location: [facility] OR;  Service: Open Heart Surgery;  Laterality: N/A;   PERCUTANEOUS CORONARY STENT INTERVENTION (PCI-S)  06/01/2013   Procedure: PERCUTANEOUS CORONARY STENT INTERVENTION (PCI-S);  Surgeon: [name], MD;  Location: [facility] CATH LAB;  Service: Cardiovascular;;   RIGHT/LEFT HEART CATH AND CORONARY/GRAFT ANGIOGRAPHY N/A 05/01/2022   Procedure: RIGHT/LEFT HEART CATH AND CORONARY/GRAFT ANGIOGRAPHY;  Surgeon: Lyn Records, MD;  Location: [facility] INVASIVE CV LAB;  Service: Cardiovascular;  Laterality: N/A;   TONSILLECTOMY  1949   TOTAL ABDOMINAL HYSTERECTOMY W/ BILATERAL SALPINGOOPHORECTOMY  1990   benign growth    Current Medications: Current Facility-Administered Medications for the 10/22/22 encounter [facility] Encounter)  Medication   sodium chloride flush (NS) 0.9 % injection 3 mL   Current Meds  Medication Sig   atorvastatin (LIPITOR) 10 MG tablet Take 1 tablet (10 mg total) by mouth daily.   carvedilol (COREG) 3.125 MG tablet Take 2 tablets (6.25 mg total) by mouth 2 (two) times daily.   cholecalciferol (VITAMIN D) 1000 UNITS tablet Take 1,000 Units by mouth in the morning.   clopidogrel (PLAVIX) 75 MG tablet Take 1 tablet (75 mg total) by mouth  daily.   Coenzyme Q10 (COQ10 PO) Take 1 capsule by mouth in the morning.   dapagliflozin propanediol (FARXIGA) 10 MG TABS tablet Take 1 tablet (10 mg total) by mouth daily before breakfast.   digoxin (LANOXIN) 0.125 MG tablet Take 0.5 tablets (0.0625 mg total) by mouth daily.   diphenhydramine-acetaminophen (TYLENOL PM) 25-500 MG TABS tablet Take 1.5 tablets by mouth at bedtime as needed (sleep).   furosemide (LASIX) 20 MG tablet Take 1 tablet (20 mg total) by mouth 3 (three) times a week. Take on Mondays, Wednesdays, and Fridays. (Patient taking differently: Take 20 mg by mouth every other day.)   MAGNESIUM PO Take 1 tablet by mouth in the morning.   Multiple Vitamin (MULTIVITAMIN) tablet Take 1 tablet by mouth in the morning.   nitroGLYCERIN (NITROSTAT) 0.4 MG SL tablet PLACE AND DISSOLVE 1 TABLET UNDER THE TONGUE EVERY 5 MINUTE AS NEEDED FOR CHEST PAIN   polyethylene glycol (MIRALAX / GLYCOLAX) 17 g packet Take 17 g by mouth in the morning.   Potassium 99 MG TABS Take 3 tablets by mouth in the morning.  sacubitril-valsartan (ENTRESTO) 24-26 MG Take 1 tablet by mouth 2 (two) times daily.     Allergies:   Contrast media [iodinated contrast media], Pravastatin, Simvastatin, Tramadol hcl, Zetia [ezetimibe], Oxycodone, and Repatha [evolocumab]   Social and Family History: Reviewed in Epic  ROS:   Please see the history of present illness.    All other systems reviewed and are negative.  EKGs/Labs/Other Studies Reviewed Today:    Echocardiogram:  TTE 08/21/22 EF 30-35% global hypokinesis  TTE 04/11/22 EF 20-25%  Monitors:  Advanced imaging:   EKG:  Last EKG results: today - sinus rhythm, non-specific intraventricular conduction delay.   Recent Labs: 05/23/2022: ALT 7 10/18/2022: BUN 23; Creatinine, Ser 1.08; Hemoglobin 13.0; Platelets 147; Potassium 4.1; Sodium 140     Physical Exam:    VS:  BP (!) 162/93   Pulse 86   Temp (!) 97.3 F (36.3 C) (Temporal)   Resp 18    Ht 5\' 6"  (1.676 m)   Wt 59 kg   SpO2 95%   BMI 20.98 kg/m     Wt Readings from Last 3 Encounters:  10/22/22 59 kg  10/08/22 60.2 kg  09/10/22 58.8 kg     GEN: Well nourished, well developed in no acute distress CARDIAC: RRR, no murmurs, rubs, gallops RESPIRATORY:  Normal work of breathing MUSCULOSKELETAL: no edema    ASSESSMENT & PLAN:    Chronic congestive heart failure, NYHA II -Continue carvedilol 6.25 mg, dapagliflozin 10 mg, sacubitril valsartan 49-51, spironolactone 12.5 mg  Ischemic cardiomyopathy -EF has remained at 20 to 25% despite GDMT for greater than 3 months.  I recommended placement of an ICD for primary prevention of sudden cardiac death. -I discussed the indication for the procedure and the logistics, risks, potential benefit, and after care. I specifically explained that risks include but are not limited to infection, bleeding,damage to blood vessels, lung, and the heart -- but risk of prolonged hospitalization, need for surgery, or the event of stroke, heart attack, or death are low but not zero.  -She is hesitant to schedule the procedure today.  I did reiterate to her that she is at unprotected risk of cardiac death due to arrhythmia without the device.  Additionally, the CRT function could improve her fatigue, improve her heart function, reduce her risk of hospitalization for CHF or risk of death.   Nonspecific intraventricular conduction delay -QRS is about 160 ms -would place LV lead for CRT.         Medication Adjustments/Labs and Tests Ordered: Current medicines are reviewed at length with the patient today.  Concerns regarding medicines are outlined above.  Orders Placed This Encounter  Procedures   Informed Consent Details: Physician/Practitioner Attestation; Transcribe to consent form and obtain patient signature   Initiate Pre-op Protocol   Apply Cardiac Implantable Device Care Plan   Void on call to EP Lab   Electrode Placement Place arm  electrodes on posterior shoulders   Confirm CBC and BMP (or CMP) results within 7 days for inpatient and 30 days for outpatient:   Pre-admission testing diagnosis   Use clippers to remove hair, entire chest area   EP PPM/ICD IMPLANT   Insert peripheral IV   Meds ordered this encounter  Medications   0.9 %  sodium chloride infusion   gentamicin (GARAMYCIN) 80 mg in sodium chloride 0.9 % 500 mL irrigation   ceFAZolin (ANCEF) IVPB 2g/100 mL premix    Order Specific Question:   Indication:    Answer:  Surgical Prophylaxis   povidone-iodine 10 % swab 2 Application   diphenhydrAMINE (BENADRYL) injection 25 mg   methylPREDNISolone sodium succinate (SOLU-MEDROL) 125 mg/2 mL injection 125 mg     Signed, Maurice Small, MD  10/22/2022 7:10 AM    [facility]

## 2022-10-22 NOTE — Discharge Instructions (Signed)
After Your ICD (Implantable Cardiac Defibrillator)   You have a [employer]onic ICD  ACTIVITY Do not lift your arm above shoulder height for 1 week after your procedure. After 7 days, you may progress as below.  You should remove your sling 24 hours after your procedure, unless otherwise instructed by your provider.     Monday Oct 29, 2022  Tuesday Oct 30, 2022 Wednesday Oct 31, 2022 Thursday Nov 01, 2022   Do not lift, push, pull, or carry anything over 10 pounds with the affected arm until 6 weeks (Monday December 03, 2022 ) after your procedure.   You may drive AFTER your wound check, unless you have been told otherwise by your provider.   Ask your healthcare provider when you can go back to work   INCISION/Dressing If you are on a blood thinner such as Plavix,  please confirm with your provider when this should be resumed. ??  If large square, outer bandage is left in place, this can be removed after 24 hours from your procedure. Do not remove steri-strips or glue as below.   Monitor your defibrillator site for redness, swelling, and drainage. Call the device clinic at [phone] if you experience these symptoms or fever/chills.  If your incision is sealed with Steri-strips or staples, you may shower 7 days after your procedure or when told by your provider. Do not remove the steri-strips or let the shower hit directly on your site. You may wash around your site with soap and water.    If you were discharged in a sling, please do not wear this during the day more than 48 hours after your surgery unless otherwise instructed. This may increase the risk of stiffness and soreness in your shoulder.   Avoid lotions, ointments, or perfumes over your incision until it is well-healed.  You may use a hot tub or a pool AFTER your wound check appointment if the incision is completely closed.  Your ICD is designed to protect you from life threatening heart rhythms. Because of this, you may receive a  shock.   1 shock with no symptoms:  Call the office during business hours. 1 shock with symptoms (chest pain, chest pressure, dizziness, lightheadedness, shortness of breath, overall feeling unwell):  Call 911. If you experience 2 or more shocks in 24 hours:  Call 911. If you receive a shock, you should not drive for 6 months per the [place] DMV IF you receive appropriate therapy from your ICD.   ICD Alerts:  Some alerts are vibratory and others beep. These are NOT emergencies. Please call our office to let us know. If this occurs at night or on weekends, it can wait until the next business day. Send a remote transmission.  If your device is capable of reading fluid status (for heart failure), you will be offered monthly monitoring to review this with you.   DEVICE MANAGEMENT Remote monitoring is used to monitor your ICD from home. This monitoring is scheduled every 91 days by our office. It allows [place] to keep an eye on the functioning of your device to ensure it is working properly. You will routinely see your Electrophysiologist annually (more often if necessary).   You should receive your ID card for your new device in 4-8 weeks. Keep this card with you at all times once received. Consider wearing a medical alert bracelet or necklace.  Your ICD  may be MRI compatible. This will be discussed at your next office visit/wound check.  You should avoid contact with strong electric or magnetic fields.   Do not use amateur (ham) radio equipment or electric (arc) welding torches. MP3 player headphones with magnets should not be used. Some devices are safe to use if held at least 12 inches (30 cm) from your defibrillator. These include power tools, lawn mowers, and speakers. If you are unsure if something is safe to use, ask your health care provider.  When using your cell phone, hold it to the ear that is on the opposite side from the defibrillator. Do not leave your cell phone in a pocket over the  defibrillator.  You may safely use electric blankets, heating pads, computers, and microwave ovens.  Call the office right away if: You have chest pain. You feel more than one shock. You feel more short of breath than you have felt before. You feel more light-headed than you have felt before. Your incision starts to open up.  This information is not intended to replace advice given to you by your health care provider. Make sure you discuss any questions you have with your health care provider.

## 2022-10-23 ENCOUNTER — Encounter (HOSPITAL_COMMUNITY): Payer: Self-pay | Admitting: Cardiovascular Disease

## 2022-11-01 ENCOUNTER — Ambulatory Visit: Payer: Medicare Other | Attending: Internal Medicine

## 2022-11-01 DIAGNOSIS — I5022 Chronic systolic (congestive) heart failure: Secondary | ICD-10-CM | POA: Diagnosis not present

## 2022-11-01 DIAGNOSIS — I447 Left bundle-branch block, unspecified: Secondary | ICD-10-CM

## 2022-11-01 LAB — CUP PACEART INCLINIC DEVICE CHECK
Date Time Interrogation Session: 20240509121629
Implantable Lead Connection Status: 753985
Implantable Lead Connection Status: 753985
Implantable Lead Connection Status: 753985
Implantable Lead Implant Date: 20240429
Implantable Lead Implant Date: 20240429
Implantable Lead Implant Date: 20240429
Implantable Lead Location: 753859
Implantable Lead Location: 753860
Implantable Lead Location: 753860
Implantable Lead Model: 3830
Implantable Lead Model: 5076
Implantable Pulse Generator Implant Date: 20240429

## 2022-11-01 NOTE — Progress Notes (Signed)
Wound check appointment. Steri-strips removed. Wound without redness or edema.  Slight bruising noted.  Incision edges approximated, wound well healed. Normal device function. Thresholds, sensing, and impedances consistent with implant measurements. Device programmed at 3.5V for extra safety margin until 3 month visit. Histogram distribution appropriate for patient and level of activity. No mode switches or ventricular arrhythmias noted. Patient educated about wound care, arm mobility, lifting restrictions, shock plan. ROV in 3 months with implanting physician.

## 2022-11-01 NOTE — Patient Instructions (Signed)

## 2022-11-17 NOTE — Progress Notes (Unsigned)
Cardiology Office Note:    Date:  11/20/2022   ID:  Claudia Howell, DOB 1940/07/11, MRN [id number]  PCP:  Joselyn Arrow, MD   [facility] Providers Cardiologist:  [name], MD (Inactive) {  Referring MD: Joselyn Arrow, MD    History of Present Illness:    Claudia Howell is a 82 y.o. female with a hx of CAD s/p CABG x3 in 2014 with MAZE, PCI to SVG in 2014, NSTEMI in 04/2019 treated with PCI to RCA, HTN, HLD, pAFib, and chronic combined systolic and diastolic HF who was previously followed by Dr. Katrinka Blazing who now presents to clinic for follow-up.  Last cath 04/2022 with patent LIMA to LAD, patent RCA with patent RCA stents, patent Lcx stent. Stable anatomy since 2020. RHC with EF <25%, severe pulmonary HTN with PASP , PVR 3.49 WU, PCWP . TTE 03/2022 with EF 20-25%, G3DD, akinesis of the basal-mid inferior, distal lateral apical walls, hypokinesis elsewhere, G3DD, normal RV, mild MR.  Was seen by Dr. Gala Romney on 09/2022 where she was struggling with ADLs. Dig was added at that visit and her BB and entresto dosing were decreased. She subsequently underwent CRT-D with Dr. Nelly Laurence in 10/22/22.  Today, the patient states she has some increased energy after CRT-D placement. She is doing more of her ADLs around the house but has to take frequent breaks. No chest pain, SOB, LE edema, orthopnea or PND. Dyspnea on exertion has improved some. Blood pressures mainly 110-115s/50-60s.   Past Medical History:  Diagnosis Date   Adenomatous colon polyp 03/2008   CHF (congestive heart failure) (HCC)    Constipation    Coronary artery disease    Last cath 12/14 Cath 12/8 100% SVG occlusion to RCA, 95% SVG.  The stenosis to OM.  LIMA to LAD patent but diffuse disease in LAD after graft.  Overlapping stents mid/distal SVG to OM.   Decubitus ulcer of coccyx 10/22/2012   1/2 inch raw open area on coccyx   Hyperlipidemia    Hypertension    Dr.Henry Katrinka Blazing   Myocardial infarction [facility])     Osteopenia 02/2008   PAF (paroxysmal atrial fibrillation) (HCC)    during cath/notes 10/16/2012   Right posterior capsular opacification 11/16/2019   S/P CABG x 3 10/22/2012   LIMA to LAD, SVG to distal RCA, SVG to OM, EVH from right thigh   S/P Maze operation for atrial fibrillation 10/22/2012   Left side lesion set using bipolar radiofrequency and cryothermy ablation with clipping of LA appendage   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    Past Surgical History:  Procedure Laterality Date   BIV ICD INSERTION CRT-D N/A 10/22/2022   Procedure: BIV ICD INSERTION CRT-D;  Surgeon: Maurice Small, MD;  Location: [facility] INVASIVE CV LAB;  Service: Cardiovascular;  Laterality: N/A;   BREAST CYST EXCISION Left 1980-1990's   x 3   CARDIAC CATHETERIZATION  10/16/2012   CATARACT EXTRACTION, BILATERAL Bilateral    03/2015 and 05/2015   CATARACT EXTRACTION, BILATERAL Bilateral approx 2016   CORONARY ANGIOPLASTY WITH STENT PLACEMENT  04/01/2013   "1" (06/01/2013)   CORONARY ARTERY BYPASS GRAFT N/A 10/22/2012   Procedure: CORONARY ARTERY BYPASS GRAFTING (CABG);  Surgeon: Purcell Nails, MD;  Location: [facility] OR;  Service: Open Heart Surgery;  Laterality: N/A;   CORONARY ATHERECTOMY N/A 04/29/2019   Procedure: CORONARY ATHERECTOMY;  Surgeon: Swaziland, Peter M, MD;  Location: [facility] INVASIVE CV LAB;  Service: Cardiovascular;  Laterality: N/A;  CORONARY CTO INTERVENTION N/A 04/29/2019   Procedure: CORONARY CTO INTERVENTION;  Surgeon: Swaziland, Peter M, MD;  Location: [facility] INVASIVE CV LAB;  Service: Cardiovascular;  Laterality: N/A;   CORONARY STENT INTERVENTION N/A 01/01/2019   Procedure: CORONARY STENT INTERVENTION;  Surgeon: Lyn Records, MD;  Location: [facility] INVASIVE CV LAB;  Service: Cardiovascular;  Laterality: N/A;   DILATION AND CURETTAGE OF UTERUS     INTRAOPERATIVE TRANSESOPHAGEAL ECHOCARDIOGRAM N/A 10/22/2012   Procedure: INTRAOPERATIVE TRANSESOPHAGEAL ECHOCARDIOGRAM;  Surgeon: Purcell Nails, MD;   Location: [facility] OR;  Service: Open Heart Surgery;  Laterality: N/A;   LEFT HEART CATH AND CORS/GRAFTS ANGIOGRAPHY N/A 01/01/2019   Procedure: LEFT HEART CATH AND CORS/GRAFTS ANGIOGRAPHY;  Surgeon: Lyn Records, MD;  Location: [facility] INVASIVE CV LAB;  Service: Cardiovascular;  Laterality: N/A;   LEFT HEART CATH AND CORS/GRAFTS ANGIOGRAPHY N/A 04/28/2019   Procedure: LEFT HEART CATH AND CORS/GRAFTS ANGIOGRAPHY;  Surgeon: Lyn Records, MD;  Location: [facility] INVASIVE CV LAB;  Service: Cardiovascular;  Laterality: N/A;   LEFT HEART CATHETERIZATION WITH CORONARY ANGIOGRAM N/A 10/16/2012   Procedure: LEFT HEART CATHETERIZATION WITH CORONARY ANGIOGRAM;  Surgeon: [name], MD;  Location: [facility] CATH LAB;  Service: Cardiovascular;  Laterality: N/A;   LEFT HEART CATHETERIZATION WITH CORONARY/GRAFT ANGIOGRAM N/A 06/01/2013   Procedure: LEFT HEART CATHETERIZATION WITH Isabel Caprice;  Surgeon: [name], MD;  Location: [facility] CATH LAB;  Service: Cardiovascular;  Laterality: N/A;   MAZE N/A 10/22/2012   Procedure: MAZE;  Surgeon: Purcell Nails, MD;  Location: [facility] OR;  Service: Open Heart Surgery;  Laterality: N/A;   PERCUTANEOUS CORONARY STENT INTERVENTION (PCI-S)  06/01/2013   Procedure: PERCUTANEOUS CORONARY STENT INTERVENTION (PCI-S);  Surgeon: [name], MD;  Location: [facility] CATH LAB;  Service: Cardiovascular;;   RIGHT/LEFT HEART CATH AND CORONARY/GRAFT ANGIOGRAPHY N/A 05/01/2022   Procedure: RIGHT/LEFT HEART CATH AND CORONARY/GRAFT ANGIOGRAPHY;  Surgeon: Lyn Records, MD;  Location: [facility] INVASIVE CV LAB;  Service: Cardiovascular;  Laterality: N/A;   TONSILLECTOMY  1949   TOTAL ABDOMINAL HYSTERECTOMY W/ BILATERAL SALPINGOOPHORECTOMY  1990   benign growth    Current Medications: Current Meds  Medication Sig   atorvastatin (LIPITOR) 10 MG tablet Take 1 tablet (10 mg total) by mouth daily.   carvedilol (COREG) 3.125 MG tablet Take 2 tablets (6.25 mg total) by mouth 2 (two) times daily.    cholecalciferol (VITAMIN D) 1000 UNITS tablet Take 1,000 Units by mouth in the morning.   clopidogrel (PLAVIX) 75 MG tablet Take 1 tablet (75 mg total) by mouth daily.   Coenzyme Q10 (COQ10 PO) Take 1 capsule by mouth in the morning.   dapagliflozin propanediol (FARXIGA) 10 MG TABS tablet Take 1 tablet (10 mg total) by mouth daily before breakfast.   digoxin (LANOXIN) 0.125 MG tablet Take 0.5 tablets (0.0625 mg total) by mouth daily.   diphenhydramine-acetaminophen (TYLENOL PM) 25-500 MG TABS tablet Take 1.5 tablets by mouth at bedtime as needed (sleep).   furosemide (LASIX) 20 MG tablet Take 1 tablet (20 mg total) by mouth 3 (three) times a week. Take on Mondays, Wednesdays, and Fridays. (Patient taking differently: Take 20 mg by mouth every other day.)   MAGNESIUM PO Take 1 tablet by mouth in the morning.   Multiple Vitamin (MULTIVITAMIN) tablet Take 1 tablet by mouth in the morning.   nitroGLYCERIN (NITROSTAT) 0.4 MG SL tablet PLACE AND DISSOLVE 1 TABLET UNDER THE TONGUE EVERY 5 MINUTE AS NEEDED FOR CHEST PAIN  polyethylene glycol (MIRALAX / GLYCOLAX) 17 g packet Take 17 g by mouth in the morning.   Potassium 99 MG TABS Take 3 tablets by mouth in the morning.   sacubitril-valsartan (ENTRESTO) 24-26 MG Take 1 tablet by mouth 2 (two) times daily.   spironolactone (ALDACTONE) 25 MG tablet Take 0.5 tablets (12.5 mg total) by mouth daily.   Current Facility-Administered Medications for the 11/20/22 encounter (Office Visit) with Meriam Sprague, MD  Medication   sodium chloride flush (NS) 0.9 % injection 3 mL     Allergies:   Contrast media [iodinated contrast media], Pravastatin, Simvastatin, Tramadol hcl, Zetia [ezetimibe], Oxycodone, and Repatha [evolocumab]   Social History   Socioeconomic History   Marital status: Widowed    Spouse name: Not on file   Number of children: 2   Years of education: 58   Highest education level: Associate degree: occupational, Scientist, product/process development, or vocational  program  Occupational History   Occupation: retired    Comment: LPN  Tobacco Use   Smoking status: Never   Smokeless tobacco: Never  Vaping Use   Vaping Use: Never used  Substance and Sexual Activity   Alcohol use: Yes    Alcohol/week: 2.0 standard drinks of alcohol    Types: 2 Glasses of wine per week    Comment: 2/month   Drug use: No   Sexual activity: Not Currently  Other Topics Concern   Not on file  Social History Narrative   Widowed.  Lives alone.  Retired Public house manager.  1 son in [place], 1 son in [place]. 7 grandchildren, 2 great granddaughters, 1 great grandson (in [place]), planning to adopt a child from [place] in spring 2024.      Updated 04/2022   Social Determinants of Health   Financial Resource Strain: Low Risk  (05/26/2019)   Overall Financial Resource Strain (CARDIA)    Difficulty of Paying Living Expenses: Not hard at all  Food Insecurity: No Food Insecurity (10/09/2022)   Hunger Vital Sign    Worried About Running Out of Food in the Last Year: Never true    Ran Out of Food in the Last Year: Never true  Transportation Needs: No Transportation Needs (10/09/2022)   PRAPARE - Administrator, Civil Service (Medical): No    Lack of Transportation (Non-Medical): No  Physical Activity: Insufficiently Active (05/26/2019)   Exercise Vital Sign    Days of Exercise per Week: 5 days    Minutes of Exercise per Session: 20 min  Stress: No Stress Concern Present (05/26/2019)   [employer] of Occupational Health - Occupational Stress Questionnaire    Feeling of Stress : Not at all  Social Connections: Unknown (05/26/2019)   Social Connection and Isolation Panel [NHANES]    Frequency of Communication with Friends and Family: Not asked    Frequency of Social Gatherings with Friends and Family: Not asked    Attends Religious Services: Not on file    Active Member of Clubs or Organizations: Not on file    Attends Banker Meetings: Not asked    Marital Status:  Widowed     Family History: The patient's family history includes Cancer in her brother; Heart attack (age of onset: 36) in her mother; Heart disease in an other family member; Heart disease (age of onset: 36) in her brother; Hypertension in her brother and mother; Prostate cancer in her brother. There is no history of Diabetes, Breast cancer, Colon cancer, Stomach cancer, Rectal cancer, Liver cancer, Pancreatic  cancer, or Esophageal cancer.  ROS:   Please see the history of present illness.     All other systems reviewed and are negative.  EKGs/Labs/Other Studies Reviewed:    The following studies were reviewed today: RHC/LHC 04/2022: CONCLUSIONS: Acute on chronic systolic and diastolic heart failure with EF less than 25%, LVEDP 29 mmHg, and cardiac output 4.3 L/min.  Regional wall motion abnormality identified. Severe pulmonary hypertension with PA mean 47 mmHg, pulmonary vascular resistance 3.49 Wood units, and pulmonary capillary wedge pressure mean 32 mmHg.  V wave to 43 mmHg.  WHO group 2 etiology. Coronary anatomy is unchanged from 2020.  The stented graft to the circumflex is widely patent.  The LIMA to the LAD is widely patent.  The native right coronary is widely patent within the previously stented proximal and mid segment.  Moderate disease is noted distally. RECOMMENDATIONS: Again attempt to institute guideline directed therapy for systolic heart failure.  ACE inhibitor, Tenormin, and HCTZ will be discontinued.  Entresto 24/26 mg twice daily, carvedilol 12.5 mg p.o. twice daily, and Marcelline Deist will be started.  Kidney function will be assessed in 1 week at which time hopefully an MRA can be added. Hemostasis with minx device. Discussed with the daughter, Wynona Canes. Will need follow-up blood work in 1 week and OV with APP or me in 1 to 2 weeks.   TTE 03/2022: IMPRESSIONS     1. LV function is severely depressed with akinesis of the base/mid  septum, base/mid inferior, distal  lateral and apical walls; Hypokinesis  elsewhere except for base/mid anteiror wall that appears to thicken more  normally. Compared to previous echo in  2020, LV function is now severely reduced.. Left ventricular ejection  fraction, by estimation, is 20 to 25%. The left ventricle has severely  decreased function. Left ventricular diastolic parameters are consistent  with Grade III diastolic dysfunction  (restrictive). Elevated left atrial pressure.   2. Right ventricular systolic function is moderately reduced. The right  ventricular size is normal.   3. Right atrial size was mildly dilated.   4. Mild mitral valve regurgitation.   5. The aortic valve is tricuspid. Aortic valve regurgitation is trivial.   EKG:  EKG is not ordered today.    Recent Labs: 05/23/2022: ALT 7 10/18/2022: BUN 23; Creatinine, Ser 1.08; Hemoglobin 13.0; Platelets 147; Potassium 4.1; Sodium 140  Recent Lipid Panel    Component Value Date/Time   CHOL 139 04/25/2022 1545   TRIG 172 (H) 04/25/2022 1545   HDL 40 04/25/2022 1545   CHOLHDL 3.5 04/25/2022 1545   CHOLHDL 2.9 04/25/2019 0819   VLDL 21 04/25/2019 0819   LDLCALC 70 04/25/2022 1545     Risk Assessment/Calculations:    CHA2DS2-VASc Score = 6   This indicates a 9.7% annual risk of stroke. The patient's score is based upon: CHF History: 1 HTN History: 1 Diabetes History: 0 Stroke History: 0 Vascular Disease History: 1 Age Score: 2 Gender Score: 1            Physical Exam:    VS:  BP 100/60   Pulse 84   Ht 5\' 6"  (1.676 m)   Wt 128 lb 12.8 oz (58.4 kg)   SpO2 97%   BMI 20.79 kg/m     Wt Readings from Last 3 Encounters:  11/20/22 128 lb 12.8 oz (58.4 kg)  10/22/22 130 lb (59 kg)  10/08/22 132 lb 12.8 oz (60.2 kg)     GEN:  Well nourished, well developed  in no acute distress HEENT: Normal NECK: No JVD; No carotid bruits CARDIAC: RRR, CTAB, no wheezes ABDOMEN: Soft, non-tender, non-distended MUSCULOSKELETAL:  No edema; No  deformity. Warm SKIN: Warm and dry NEUROLOGIC:  Alert and oriented x 3 PSYCHIATRIC:  Normal affect   ASSESSMENT:    1. Chronic systolic heart failure (HCC)   2. Left bundle branch block (LBBB)   3. Ischemic cardiomyopathy   4. Essential hypertension   5. S/P Maze operation for atrial fibrillation   6. Paroxysmal atrial fibrillation (HCC)   7. Pure hypercholesterolemia     PLAN:    In order of problems listed above:  #Chronic Combined Systolic and Diastolic HF: TTE 16/1096 with LVEF 20-25% with akinesis of the basal to mid septum, basal to mid inferior, distal lateral and apical walls. G3DD, mild MR. Currently with NYHA III B symptoms. Follows with Dr. Gala Romney. -Follow-up with Dr. Gala Romney as scheduled -Continue lasix 20mg  M,W, F -Continue coreg 3.125mg  BID -Continue farxiga 10mg  daily -Continue entresto 24-26mg  BID -Continue spironolactone 12.5mg  daily -Continue dig 0.0625mg  daily -S/p CRT-D with Dr. Nelly Laurence  #Known CAD s/p CABG with subsequent PCI: [facility] 04/2022 with patent LIMA to LAD, patent RCA stents, patent Lcx stent. LVEF severely depressed 20-25% as detailed above. Currently, without anginal symptoms.  -Continue lipitor 10mg  daily -Continue plavix 75mg  daily  #pAFib: -S/p MAZE with no known recurrence -Not on AC  #Pulmonary HTN: PASP 47. Group 2. Will manage HF as above.  #HTN: Blood pressure controlled -Continue coreg 3.125mg  BID -Continue entresto 24-26mg  BID -Continue spironolactone 12.5mg  daily  #HLD: -Continue lipitor 10mg  daily -LDL 70 in 04/2022           Medication Adjustments/Labs and Tests Ordered: Current medicines are reviewed at length with the patient today.  Concerns regarding medicines are outlined above.  No orders of the defined types were placed in this encounter.  No orders of the defined types were placed in this encounter.   Patient Instructions  Medication Instructions:   Your physician recommends that you continue on  your current medications as directed. Please refer to the Current Medication list given to you today.  *If you need a refill on your cardiac medications before your next appointment, please call your pharmacy*      Follow-Up:  WITH DR. Gala Romney IN CHF CLINIC IN JULY 2024 FOR YOUR 3 MONTH FOLLOW-UP PER RECALL    Signed, Meriam Sprague, MD  11/20/2022 3:20 PM    [facility]

## 2022-11-20 ENCOUNTER — Encounter: Payer: Self-pay | Admitting: Cardiology

## 2022-11-20 ENCOUNTER — Ambulatory Visit: Payer: Medicare Other | Attending: Cardiology | Admitting: Cardiology

## 2022-11-20 VITALS — BP 100/60 | HR 84 | Ht 66.0 in | Wt 128.8 lb

## 2022-11-20 DIAGNOSIS — I447 Left bundle-branch block, unspecified: Secondary | ICD-10-CM | POA: Insufficient documentation

## 2022-11-20 DIAGNOSIS — E7800 Pure hypercholesterolemia, unspecified: Secondary | ICD-10-CM | POA: Diagnosis not present

## 2022-11-20 DIAGNOSIS — Z9889 Other specified postprocedural states: Secondary | ICD-10-CM | POA: Diagnosis not present

## 2022-11-20 DIAGNOSIS — Z8679 Personal history of other diseases of the circulatory system: Secondary | ICD-10-CM | POA: Insufficient documentation

## 2022-11-20 DIAGNOSIS — I480 Paroxysmal atrial fibrillation: Secondary | ICD-10-CM | POA: Insufficient documentation

## 2022-11-20 DIAGNOSIS — I10 Essential (primary) hypertension: Secondary | ICD-10-CM | POA: Diagnosis not present

## 2022-11-20 DIAGNOSIS — I255 Ischemic cardiomyopathy: Secondary | ICD-10-CM | POA: Diagnosis not present

## 2022-11-20 DIAGNOSIS — I5022 Chronic systolic (congestive) heart failure: Secondary | ICD-10-CM | POA: Diagnosis not present

## 2022-11-20 NOTE — Patient Instructions (Signed)
Medication Instructions:   Your physician recommends that you continue on your current medications as directed. Please refer to the Current Medication list given to you today.  *If you need a refill on your cardiac medications before your next appointment, please call your pharmacy*      Follow-Up:  WITH DR. Gala Romney IN CHF CLINIC IN JULY 2024 FOR YOUR 3 MONTH FOLLOW-UP PER RECALL

## 2022-11-22 ENCOUNTER — Telehealth (HOSPITAL_COMMUNITY): Payer: Self-pay

## 2022-11-22 NOTE — Telephone Encounter (Signed)
Advanced Heart Failure Patient Advocate Encounter  Application for Entresto faxed to [employer] on 11/22/2022. Application form attached to patient chart.  Burnell Blanks, CPhT Rx Patient Advocate Phone: [phone]

## 2022-11-23 NOTE — Telephone Encounter (Signed)
Advanced Heart Failure Patient Advocate Encounter  Patient was approved to receive Entresto from [employer] Effective 11/22/2022 to 06/25/2023  

## 2022-12-11 ENCOUNTER — Telehealth (HOSPITAL_COMMUNITY): Payer: Self-pay | Admitting: Pharmacy Technician

## 2022-12-11 NOTE — Telephone Encounter (Signed)
Medication Samples have been provided to the patient.  Drug name: Sherryll Burger       Strength: 24-26mg         Qty: one bottle  LOT: [id number]  Exp.Date: 08/25  Dosing instructions: Take one tablet twice daily.  The patient has been instructed regarding the correct time, dose, and frequency of taking this medication, including desired effects and most common side effects.   Claudia Howell Claudia Howell 3:02 PM 12/11/2022

## 2023-01-22 ENCOUNTER — Ambulatory Visit: Payer: Medicare Other

## 2023-01-22 DIAGNOSIS — I447 Left bundle-branch block, unspecified: Secondary | ICD-10-CM | POA: Diagnosis not present

## 2023-01-22 DIAGNOSIS — I255 Ischemic cardiomyopathy: Secondary | ICD-10-CM

## 2023-01-22 LAB — CUP PACEART REMOTE DEVICE CHECK
Battery Remaining Longevity: 139 mo
Battery Voltage: 3.09 V
Brady Statistic AP VP Percent: 0.59 %
Brady Statistic AP VS Percent: 0.01 %
Brady Statistic AS VP Percent: 98.69 %
Brady Statistic AS VS Percent: 0.7 %
Brady Statistic RA Percent Paced: 0.79 %
Brady Statistic RV Percent Paced: 0 %
Date Time Interrogation Session: 20240730040537
HighPow Impedance: 66 Ohm
Implantable Lead Connection Status: 753985
Implantable Lead Connection Status: 753985
Implantable Lead Connection Status: 753985
Implantable Lead Implant Date: 20240429
Implantable Lead Implant Date: 20240429
Implantable Lead Implant Date: 20240429
Implantable Lead Location: 753859
Implantable Lead Location: 753860
Implantable Lead Location: 753860
Implantable Lead Model: 3830
Implantable Lead Model: 5076
Implantable Pulse Generator Implant Date: 20240429
Lead Channel Impedance Value: 266 Ohm
Lead Channel Impedance Value: 304 Ohm
Lead Channel Impedance Value: 399 Ohm
Lead Channel Impedance Value: 418 Ohm
Lead Channel Impedance Value: 494 Ohm
Lead Channel Impedance Value: 646 Ohm
Lead Channel Pacing Threshold Amplitude: 0.375 V
Lead Channel Pacing Threshold Pulse Width: 0.4 ms
Lead Channel Sensing Intrinsic Amplitude: 1.4 mV
Lead Channel Sensing Intrinsic Amplitude: 9.1 mV
Lead Channel Setting Pacing Amplitude: 1 V
Lead Channel Setting Pacing Amplitude: 3.5 V
Lead Channel Setting Pacing Pulse Width: 0.4 ms
Lead Channel Setting Sensing Sensitivity: 0.3 mV
Zone Setting Status: 755011
Zone Setting Status: 755011
Zone Setting Status: 755011

## 2023-01-24 ENCOUNTER — Telehealth: Payer: Self-pay | Admitting: Cardiology

## 2023-01-24 ENCOUNTER — Encounter (HOSPITAL_COMMUNITY): Payer: Self-pay | Admitting: Internal Medicine

## 2023-01-24 DIAGNOSIS — I5022 Chronic systolic (congestive) heart failure: Secondary | ICD-10-CM

## 2023-01-24 NOTE — Telephone Encounter (Signed)
Pt c/o medication issue:  1. Name of Medication:   sacubitril-valsartan (ENTRESTO) 24-26 MG   2. How are you currently taking this medication (dosage and times per day)? Daughter states patient is only taking 1/2 a tablet as it is making her dizzy  3. Are you having a reaction (difficulty breathing--STAT)?  Dizzy  4. What is your medication issue?   Daughter stated patient has not been taking this medication as prescribed and wants to know next steps.  Daughter wants call back to patient directly at [phone] .

## 2023-01-24 NOTE — Telephone Encounter (Signed)
Called patient directly since her daughter is not on DPR form. Patient stated she has already communicated through mychart with Dr. Prescott Gum nurse. Will close out phone note. Patient stated she will contact her daughter.

## 2023-01-25 NOTE — Telephone Encounter (Signed)
Received voicemail from patients daughter who states that patient is having issues with entresto and dizziness.   Spoke with patient who reports that she sent message and got a response back. Patient states that she is cutting her entresto in half and reports that she feels better on this dosage. Still reports some slight dizziness but better. Advised patient to maintain adequate hydration and to let us know if any issues continue or worsen. Patient verbalized understanding. She wants to make Dr. Gala Romney aware as well. Will forward message over. Patient aware of all instructions and verbalized understanding.

## 2023-01-29 ENCOUNTER — Ambulatory Visit: Payer: Medicare Other | Attending: Cardiovascular Disease | Admitting: Cardiovascular Disease

## 2023-01-29 ENCOUNTER — Encounter: Payer: Self-pay | Admitting: Cardiovascular Disease

## 2023-01-29 VITALS — BP 142/68 | HR 76 | Ht 66.0 in | Wt 129.2 lb

## 2023-01-29 DIAGNOSIS — I447 Left bundle-branch block, unspecified: Secondary | ICD-10-CM

## 2023-01-29 NOTE — Patient Instructions (Signed)
Medication Instructions:  Your physician recommends that you continue on your current medications as directed. Please refer to the Current Medication list given to you today. *If you need a refill on your cardiac medications before your next appointment, please call your pharmacy*   Follow-Up: At [facility], you and your health needs are our priority.  As part of our continuing mission to provide you with exceptional heart care, we have created designated Provider Care Teams.  These Care Teams include your primary Cardiologist (physician) and Advanced Practice Providers (APPs -  Physician Assistants and Nurse Practitioners) who all work together to provide you with the care you need, when you need it.  We recommend signing up for the patient portal called "MyChart".  Sign up information is provided on this After Visit Summary.  MyChart is used to connect with patients for Virtual Visits (Telemedicine).  Patients are able to view lab/test results, encounter notes, upcoming appointments, etc.  Non-urgent messages can be sent to your provider as well.   To learn more about what you can do with MyChart, go to [email or web address].    Your next appointment:   6 month(s)  Provider:   You will see one of the following Advanced Practice Providers on your designated Care Team:   Francis Dowse, Charlott Holler [address] [place], [place] Sherie Don, NP Canary Brim, NP

## 2023-01-29 NOTE — Progress Notes (Signed)
Electrophysiology Office Note:    Date:  01/29/2023   ID:  Claudia Howell, DOB 31-Jul-1940, MRN [id number]  PCP:  Joselyn Arrow, MD   [facility] Providers Cardiologist:  [name], MD (Inactive)     Referring MD: Joselyn Arrow, MD   History of Present Illness:    Claudia Howell is a 82 y.o. female with a hx listed below, significant for CAD s/p CABG x3 in 2014 with MAZE, PCI to SVG in 2014, NSTEMI in 04/2019 treated with PCI to RCA, HTN, HLD, pAFib, and chronic combined systolic and diastolic HF , referred for arrhythmia management.    She has a history of myocardial infarction and underwent coronary bypass in 2014.  Maze procedure and left atrial appendage clip was performed at this time.  Echocardiogram in October 2023 showed severely reduced ejection fraction, 20 to 25%.  She was placed on GDMT including Entresto and carvedilol, for which she was taking for 3 months prior to repeat echocardiogram performed in February 2024.  The repeat echo showed persistently decreased EF at 20 to 25%.  The patient reports that she has been feeling much better while taking the medication but continued to have some shortness of breath and fatigue.  BiV ICD was implanted on October 22, 2022 for primary prevention of sudden cardiac death and resynchronization of a wide intraventricular conduction delay.  We initially attempted a LV lead, but all branches were quite small.  We obtained a good result with a left bundle branch lead. QRS duration improved by about 20 msec.  In follow-up, she noted increased energy.          EKGs/Labs/Other Studies Reviewed Today:    Echocardiogram:  TTE 08/21/22 EF 30-35% global hypokinesis  TTE 04/11/22 EF 20-25%  Monitors:  Advanced imaging:   EKG:  Last EKG results: today - sinus rhythm, non-specific intraventricular conduction delay.   Recent Labs: 05/23/2022: ALT 7 10/18/2022: BUN 23; Creatinine, Ser 1.08; Hemoglobin 13.0; Platelets 147;  Potassium 4.1; Sodium 140     Physical Exam:    VS:  BP (!) 142/68   Pulse 76   Ht 5\' 6"  (1.676 m)   Wt 129 lb 3.2 oz (58.6 kg)   SpO2 97%   BMI 20.85 kg/m     Wt Readings from Last 3 Encounters:  01/29/23 129 lb 3.2 oz (58.6 kg)  11/20/22 128 lb 12.8 oz (58.4 kg)  10/22/22 130 lb (59 kg)     GEN: Well nourished, well developed in no acute distress CARDIAC: RRR, no murmurs, rubs, gallops RESPIRATORY:  Normal work of breathing MUSCULOSKELETAL: no edema    ASSESSMENT & PLAN:    Chronic congestive heart failure, ischemic cardiomyopathy NYHA II Appears compensated and euvolemic today Continue carvedilol 6.25 mg, dapagliflozin 10 mg, sacubitril valsartan 49-51, spironolactone 12.5 mg  Nonspecific intraventricular conduction delay QRS is about 160 ms S/p CRT placement with a LBB lead  Atrial fibrillation Status post Maze procedure with no evidence of recurrence Will continue to monitor with her device Not on anticoagulation -- s/p atrial clip        Medication Adjustments/Labs and Tests Ordered: Current medicines are reviewed at length with the patient today.  Concerns regarding medicines are outlined above.  Orders Placed This Encounter  Procedures   EKG 12-Lead   No orders of the defined types were placed in this encounter.    Signed, Maurice Small, MD  01/29/2023 2:19 PM    [facility]

## 2023-01-31 ENCOUNTER — Encounter (HOSPITAL_COMMUNITY): Payer: Self-pay | Admitting: Internal Medicine

## 2023-02-01 ENCOUNTER — Ambulatory Visit (HOSPITAL_COMMUNITY)
Admission: RE | Admit: 2023-02-01 | Discharge: 2023-02-01 | Disposition: A | Discharge status: Home / Self Care | Payer: Medicare Other | Source: Ambulatory Visit | Attending: Internal Medicine | Admitting: Internal Medicine

## 2023-02-01 DIAGNOSIS — I5022 Chronic systolic (congestive) heart failure: Secondary | ICD-10-CM

## 2023-02-01 LAB — BASIC METABOLIC PANEL
Anion gap: 6 (ref 5–15)
BUN: 17 mg/dL (ref 8–23)
CO2: 28 mmol/L (ref 22–32)
Calcium: 9 mg/dL (ref 8.9–10.3)
Chloride: 104 mmol/L (ref 98–111)
Creatinine, Ser: 1 mg/dL (ref 0.44–1.00)
GFR, Estimated: 57 mL/min — ABNORMAL LOW (ref >60)
Glucose, Bld: 112 mg/dL — ABNORMAL HIGH (ref 70–99)
Potassium: 4 mmol/L (ref 3.5–5.1)
Sodium: 138 mmol/L (ref 135–145)

## 2023-02-01 LAB — BRAIN NATRIURETIC PEPTIDE: B Natriuretic Peptide: 343.1 pg/mL — ABNORMAL HIGH (ref 0.0–100.0)

## 2023-02-13 NOTE — Progress Notes (Signed)
Remote ICD transmission.   

## 2023-02-19 ENCOUNTER — Ambulatory Visit (HOSPITAL_COMMUNITY): Admission: RE | Admit: 2023-02-19 | Payer: Medicare Other | Source: Ambulatory Visit

## 2023-02-19 ENCOUNTER — Encounter (HOSPITAL_COMMUNITY): Payer: Self-pay | Admitting: Internal Medicine

## 2023-02-19 ENCOUNTER — Ambulatory Visit (HOSPITAL_BASED_OUTPATIENT_CLINIC_OR_DEPARTMENT_OTHER)
Admission: RE | Admit: 2023-02-19 | Discharge: 2023-02-19 | Disposition: A | Discharge status: Home / Self Care | Payer: Medicare Other | Source: Ambulatory Visit | Attending: Internal Medicine | Admitting: Internal Medicine

## 2023-02-19 DIAGNOSIS — I480 Paroxysmal atrial fibrillation: Secondary | ICD-10-CM | POA: Diagnosis not present

## 2023-02-19 DIAGNOSIS — Z951 Presence of aortocoronary bypass graft: Secondary | ICD-10-CM | POA: Diagnosis not present

## 2023-02-19 DIAGNOSIS — I255 Ischemic cardiomyopathy: Secondary | ICD-10-CM | POA: Insufficient documentation

## 2023-02-19 DIAGNOSIS — I110 Hypertensive heart disease with heart failure: Secondary | ICD-10-CM | POA: Diagnosis not present

## 2023-02-19 DIAGNOSIS — I509 Heart failure, unspecified: Secondary | ICD-10-CM | POA: Diagnosis not present

## 2023-02-19 DIAGNOSIS — I5022 Chronic systolic (congestive) heart failure: Secondary | ICD-10-CM | POA: Diagnosis not present

## 2023-02-19 DIAGNOSIS — I2510 Atherosclerotic heart disease of native coronary artery without angina pectoris: Secondary | ICD-10-CM

## 2023-02-19 DIAGNOSIS — I252 Old myocardial infarction: Secondary | ICD-10-CM | POA: Diagnosis not present

## 2023-02-19 DIAGNOSIS — E785 Hyperlipidemia, unspecified: Secondary | ICD-10-CM | POA: Insufficient documentation

## 2023-02-19 DIAGNOSIS — I4891 Unspecified atrial fibrillation: Secondary | ICD-10-CM | POA: Diagnosis not present

## 2023-02-19 DIAGNOSIS — I447 Left bundle-branch block, unspecified: Secondary | ICD-10-CM

## 2023-02-19 LAB — ECHOCARDIOGRAM COMPLETE
AR max vel: 2.06 cm2
AV Area VTI: 2.06 cm2
AV Area mean vel: 2.01 cm2
AV Mean grad: 2 mmHg
AV Peak grad: 3.3 mmHg
Ao pk vel: 0.91 m/s
Area-P 1/2: 5.02 cm2
Calc EF: 29.9 %
MV VTI: 1.57 cm2
S' Lateral: 4.15 cm
Single Plane A2C EF: 41.1 %
Single Plane A4C EF: 15.8 %

## 2023-02-19 NOTE — Progress Notes (Addendum)
[facility]  NOTE  Referring Physician: Joselyn Arrow, MD Primary Care: Joselyn Arrow, MD Primary Cardiologist: Dr. Shari Prows  HPI:  Claudia Howell is a 82 y.o. 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 Shari Prows for further evalaution of her HF.    Last cath 04/2022 with patent LIMA to LAD, patent RCA with patent RCA stents, patent Lcx stent. Stable anatomy since 2020. RHC with EF <25%, severe pulmonary HTN with PASP , PVR 3.49 WU, PCWP . TTE 03/2022 with EF 20-25%, G3DD, akinesis of the basal-mid inferior, distal lateral apical walls, hypokinesis elsewhere, G3DD, normal RV, mild MR.  She was previously followed by Dr. Katrinka Blazing and recently has seen Dr. Shari Prows.   I saw her for first time in 4/24 and had end-stage HF symptoms. Entresto and carvedilol cut back. Underwent CRT-D 4/24   Here for f/u:   Echo today 02/19/23:   Feels a bit better after CRT and decrease in meds Now taking 1.5 days to clean the house instead of 3 days. Can do ADLs slowly. No edema, orthopnea or PND  BP at home 128/60 No low BPs   Past Medical History:  Diagnosis Date   Adenomatous colon polyp 03/2008   CHF (congestive heart failure) (HCC)    Constipation    Coronary artery disease    Last cath 12/14 Cath 12/8 100% SVG occlusion to RCA, 95% SVG.  The stenosis to OM.  LIMA to LAD patent but diffuse disease in LAD after graft.  Overlapping stents mid/distal SVG to OM.   Decubitus ulcer of coccyx 10/22/2012   1/2 inch raw open area on coccyx   Hyperlipidemia    Hypertension    Dr.Henry Katrinka Blazing   Myocardial infarction [facility])    Osteopenia 02/2008   PAF (paroxysmal atrial fibrillation) (HCC)    during cath/notes 10/16/2012   Right posterior capsular opacification 11/16/2019   S/P CABG x 3 10/22/2012   LIMA to  LAD, SVG to distal RCA, SVG to OM, EVH from right thigh   S/P Maze operation for atrial fibrillation 10/22/2012   Left side lesion set using bipolar radiofrequency and cryothermy ablation with clipping of LA appendage   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 90 tablet 3   carvedilol (COREG) 3.125 MG tablet Take 2 tablets (6.25 mg total) by mouth 2 (two) times daily. 180 tablet 3   cholecalciferol (VITAMIN D) 1000 UNITS tablet Take 1,000 Units by mouth in the morning.     clopidogrel (PLAVIX) 75 MG tablet Take 1 tablet (75 mg total) by mouth daily. 90 tablet 3   Coenzyme Q10 (COQ10 PO) Take 1 capsule by mouth in the morning.     dapagliflozin propanediol (FARXIGA) 10 MG TABS tablet Take 1 tablet (10 mg total) by mouth daily before breakfast. 90 tablet 3   digoxin (LANOXIN) 0.125 MG tablet Take 0.5 tablets (0.0625 mg total) by mouth daily. 45 tablet 3   diphenhydramine-acetaminophen (TYLENOL PM) 25-500 MG TABS tablet Take 1.5 tablets by mouth at bedtime as needed (sleep).     furosemide (LASIX) 20 MG tablet Take 1 tablet (20 mg total)  by mouth 3 (three) times a week. Take on Mondays, Wednesdays, and Fridays. (Patient taking differently: Take 20 mg by mouth every other day.)     MAGNESIUM PO Take 1 tablet by mouth in the morning.     Multiple Vitamin (MULTIVITAMIN) tablet Take 1 tablet by mouth in the morning.     nitroGLYCERIN (NITROSTAT) 0.4 MG SL tablet PLACE AND DISSOLVE 1 TABLET UNDER THE TONGUE EVERY 5 MINUTE AS NEEDED FOR CHEST PAIN 25 tablet 7   polyethylene glycol (MIRALAX / GLYCOLAX) 17 g packet Take 17 g by mouth in the morning.     Potassium 99 MG TABS Take 3 tablets by mouth in the morning.     sacubitril-valsartan (ENTRESTO) 24-26 MG Take 1 tablet by mouth 2 (two) times daily. 60 tablet 11   spironolactone (ALDACTONE) 25 MG  tablet Take 0.5 tablets (12.5 mg total) by mouth daily. 45 tablet 3   Current Facility-Administered Medications  Medication Dose Route Frequency Provider Last Rate Last Admin   sodium chloride flush (NS) 0.9 % injection 3 mL  3 mL Intravenous Q12H Gaston Islam., NP        Allergies  Allergen Reactions   Contrast Media [Iodinated Contrast Media] Hives   Pravastatin Other (See Comments)    muscle aches   Simvastatin Other (See Comments)    Muscle cramps   Tramadol Hcl Nausea And Vomiting   Zetia [Ezetimibe] Other (See Comments)    Muscle cramps   Oxycodone Nausea And Vomiting and Other (See Comments)    Extreme nausea and vomiting   Repatha [Evolocumab] Other (See Comments)    Muscle cramps      Social History   Socioeconomic History   Marital status: Widowed    Spouse name: Not on file   Number of children: 2   Years of education: 21   Highest education level: Associate degree: occupational, Scientist, product/process development, or vocational program  Occupational History   Occupation: retired    Comment: LPN  Tobacco Use   Smoking status: Never   Smokeless tobacco: Never  Vaping Use   Vaping status: Never Used  Substance and Sexual Activity   Alcohol use: Yes    Alcohol/week: 2.0 standard drinks of alcohol    Types: 2 Glasses of wine per week    Comment: 2/month   Drug use: No   Sexual activity: Not Currently  Other Topics Concern   Not on file  Social History Narrative   Widowed.  Lives alone.  Retired Public house manager.  1 son in [place], 1 son in [place]. 7 grandchildren, 2 great granddaughters, 1 great grandson (in [place]), planning to adopt a child from [place] in spring 2024.      Updated 04/2022   Social Determinants of Health   Financial Resource Strain: Low Risk  (05/26/2019)   Overall Financial Resource Strain (CARDIA)    Difficulty of Paying Living Expenses: Not hard at all  Food Insecurity: No Food Insecurity (10/09/2022)   Hunger Vital Sign    Worried About Running Out of Food in the Last  Year: Never true    Ran Out of Food in the Last Year: Never true  Transportation Needs: No Transportation Needs (10/09/2022)   PRAPARE - Administrator, Civil Service (Medical): No    Lack of Transportation (Non-Medical): No  Physical Activity: Insufficiently Active (05/26/2019)   Exercise Vital Sign    Days of Exercise per Week: 5 days    Minutes of Exercise per Session: 20  min  Stress: No Stress Concern Present (05/26/2019)   [employer] of Occupational Health - Occupational Stress Questionnaire    Feeling of Stress : Not at all  Social Connections: Unknown (05/26/2019)   Social Connection and Isolation Panel [NHANES]    Frequency of Communication with Friends and Family: Not asked    Frequency of Social Gatherings with Friends and Family: Not asked    Attends Religious Services: Not on file    Active Member of Clubs or Organizations: Not on file    Attends Banker Meetings: Not asked    Marital Status: Widowed  Catering manager Violence: Not on file      Family History  Problem Relation Age of Onset   Hypertension Mother    Heart attack Mother 45   Hypertension Brother    Cancer Brother        prostate (76), gall bladder (dx 78)   Heart disease Brother 31       stent   Prostate cancer Brother    Heart disease Other        x2 (age 30 and 58)   Diabetes Neg Hx    Breast cancer Neg Hx    Colon cancer Neg Hx    Stomach cancer Neg Hx    Rectal cancer Neg Hx    Liver cancer Neg Hx    Pancreatic cancer Neg Hx    Esophageal cancer Neg Hx     Vitals:   02/19/23 1404  BP: (!) 140/88  Pulse: 85  SpO2: 97%  Weight: 59.4 kg (131 lb)     PHYSICAL EXAM: General:  Well appearing. No resp difficulty HEENT: normal Neck: supple. no JVD. Carotids 2+ bilat; no bruits. No lymphadenopathy or thryomegaly appreciated. Cor: PMI nondisplaced. Regular rate & rhythm. No rubs, gallops or murmurs. Lungs: clear Abdomen: soft, nontender, nondistended. No  hepatosplenomegaly. No bruits or masses. Good bowel sounds. Extremities: no cyanosis, clubbing, rash, edema Neuro: alert & orientedx3, cranial nerves grossly intact. moves all 4 extremities w/o difficulty. Affect pleasant  ASSESSMENT & PLAN:  1. Chronic Combined Systolic and Diastolic HF: - due to iCM - Echo 10/23 wEF 20-25% with akinesis of the basal to mid septum, basal to mid inferior, distal lateral and apical walls. G3DD, mild MR - Myoview 1/23 EF 23%  - Echo 2/24 Ef 20-25% - s/p CTR-D 4/24 - Echo today 02/19/23: EF 25-30% Personally reviewed - Volume status ok Continue lasix 20mg  M,W, F - Continue carvedilol to 3.125 mg BID (unable to tolerate higher doses due to fatigue and low BP) - Continue farxiga 10mg  daily - Continue entresto 24/26mg  BID (unable to tolerate higher doses due to fatigue and low BP) - Continue spironolactone 12.5mg  daily - Continue digoxin 0.0625mg  daily - She has advanced HF with NYHA IIIB symptoms despite excellent GDMT and well-controlled volume status. Now s/p CRT. We discussed other advanced options such as Barostim and possible VAD but given age likely not VAD candidate (and she doesn't want). We will continue to follow closely. Med changes as above.   2. CAD s/p CABG with subsequent PCI: - s/p CABG 2014 (Dr. Cornelius Moras)  - LHC 04/2022 with patent LIMA to LAD, patent RCA stents, patent Lcx stent. LVEF severely depressed 20-25% as detailed above. - No s/s angina -Continue lipitor 10mg  daily -Continue plavix 75mg  daily  3.Paroxysmal Afib: - S/p MAZE with no known recurrence. Not on AC.   4. LBBB - s/p CRT-D in 4/24  Total time spent 40  minutes. Over half that time spent discussing above.    Arvilla Meres, MD  2:39 PM

## 2023-02-19 NOTE — Patient Instructions (Signed)
Good to see you today!  No medication changes  Your physician recommends that you schedule a follow-up appointment in: 4 months as scheduled  If you have any questions or concerns before your next appointment please send [place] a message through [place] or call our office at [phone].    TO LEAVE A MESSAGE FOR THE NURSE SELECT OPTION 2, PLEASE LEAVE A MESSAGE INCLUDING: YOUR NAME DATE OF BIRTH CALL BACK NUMBER REASON FOR CALL**this is important as we prioritize the call backs  YOU WILL RECEIVE A CALL BACK THE SAME DAY AS LONG AS YOU CALL BEFORE 4:00 PM  At the [facility], you and your health needs are our priority. As part of our continuing mission to provide you with exceptional heart care, we have created designated Provider Care Teams. These Care Teams include your primary Cardiologist (physician) and Advanced Practice Providers (APPs- Physician Assistants and Nurse Practitioners) who all work together to provide you with the care you need, when you need it.   You may see any of the following providers on your designated Care Team at your next follow up: Dr Arvilla Meres Dr Marca Ancona Dr. Marcos Eke, NP Robbie Lis, [place] [facility] [place], [place] Brynda Peon, NP Karle Plumber, PharmD   Please be sure to bring in all your medications bottles to every appointment.    Thank you for choosing [facility]

## 2023-03-07 ENCOUNTER — Encounter: Payer: Self-pay | Admitting: Internal Medicine

## 2023-03-29 ENCOUNTER — Other Ambulatory Visit: Payer: Self-pay

## 2023-03-29 MED ORDER — ATORVASTATIN CALCIUM 10 MG PO TABS: 10.0000 mg | ORAL_TABLET | Freq: Every day | ORAL | 2 refills | Status: DC

## 2023-04-02 ENCOUNTER — Other Ambulatory Visit (INDEPENDENT_AMBULATORY_CARE_PROVIDER_SITE_OTHER): Payer: Medicare Other

## 2023-04-02 DIAGNOSIS — Z23 Encounter for immunization: Secondary | ICD-10-CM | POA: Diagnosis not present

## 2023-04-09 ENCOUNTER — Telehealth (HOSPITAL_COMMUNITY): Payer: Self-pay | Admitting: Cardiology

## 2023-04-09 MED ORDER — DAPAGLIFLOZIN PROPANEDIOL 10 MG PO TABS: 10.0000 mg | ORAL_TABLET | Freq: Every day | ORAL | 3 refills | Status: DC

## 2023-04-09 NOTE — Telephone Encounter (Signed)
Farxiga PAP med Rx needed   Script sent

## 2023-04-10 ENCOUNTER — Encounter (HOSPITAL_COMMUNITY): Payer: Self-pay | Admitting: Internal Medicine

## 2023-04-15 ENCOUNTER — Other Ambulatory Visit (HOSPITAL_COMMUNITY): Payer: Self-pay | Admitting: Internal Medicine

## 2023-04-16 ENCOUNTER — Other Ambulatory Visit (INDEPENDENT_AMBULATORY_CARE_PROVIDER_SITE_OTHER): Payer: Medicare Other

## 2023-04-16 DIAGNOSIS — Z23 Encounter for immunization: Secondary | ICD-10-CM | POA: Diagnosis not present

## 2023-04-22 ENCOUNTER — Encounter: Payer: Self-pay | Admitting: Cardiovascular Disease

## 2023-04-23 ENCOUNTER — Ambulatory Visit (INDEPENDENT_AMBULATORY_CARE_PROVIDER_SITE_OTHER): Payer: Medicare Other

## 2023-04-23 DIAGNOSIS — I255 Ischemic cardiomyopathy: Secondary | ICD-10-CM

## 2023-04-23 DIAGNOSIS — I447 Left bundle-branch block, unspecified: Secondary | ICD-10-CM

## 2023-04-25 LAB — CUP PACEART REMOTE DEVICE CHECK
Battery Remaining Longevity: 135 mo
Battery Voltage: 3.04 V
Brady Statistic AP VP Percent: 0.57 %
Brady Statistic AP VS Percent: 0.01 %
Brady Statistic AS VP Percent: 98.61 %
Brady Statistic AS VS Percent: 0.81 %
Brady Statistic RA Percent Paced: 0.74 %
Brady Statistic RV Percent Paced: 0 %
Date Time Interrogation Session: 20241029034539
HighPow Impedance: 64 Ohm
Implantable Lead Connection Status: 753985
Implantable Lead Connection Status: 753985
Implantable Lead Connection Status: 753985
Implantable Lead Implant Date: 20240429
Implantable Lead Implant Date: 20240429
Implantable Lead Implant Date: 20240429
Implantable Lead Location: 753859
Implantable Lead Location: 753860
Implantable Lead Location: 753860
Implantable Lead Model: 3830
Implantable Lead Model: 5076
Implantable Pulse Generator Implant Date: 20240429
Lead Channel Impedance Value: 285 Ohm
Lead Channel Impedance Value: 323 Ohm
Lead Channel Impedance Value: 399 Ohm
Lead Channel Impedance Value: 437 Ohm
Lead Channel Impedance Value: 494 Ohm
Lead Channel Impedance Value: 646 Ohm
Lead Channel Pacing Threshold Amplitude: 0.375 V
Lead Channel Pacing Threshold Pulse Width: 0.4 ms
Lead Channel Sensing Intrinsic Amplitude: 1.1 mV
Lead Channel Sensing Intrinsic Amplitude: 7.1 mV
Lead Channel Setting Pacing Amplitude: 1 V
Lead Channel Setting Pacing Amplitude: 3.5 V
Lead Channel Setting Pacing Pulse Width: 0.4 ms
Lead Channel Setting Sensing Sensitivity: 0.3 mV
Zone Setting Status: 755011
Zone Setting Status: 755011
Zone Setting Status: 755011

## 2023-04-30 ENCOUNTER — Encounter: Payer: Self-pay | Admitting: Cardiovascular Disease

## 2023-05-06 ENCOUNTER — Other Ambulatory Visit (HOSPITAL_COMMUNITY): Payer: Self-pay

## 2023-05-08 ENCOUNTER — Telehealth (HOSPITAL_COMMUNITY): Payer: Self-pay

## 2023-05-08 NOTE — Telephone Encounter (Signed)
Advanced Heart Failure Patient Advocate Encounter  Received notification that pt is due to renew [place] assistance with [employer]. Review of patient chart indicates that patient may be eligible for a [employer] grant that would cover the cost of Carvedilol, Entresto, Farxiga, Digoxin, Spironolactone.   Spoke to patient by phone and she is interested in applying for the grant. Pt currently has Entresto assistance until 12/31, and will not need a refill of Farxiga until mid December. She will call me when she is ready to refill Marcelline Deist so that I can obtain the grant and confirm that the pharmacy has the processing information.  Burnell Blanks, CPhT Rx Patient Advocate Phone: [phone]

## 2023-05-14 NOTE — Progress Notes (Signed)
Remote ICD transmission.   

## 2023-05-28 NOTE — Patient Instructions (Incomplete)
HEALTH MAINTENANCE RECOMMENDATIONS:  It is recommended that you get at least 30 minutes of aerobic exercise at least 5 days/week (for weight loss, you may need as much as 60-90 minutes). This can be any activity that gets your heart rate up. This can be divided in 10-15 minute intervals if needed, but try and build up your endurance at least once a week.  Weight bearing exercise is also recommended twice weekly.  Eat a healthy diet with lots of vegetables, fruits and fiber.  "Colorful" foods have a lot of vitamins (ie green vegetables, tomatoes, red peppers, etc).  Limit sweet tea, regular sodas and alcoholic beverages, all of which has a lot of calories and sugar.  Up to 1 alcoholic drink daily may be beneficial for women (unless trying to lose weight, watch sugars).  Drink a lot of water.  Calcium recommendations are 1200-1500 mg daily (1500 mg for postmenopausal women or women without ovaries), and vitamin D 1000 IU daily.  This should be obtained from diet and/or supplements (vitamins), and calcium should not be taken all at once, but in divided doses.  Monthly self breast exams and yearly mammograms for women over the age of 66 is recommended.  Sunscreen of at least SPF 30 should be used on all sun-exposed parts of the skin when outside between the hours of 10 am and 4 pm (not just when at beach or pool, but even with exercise, golf, tennis, and yard work!)  Use a sunscreen that says "broad spectrum" so it covers both UVA and UVB rays, and make sure to reapply every 1-2 hours.  Remember to change the batteries in your smoke detectors when changing your clock times in the spring and fall. Carbon monoxide detectors are recommended for your home.  Use your seat belt every time you are in a car, and please drive safely and not be distracted with cell phones and texting while driving.   Claudia Howell , Thank you for taking time to come for your Medicare Wellness Visit. I appreciate your ongoing  commitment to your health goals. Please review the following plan we discussed and let me know if I can assist you in the future.   This is a list of the screening recommended for you and due dates:  Health Maintenance  Topic Date Due   Mammogram  11/17/2021   COVID-19 Vaccine (7 - 2023-24 season) 06/11/2023   Medicare Annual Wellness Visit  05/28/2024   DTaP/Tdap/Td vaccine (5 - Td or Tdap) 07/28/2030   Pneumonia Vaccine  Completed   Flu Shot  Completed   DEXA scan (bone density measurement)  Completed   Zoster (Shingles) Vaccine  Completed   HPV Vaccine  Aged Out   Colon Cancer Screening  Discontinued   Hepatitis C Screening  Discontinued   Please contact [facility] to schedule your mammogram and bone density test.  I recommend getting RSV vaccine from the pharmacy.   You should wait 1-2 weeks from today's vaccination.  Check with Dr. Prescott Gum office to see if you would be a candidate for cardiac rehab.  This might help you get stronger in your walking and muscle mass.  Consider following up with Dr. Charlsie Merles if you have ongoing pain in the ankle, to see what other types of treatments might be available, ?any benefit from physical therapy.  Schedule a visit here for treatment of the rough spots on your right arm, vs seeing a dermatologist for a full skin check.   You  might have a mild cystocele (your bladder moved down some with straining, not far enough out to be treated surgically or with a pessary. Try and do some pelvic floor exercises, and keep your bowels as soft as possible (straining makes it worse, bulging more). We can refer you to a urogynecologist or for pelvic floor physical therapy if this is getting worse. I wasn't able to assess for a rectocele, due to the tight nature of your anal sphincter today (I couldn't perform the rectal exam).

## 2023-05-28 NOTE — Progress Notes (Unsigned)
No chief complaint on file.   Claudia Howell is a 82 y.o. female who presents for annual wellness visit and follow-up on chronic medical conditions.    CAD and CHF:  She has history of CAD with bypass graft (January/2014), saphenous vein graft stent (10/2012), and unstable angina treated 04/28/2019 with R coronary artery CTO PCI.  She continues on plavix and lipitor. She also has hypertension, h/o paroxysmal atrial fibrillation, s/p Maze procedure. Denies any recurrent atrial fibrillation. Denies chest pain, palpitations, tachycardia, edema.  She is now under the care of Dr. Gala Romney for CHF, last seen in August.  She underwent CRT in 09/2022, biventricular ICD placed. Last echo 01/2023, EF 25-30%. Doing well on farxiga, Lasix 20 mg M/W/F, carvedilol 3.125 mg BID, Entresto (24/26mg  BID), spironolactone 12.5mg  daily and digoxin 0.0625mg  daily.     Hyperlipidemia:  intolerance to zetia and limited tolerance to statins.  She is currently on atorvastatin 10mg . Lipids last checked by cardiology, at goal with LDL 70 (see below; goal LDL <70). She is taking Coenzyme Q10 daily and is tolerating this statin better than others. (She previously tried Repatha x 3 doses through the lipid clinic, but didn't tolerate due to cold symptoms.)  Lab Results  Component Value Date   CHOL 139 04/25/2022   HDL 40 04/25/2022   LDLCALC 70 04/25/2022   TRIG 172 (H) 04/25/2022   CHOLHDL 3.5 04/25/2022    Constipation: This has been controlled with using a capful of Miralax daily, taking Senna every night (has done this for over 50 years; tried cutting back but bowels were worse), and eating more vegetables.  She is moving her bowels every morning.    H/o adenomatous colon polyps: Colonoscopy in 10/2014 had tubular adenomas, rec 3 year repeat. This was delayed due to MI and being on aspirin and Plavix.  She had f/u colonoscopy in June 2023-- tubular adenomas were present, but due to age, stated that no routine  colonoscopy follow-up was needed. Melanosis coli was noted. She denies bowel changes or rectal bleeding.   Immunization History  Administered Date(s) Administered   Fluad Quad(high Dose 65+) 03/25/2019, 03/22/2020, 04/05/2021, 04/17/2022   Fluad Trivalent(High Dose 65+) 04/02/2023   Influenza Split 05/07/2012   Influenza, High Dose Seasonal PF 04/12/2011, 04/22/2013, 03/25/2014, 04/08/2015, 04/13/2016, 03/27/2017, 05/08/2018   Influenza-Unspecified 04/12/2011   PFIZER(Purple Top)SARS-COV-2 Vaccination 07/08/2019, 07/29/2019, 04/06/2020   Pfizer Covid-19 Vaccine Bivalent Booster 45yrs & up 04/19/2021   Pfizer(Comirnaty)Fall Seasonal Vaccine 12 years and older 03/12/2022, 04/16/2023   Pneumococcal Conjugate-13 09/09/2014   Pneumococcal Polysaccharide-23 06/30/2009, 05/09/2020   Td 06/25/2002   Td (Adult) 06/25/2002   Tdap 01/23/2009, 07/28/2020   Zoster Recombinant(Shingrix) 03/14/2017, 05/24/2017   Zoster, Live 01/24/2008   Last Pap smear: n/a (hysterectomy for benign reasons) Last mammogram: 10/2020 Last colonoscopy: 11/2021 with Dr. Lequita Halt tubular adenomas, mild melanosis coli, moderate sigmoid diverticulosis, non-bleeding internal hemorrhoids.  No f/u recommended based on age. Last DEXA: 10/2020 T-2.0 at L wrist (at [facility]).  Prior DEXA was in 09/2011 (T-1.1 L fem neck)--[facility]ist: twice yearly Ophtho: sees retinal specialist yearly  Exercise:  limited due to fatigue (related to CHF)  Vitamin D-OH level of 35 in 2014.   Patient Care Team: Joselyn Arrow, MD as PCP - General (Family Medicine) Lyn Records, MD (Inactive) as PCP - Cardiology (Cardiology) Mealor, Roberts Gaudy, MD as PCP - Electrophysiology (Cardiology) Gelene Mink, OD as Referring Physician (Optometry) Luciana Axe Alford Highland, MD as Consulting Physician (Ophthalmology) Donzetta Starch, MD as  Consulting Physician (Dermatology) Lenn Sink, DPM as Consulting Physician (Podiatry) Dr. Floria Raveling, DDS as  Consulting Physician (Dentistry) Lynann Bologna, MD as Consulting Physician (Gastroenterology)   Depression Screening: Flowsheet Row Office Visit from 05/23/2022 in [facility]  PHQ-2 Total Score 0        Falls screen:     05/23/2022    1:48 PM 05/11/2021    2:50 PM 10/20/2020    2:43 PM 05/09/2020    2:51 PM 05/26/2019   11:25 AM  Fall Risk   Falls in the past year? 0 0 0 0 0  Number falls in past yr: 0 0 0  0  Injury with Fall? 0 0 0  0  Risk for fall due to : No Fall Risks    Other (Comment)  Risk for fall due to: Comment     Single Leg Stand Test: 2.18 sec.  Follow up Falls evaluation completed  Falls evaluation completed  Falls evaluation completed     Functional Status Survey:          End of Life Discussion:  Patient has a living will and medical power of attorney   PMH, PSH, SH and FH were reviewed and updated     ROS: The patient denies anorexia, fever, headaches, ear pain, sore throat, breast concerns, palpitations, dizziness, syncope, dyspnea on exertion, cough, swelling, nausea, vomiting, diarrhea, constipation, abdominal pain, melena, hematochezia, indigestion/heartburn, hematuria, incontinence, dysuria, vaginal bleeding, discharge, odor or itch, genital lesions, joint pains, weakness, tremor, suspicious skin lesions, depression, anxiety, abnormal bleeding/bruising, or enlarged lymph nodes. Constipation is controlled per HPI Denies heartburn, indigestion. Fatigue   PHYSICAL EXAM:  There were no vitals taken for this visit.    Wt Readings from Last 3 Encounters:  02/19/23 131 lb (59.4 kg)  01/29/23 129 lb 3.2 oz (58.6 kg)  11/20/22 128 lb 12.8 oz (58.4 kg)    General Appearance:   Alert, cooperative, no distress, appears stated age    Head:   Normocephalic, without obvious abnormality, atraumatic    Eyes:   PERRL, conjunctiva/corneas clear, EOM's intact, fundi benign    Ears:   Normal TM's and external ear canals.   Nose:   No  drainage or sinus tenderness  Throat:   Normal mucosa  Neck:   Supple, no lymphadenopathy; thyroid: no enlargement/tenderness/ nodules; no carotid bruit appreciable  Back:   Spine nontender, no curvature, ROM normal, no CVA tenderness    Lungs:   Clear to auscultation bilaterally without wheezes, respirations unlabored. Trace crackles at left base  Chest Wall:   No tenderness or deformity. WHSS  Heart:   Regular rate and rhythm, S1 and S2 normal, no murmur, rub or gallop    Breast Exam:   No tenderness, masses, or nipple discharge or inversion. No axillary lymphadenopathy.  WHSS left breast at superior aspect of areola. There is a palpable defect at 12 o'clock where tissue was removed.  No masses.  Abdomen:   Soft, non-tender, nondistended, normoactive bowel sounds, no masses, no hepatosplenomegaly. Small umbilical hernia, nontender, easily reducible.  Genitalia:   Deferred (done last year, no complaints)  Rectal:   Deferred   Extremities:   No clubbing, cyanosis or edema.  There is a 4x 3 cm soft tissue mass/focal area of swelling at R lateral malleolus. Unchanged from last year. *** There is no erythema, warmth, nontender.   Pulses:   2+ and symmetric all extremities    Skin:   Skin color, texture, turgor  normal, no rashes or lesions. some diffuse thinning noted in scalp, no rash. Actinic changes upper back, legs, hands. No bruising  Lymph nodes:   Cervical, supraclavicular, and axillary nodes normal    Neurologic:   Normal strength, sensation and gait; reflexes 2+ and symmetric throughout                         Psych:   Normal mood, affect, hygiene and grooming  UPDATE EXAM ***DEFIBRILLATOR UPDATE R lateral ankle soft tissue mass Any bruising/purpura?   ASSESSMENT/PLAN:  Lipids need to be added TSH only if sx Prevnar-20 rec RSV from pharmacy rec  Can defer pelvic exam if no symptoms (done last year)  Past due for mammo and DEXA (ordered last year, was due in May, she never  scheduled--order was extended)  Discussed monthly self breast exams and yearly mammograms (past due, reminded to schedule); at least 30 minutes of aerobic activity at least 5 days/week and weight-bearing exercise 2x/week; proper sunscreen use reviewed; healthy diet, including goals of calcium and vitamin D intake and alcohol recommendations (less than or equal to 1 drink/day) reviewed; regular seatbelt use; changing batteries in smoke detectors, use of carbon monoxide detectors.  Immunization recommendations discussed--continue yearly high dose flu shots.  RSV vaccine recommended, to get from pharmacy. Prevnar-20 Colonoscopy recommendations reviewed, UTD. DEXA due 10/2022, ordered and reminded to schedule DEXA and mammo.  MOST form reviewed and unchanged She is Full Code, Full Care   Medicare Attestation I have personally reviewed: The patient's medical and social history Their use of alcohol, tobacco or illicit drugs Their current medications and supplements The patient's functional ability including ADLs,fall risks, home safety risks, cognitive, and hearing and visual impairment Diet and physical activities Evidence for depression or mood disorders  The patient's weight, height, BMI have been recorded in the chart.  I have made referrals, counseling, and provided education to the patient based on review of the above and I have provided the patient with a written personalized care plan for preventive services.     Lavonda Jumbo, MD

## 2023-05-29 ENCOUNTER — Ambulatory Visit (INDEPENDENT_AMBULATORY_CARE_PROVIDER_SITE_OTHER): Payer: Medicare Other | Admitting: Family Medicine

## 2023-05-29 ENCOUNTER — Encounter: Payer: Self-pay | Admitting: Family Medicine

## 2023-05-29 VITALS — BP 120/58 | HR 64 | Ht 64.0 in | Wt 128.8 lb

## 2023-05-29 DIAGNOSIS — I5022 Chronic systolic (congestive) heart failure: Secondary | ICD-10-CM

## 2023-05-29 DIAGNOSIS — E7800 Pure hypercholesterolemia, unspecified: Secondary | ICD-10-CM

## 2023-05-29 DIAGNOSIS — I25719 Atherosclerosis of autologous vein coronary artery bypass graft(s) with unspecified angina pectoris: Secondary | ICD-10-CM

## 2023-05-29 DIAGNOSIS — Z5181 Encounter for therapeutic drug level monitoring: Secondary | ICD-10-CM

## 2023-05-29 DIAGNOSIS — N8110 Cystocele, unspecified: Secondary | ICD-10-CM

## 2023-05-29 DIAGNOSIS — Z23 Encounter for immunization: Secondary | ICD-10-CM | POA: Diagnosis not present

## 2023-05-29 DIAGNOSIS — Z0000 Encounter for general adult medical examination without abnormal findings: Secondary | ICD-10-CM

## 2023-05-29 DIAGNOSIS — I10 Essential (primary) hypertension: Secondary | ICD-10-CM

## 2023-05-29 DIAGNOSIS — R0989 Other specified symptoms and signs involving the circulatory and respiratory systems: Secondary | ICD-10-CM | POA: Diagnosis not present

## 2023-05-29 DIAGNOSIS — I700 Atherosclerosis of aorta: Secondary | ICD-10-CM

## 2023-05-30 ENCOUNTER — Ambulatory Visit
Admission: RE | Admit: 2023-05-30 | Discharge: 2023-05-30 | Disposition: A | Discharge status: Home / Self Care | Payer: Medicare Other | Source: Ambulatory Visit | Attending: Family Medicine | Admitting: Family Medicine

## 2023-05-30 DIAGNOSIS — R0989 Other specified symptoms and signs involving the circulatory and respiratory systems: Secondary | ICD-10-CM | POA: Diagnosis not present

## 2023-06-04 ENCOUNTER — Other Ambulatory Visit: Payer: Medicare Other

## 2023-06-04 DIAGNOSIS — E7800 Pure hypercholesterolemia, unspecified: Secondary | ICD-10-CM | POA: Diagnosis not present

## 2023-06-04 DIAGNOSIS — Z5181 Encounter for therapeutic drug level monitoring: Secondary | ICD-10-CM | POA: Diagnosis not present

## 2023-06-04 DIAGNOSIS — I10 Essential (primary) hypertension: Secondary | ICD-10-CM

## 2023-06-04 LAB — LIPID PANEL

## 2023-06-05 LAB — CBC WITH DIFFERENTIAL/PLATELET
Basophils Absolute: 0.1 10*3/uL (ref 0.0–0.2)
Basos: 1 %
EOS (ABSOLUTE): 0.2 10*3/uL (ref 0.0–0.4)
Eos: 3 %
Hematocrit: 42.7 % (ref 34.0–46.6)
Hemoglobin: 13.6 g/dL (ref 11.1–15.9)
Immature Grans (Abs): 0 10*3/uL (ref 0.0–0.1)
Immature Granulocytes: 0 %
Lymphocytes Absolute: 1.4 10*3/uL (ref 0.7–3.1)
Lymphs: 19 %
MCH: 31.5 pg (ref 26.6–33.0)
MCHC: 31.9 g/dL (ref 31.5–35.7)
MCV: 99 fL — ABNORMAL HIGH (ref 79–97)
Monocytes Absolute: 0.7 10*3/uL (ref 0.1–0.9)
Monocytes: 10 %
Neutrophils Absolute: 4.8 10*3/uL (ref 1.4–7.0)
Neutrophils: 67 %
Platelets: 165 10*3/uL (ref 150–450)
RBC: 4.32 x10E6/uL (ref 3.77–5.28)
RDW: 12.2 % (ref 11.7–15.4)
WBC: 7.1 10*3/uL (ref 3.4–10.8)

## 2023-06-05 LAB — CMP14+EGFR
ALT: 12 [IU]/L (ref 0–32)
AST: 23 [IU]/L (ref 0–40)
Albumin: 4.3 g/dL (ref 3.8–4.8)
Alkaline Phosphatase: 66 [IU]/L (ref 44–121)
BUN/Creatinine Ratio: 13 (ref 12–28)
BUN: 14 mg/dL (ref 8–27)
Bilirubin Total: 0.5 mg/dL (ref 0.0–1.2)
CO2: 26 mmol/L (ref 20–29)
Calcium: 9.1 mg/dL (ref 8.7–10.3)
Chloride: 103 mmol/L (ref 96–106)
Creatinine, Ser: 1.07 mg/dL — ABNORMAL HIGH (ref 0.57–1.00)
Globulin, Total: 2.9 g/dL (ref 1.5–4.5)
Glucose: 100 mg/dL — ABNORMAL HIGH (ref 70–99)
Potassium: 4.6 mmol/L (ref 3.5–5.2)
Sodium: 141 mmol/L (ref 134–144)
Total Protein: 7.2 g/dL (ref 6.0–8.5)
eGFR: 53 mL/min/{1.73_m2} — ABNORMAL LOW (ref >59)

## 2023-06-05 LAB — LIPID PANEL
Cholesterol, Total: 149 mg/dL (ref 100–199)
HDL: 41 mg/dL (ref >39)
LDL CALC COMMENT:: 3.6 ratio (ref 0.0–4.4)
LDL Chol Calc (NIH): 87 mg/dL (ref 0–99)
Triglycerides: 114 mg/dL (ref 0–149)
VLDL Cholesterol Cal: 21 mg/dL (ref 5–40)

## 2023-06-10 ENCOUNTER — Ambulatory Visit (HOSPITAL_COMMUNITY)
Admission: RE | Admit: 2023-06-10 | Discharge: 2023-06-10 | Disposition: A | Discharge status: Home / Self Care | Payer: Medicare Other | Source: Ambulatory Visit | Attending: Internal Medicine | Admitting: Internal Medicine

## 2023-06-10 ENCOUNTER — Encounter (HOSPITAL_COMMUNITY): Payer: Self-pay | Admitting: Internal Medicine

## 2023-06-10 VITALS — BP 132/82 | HR 78 | Ht 66.0 in | Wt 129.2 lb

## 2023-06-10 DIAGNOSIS — Z951 Presence of aortocoronary bypass graft: Secondary | ICD-10-CM | POA: Diagnosis not present

## 2023-06-10 DIAGNOSIS — Z7902 Long term (current) use of antithrombotics/antiplatelets: Secondary | ICD-10-CM | POA: Insufficient documentation

## 2023-06-10 DIAGNOSIS — I959 Hypotension, unspecified: Secondary | ICD-10-CM | POA: Insufficient documentation

## 2023-06-10 DIAGNOSIS — I252 Old myocardial infarction: Secondary | ICD-10-CM | POA: Diagnosis not present

## 2023-06-10 DIAGNOSIS — Z4502 Encounter for adjustment and management of automatic implantable cardiac defibrillator: Secondary | ICD-10-CM | POA: Insufficient documentation

## 2023-06-10 DIAGNOSIS — I480 Paroxysmal atrial fibrillation: Secondary | ICD-10-CM | POA: Insufficient documentation

## 2023-06-10 DIAGNOSIS — I110 Hypertensive heart disease with heart failure: Secondary | ICD-10-CM | POA: Insufficient documentation

## 2023-06-10 DIAGNOSIS — I2510 Atherosclerotic heart disease of native coronary artery without angina pectoris: Secondary | ICD-10-CM | POA: Insufficient documentation

## 2023-06-10 DIAGNOSIS — Z79899 Other long term (current) drug therapy: Secondary | ICD-10-CM | POA: Diagnosis not present

## 2023-06-10 DIAGNOSIS — Z8679 Personal history of other diseases of the circulatory system: Secondary | ICD-10-CM | POA: Diagnosis not present

## 2023-06-10 DIAGNOSIS — I5042 Chronic combined systolic (congestive) and diastolic (congestive) heart failure: Secondary | ICD-10-CM | POA: Insufficient documentation

## 2023-06-10 DIAGNOSIS — Z9889 Other specified postprocedural states: Secondary | ICD-10-CM | POA: Diagnosis not present

## 2023-06-10 DIAGNOSIS — I5022 Chronic systolic (congestive) heart failure: Secondary | ICD-10-CM

## 2023-06-10 DIAGNOSIS — E785 Hyperlipidemia, unspecified: Secondary | ICD-10-CM | POA: Diagnosis not present

## 2023-06-10 DIAGNOSIS — I447 Left bundle-branch block, unspecified: Secondary | ICD-10-CM | POA: Insufficient documentation

## 2023-06-10 DIAGNOSIS — R5383 Other fatigue: Secondary | ICD-10-CM | POA: Insufficient documentation

## 2023-06-10 DIAGNOSIS — Z955 Presence of coronary angioplasty implant and graft: Secondary | ICD-10-CM | POA: Diagnosis not present

## 2023-06-10 DIAGNOSIS — Z95810 Presence of automatic (implantable) cardiac defibrillator: Secondary | ICD-10-CM | POA: Diagnosis not present

## 2023-06-10 NOTE — Patient Instructions (Addendum)
No Labs done today.   STOP taking Digoxin  No other medication changes were made. Please continue all current medications as prescribed.  Your physician recommends that you schedule a follow-up appointment in: 2-3 months with Dr. Gala Romney with an echo prior to your appointment.   Your physician has requested that you have an echocardiogram. Echocardiography is a painless test that uses sound waves to create images of your heart. It provides your doctor with information about the size and shape of your heart and how well your heart's chambers and valves are working. This procedure takes approximately one hour. There are no restrictions for this procedure. Please do NOT wear cologne, perfume, aftershave, or lotions (deodorant is allowed). Please arrive 15 minutes prior to your appointment time.  Please note: We ask at that you not bring children with you during ultrasound (echo/ vascular) testing. Due to room size and safety concerns, children are not allowed in the ultrasound rooms during exams. Our front office staff cannot provide observation of children in our lobby area while testing is being conducted. An adult accompanying a patient to their appointment will only be allowed in the ultrasound room at the discretion of the ultrasound technician under special circumstances. We apologize for any inconvenience.  If you have any questions or concerns before your next appointment please send [place] a message through [place] or call our office at [phone].    TO LEAVE A MESSAGE FOR THE NURSE SELECT OPTION 2, PLEASE LEAVE A MESSAGE INCLUDING: YOUR NAME DATE OF BIRTH CALL BACK NUMBER REASON FOR CALL**this is important as we prioritize the call backs  YOU WILL RECEIVE A CALL BACK THE SAME DAY AS LONG AS YOU CALL BEFORE 4:00 PM   Do the following things EVERYDAY: Weigh yourself in the morning before breakfast. Write it down and keep it in a log. Take your medicines as prescribed Eat low salt  foods--Limit salt (sodium) to 2000 mg per day.  Stay as active as you can everyday Limit all fluids for the day to less than 2 liters   At the [facility], you and your health needs are our priority. As part of our continuing mission to provide you with exceptional heart care, we have created designated Provider Care Teams. These Care Teams include your primary Cardiologist (physician) and Advanced Practice Providers (APPs- Physician Assistants and Nurse Practitioners) who all work together to provide you with the care you need, when you need it.   You may see any of the following providers on your designated Care Team at your next follow up: Dr Arvilla Meres Dr Marca Ancona Dr. Marcos Eke, NP Robbie Lis, [place] [facility] [place], [place] Brynda Peon, NP Karle Plumber, PharmD   Please be sure to bring in all your medications bottles to every appointment.    Thank you for choosing [facility]

## 2023-06-10 NOTE — Progress Notes (Addendum)
ADVANCED [facility] CLINIC  NOTE  Referring Physician: Joselyn Arrow, MD Primary Care: Joselyn Arrow, MD Primary Cardiologist: Dr. 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 Shari Prows for further evalaution of her HF.    Last cath 04/2022 with patent LIMA to LAD, patent RCA with patent RCA stents, patent Lcx stent. Stable anatomy since 2020. RHC with EF <25%, severe pulmonary HTN with PASP , PVR 3.49 WU, PCWP . TTE 03/2022 with EF 20-25%, G3DD, akinesis of the basal-mid inferior, distal lateral apical walls, hypokinesis elsewhere, G3DD, normal RV, mild MR.  She was previously followed by Dr. Katrinka Blazing and Dr. Shari Prows  I saw her for first time in 4/24 and had end-stage HF symptoms. Entresto and carvedilol cut back. Underwent CRT-D 4/24   Here for f/u:   Echo 02/19/23: EF 25-30%   Continues to feel better after CRT. Very active. Has been chasing great grandchildren around. Denies DOE, edema, orthopnea or PND. Tolerating GDMT well  ICD interrogation: No VT/AF. 100% bivpacing activity level 6hr/day (!) Volume ok Personally reviewed .    Past Medical History:  Diagnosis Date   Adenomatous colon polyp 03/2008   CHF (congestive heart failure) (HCC)    Constipation    Coronary artery disease    Last cath 12/14 Cath 12/8 100% SVG occlusion to RCA, 95% SVG.  The stenosis to OM.  LIMA to LAD patent but diffuse disease in LAD after graft.  Overlapping stents mid/distal SVG to OM.   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 90 tablet 2   carvedilol (COREG) 3.125 MG tablet TAKE 2 TABLETS(6.25 MG) BY MOUTH TWICE DAILY 180 tablet 3   cholecalciferol (VITAMIN D) 1000 UNITS tablet Take 1,000 Units by mouth in the morning.     clopidogrel (PLAVIX) 75 MG tablet Take 1 tablet (75 mg total) by mouth daily. 90 tablet 3   Coenzyme Q10 (COQ10 PO) Take 1 capsule by mouth in the morning.     dapagliflozin propanediol (FARXIGA) 10 MG TABS tablet Take 1 tablet (10 mg total) by mouth daily before breakfast. 90 tablet 3   digoxin (LANOXIN) 0.125 MG tablet Take 0.5 tablets (0.0625 mg total) by mouth daily. 45 tablet 3   diphenhydramine-acetaminophen (TYLENOL PM) 25-500 MG TABS tablet Take 1.5 tablets by mouth at bedtime as needed (sleep).     furosemide (LASIX) 20 MG tablet Take 1 tablet (20 mg total) by mouth 3 (three) times a  week. Take on Mondays, Wednesdays, and Fridays.     MAGNESIUM PO Take 1 tablet by mouth in the morning.     Multiple Vitamin (MULTIVITAMIN) tablet Take 1 tablet by mouth in the morning.     nitroGLYCERIN (NITROSTAT) 0.4 MG SL tablet PLACE AND DISSOLVE 1 TABLET UNDER THE TONGUE EVERY 5 MINUTE AS NEEDED FOR CHEST PAIN 25 tablet 7   polyethylene glycol (MIRALAX / GLYCOLAX) 17 g packet Take 17 g by mouth in the morning.     Potassium 99 MG TABS Take 3 tablets by mouth in the morning.     sacubitril-valsartan (ENTRESTO) 24-26 MG Take 1 tablet by mouth 2 (two) times daily. 60 tablet 11   spironolactone (ALDACTONE) 25 MG tablet Take 0.5 tablets  (12.5 mg total) by mouth daily. 45 tablet 3   Current Facility-Administered Medications  Medication Dose Route Frequency Provider Last Rate Last Admin   sodium chloride flush (NS) 0.9 % injection 3 mL  3 mL Intravenous Q12H [place] Islam., NP        Allergies  Allergen Reactions   Contrast Media [Iodinated Contrast Media] Hives   Pravastatin Other (See Comments)    muscle aches   Simvastatin Other (See Comments)    Muscle cramps   Tramadol Hcl Nausea And Vomiting   Zetia [Ezetimibe] Other (See Comments)    Muscle cramps   Oxycodone Nausea And Vomiting and Other (See Comments)    Extreme nausea and vomiting   Repatha [Evolocumab] Other (See Comments)    Muscle cramps      Social History   Socioeconomic History   Marital status: Widowed    Spouse name: Not on file   Number of children: 2   Years of education: 52   Highest education level: Associate degree: occupational, Scientist, product/process development, or vocational program  Occupational History   Occupation: retired    Comment: LPN  Tobacco Use   Smoking status: Never   Smokeless tobacco: Never  Vaping Use   Vaping status: Never Used  Substance and Sexual Activity   Alcohol use: Yes    Alcohol/week: 2.0 standard drinks of alcohol    Types: 2 Glasses of wine per week    Comment: 1-2/month   Drug use: No   Sexual activity: Not Currently  Other Topics Concern   Not on file  Social History Narrative   Widowed.  Lives alone.     Retired Public house manager.     1 son in [place], 1 son in [place]. 7 grandchildren, total of 5 great grandchildren (3 girls, 2 boys; 1 was adopted from [place], Down's syndrome) all in [place].   Updated 05/2023   Social Drivers of Health   Financial Resource Strain: Low Risk  (05/26/2019)   Overall Financial Resource Strain (CARDIA)    Difficulty of Paying Living Expenses: Not hard at all  Food Insecurity: No Food Insecurity (10/09/2022)   Hunger Vital Sign    Worried About Running Out of Food in the Last Year: Never true     Ran Out of Food in the Last Year: Never true  Transportation Needs: No Transportation Needs (10/09/2022)   PRAPARE - Administrator, Civil Service (Medical): No    Lack of Transportation (Non-Medical): No  Physical Activity: Insufficiently Active (05/26/2019)   Exercise Vital Sign    Days of Exercise per Week: 5 days    Minutes of Exercise per Session: 20 min  Stress: No Stress Concern Present (05/26/2019)   [employer] of  Occupational Health - Occupational Stress Questionnaire    Feeling of Stress : Not at all  Social Connections: Unknown (05/26/2019)   Social Connection and Isolation Panel [NHANES]    Frequency of Communication with Friends and Family: Not asked    Frequency of Social Gatherings with Friends and Family: Not asked    Attends Religious Services: Not on file    Active Member of Clubs or Organizations: Not on file    Attends Banker Meetings: Not asked    Marital Status: Widowed  Catering manager Violence: Not on file      Family History  Problem Relation Age of Onset   Hypertension Mother    Heart attack Mother 63   Hypertension Brother    Cancer Brother        prostate (76), gall bladder (dx 35)   Heart disease Brother 89       stent   Prostate cancer Brother    Heart disease Other        x2 (age 54 and 67)   Diabetes Neg Hx    Breast cancer Neg Hx    Colon cancer Neg Hx    Stomach cancer Neg Hx    Rectal cancer Neg Hx    Liver cancer Neg Hx    Pancreatic cancer Neg Hx    Esophageal cancer Neg Hx     Vitals:   06/10/23 1422  BP: 132/82  Pulse: 78  SpO2: 98%  Weight: 58.6 kg (129 lb 3.2 oz)  Height: 5\' 6"  (1.676 m)     PHYSICAL EXAM: General:  Well appearing. No resp difficulty HEENT: normal Neck: supple. no JVD. Carotids 2+ bilat; no bruits. No lymphadenopathy or thryomegaly appreciated. Cor: PMI nondisplaced. Regular rate & rhythm. No rubs, gallops or murmurs. Lungs: clear Abdomen: soft, nontender,  nondistended. No hepatosplenomegaly. No bruits or masses. Good bowel sounds. Extremities: no cyanosis, clubbing, rash, edema Neuro: alert & orientedx3, cranial nerves grossly intact. moves all 4 extremities w/o difficulty. Affect pleasant   ASSESSMENT & PLAN:  1. Chronic Combined Systolic and Diastolic HF: - due to iCM - Echo 10/23 wEF 20-25% with akinesis of the basal to mid septum, basal to mid inferior, distal lateral and apical walls. G3DD, mild MR - Myoview 1/23 EF 23%  - Echo 2/24 EF 20-25% - s/p CRT-D 4/24 - Echo 02/19/23: EF 25-30% Personally reviewed - Much improved with CRT. NYHA I-II. Volume status looks great  - Volume status ok Continue lasix 20mg  qod - Continue carvedilol to 3.125 mg BID (unable to tolerate higher doses due to fatigue and low BP) - Continue farxiga 10mg  daily - Continue entresto 24/26mg  BID (unable to tolerate higher doses due to fatigue and low BP) - Continue spironolactone 12.5mg  daily - stop digoxin - RTC in 3 months with repeat echo to assess for improved LV function with CRT   2. CAD s/p CABG with subsequent PCI: - s/p CABG 2014 (Dr. Cornelius Moras)  - LHC 04/2022 with patent LIMA to LAD, patent RCA stents, patent Lcx stent. LVEF severely depressed 20-25% as detailed above. - No s/s angina -Continue lipitor 10mg  daily -Continue plavix 75mg  daily  3.Paroxysmal Afib: - S/p MAZE with no known recurrence. Not on AC. - No AF on device today   4. LBBB - s/p CRT-D in 4/24 - doing well as above - Device interrogated personally in clinic and reviewed with her   Arvilla Meres, MD  2:31 PM

## 2023-06-12 ENCOUNTER — Other Ambulatory Visit (HOSPITAL_COMMUNITY): Payer: Self-pay | Admitting: Cardiology

## 2023-06-12 DIAGNOSIS — H34231 Retinal artery branch occlusion, right eye: Secondary | ICD-10-CM | POA: Diagnosis not present

## 2023-06-12 DIAGNOSIS — H353131 Nonexudative age-related macular degeneration, bilateral, early dry stage: Secondary | ICD-10-CM | POA: Diagnosis not present

## 2023-06-12 DIAGNOSIS — H43813 Vitreous degeneration, bilateral: Secondary | ICD-10-CM | POA: Diagnosis not present

## 2023-06-12 DIAGNOSIS — H348112 Central retinal vein occlusion, right eye, stable: Secondary | ICD-10-CM | POA: Diagnosis not present

## 2023-06-12 MED ORDER — DAPAGLIFLOZIN PROPANEDIOL 10 MG PO TABS: 10.0000 mg | ORAL_TABLET | Freq: Every day | ORAL | 3 refills | Status: DC

## 2023-06-12 NOTE — Telephone Encounter (Signed)
Advanced [facility] Failure Patient Advocate Encounter  The patient was approved for a [employer] grant that will help cover the cost of Carvedilol, Entresto, Farxiga, Spironolactone.  Total amount awarded, $10,000.  Effective: 05/13/2023 - 05/11/2024.  BIN [id number] PCN [id number] Group [id number] ID [id number]  Pharmacy provided with approval and processing information. Confirmed $0 copay for Farxiga sent in to [facility]. Patient informed via phone.  Burnell Blanks, CPhT Rx Patient Advocate Phone: [phone]

## 2023-06-24 ENCOUNTER — Encounter (HOSPITAL_COMMUNITY): Payer: Medicare Other

## 2023-06-27 ENCOUNTER — Other Ambulatory Visit (HOSPITAL_COMMUNITY): Payer: Self-pay

## 2023-06-27 ENCOUNTER — Other Ambulatory Visit: Payer: Self-pay

## 2023-06-27 MED ORDER — CLOPIDOGREL BISULFATE 75 MG PO TABS
75.0000 mg | ORAL_TABLET | Freq: Every day | ORAL | 3 refills | Status: DC
  Filled 2023-06-27 – 2023-07-23 (×2): qty 90, 90d supply, fill #0

## 2023-06-27 NOTE — Telephone Encounter (Signed)
 This is a CHF pt

## 2023-06-30 ENCOUNTER — Other Ambulatory Visit: Payer: Self-pay | Admitting: Nurse Practitioner

## 2023-07-08 ENCOUNTER — Other Ambulatory Visit (HOSPITAL_COMMUNITY): Payer: Self-pay

## 2023-07-08 ENCOUNTER — Other Ambulatory Visit: Payer: Self-pay

## 2023-07-23 ENCOUNTER — Other Ambulatory Visit (HOSPITAL_COMMUNITY): Payer: Self-pay

## 2023-07-23 ENCOUNTER — Ambulatory Visit (INDEPENDENT_AMBULATORY_CARE_PROVIDER_SITE_OTHER): Payer: Medicare Other

## 2023-07-23 DIAGNOSIS — I255 Ischemic cardiomyopathy: Secondary | ICD-10-CM

## 2023-07-23 LAB — CUP PACEART REMOTE DEVICE CHECK
Battery Remaining Longevity: 133 mo
Battery Voltage: 3.02 V
Brady Statistic AP VP Percent: 0.32 %
Brady Statistic AP VS Percent: 0.01 %
Brady Statistic AS VP Percent: 98.89 %
Brady Statistic AS VS Percent: 0.78 %
Brady Statistic RA Percent Paced: 0.44 %
Brady Statistic RV Percent Paced: 0 %
Date Time Interrogation Session: 20250127195928
HighPow Impedance: 69 Ohm
Implantable Lead Connection Status: 753985
Implantable Lead Connection Status: 753985
Implantable Lead Connection Status: 753985
Implantable Lead Implant Date: 20240429
Implantable Lead Implant Date: 20240429
Implantable Lead Implant Date: 20240429
Implantable Lead Location: 753859
Implantable Lead Location: 753860
Implantable Lead Location: 753860
Implantable Lead Model: 3830
Implantable Lead Model: 5076
Implantable Pulse Generator Implant Date: 20240429
Lead Channel Impedance Value: 266 Ohm
Lead Channel Impedance Value: 342 Ohm
Lead Channel Impedance Value: 418 Ohm
Lead Channel Impedance Value: 456 Ohm
Lead Channel Impedance Value: 494 Ohm
Lead Channel Impedance Value: 646 Ohm
Lead Channel Pacing Threshold Amplitude: 0.5 V
Lead Channel Pacing Threshold Pulse Width: 0.4 ms
Lead Channel Sensing Intrinsic Amplitude: 1.1 mV
Lead Channel Sensing Intrinsic Amplitude: 8.9 mV
Lead Channel Setting Pacing Amplitude: 1 V
Lead Channel Setting Pacing Amplitude: 3.5 V
Lead Channel Setting Pacing Pulse Width: 0.4 ms
Lead Channel Setting Sensing Sensitivity: 0.3 mV
Zone Setting Status: 755011
Zone Setting Status: 755011
Zone Setting Status: 755011

## 2023-07-26 ENCOUNTER — Encounter: Payer: Self-pay | Admitting: Cardiovascular Disease

## 2023-07-28 NOTE — Progress Notes (Signed)
 Cardiology Office Note:  .   Date:  07/28/2023  ID:  Claudia Howell, DOB 29-Sep-1940, MRN [id number] PCP: Randol Dawes, MD  [facility] Providers Cardiologist:  Victory LELON Claudene DOUGLAS, MD (Inactive) Electrophysiologist:  Eulas FORBES Furbish, MD {  History of Present Illness: .   Claudia Howell is a 83 y.o. female w/PMHx of CAD (CABG x3 in 2014 with MAZE, PCI to SVG in 2014, NSTEMI in 04/2019 treated with PCI to RCA), HTN, HLD, AFib (hx of MAZE, LAA clip as well), chronic CHF (systolic/diastolic), ICD, ICM, LBBB  Referred to HF team April 2024, described end stage HF  Saw Dr. Furbish 01/29/23, post implant, discussed no acceptable veins for LV lead >> LB area pacing lead placed with improvement in QRS duration of approx 20ms. At this visit she reported some improvement in energy Discussed not on OAC given hx of LAA clip  She saw Dr. Cherrie 06/10/23, symptoms improved post CRT, no arrhythmias, BP 100% Active 6 hours/day volume stable Dig stopped Entresto  dose limited by BP Planned for echo in a few months  Today's visit is scheduled as a 6 mo visit  ROS:   She continues to gain stamina, in fact in the las few weeks markedly better and wondedered if we had perhaps made some programming changes from here. No CP, palpitations No SOB No near syncope or syncope   Device information MDT CRT-D implanted 10/22/22 Does not have a CS lead >> no suitable veins/branches Has a LB area/conduction system pacing lead  Arrhythmia/AAD hx Amiodarone  briefly back in 2014 pre-post CABG  Studies Reviewed: SABRA    EKG not done today  DEVICE interrogation done today and reviewed by myself Battery and lead measurements are good No arrhythmias LV pacing (LB area) 99% Programmed LV only pacing Wavelet templet report too low EGM 2 range reduced to 8, new wavelet collected RA lead output was still at 3.5V and reduced to 2.5 (leaving appropriate safety margin)   Echo 02/19/23: EF 25-30%   cath  04/2022 with patent LIMA to LAD, patent RCA with patent RCA stents, patent Lcx stent. Stable anatomy since 2020.  RHC with EF <25%, severe pulmonary HTN with PASP , PVR 3.49 WU, PCWP .   TTE 03/2022 with EF 20-25%, G3DD, akinesis of the basal-mid inferior, distal lateral apical walls, hypokinesis elsewhere, G3DD, normal RV, mild MR.    Risk Assessment/Calculations:    Physical Exam:   VS:  There were no vitals taken for this visit.   Wt Readings from Last 3 Encounters:  06/10/23 129 lb 3.2 oz (58.6 kg)  05/29/23 128 lb 12.8 oz (58.4 kg)  02/19/23 131 lb (59.4 kg)    GEN: Well nourished, well developed in no acute distress NECK: No JVD; No carotid bruits CARDIAC: RRR, no murmurs, rubs, gallops RESPIRATORY:  CTA b/l without rales, wheezing or rhonchi  ABDOMEN: Soft, non-tender, non-distended EXTREMITIES:  No edema; No deformity   ICD site: is stable, no thinning, fluctuation, tethering  ASSESSMENT AND PLAN: .     ICD intact function programming changes as above  Paroxysmal Afib CHA2DS2Vasc is 6, not on a/c 2/2 hx opf LAA clip zero % burden  CAD No anginal symptoms On clopidogrel , BB, statin C/w Dr. Boone  ICM Chronic CHF On BB, entresto , farxiga , spironolactone  Volume stable OptiVol looks great C/w Dr. Boone      Dispo: remotes as usual, in clinic again in 1 year, sooner if needed  Signed, Neco Kling Macario Arthur, PA-C

## 2023-07-30 ENCOUNTER — Ambulatory Visit: Payer: Medicare Other | Attending: Physician Assistant | Admitting: Physician Assistant

## 2023-07-30 VITALS — BP 120/70 | HR 76 | Resp 16 | Ht 66.0 in | Wt 131.4 lb

## 2023-07-30 DIAGNOSIS — I5022 Chronic systolic (congestive) heart failure: Secondary | ICD-10-CM | POA: Insufficient documentation

## 2023-07-30 DIAGNOSIS — I2510 Atherosclerotic heart disease of native coronary artery without angina pectoris: Secondary | ICD-10-CM | POA: Insufficient documentation

## 2023-07-30 DIAGNOSIS — I480 Paroxysmal atrial fibrillation: Secondary | ICD-10-CM | POA: Insufficient documentation

## 2023-07-30 DIAGNOSIS — Z95810 Presence of automatic (implantable) cardiac defibrillator: Secondary | ICD-10-CM | POA: Insufficient documentation

## 2023-07-30 DIAGNOSIS — I255 Ischemic cardiomyopathy: Secondary | ICD-10-CM | POA: Insufficient documentation

## 2023-07-30 LAB — CUP PACEART INCLINIC DEVICE CHECK
Date Time Interrogation Session: 20250204181459
Implantable Lead Connection Status: 753985
Implantable Lead Connection Status: 753985
Implantable Lead Connection Status: 753985
Implantable Lead Implant Date: 20240429
Implantable Lead Implant Date: 20240429
Implantable Lead Implant Date: 20240429
Implantable Lead Location: 753859
Implantable Lead Location: 753860
Implantable Lead Location: 753860
Implantable Lead Model: 3830
Implantable Lead Model: 5076
Implantable Pulse Generator Implant Date: 20240429

## 2023-07-30 NOTE — Patient Instructions (Signed)
 Medication Instructions:   Your physician recommends that you continue on your current medications as directed. Please refer to the Current Medication list given to you today.   *If you need a refill on your cardiac medications before your next appointment, please call your pharmacy*   Lab Work:  NONE ORDERED  TODAY    If you have labs (blood work) drawn today and your tests are completely normal, you will receive your results only by: MyChart Message (if you have MyChart) OR A paper copy in the mail If you have any lab test that is abnormal or we need to change your treatment, we will call you to review the results.   Testing/Procedures:  NONE ORDERED  TODAY    Follow-Up: At [facility], you and your health needs are our priority.  As part of our continuing mission to provide you with exceptional heart care, we have created designated Provider Care Teams.  These Care Teams include your primary Cardiologist (physician) and Advanced Practice Providers (APPs -  Physician Assistants and Nurse Practitioners) who all work together to provide you with the care you need, when you need it.  We recommend signing up for the patient portal called "MyChart".  Sign up information is provided on this After Visit Summary.  MyChart is used to connect with patients for Virtual Visits (Telemedicine).  Patients are able to view lab/test results, encounter notes, upcoming appointments, etc.  Non-urgent messages can be sent to your provider as well.   To learn more about what you can do with MyChart, go to [email or web address].    Your next appointment:   1 year(s)  Provider:   York Pellant, MD or Francis Dowse, PA-C    Other Instructions

## 2023-08-06 ENCOUNTER — Encounter: Payer: Self-pay | Admitting: Cardiovascular Disease

## 2023-08-25 NOTE — Progress Notes (Signed)
 [facility]  NOTE  Referring Physician: Joselyn Arrow, MD Primary Care: Joselyn Arrow, MD Primary Cardiologist: Dr. 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 Shari Prows for further evalaution of her HF.    Last cath 04/2022 with patent LIMA to LAD, patent RCA with patent RCA stents, patent Lcx stent. Stable anatomy since 2020. RHC with EF <25%, severe pulmonary HTN with PASP , PVR 3.49 WU, PCWP . TTE 03/2022 with EF 20-25%, G3DD, akinesis of the basal-mid inferior, distal lateral apical walls, hypokinesis elsewhere, G3DD, normal RV, mild MR.  She was previously followed by Dr. Katrinka Blazing and Dr. Shari Prows  I saw her for first time in 4/24 and had end-stage HF symptoms. Entresto and carvedilol cut back. Underwent CRT-D 4/24   Echo 02/19/23: EF 25-30%   Here for f/u, Continues to feel better after CRT. Very active. Has been chasing great grandchildren around. Denies DOE, edema, orthopnea or PND. Tolerating GDMT well  ICD interrogation: No VT/AF. 100% bivpacing activity level 6hr/day (!) Volume ok Personally reviewed .    Past Medical History:  Diagnosis Date   Adenomatous colon polyp 03/2008   CHF (congestive heart failure) (HCC)    Constipation    Coronary artery disease    Last cath 12/14 Cath 12/8 100% SVG occlusion to RCA, 95% SVG.  The stenosis to OM.  LIMA to LAD patent but diffuse disease in LAD after graft.  Overlapping stents mid/distal SVG to OM.   Decubitus ulcer of coccyx 10/22/2012   1/2 inch raw open area on coccyx   Hyperlipidemia    Hypertension    Dr.Henry Katrinka Blazing   Myocardial infarction [facility])    Osteopenia 02/2008   PAF (paroxysmal atrial fibrillation) (HCC)    during cath/notes 10/16/2012   Right posterior capsular  opacification 11/16/2019   S/P CABG x 3 10/22/2012   LIMA to LAD, SVG to distal RCA, SVG to OM, EVH from right thigh   S/P Maze operation for atrial fibrillation 10/22/2012   Left side lesion set using bipolar radiofrequency and cryothermy ablation with clipping of LA appendage   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 90 tablet 2   carvedilol (COREG) 3.125 MG tablet TAKE 2 TABLETS(6.25 MG) BY MOUTH TWICE DAILY 180 tablet 3   cholecalciferol (VITAMIN D) 1000 UNITS tablet Take 1,000 Units by mouth in the morning.     clopidogrel (PLAVIX) 75 MG tablet Take 1 tablet (75 mg total) by mouth daily. 90 tablet 3   Coenzyme Q10 (COQ10 PO) Take 1 capsule by mouth in the morning.     dapagliflozin propanediol (FARXIGA) 10 MG TABS tablet Take 1 tablet (10 mg total) by mouth daily before breakfast. 90 tablet 3   diphenhydramine-acetaminophen (TYLENOL PM) 25-500 MG TABS tablet Take 1.5 tablets by mouth at bedtime as needed (sleep).     furosemide (LASIX) 20 MG tablet TAKE 1 TABLET(20 MG) BY MOUTH DAILY 90 tablet 3   MAGNESIUM PO Take 1 tablet by mouth in the morning.     Multiple Vitamin (MULTIVITAMIN) tablet Take 1 tablet  by mouth in the morning.     nitroGLYCERIN (NITROSTAT) 0.4 MG SL tablet PLACE AND DISSOLVE 1 TABLET UNDER THE TONGUE EVERY 5 MINUTE AS NEEDED FOR CHEST PAIN 25 tablet 7   polyethylene glycol (MIRALAX / GLYCOLAX) 17 g packet Take 17 g by mouth in the morning.     Potassium 99 MG TABS Take 3 tablets by mouth in the morning.     sacubitril-valsartan (ENTRESTO) 24-26 MG Take 1 tablet by mouth 2 (two) times daily. 60 tablet 11   spironolactone (ALDACTONE) 25 MG tablet Take 0.5 tablets (12.5 mg total) by mouth daily. 45 tablet 3   Current Facility-Administered Medications  Medication Dose Route Frequency Provider Last Rate Last Admin    sodium chloride flush (NS) 0.9 % injection 3 mL  3 mL Intravenous Q12H Gaston Islam., NP        Allergies  Allergen Reactions   Contrast Media [Iodinated Contrast Media] Hives   Pravastatin Other (See Comments)    muscle aches   Simvastatin Other (See Comments)    Muscle cramps   Tramadol Hcl Nausea And Vomiting   Zetia [Ezetimibe] Other (See Comments)    Muscle cramps   Oxycodone Nausea And Vomiting and Other (See Comments)    Extreme nausea and vomiting   Repatha [Evolocumab] Other (See Comments)    Muscle cramps      Social History   Socioeconomic History   Marital status: Widowed    Spouse name: Not on file   Number of children: 2   Years of education: 39   Highest education level: Associate degree: occupational, Scientist, product/process development, or vocational program  Occupational History   Occupation: retired    Comment: LPN  Tobacco Use   Smoking status: Never   Smokeless tobacco: Never  Vaping Use   Vaping status: Never Used  Substance and Sexual Activity   Alcohol use: Yes    Alcohol/week: 2.0 standard drinks of alcohol    Types: 2 Glasses of wine per week    Comment: 1-2/month   Drug use: No   Sexual activity: Not Currently  Other Topics Concern   Not on file  Social History Narrative   Widowed.  Lives alone.     Retired Public house manager.     1 son in [place], 1 son in [place]. 7 grandchildren, total of 5 great grandchildren (3 girls, 2 boys; 1 was adopted from [place], Down's syndrome) all in [place].   Updated 05/2023   Social Drivers of Health   Financial Resource Strain: Low Risk  (05/26/2019)   Overall Financial Resource Strain (CARDIA)    Difficulty of Paying Living Expenses: Not hard at all  Food Insecurity: No Food Insecurity (10/09/2022)   Hunger Vital Sign    Worried About Running Out of Food in the Last Year: Never true    Ran Out of Food in the Last Year: Never true  Transportation Needs: No Transportation Needs (10/09/2022)   PRAPARE - Scientist, research (physical sciences) (Medical): No    Lack of Transportation (Non-Medical): No  Physical Activity: Insufficiently Active (05/26/2019)   Exercise Vital Sign    Days of Exercise per Week: 5 days    Minutes of Exercise per Session: 20 min  Stress: No Stress Concern Present (05/26/2019)   [employer] of Occupational Health - Occupational Stress Questionnaire    Feeling of Stress : Not at all  Social Connections: Unknown (05/26/2019)   Social Connection and Isolation Panel [NHANES]  Frequency of Communication with Friends and Family: Not asked    Frequency of Social Gatherings with Friends and Family: Not asked    Attends Religious Services: Not on file    Active Member of Clubs or Organizations: Not on file    Attends Banker Meetings: Not asked    Marital Status: Widowed  Catering manager Violence: Not on file      Family History  Problem Relation Age of Onset   Hypertension Mother    Heart attack Mother 78   Hypertension Brother    Cancer Brother        prostate (76), gall bladder (dx 78)   Heart disease Brother 60       stent   Prostate cancer Brother    Heart disease Other        x2 (age 42 and 100)   Diabetes Neg Hx    Breast cancer Neg Hx    Colon cancer Neg Hx    Stomach cancer Neg Hx    Rectal cancer Neg Hx    Liver cancer Neg Hx    Pancreatic cancer Neg Hx    Esophageal cancer Neg Hx     There were no vitals filed for this visit.    PHYSICAL EXAM: General:  Well appearing. No resp difficulty HEENT: normal Neck: supple. no JVD. Carotids 2+ bilat; no bruits. No lymphadenopathy or thryomegaly appreciated. Cor: PMI nondisplaced. Regular rate & rhythm. No rubs, gallops or murmurs. Lungs: clear Abdomen: soft, nontender, nondistended. No hepatosplenomegaly. No bruits or masses. Good bowel sounds. Extremities: no cyanosis, clubbing, rash, edema Neuro: alert & orientedx3, cranial nerves grossly intact. moves all 4 extremities w/o difficulty. Affect  pleasant   ASSESSMENT & PLAN:  1. Chronic Combined Systolic and Diastolic HF: - due to iCM - Echo 10/23 wEF 20-25% with akinesis of the basal to mid septum, basal to mid inferior, distal lateral and apical walls. G3DD, mild MR - Myoview 1/23 EF 23%  - Echo 2/24 EF 20-25% - s/p CRT-D 4/24 - Echo 02/19/23: EF 25-30% Personally reviewed - Much improved with CRT. NYHA I-II. Volume status looks great  - Volume status ok Continue lasix 20mg  qod - Continue carvedilol to 3.125 mg BID (unable to tolerate higher doses due to fatigue and low BP) - Continue farxiga 10mg  daily - Continue entresto 24/26mg  BID (unable to tolerate higher doses due to fatigue and low BP) - Continue spironolactone 12.5mg  daily - RTC in 3 months with repeat echo to assess for improved LV function with CRT   2. CAD s/p CABG with subsequent PCI: - s/p CABG 2014 (Dr. Cornelius Moras)  - LHC 04/2022 with patent LIMA to LAD, patent RCA stents, patent Lcx stent. LVEF severely depressed 20-25% as detailed above. - No s/s angina -Continue lipitor 10mg  daily -Continue plavix 75mg  daily  3.Paroxysmal Afib: - S/p MAZE with no known recurrence. Not on AC. - No AF on device today   4. LBBB - s/p CRT-D in 4/24 - doing well as above - Device interrogated personally in clinic and reviewed with her   Arvilla Meres, MD  8:13 PM

## 2023-08-26 ENCOUNTER — Encounter (HOSPITAL_COMMUNITY): Payer: Self-pay | Admitting: Internal Medicine

## 2023-08-26 ENCOUNTER — Ambulatory Visit (HOSPITAL_BASED_OUTPATIENT_CLINIC_OR_DEPARTMENT_OTHER)
Admission: RE | Admit: 2023-08-26 | Discharge: 2023-08-26 | Disposition: A | Discharge status: Home / Self Care | Payer: Medicare Other | Source: Ambulatory Visit | Attending: Internal Medicine | Admitting: Internal Medicine

## 2023-08-26 ENCOUNTER — Ambulatory Visit (HOSPITAL_COMMUNITY)
Admission: RE | Admit: 2023-08-26 | Discharge: 2023-08-26 | Disposition: A | Discharge status: Home / Self Care | Payer: Medicare Other | Source: Ambulatory Visit | Attending: Internal Medicine | Admitting: Internal Medicine

## 2023-08-26 VITALS — BP 110/60 | HR 81 | Wt 130.8 lb

## 2023-08-26 DIAGNOSIS — Z95810 Presence of automatic (implantable) cardiac defibrillator: Secondary | ICD-10-CM

## 2023-08-26 DIAGNOSIS — I447 Left bundle-branch block, unspecified: Secondary | ICD-10-CM | POA: Insufficient documentation

## 2023-08-26 DIAGNOSIS — Z955 Presence of coronary angioplasty implant and graft: Secondary | ICD-10-CM | POA: Diagnosis not present

## 2023-08-26 DIAGNOSIS — E785 Hyperlipidemia, unspecified: Secondary | ICD-10-CM | POA: Insufficient documentation

## 2023-08-26 DIAGNOSIS — Z7902 Long term (current) use of antithrombotics/antiplatelets: Secondary | ICD-10-CM | POA: Diagnosis not present

## 2023-08-26 DIAGNOSIS — I5042 Chronic combined systolic (congestive) and diastolic (congestive) heart failure: Secondary | ICD-10-CM | POA: Diagnosis not present

## 2023-08-26 DIAGNOSIS — R079 Chest pain, unspecified: Secondary | ICD-10-CM | POA: Diagnosis present

## 2023-08-26 DIAGNOSIS — I252 Old myocardial infarction: Secondary | ICD-10-CM | POA: Diagnosis not present

## 2023-08-26 DIAGNOSIS — Z951 Presence of aortocoronary bypass graft: Secondary | ICD-10-CM | POA: Insufficient documentation

## 2023-08-26 DIAGNOSIS — I2510 Atherosclerotic heart disease of native coronary artery without angina pectoris: Secondary | ICD-10-CM | POA: Diagnosis not present

## 2023-08-26 DIAGNOSIS — I5022 Chronic systolic (congestive) heart failure: Secondary | ICD-10-CM | POA: Diagnosis not present

## 2023-08-26 DIAGNOSIS — I255 Ischemic cardiomyopathy: Secondary | ICD-10-CM | POA: Insufficient documentation

## 2023-08-26 DIAGNOSIS — I480 Paroxysmal atrial fibrillation: Secondary | ICD-10-CM | POA: Insufficient documentation

## 2023-08-26 DIAGNOSIS — I110 Hypertensive heart disease with heart failure: Secondary | ICD-10-CM | POA: Insufficient documentation

## 2023-08-26 LAB — ECHOCARDIOGRAM COMPLETE
AR max vel: 1.49 cm2
AV Area VTI: 1.58 cm2
AV Area mean vel: 1.23 cm2
AV Mean grad: 2 mmHg
AV Peak grad: 3.9 mmHg
Ao pk vel: 0.99 m/s
Area-P 1/2: 3.36 cm2
Calc EF: 36.5 %
Est EF: 33
MV VTI: 1.63 cm2
S' Lateral: 4 cm
Single Plane A2C EF: 37.8 %
Single Plane A4C EF: 35.9 %

## 2023-08-26 MED ORDER — ATORVASTATIN CALCIUM 10 MG PO TABS: ORAL_TABLET | ORAL | 2 refills | Status: DC

## 2023-08-26 NOTE — Patient Instructions (Signed)
 Medication Changes:  INCREASE Atorvastatin 10mg  (1 tab) every other evening alternating with 20mg  (2 tabs) every other evening.   Special Instructions // Education:  Do the following things EVERYDAY: Weigh yourself in the morning before breakfast. Write it down and keep it in a log. Take your medicines as prescribed Eat low salt foods--Limit salt (sodium) to 2000 mg per day.  Stay as active as you can everyday Limit all fluids for the day to less than 2 liters   Follow-Up in: Please follow up with the [facility] in 6 months with Dr. Gala Romney. We currently do not have that schedule. Please call us in May in order to schedule your appointment for July.  At the [facility], you and your health needs are our priority. We have a designated team specialized in the treatment of Heart Failure. This Care Team includes your primary Heart Failure Specialized Cardiologist (physician), Advanced Practice Providers (APPs- Physician Assistants and Nurse Practitioners), and Pharmacist who all work together to provide you with the care you need, when you need it.   You may see any of the following providers on your designated Care Team at your next follow up:  Dr. Arvilla Meres Dr. Marca Ancona Dr. Dorthula Nettles Dr. Theresia Bough Tonye Becket, NP Robbie Lis, [place] [facility], [place] Brynda Peon, NP [place] Lee, NP Karle Plumber, PharmD   Please be sure to bring in all your medications bottles to every appointment.   Need to Contact us:  If you have any questions or concerns before your next appointment please send [place] a message through [place] or call our office at [phone].    TO LEAVE A MESSAGE FOR THE NURSE SELECT OPTION 2, PLEASE LEAVE A MESSAGE INCLUDING: YOUR NAME DATE OF BIRTH CALL BACK NUMBER REASON FOR CALL**this is important as we prioritize the call backs  YOU WILL RECEIVE A CALL BACK THE SAME DAY AS LONG AS YOU CALL  BEFORE 4:00 PM

## 2023-08-26 NOTE — Progress Notes (Signed)
  Echocardiogram 2D Echocardiogram has been performed.  Ocie Doyne RDCS 08/26/2023, 1:40 PM

## 2023-09-01 NOTE — Progress Notes (Signed)
 Remote ICD transmission.

## 2023-09-03 ENCOUNTER — Encounter: Payer: Self-pay | Admitting: Internal Medicine

## 2023-09-10 ENCOUNTER — Other Ambulatory Visit (HOSPITAL_COMMUNITY): Payer: Self-pay | Admitting: Cardiology

## 2023-09-10 MED ORDER — ENTRESTO 24-26 MG PO TABS: 1.0000 | ORAL_TABLET | Freq: Two times a day (BID) | ORAL | 11 refills | Status: DC

## 2023-09-10 NOTE — Telephone Encounter (Signed)
 Patient called to request refill on entresto Reports she was receiving medication directly from manufacturer and application renewal was denied   Script sent to retail pharm as requested

## 2023-10-08 ENCOUNTER — Other Ambulatory Visit: Payer: Self-pay | Admitting: Family Medicine

## 2023-10-08 DIAGNOSIS — Z1231 Encounter for screening mammogram for malignant neoplasm of breast: Secondary | ICD-10-CM

## 2023-10-10 ENCOUNTER — Ambulatory Visit
Admission: RE | Admit: 2023-10-10 | Discharge: 2023-10-10 | Disposition: A | Discharge status: Home / Self Care | Source: Ambulatory Visit | Attending: Family Medicine | Admitting: Family Medicine

## 2023-10-10 DIAGNOSIS — Z1231 Encounter for screening mammogram for malignant neoplasm of breast: Secondary | ICD-10-CM | POA: Diagnosis not present

## 2023-10-22 ENCOUNTER — Ambulatory Visit (INDEPENDENT_AMBULATORY_CARE_PROVIDER_SITE_OTHER): Payer: Medicare Other

## 2023-10-22 DIAGNOSIS — I447 Left bundle-branch block, unspecified: Secondary | ICD-10-CM

## 2023-10-22 LAB — CUP PACEART REMOTE DEVICE CHECK
Battery Remaining Longevity: 130 mo
Battery Voltage: 3.01 V
Brady Statistic AP VP Percent: 0.72 %
Brady Statistic AP VS Percent: 0.01 %
Brady Statistic AS VP Percent: 97.48 %
Brady Statistic AS VS Percent: 1.8 %
Brady Statistic RA Percent Paced: 0.89 %
Brady Statistic RV Percent Paced: 0 %
Date Time Interrogation Session: 20250428195953
HighPow Impedance: 69 Ohm
Implantable Lead Connection Status: 753985
Implantable Lead Connection Status: 753985
Implantable Lead Connection Status: 753985
Implantable Lead Implant Date: 20240429
Implantable Lead Implant Date: 20240429
Implantable Lead Implant Date: 20240429
Implantable Lead Location: 753859
Implantable Lead Location: 753860
Implantable Lead Location: 753860
Implantable Lead Model: 3830
Implantable Lead Model: 5076
Implantable Pulse Generator Implant Date: 20240429
Lead Channel Impedance Value: 266 Ohm
Lead Channel Impedance Value: 266 Ohm
Lead Channel Impedance Value: 399 Ohm
Lead Channel Impedance Value: 418 Ohm
Lead Channel Impedance Value: 475 Ohm
Lead Channel Impedance Value: 627 Ohm
Lead Channel Pacing Threshold Amplitude: 0.5 V
Lead Channel Pacing Threshold Pulse Width: 0.4 ms
Lead Channel Sensing Intrinsic Amplitude: 1 mV
Lead Channel Sensing Intrinsic Amplitude: 8.1 mV
Lead Channel Setting Pacing Amplitude: 1 V
Lead Channel Setting Pacing Amplitude: 2.5 V
Lead Channel Setting Pacing Pulse Width: 0.4 ms
Lead Channel Setting Sensing Sensitivity: 0.3 mV
Zone Setting Status: 755011
Zone Setting Status: 755011
Zone Setting Status: 755011

## 2023-10-23 ENCOUNTER — Other Ambulatory Visit (HOSPITAL_COMMUNITY): Payer: Self-pay | Admitting: Cardiology

## 2023-10-23 MED ORDER — CLOPIDOGREL BISULFATE 75 MG PO TABS
75.0000 mg | ORAL_TABLET | Freq: Every day | ORAL | 3 refills | Status: AC
Start: 2023-10-23 — End: ?

## 2023-10-24 ENCOUNTER — Other Ambulatory Visit (HOSPITAL_COMMUNITY): Payer: Self-pay

## 2023-11-01 ENCOUNTER — Other Ambulatory Visit (HOSPITAL_COMMUNITY): Payer: Self-pay | Admitting: Internal Medicine

## 2023-11-04 ENCOUNTER — Encounter: Payer: Self-pay | Admitting: Cardiovascular Disease

## 2023-11-19 ENCOUNTER — Other Ambulatory Visit: Payer: Self-pay | Admitting: *Deleted

## 2023-11-19 DIAGNOSIS — M8580 Other specified disorders of bone density and structure, unspecified site: Secondary | ICD-10-CM

## 2023-11-19 DIAGNOSIS — Z780 Asymptomatic menopausal state: Secondary | ICD-10-CM

## 2023-12-06 NOTE — Progress Notes (Signed)
 Remote ICD transmission.

## 2023-12-12 ENCOUNTER — Ambulatory Visit: Payer: Self-pay | Admitting: Family Medicine

## 2023-12-12 ENCOUNTER — Ambulatory Visit (HOSPITAL_BASED_OUTPATIENT_CLINIC_OR_DEPARTMENT_OTHER)
Admission: RE | Admit: 2023-12-12 | Discharge: 2023-12-12 | Disposition: A | Discharge status: Home / Self Care | Source: Ambulatory Visit | Attending: Family Medicine | Admitting: Family Medicine

## 2023-12-12 DIAGNOSIS — Z780 Asymptomatic menopausal state: Secondary | ICD-10-CM | POA: Insufficient documentation

## 2023-12-12 DIAGNOSIS — M8580 Other specified disorders of bone density and structure, unspecified site: Secondary | ICD-10-CM | POA: Insufficient documentation

## 2023-12-12 DIAGNOSIS — M8589 Other specified disorders of bone density and structure, multiple sites: Secondary | ICD-10-CM | POA: Diagnosis not present

## 2024-01-12 ENCOUNTER — Other Ambulatory Visit (HOSPITAL_COMMUNITY): Payer: Self-pay | Admitting: Internal Medicine

## 2024-01-21 ENCOUNTER — Ambulatory Visit: Payer: Medicare Other

## 2024-01-21 DIAGNOSIS — I447 Left bundle-branch block, unspecified: Secondary | ICD-10-CM | POA: Diagnosis not present

## 2024-01-22 LAB — CUP PACEART REMOTE DEVICE CHECK
Battery Remaining Longevity: 128 mo
Battery Voltage: 3.01 V
Brady Statistic RV Percent Paced: 0 %
Date Time Interrogation Session: 20250729005639
HighPow Impedance: 67 Ohm
Implantable Lead Connection Status: 753985
Implantable Lead Connection Status: 753985
Implantable Lead Connection Status: 753985
Implantable Lead Implant Date: 20240429
Implantable Lead Implant Date: 20240429
Implantable Lead Implant Date: 20240429
Implantable Lead Location: 753859
Implantable Lead Location: 753860
Implantable Lead Location: 753860
Implantable Lead Model: 3830
Implantable Lead Model: 5076
Implantable Pulse Generator Implant Date: 20240429
Lead Channel Impedance Value: 266 Ohm
Lead Channel Impedance Value: 285 Ohm
Lead Channel Impedance Value: 418 Ohm
Lead Channel Impedance Value: 418 Ohm
Lead Channel Impedance Value: 494 Ohm
Lead Channel Impedance Value: 646 Ohm
Lead Channel Pacing Threshold Amplitude: 0.5 V
Lead Channel Pacing Threshold Pulse Width: 0.4 ms
Lead Channel Sensing Intrinsic Amplitude: 1.4 mV
Lead Channel Sensing Intrinsic Amplitude: 7 mV
Lead Channel Setting Pacing Amplitude: 1 V
Lead Channel Setting Pacing Amplitude: 2.5 V
Lead Channel Setting Pacing Pulse Width: 0.4 ms
Lead Channel Setting Sensing Sensitivity: 0.3 mV
Zone Setting Status: 755011
Zone Setting Status: 755011
Zone Setting Status: 755011

## 2024-01-31 ENCOUNTER — Other Ambulatory Visit (HOSPITAL_COMMUNITY): Payer: Self-pay | Admitting: Internal Medicine

## 2024-02-03 ENCOUNTER — Ambulatory Visit: Payer: Self-pay | Admitting: Cardiovascular Disease

## 2024-03-09 ENCOUNTER — Ambulatory Visit (INDEPENDENT_AMBULATORY_CARE_PROVIDER_SITE_OTHER): Admitting: Family Medicine

## 2024-03-09 ENCOUNTER — Encounter: Payer: Self-pay | Admitting: Family Medicine

## 2024-03-09 VITALS — BP 132/82 | HR 72 | Temp 98.6°F | Ht 66.0 in | Wt 133.2 lb

## 2024-03-09 DIAGNOSIS — K5900 Constipation, unspecified: Secondary | ICD-10-CM

## 2024-03-09 DIAGNOSIS — Z23 Encounter for immunization: Secondary | ICD-10-CM

## 2024-03-09 DIAGNOSIS — Z5181 Encounter for therapeutic drug level monitoring: Secondary | ICD-10-CM

## 2024-03-09 DIAGNOSIS — N8110 Cystocele, unspecified: Secondary | ICD-10-CM

## 2024-03-09 NOTE — Progress Notes (Signed)
 Chief Complaint  Patient presents with   Constipation    Has been dealing with constipation that is worsening. Taking cupful of miralax , 8 senokot, 2 dulcolax daily. Sometimes also adds either milk of magnesia or magnesium  citrate.    She presents with worsening constipation. 3 weeks ago she was doing well-- 1-2 BM's daily, semi-formed.  This was with eating fruits/vegetables, daily miralax  and senokot (6-7 tablets) and 1 dulcolax daily. She uses metamucil in water once daily, plus eats fruits and vegetables.  2-2.5 weeks ago she went 2 days without a bowel movement. She then started taking milk of magnesia and Mg citrate.  Then her stools got looser, like soup, but not feeling like she was emptying well. She was afraid to leave the house, not wanting to have an accident. She has had multiple episodes of fecal incontinence (in her bed, and during the day).  In the past week, she has had some urge to poop, but isn't able to, has some straining. Last BM was a small one this morning (using the dulcolax and senna last night, miralax  this morning).  When constipation was last discussed at her [facility] in 05/2023, she had reported bowels were controlled (having daily BM's) with a capful of Miralax , Senna every night (wasn't on med list at that time, doesn't state how many she was taking, but was not also taking dulcolax at that time).  Last colonoscopy was in June 2023-- tubular adenomas were present, but due to age, stated that no routine colonoscopy follow-up was needed. Melanosis coli was noted.  She has rare episodes where she is unable to void, usually this resolves once she passes gas. Mild cystocele was noted on pelvic exam in 05/2023.  At that time we discussed Kegels, PFPT. She reports she did some Kegels, then bought a kegel machine (something that she uses externally, squeezes thighs together).   Last TSH: Lab Results  Component Value Date   TSH 3.190 05/11/2021   Denies changes to  hair/skin/nails. She has some chronic hair thinning, nothing acute/new. Energy is low related to her heart, unchanged. Bowels per HPI. No f/c, n/v. Denies abdominal pain. No dysuria or hematuria.    PMH, PSH, SH reviewed  Outpatient Encounter Medications as of 03/09/2024  Medication Sig Note   atorvastatin  (LIPITOR) 10 MG tablet TAKE 1 TABLET BY MOUTH EVERY OTHER DAY IN THE EVENING ALTERNATING WITH 2 TABLET EVERY OTHER DAY IN THE EVENING    carvedilol  (COREG ) 3.125 MG tablet TAKE 2 TABLETS(6.25 MG) BY MOUTH TWICE DAILY    cholecalciferol  (VITAMIN D ) 1000 UNITS tablet Take 1,000 Units by mouth in the morning.    clopidogrel  (PLAVIX ) 75 MG tablet Take 1 tablet (75 mg total) by mouth daily.    Coenzyme Q10 (COQ10 PO) Take 1 capsule by mouth in the morning.    dapagliflozin  propanediol (FARXIGA ) 10 MG TABS tablet Take 1 tablet (10 mg total) by mouth daily before breakfast.    diphenhydramine -acetaminophen  (TYLENOL  PM) 25-500 MG TABS tablet Take 1.5 tablets by mouth at bedtime as needed (sleep). 03/09/2024: As needed, rarely   furosemide  (LASIX ) 20 MG tablet Take 20 mg by mouth every other day.    magnesium  citrate SOLN Take 1 Bottle by mouth once. 03/09/2024: As needed, not in the last 24hrs   Magnesium  Hydroxide (DULCOLAX PO) Take 2 tablets by mouth daily. 03/09/2024: Bisacoydl, not MgOH   magnesium  hydroxide (MILK OF MAGNESIA) 400 MG/5ML suspension Take 60 mLs by mouth daily as needed for mild constipation.  03/09/2024: As needed, not in the last 24 hours   MAGNESIUM  PO Take 1 tablet by mouth in the morning.    Multiple Vitamin (MULTIVITAMIN) tablet Take 1 tablet by mouth in the morning.    polyethylene glycol (MIRALAX  / GLYCOLAX ) 17 g packet Take 17 g by mouth in the morning.    Potassium 99 MG TABS Take 3 tablets by mouth in the morning.    sacubitril -valsartan  (ENTRESTO ) 24-26 MG TAKE 1 TABLET BY MOUTH TWICE DAILY    Sennosides (SENOKOT PO) Take 8 tablets by mouth daily.    spironolactone   (ALDACTONE ) 25 MG tablet Take 0.5 tablets (12.5 mg total) by mouth daily.    [DISCONTINUED] furosemide  (LASIX ) 20 MG tablet TAKE 1 TABLET(20 MG) BY MOUTH DAILY    Facility-Administered Encounter Medications as of 03/09/2024  Medication   sodium chloride  flush (NS) 0.9 % injection 3 mL   Allergies  Allergen Reactions   Contrast Media [Iodinated Contrast Media] Hives   Pravastatin Other (See Comments)    muscle aches   Simvastatin Other (See Comments)    Muscle cramps   Tramadol  Hcl Nausea And Vomiting   Zetia  [Ezetimibe ] Other (See Comments)    Muscle cramps   Oxycodone  Nausea And Vomiting and Other (See Comments)    Extreme nausea and vomiting   Repatha  [Evolocumab ] Other (See Comments)    Muscle cramps    ROS:    PHYSICAL EXAM:  BP 132/82   Pulse 72   Temp 98.6 F (37 C) (Tympanic)   Ht 5' 6 (1.676 m)   Wt 133 lb 3.2 oz (60.4 kg)   BMI 21.50 kg/m   Wt Readings from Last 3 Encounters:  03/09/24 133 lb 3.2 oz (60.4 kg)  08/26/23 130 lb 12.8 oz (59.3 kg)  07/30/23 131 lb 6.4 oz (59.6 kg)   Well-appearing, pleasant female in no distress HEENT: conjunctiva and sclera are clear, EOMI.  Neck: no lymphadenopathy, thyromegaly or mass Heart: regular rate and rhythm Lungs: clear bilaterally Back: no spinal or CVA tenderness Abdomen: Hyperactive BS, soft, nontender, nondistended, no mass Extremities: no edema Neuro: alert and oriented, cranial nerves grossly intact, normal gait Psych: normal mood, affect, hygiene and grooming   ASSESSMENT/PLAN:  Constipation, unspecified constipation type - chronic constipation, now with overuse of laxatives, with stools being very loose/unformed. Possibly not emptying well. +fecal incontinence - Plan: Comprehensive metabolic panel with GFR, Magnesium , TSH  Female cystocele - given info on Kegel's exercises, to consider pelvic floor PT. The device she is using seems to help more with inner thigh strength  Need for influenza  vaccination - Plan: Flu vaccine HIGH DOSE PF(Fluzone Trivalent)  Medication monitoring encounter - pt taking multiple types of Mg products--ensure serum levels aren't elevated. - Plan: Comprehensive metabolic panel with GFR, Magnesium    Please cut back on the use of all laxatives. Take only what was working for you a couple of months ago (which included daily Senna, but fewer tablets), and miralax  and metamucil. Stop the Magnesiums, the extra senna and dulcolax. Use a probiotic to help reset your gut flora.  Please contact [facility] (you previously saw Dr. Charlanne there for your colonoscopy--if they say you need a referral, let me know). I encourage you to see them--they may be able to fine tune your regimen, and possibly start other medications that can be very useful for chronic constipation (such as Linzess, amitiza)  I encourage also to do the Kegel's exercises regularly--both for help with the bladder, and also  to strengthen the rectal tone (anal sphincter tone). I would be happy to refer you for pelvic floor physical therapy, to help more than just you doing kegel's on your own.  Let me know if/when you are interested in this.  I spent 37 minutes dedicated to the care of this patient, including pre-visit review of records, face to face time, post-visit ordering of testing and documentation.

## 2024-03-09 NOTE — Patient Instructions (Signed)
 Please cut back on the use of all laxatives. Take only what was working for you a couple of months ago (which included daily Senna, but much fewer tablets), and miralax  and metamucil. Stop the Magnesiums, the extra senna and dulcolax. Use a probiotic to help reset your gut flora.  Please contact [facility] (you previously saw Dr. Charlanne there for your colonoscopy--if they say you need a referral, let me know). I encourage you to see them--they may be able to fine tune your regimen, and possibly start other medications that can be very useful for chronic constipation (such as Linzess, amitiza)  I encourage also to do the Kegel's exercises regularly--both for help with the bladder, and also to strengthen the rectal tone (anal sphincter tone). I would be happy to refer you for pelvic floor physical therapy, to help more than just you doing kegel's on your own.  Let me know if/when you are interested in this.

## 2024-03-10 ENCOUNTER — Ambulatory Visit: Payer: Self-pay | Admitting: Family Medicine

## 2024-03-10 LAB — COMPREHENSIVE METABOLIC PANEL WITH GFR
ALT: 13 IU/L (ref 0–32)
AST: 19 IU/L (ref 0–40)
Albumin: 4.7 g/dL (ref 3.7–4.7)
Alkaline Phosphatase: 65 IU/L (ref 48–129)
BUN/Creatinine Ratio: 20 (ref 12–28)
BUN: 18 mg/dL (ref 8–27)
Bilirubin Total: 0.5 mg/dL (ref 0.0–1.2)
CO2: 22 mmol/L (ref 20–29)
Calcium: 9.5 mg/dL (ref 8.7–10.3)
Chloride: 103 mmol/L (ref 96–106)
Creatinine, Ser: 0.91 mg/dL (ref 0.57–1.00)
Globulin, Total: 2.7 g/dL (ref 1.5–4.5)
Glucose: 104 mg/dL — ABNORMAL HIGH (ref 70–99)
Potassium: 3.7 mmol/L (ref 3.5–5.2)
Sodium: 140 mmol/L (ref 134–144)
Total Protein: 7.4 g/dL (ref 6.0–8.5)
eGFR: 63 mL/min/1.73 (ref >59)

## 2024-03-10 LAB — MAGNESIUM: Magnesium: 2.4 mg/dL — ABNORMAL HIGH (ref 1.6–2.3)

## 2024-03-10 LAB — TSH: TSH: 1.67 u[IU]/mL (ref 0.450–4.500)

## 2024-03-12 ENCOUNTER — Telehealth: Payer: Self-pay | Admitting: Gastroenterology

## 2024-03-12 NOTE — Telephone Encounter (Signed)
 Patient was seen by her PCP on 9/15 & was advised to call our office to schedule an OV to discuss possible medications for constipation. She states she has dealt with this for years, but in the last 2 weeks it has been worse. Last BM was 4 days ago. Denies any pain, n/v. She's been taking miralax  & metamucil once daily, and has increased high fiber foods in her diet with no relief. OV scheduled for 9/25 at 1:30 pm with Alan, [place] & instructions given to her for miralax  purge. Advised she do the purge & then can return to miralax  & metamucil routine and may titrate miralax  to BID if needed until she is seen next week, and if symptoms worsen or if she has any concerns prior to OV then to call us  back. Pt verbalized all understanding. Last OV 10/2021 with Dr. Charlanne.

## 2024-03-12 NOTE — Telephone Encounter (Signed)
 Inbound call from patient stating that she has not had a bowel movement in 4 days. Patient was offered an appointment with Dr. Ira PA next week and she stated she needed to be seen before. Requesting a call back to discuss. Please advise.

## 2024-03-16 ENCOUNTER — Ambulatory Visit: Admitting: Family Medicine

## 2024-03-18 NOTE — Progress Notes (Unsigned)
 03/19/2024 Claudia Howell [id number] 1941/04/01  Referring provider: Randol Dawes, MD Primary GI doctor: Dr. Charlanne  ASSESSMENT AND PLAN:  Constipation Lifelong constipation but worse last 2 weeks 2 dulcolax and 10 senokot was not helping, tried MOM, mag citrate, miralax  purge not helping.  Passing gas, small volume liquid stool every 3-5 days Has had urethral prolapse, s/p TAH and BSO 30 years ago Possible rectal stenosis, could not proceed with rectal exam, uncertain if this is stenosis, from prolapse or other but patient is passing gas which is reassuring - KUB STAT for obstruction - Get CT AB and pelvis with contrast, has contrast IV allergy -will give Prednisone  50 mg at 13 hours prior to procedure, 7 hours prior to procedure, and 1 hour prior to procedure. Benadryl  50 mg 1 hour prior to procedure.   - get CBC, CMET, ESR - Will send in Trilyte - please take 6 oz every 30 mins until the impaction passes, can do 1/2 gallon on first day and 1/2 gallon on 2nd day.  - if KUB shows no obstruction can try linzess 145 mcg samples given -ER precautions given to the patient.  - patient high risk for endoscopic evaluation, may need to consider flex sig versus referral to GYN pending CT results  Personal history of advanced polyps 12/06/2021 colonoscopy for advanced polyps good bowel prep 2 TA polyps 4 to 6 mm cecum moderate sigmoid diverticulosis nonbleeding internal hemorrhoids normal TI, no recall secondary to age  CAD status post CABG 2014 On Plavix   HFrEF status post AICD 04/27/2022 LHC/RHC EF less than 25% severe pulmonary hypertension PA mean 47 mmHg graphs open 08/26/2023 echo ejection fraction 33%, unremarkable valves On Entresto  and Farxiga   History of atrial fibrillation status post maze Not on anticoagulation  Patient Care Team: Randol Dawes, MD as PCP - General (Family Medicine) Claudia Howell ORN, MD (Inactive) as PCP - Cardiology (Cardiology) Howell, Claudia BRAVO, MD as PCP -  Electrophysiology (Cardiology) Claudia Howell, OD as Referring Physician (Optometry) Claudia, Arley DELENA, MD as Consulting Physician (Ophthalmology) Claudia Blamer, MD as Consulting Physician (Dermatology) Howell, Claudia RAMAN, DPM as Consulting Physician (Podiatry) Dr. Curtistine Howell, DDS as Consulting Physician (Dentistry) Claudia Groom, MD as Consulting Physician (Gastroenterology)  HISTORY OF PRESENT ILLNESS: 83 y.o. female with a past medical history listed below presents for evaluation of worsening constipation.   Patient last seen in the office 11/08/2021 by Dr. Charlanne for history of advanced polyps.  Discussed the use of AI scribe software for clinical note transcription with the patient, who gave verbal consent to proceed.  History of Present Illness   Claudia Howell is an 83 year old female with chronic constipation who presents with worsening constipation over the past two weeks.  She has a lifelong history of constipation, which has significantly worsened over the past two weeks. Her bowel movements are described as 'little dribbles' and mostly liquid, with minimal stool passage despite the use of various laxatives. She has been using a regimen of Duopa and Senokot, escalating to Dulcolax, Senokot, milk of magnesia, and citrate of magnesia without significant relief. She has also increased her intake of fruits and vegetables.  No abdominal pain, nausea, vomiting, bloating, or weight loss, although she has gained three pounds since the onset of her symptoms. No blood in stool. She is able to pass gas, but the stool output is minimal and 'laughable.'  Her current medications include Entresto  and Farxiga  for her heart, which she has been on for  over a year, and she occasionally forgets to take spironolactone . She also takes potassium and magnesium  for cramps, which have not changed recently.  She reports that her urethra has 'dropped' for the past two to three years, which was evaluated by her  regular doctor and managed with Kegel exercises. Recently, she has experienced urinary hesitancy and effort in urination over the past week. She also reports episodes of fecal incontinence, which have kept her homebound in the past.  She underwent a colonoscopy in May 2023, which revealed a few polyps, diverticulosis, and hemorrhoids, but was otherwise unremarkable. There was no plan for recall unless symptoms developed.     She  reports that she has never smoked. She has never used smokeless tobacco. She reports current alcohol use of about 2.0 standard drinks of alcohol per week. She reports that she does not use drugs.  RELEVANT GI HISTORY, IMAGING AND LABS: Results   DIAGNOSTIC Colonoscopy: Good bowel preparation, presence of polyps, diverticulosis, and hemorrhoids (10/2021) Rectal Exam: Tight anal sphincter, presence of rectal skin tag posteriorly, unable to complete due to tightness (03/19/2024)      CBC    Component Value Date/Time   WBC 7.1 06/04/2023 0845   WBC 13.3 (H) 04/30/2019 0307   RBC 4.32 06/04/2023 0845   RBC 3.21 (L) 04/30/2019 0307   HGB 13.6 06/04/2023 0845   HCT 42.7 06/04/2023 0845   PLT 165 06/04/2023 0845   MCV 99 (H) 06/04/2023 0845   MCH 31.5 06/04/2023 0845   MCH 31.5 04/30/2019 0307   MCHC 31.9 06/04/2023 0845   MCHC 33.6 04/30/2019 0307   RDW 12.2 06/04/2023 0845   LYMPHSABS 1.4 06/04/2023 0845   MONOABS 0.7 04/24/2019 1023   EOSABS 0.2 06/04/2023 0845   BASOSABS 0.1 06/04/2023 0845   Recent Labs    06/04/23 0845  HGB 13.6    CMP     Component Value Date/Time   NA 140 03/09/2024 1651   K 3.7 03/09/2024 1651   CL 103 03/09/2024 1651   CO2 22 03/09/2024 1651   GLUCOSE 104 (H) 03/09/2024 1651   GLUCOSE 112 (H) 02/01/2023 1144   BUN 18 03/09/2024 1651   CREATININE 0.91 03/09/2024 1651   CREATININE 1.06 (H) 09/06/2016 1146   CALCIUM  9.5 03/09/2024 1651   PROT 7.4 03/09/2024 1651   ALBUMIN  4.7 03/09/2024 1651   AST 19 03/09/2024 1651    ALT 13 03/09/2024 1651   ALKPHOS 65 03/09/2024 1651   BILITOT 0.5 03/09/2024 1651   GFRNONAA 57 (L) 02/01/2023 1144   GFRAA 75 05/09/2020 1558      Latest Ref Rng & Units 03/09/2024    4:51 PM 06/04/2023    8:45 AM 05/23/2022    3:09 PM  Hepatic Function  Total Protein 6.0 - 8.5 g/dL 7.4  7.2  7.2   Albumin  3.7 - 4.7 g/dL 4.7  4.3  4.6   AST 0 - 40 IU/L 19  23  16    ALT 0 - 32 IU/L 13  12  7    Alk Phosphatase 48 - 129 IU/L 65  66  57   Total Bilirubin 0.0 - 1.2 mg/dL 0.5  0.5  0.4   Bilirubin, Direct 0.00 - 0.40 mg/dL   9.81       Current Medications:   Current Outpatient Medications (Endocrine & Metabolic):    dapagliflozin  propanediol (FARXIGA ) 10 MG TABS tablet, Take 1 tablet (10 mg total) by mouth daily before breakfast.   predniSONE  (DELTASONE ) 50  MG tablet, Give 13 hours, 7 hours, and 1 hour prior to contrast dye injection. Notify pharmacy to schedule dose as appropriate based on procedure date/time.   Current Outpatient Medications (Cardiovascular):    atorvastatin  (LIPITOR) 10 MG tablet, TAKE 1 TABLET BY MOUTH EVERY OTHER DAY IN THE EVENING ALTERNATING WITH 2 TABLET EVERY OTHER DAY IN THE EVENING   carvedilol  (COREG ) 3.125 MG tablet, TAKE 2 TABLETS(6.25 MG) BY MOUTH TWICE DAILY   furosemide  (LASIX ) 20 MG tablet, Take 20 mg by mouth every other day.   nitroGLYCERIN  (NITROSTAT ) 0.4 MG SL tablet, Place 0.4 mg under the tongue as needed.   sacubitril -valsartan  (ENTRESTO ) 24-26 MG, TAKE 1 TABLET BY MOUTH TWICE DAILY   spironolactone  (ALDACTONE ) 25 MG tablet, Take 0.5 tablets (12.5 mg total) by mouth daily.       Current Outpatient Medications (Hematological):    clopidogrel  (PLAVIX ) 75 MG tablet, Take 1 tablet (75 mg total) by mouth daily.   Current Outpatient Medications (Other):    bisacodyl  (DULCOLAX) 5 MG EC tablet, Take 5 mg by mouth daily as needed for severe constipation.   cholecalciferol  (VITAMIN D ) 1000 UNITS tablet, Take 1,000 Units by mouth in the  morning.   Coenzyme Q10 (COQ10 PO), Take 1 capsule by mouth in the morning.   MAGNESIUM  PO, Take 1 tablet by mouth in the morning.   Multiple Vitamin (MULTIVITAMIN) tablet, Take 1 tablet by mouth in the morning.   polyethylene glycol-electrolytes (NULYTELY ) 420 g solution, do 6 oz every 30 mins until the impaction passes, can do 1/2 gallon on first day and 1/2 gallon on 2nd day.  Can continue 2 fleets enemas a day until it resolves.   Potassium 99 MG TABS, Take 3 tablets by mouth in the morning.   Sennosides (SENOKOT PO), Take 8 tablets by mouth daily.  Current Facility-Administered Medications (Other):    sodium chloride  flush (NS) 0.9 % injection 3 mL  Medical History:  Past Medical History:  Diagnosis Date   Adenomatous colon polyp 03/2008   CHF (congestive heart failure) (HCC)    Constipation    Coronary artery disease    Last cath 12/14 Cath 12/8 100% SVG occlusion to RCA, 95% SVG.  The stenosis to OM.  LIMA to LAD patent but diffuse disease in LAD after graft.  Overlapping stents mid/distal SVG to OM.   Decubitus ulcer of coccyx 10/22/2012   1/2 inch raw open area on coccyx   Hyperlipidemia    Hypertension    Dr.Henry Claudia   Myocardial infarction [facility])    Osteopenia 02/2008   PAF (paroxysmal atrial fibrillation) (HCC)    during cath/notes 10/16/2012   Right posterior capsular opacification 11/16/2019   S/P CABG x 3 10/22/2012   LIMA to LAD, SVG to distal RCA, SVG to OM, EVH from right thigh   S/P Maze operation for atrial fibrillation 10/22/2012   Left side lesion set using bipolar radiofrequency and cryothermy ablation with clipping of LA appendage   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   Allergies:  Allergies  Allergen Reactions   Contrast Media [Iodinated Contrast Media] Hives   Other Nausea Only   Pravastatin Other (See Comments)    muscle aches   Simvastatin Other (See Comments)    Muscle cramps   Tramadol  Hcl Nausea And Vomiting   Zetia  [Ezetimibe ]  Other (See Comments)    Muscle cramps   Oxycodone  Nausea And Vomiting and Other (See Comments)    Extreme nausea and vomiting   Repatha  [  Evolocumab ] Other (See Comments)    Muscle cramps     Surgical History:  She  has a past surgical history that includes Total abdominal hysterectomy w/ bilateral salpingoophorectomy (1990); Tonsillectomy (1949); MAZE (N/A, 10/22/2012); Intraoprative transesophageal echocardiogram (N/A, 10/22/2012); Cardiac catheterization (10/16/2012); Coronary angioplasty with stent (04/01/2013); Coronary artery bypass graft (N/A, 10/22/2012); Dilation and curettage of uterus; left heart catheterization with coronary angiogram (N/A, 10/16/2012); left heart catheterization with coronary/graft angiogram (N/A, 06/01/2013); percutaneous coronary stent intervention (pci-s) (06/01/2013); Cataract extraction, bilateral (Bilateral); Breast cyst excision (Left, 8019-8009'd); Cataract extraction, bilateral (Bilateral, approx 2016); LEFT HEART CATH AND CORS/GRAFTS ANGIOGRAPHY (N/A, 01/01/2019); CORONARY STENT INTERVENTION (N/A, 01/01/2019); LEFT HEART CATH AND CORS/GRAFTS ANGIOGRAPHY (N/A, 04/28/2019); CORONARY CTO INTERVENTION (N/A, 04/29/2019); CORONARY ATHERECTOMY (N/A, 04/29/2019); RIGHT/LEFT HEART CATH AND CORONARY/GRAFT ANGIOGRAPHY (N/A, 05/01/2022); and BIV ICD INSERTION CRT-D (N/A, 10/22/2022). Family History:  Her family history includes Cancer in her brother; Heart attack (age of onset: 7) in her mother; Heart disease in an other family member; Heart disease (age of onset: 31) in her brother; Hypertension in her brother and mother; Prostate cancer in her brother.  REVIEW OF SYSTEMS  : All other systems reviewed and negative except where noted in the History of Present Illness.  PHYSICAL EXAM: BP 128/68   Pulse 64   Ht 5' 6 (1.676 m)   Wt 133 lb (60.3 kg)   SpO2 96%   BMI 21.47 kg/m  Physical Exam   GENERAL APPEARANCE: Well nourished, in no apparent distress. HEENT: No cervical  lymphadenopathy, unremarkable thyroid , sclerae anicteric, conjunctiva pink. RESPIRATORY: Respiratory effort normal, breath sounds equal bilaterally without rales, rhonchi, or wheezing. CARDIO: Regular rate and rhythm with no murmurs, rubs, or gallops, peripheral pulses intact. ABDOMEN: Soft, non-distended, active bowel sounds in all four quadrants, slight tenderness to palpation LLQ, no rebound, no mass appreciated but there is a fullness lower AB. Non tender umbilical hernia.  RECTAL: Rectal skin tag posteriorly, rectum tight and tender. Unable to perform internal exam. No discharge, no obvious blood.  MUSCULOSKELETAL: Full range of motion, normal gait, without edema. SKIN: Dry, intact without rashes or lesions. No jaundice. NEURO: Alert, oriented, no focal deficits. PSYCH: Cooperative, normal mood and affect. EXTREMITIES: No swelling in legs.      Alan JONELLE Coombs, PA-C 3:00 PM

## 2024-03-19 ENCOUNTER — Other Ambulatory Visit (INDEPENDENT_AMBULATORY_CARE_PROVIDER_SITE_OTHER)

## 2024-03-19 ENCOUNTER — Encounter: Payer: Self-pay | Admitting: Physician Assistant

## 2024-03-19 ENCOUNTER — Ambulatory Visit
Admission: RE | Admit: 2024-03-19 | Discharge: 2024-03-19 | Disposition: A | Discharge status: Home / Self Care | Source: Ambulatory Visit | Attending: Physician Assistant | Admitting: Physician Assistant

## 2024-03-19 ENCOUNTER — Ambulatory Visit (INDEPENDENT_AMBULATORY_CARE_PROVIDER_SITE_OTHER): Admitting: Physician Assistant

## 2024-03-19 VITALS — BP 128/68 | HR 64 | Ht 66.0 in | Wt 133.0 lb

## 2024-03-19 DIAGNOSIS — R1030 Lower abdominal pain, unspecified: Secondary | ICD-10-CM

## 2024-03-19 DIAGNOSIS — Z860100 Personal history of colon polyps, unspecified: Secondary | ICD-10-CM

## 2024-03-19 DIAGNOSIS — K5904 Chronic idiopathic constipation: Secondary | ICD-10-CM

## 2024-03-19 DIAGNOSIS — K5909 Other constipation: Secondary | ICD-10-CM | POA: Diagnosis not present

## 2024-03-19 DIAGNOSIS — R6889 Other general symptoms and signs: Secondary | ICD-10-CM | POA: Diagnosis not present

## 2024-03-19 DIAGNOSIS — R198 Other specified symptoms and signs involving the digestive system and abdomen: Secondary | ICD-10-CM | POA: Diagnosis not present

## 2024-03-19 LAB — CBC WITH DIFFERENTIAL/PLATELET
Basophils Absolute: 0.1 K/uL (ref 0.0–0.1)
Basophils Relative: 1 % (ref 0.0–3.0)
Eosinophils Absolute: 0.2 K/uL (ref 0.0–0.7)
Eosinophils Relative: 2.7 % (ref 0.0–5.0)
HCT: 42.3 % (ref 36.0–46.0)
Hemoglobin: 14.3 g/dL (ref 12.0–15.0)
Lymphocytes Relative: 23.4 % (ref 12.0–46.0)
Lymphs Abs: 1.5 K/uL (ref 0.7–4.0)
MCHC: 33.7 g/dL (ref 30.0–36.0)
MCV: 95.6 fl (ref 78.0–100.0)
Monocytes Absolute: 0.7 K/uL (ref 0.1–1.0)
Monocytes Relative: 11.3 % (ref 3.0–12.0)
Neutro Abs: 4 K/uL (ref 1.4–7.7)
Neutrophils Relative %: 61.6 % (ref 43.0–77.0)
Platelets: 176 K/uL (ref 150.0–400.0)
RBC: 4.43 Mil/uL (ref 3.87–5.11)
RDW: 13.1 % (ref 11.5–15.5)
WBC: 6.6 K/uL (ref 4.0–10.5)

## 2024-03-19 LAB — COMPREHENSIVE METABOLIC PANEL WITH GFR
ALT: 12 U/L (ref 0–35)
AST: 19 U/L (ref 0–37)
Albumin: 4.7 g/dL (ref 3.5–5.2)
Alkaline Phosphatase: 54 U/L (ref 39–117)
BUN: 20 mg/dL (ref 6–23)
CO2: 27 meq/L (ref 19–32)
Calcium: 9.7 mg/dL (ref 8.4–10.5)
Chloride: 103 meq/L (ref 96–112)
Creatinine, Ser: 0.95 mg/dL (ref 0.40–1.20)
GFR: 56.37 mL/min — ABNORMAL LOW (ref >60.00)
Glucose, Bld: 104 mg/dL — ABNORMAL HIGH (ref 70–99)
Potassium: 3.6 meq/L (ref 3.5–5.1)
Sodium: 139 meq/L (ref 135–145)
Total Bilirubin: 0.6 mg/dL (ref 0.2–1.2)
Total Protein: 7.7 g/dL (ref 6.0–8.3)

## 2024-03-19 LAB — SEDIMENTATION RATE: Sed Rate: 6 mm/h (ref 0–30)

## 2024-03-19 MED ORDER — PEG 3350-KCL-NA BICARB-NACL 420 G PO SOLR: ORAL | 0 refills | Status: DC

## 2024-03-19 MED ORDER — PREDNISONE 50 MG PO TABS: ORAL_TABLET | ORAL | 0 refills | Status: DC

## 2024-03-19 NOTE — Patient Instructions (Signed)
 Your provider has requested that you go to the basement level for lab work before leaving today. Press B on the elevator. The lab is located at the first door on the left as you exit the elevator.  Your provider has requested that you have an abdominal x ray before leaving today. Please go to the basement floor to our Radiology department for the test.   Go to the ER if unable to pass gas, severe AB pain, unable to hold down food, any shortness of breath of chest pain.    Please send in Trilyte - take as directed- but do 6 oz every 30 mins to an hour until the impaction passes, can do 1/2 gallon on first day and 1/2 gallon on 2nd day.  Can continue 2 fleets enemas a day until it resolves.   Linzess 145 mcg samples given  DO NOT START UNTIL AFTER THE XRAY RESULT AND AFTER THE TRILYTE    Prednisone  50 mg at 13 hours prior to procedure, 7 hours prior to procedure, and 1 hour prior to procedure. Benadryl  50 mg 1 hour prior to procedure. Please get Benadryl  over the counter. A script has been sent in for Prednisone   You have been scheduled for a CT scan of the abdomen and pelvis at [facility] [place], [place], [place] [address]).   You are scheduled on 03-26-24 at 330pm. You should arrive by 115pm your appointment time for registration. Please follow the written instructions below on the day of your exam:  WARNING: IF YOU ARE ALLERGIC TO IODINE /X-RAY DYE, PLEASE NOTIFY RADIOLOGY IMMEDIATELY AT [phone]! YOU WILL BE GIVEN A 13 HOUR PREMEDICATION PREP.  1) Do not eat 1130am (4 hours prior to your test) 2) You will be given 2 bottles of oral contrast to drink on site.    Drink 1 bottle of contrast @ 130pm (2 hours prior to your exam)  Drink 1 bottle of contrast @  230pm (1 hour prior to your exam)  You may take any medications as prescribed with a small amount of water, if necessary. If you take any of the following medications: METFORMIN, GLUCOPHAGE, GLUCOVANCE, AVANDAMET,  RIOMET, FORTAMET, ACTOPLUS MET, JANUMET, GLUMETZA or METAGLIP, you MAY be asked to HOLD this medication 48 hours AFTER the exam.  The purpose of you drinking the oral contrast is to aid in the visualization of your intestinal tract. The contrast solution may cause some diarrhea. Depending on your individual set of symptoms, you may also receive an intravenous injection of x-ray contrast/dye. Plan on being at [facility] for 30 minutes or longer, depending on the type of exam you are having performed.  This test typically takes 30-45 minutes to complete.  If you have any questions regarding your exam or if you need to reschedule, you may call the CT department at [phone] between the hours of 8:00 am and 5:00 pm, Monday-Friday.  Linzess 145 mcg samples given  DO NOT START UNTIL AFTER THE XRAY RESULT AND AFTER THE TRILYTE  *IBS-C patients may begin to experience relief from belly pain and overall abdominal symptoms (pain, discomfort, and bloating) in about 1 week,  with symptoms typically improving over 12 weeks.  Take at least 30 minutes before the first meal of the day on an empty stomach You can have a loose stool if you eat a high-fat breakfast. Give it at least 7 days, may have more bowel movements during that time.   The diarrhea should go away and you should  start having normal, complete, full bowel movements.  It may be helpful to start treatment when you can be near the comfort of your own bathroom, such as a weekend.  After you are out we can send in a prescription if you did well, there is a prescription card   Toileting tips to help with your constipation - Drink at least 64-80 ounces of water/liquid per day. - Establish a time to try to move your bowels every day.  For many people, this is after a cup of coffee or after a meal such as breakfast. - Sit all of the way back on the toilet keeping your back fairly straight and while sitting up, try to rest the tops of your  forearms on your upper thighs.   - Raising your feet with a step stool/squatty potty can be helpful to improve the angle that allows your stool to pass through the rectum. - Relax the rectum feeling it bulge toward the toilet water.  If you feel your rectum raising toward your body, you are contracting rather than relaxing. - Breathe in and slowly exhale. Belly breath by expanding your belly towards your belly button. Keep belly expanded as you gently direct pressure down and back to the anus.  A low pitched GRRR sound can assist with increasing intra-abdominal pressure.  (Can also trying to blow on a pinwheel and make it move, this helps with the same belly breathing) - Repeat 3-4 times. If unsuccessful, contract the pelvic floor to restore normal tone and get off the toilet.  Avoid excessive straining. - To reduce excessive wiping by teaching your anus to normally contract, place hands on outer aspect of knees and resist knee movement outward.  Hold 5-10 second then place hands just inside of knees and resist inward movement of knees.  Hold 5 seconds.  Repeat a few times each way.  Go to the ER if unable to pass gas, severe AB pain, unable to hold down food, any shortness of breath of chest pain.  Here some information about pelvic floor dysfunction. This may be contributing to some of your symptoms. We will continue with our evaluation but I do want you to consider adding on fiber supplement with low-dose MiraLAX  daily. We could also refer to pelvic floor physical therapy.   Pelvic Floor Dysfunction, Female Pelvic floor dysfunction (PFD) is a condition that results when the group of muscles and connective tissues that support the organs in the pelvis (pelvic floor muscles) do not work well. These muscles and their connections form a sling that supports the colon and bladder. In women, they also support the uterus. PFD causes pelvic floor muscles to be too weak, too tight, or both. In PFD,  muscle movements are not coordinated. This may cause bowel or bladder problems. It may also cause pain. What are the causes? This condition may be caused by an injury to the pelvic area or by a weakening of pelvic muscles. This often results from pregnancy and childbirth or other types of strain. In many cases, the exact cause is not known. What increases the risk? The following factors may make you more likely to develop this condition: Having chronic bladder tissue inflammation (interstitial cystitis). Being an older person. Being overweight. History of radiation treatment for cancer in the pelvic region. Previous pelvic surgery, such as removal of the uterus (hysterectomy). What are the signs or symptoms? Symptoms of this condition vary and may include: Bladder symptoms, such as: Trouble starting urination and  emptying the bladder. Frequent urinary tract infections. Leaking urine when coughing, laughing, or exercising (stress incontinence). Having to pass urine urgently or frequently. Pain when passing urine. Bowel symptoms, such as: Constipation. Urgent or frequent bowel movements. Incomplete bowel movements. Painful bowel movements. Leaking stool or gas. Unexplained genital or rectal pain. Genital or rectal muscle spasms. Low back pain. Other symptoms may include: A heavy, full, or aching feeling in the vagina. A bulge that protrudes into the vagina. Pain during or after sex. How is this diagnosed? This condition may be diagnosed based on: Your symptoms and medical history. A physical exam. During the exam, your health care provider may check your pelvic muscles for tightness, spasm, pain, or weakness. This may include a rectal exam and a pelvic exam. In some cases, you may have diagnostic tests, such as: Electrical muscle function tests. Urine flow testing. X-ray tests of bowel function. Ultrasound of the pelvic organs. How is this treated? Treatment for this condition  depends on the symptoms. Treatment options include: Physical therapy. This may include Kegel exercises to help relax or strengthen the pelvic floor muscles. Biofeedback. This type of therapy provides feedback on how tight your pelvic floor muscles are so that you can learn to control them. Internal or external massage therapy. A treatment that involves electrical stimulation of the pelvic floor muscles to help control pain (transcutaneous electrical nerve stimulation, or TENS). Sound wave therapy (ultrasound) to reduce muscle spasms. Medicines, such as: Muscle relaxants. Bladder control medicines. Surgery to reconstruct or support pelvic floor muscles may be an option if other treatments do not help. Follow these instructions at home: Activity Do your usual activities as told by your health care provider. Ask your health care provider if you should modify any activities. Do pelvic floor strengthening or relaxing exercises at home as told by your physical therapist. Lifestyle Maintain a healthy weight. Eat foods that are high in fiber, such as beans, whole grains, and fresh fruits and vegetables. Limit foods that are high in fat and processed sugars, such as fried or sweet foods. Manage stress with relaxation techniques such as yoga or meditation. General instructions If you have problems with leakage: Use absorbable pads or wear padded underwear. Wash frequently with mild soap. Keep your genital and anal area as clean and dry as possible. Ask your health care provider if you should try a barrier cream to prevent skin irritation. Take warm baths to relieve pelvic muscle tension or spasms. Take over-the-counter and prescription medicines only as told by your health care provider. Keep all follow-up visits. How is this prevented? The cause of PFD is not always known, but there are a few things you can do to reduce the risk of developing this condition, including: Staying at a healthy  weight. Getting regular exercise. Managing stress. Contact a health care provider if: Your symptoms are not improving with home care. You have signs or symptoms of PFD that get worse at home. You develop new signs or symptoms. You have signs of a urinary tract infection, such as: Fever. Chills. Increased urinary frequency. A burning feeling when urinating. You have not had a bowel movement in 3 days (constipation). Summary Pelvic floor dysfunction results when the muscles and connective tissues in your pelvic floor do not work well. These muscles and their connections form a sling that supports your colon and bladder. In women, they also support the uterus. PFD may be caused by an injury to the pelvic area or by a weakening of  pelvic muscles. PFD causes pelvic floor muscles to be too weak, too tight, or a combination of both. Symptoms may vary from person to person. In most cases, PFD can be treated with physical therapies and medicines. Surgery may be an option if other treatments do not help. This information is not intended to replace advice given to you by your health care provider. Make sure you discuss any questions you have with your health care provider. Document Revised: 10/19/2020 Document Reviewed: 10/19/2020 Elsevier Patient Education  2022 [employer].

## 2024-03-20 ENCOUNTER — Ambulatory Visit: Payer: Self-pay | Admitting: Physician Assistant

## 2024-03-20 DIAGNOSIS — K5904 Chronic idiopathic constipation: Secondary | ICD-10-CM

## 2024-03-25 NOTE — Progress Notes (Signed)
 Remote ICD Transmission

## 2024-03-26 ENCOUNTER — Ambulatory Visit (HOSPITAL_COMMUNITY)
Admission: RE | Admit: 2024-03-26 | Discharge: 2024-03-26 | Disposition: A | Discharge status: Home / Self Care | Source: Ambulatory Visit | Attending: Physician Assistant | Admitting: Physician Assistant

## 2024-03-26 DIAGNOSIS — R1030 Lower abdominal pain, unspecified: Secondary | ICD-10-CM | POA: Insufficient documentation

## 2024-03-26 DIAGNOSIS — K5730 Diverticulosis of large intestine without perforation or abscess without bleeding: Secondary | ICD-10-CM | POA: Diagnosis not present

## 2024-03-26 DIAGNOSIS — K439 Ventral hernia without obstruction or gangrene: Secondary | ICD-10-CM | POA: Diagnosis not present

## 2024-03-26 DIAGNOSIS — K8020 Calculus of gallbladder without cholecystitis without obstruction: Secondary | ICD-10-CM | POA: Diagnosis not present

## 2024-03-26 DIAGNOSIS — K5900 Constipation, unspecified: Secondary | ICD-10-CM | POA: Diagnosis not present

## 2024-03-26 MED ORDER — IOHEXOL 300 MG/ML  SOLN
100.0000 mL | Freq: Once | INTRAMUSCULAR | Status: AC | PRN
  Administered 2024-03-26: 100 mL via INTRAVENOUS

## 2024-03-26 MED ORDER — BARIUM SULFATE 2 % PO SUSP
900.0000 mL | Freq: Once | ORAL | Status: AC
  Administered 2024-03-26: 900 mL via ORAL

## 2024-04-01 DIAGNOSIS — Z23 Encounter for immunization: Secondary | ICD-10-CM | POA: Diagnosis not present

## 2024-04-09 ENCOUNTER — Ambulatory Visit (HOSPITAL_COMMUNITY)
Admission: RE | Admit: 2024-04-09 | Discharge: 2024-04-09 | Disposition: A | Discharge status: Home / Self Care | Source: Ambulatory Visit | Attending: Internal Medicine | Admitting: Internal Medicine

## 2024-04-09 ENCOUNTER — Encounter (HOSPITAL_COMMUNITY): Payer: Self-pay | Admitting: Internal Medicine

## 2024-04-09 VITALS — BP 148/68 | HR 71 | Ht 66.0 in | Wt 133.2 lb

## 2024-04-09 DIAGNOSIS — Z95810 Presence of automatic (implantable) cardiac defibrillator: Secondary | ICD-10-CM

## 2024-04-09 DIAGNOSIS — Z7902 Long term (current) use of antithrombotics/antiplatelets: Secondary | ICD-10-CM | POA: Insufficient documentation

## 2024-04-09 DIAGNOSIS — I2510 Atherosclerotic heart disease of native coronary artery without angina pectoris: Secondary | ICD-10-CM

## 2024-04-09 DIAGNOSIS — E785 Hyperlipidemia, unspecified: Secondary | ICD-10-CM | POA: Insufficient documentation

## 2024-04-09 DIAGNOSIS — Z4502 Encounter for adjustment and management of automatic implantable cardiac defibrillator: Secondary | ICD-10-CM | POA: Insufficient documentation

## 2024-04-09 DIAGNOSIS — I272 Pulmonary hypertension, unspecified: Secondary | ICD-10-CM | POA: Diagnosis not present

## 2024-04-09 DIAGNOSIS — Z955 Presence of coronary angioplasty implant and graft: Secondary | ICD-10-CM | POA: Insufficient documentation

## 2024-04-09 DIAGNOSIS — I480 Paroxysmal atrial fibrillation: Secondary | ICD-10-CM | POA: Diagnosis not present

## 2024-04-09 DIAGNOSIS — Z951 Presence of aortocoronary bypass graft: Secondary | ICD-10-CM | POA: Diagnosis not present

## 2024-04-09 DIAGNOSIS — I252 Old myocardial infarction: Secondary | ICD-10-CM | POA: Insufficient documentation

## 2024-04-09 DIAGNOSIS — F319 Bipolar disorder, unspecified: Secondary | ICD-10-CM | POA: Diagnosis not present

## 2024-04-09 DIAGNOSIS — I5022 Chronic systolic (congestive) heart failure: Secondary | ICD-10-CM | POA: Diagnosis not present

## 2024-04-09 DIAGNOSIS — M8580 Other specified disorders of bone density and structure, unspecified site: Secondary | ICD-10-CM | POA: Diagnosis not present

## 2024-04-09 DIAGNOSIS — I5084 End stage heart failure: Secondary | ICD-10-CM | POA: Insufficient documentation

## 2024-04-09 DIAGNOSIS — I110 Hypertensive heart disease with heart failure: Secondary | ICD-10-CM | POA: Diagnosis not present

## 2024-04-09 DIAGNOSIS — I5042 Chronic combined systolic (congestive) and diastolic (congestive) heart failure: Secondary | ICD-10-CM | POA: Insufficient documentation

## 2024-04-09 DIAGNOSIS — I447 Left bundle-branch block, unspecified: Secondary | ICD-10-CM | POA: Diagnosis not present

## 2024-04-09 DIAGNOSIS — R252 Cramp and spasm: Secondary | ICD-10-CM | POA: Diagnosis not present

## 2024-04-09 DIAGNOSIS — Z7984 Long term (current) use of oral hypoglycemic drugs: Secondary | ICD-10-CM | POA: Diagnosis not present

## 2024-04-09 DIAGNOSIS — Z79899 Other long term (current) drug therapy: Secondary | ICD-10-CM | POA: Diagnosis not present

## 2024-04-09 DIAGNOSIS — I10 Essential (primary) hypertension: Secondary | ICD-10-CM | POA: Diagnosis not present

## 2024-04-09 MED ORDER — NITROGLYCERIN 0.4 MG SL SUBL: 0.4000 mg | SUBLINGUAL_TABLET | SUBLINGUAL | 1 refills | Status: AC | PRN

## 2024-04-09 MED ORDER — SPIRONOLACTONE 25 MG PO TABS: 25.0000 mg | ORAL_TABLET | Freq: Every day | ORAL | 3 refills | Status: AC

## 2024-04-09 NOTE — Patient Instructions (Signed)
 Medication Changes:  INCREASE SPIRONOLACTONE  TO 25MG  ONCE DAILY   Lab Work:  Engineer, technical sales FOR LABS IN 1 WEEK AS SCHEDULED   Follow-Up in: 6 MONTHS WITH DR. CHERRIE PLEASE CALL OUR OFFICE AROUND FEBRUARY TO GET SCHEDULED FOR YOUR APPOINTMENT. PHONE NUMBER IS [phone] OPTION 2   At the [facility], you and your health needs are our priority. We have a designated team specialized in the treatment of Heart Failure. This Care Team includes your primary Heart Failure Specialized Cardiologist (physician), Advanced Practice Providers (APPs- Physician Assistants and Nurse Practitioners), and Pharmacist who all work together to provide you with the care you need, when you need it.   You may see any of the following providers on your designated Care Team at your next follow up:  Dr. Toribio CHERRIE Dr. Ezra Shuck Dr. Ria Commander Dr. Odis Brownie Greig Mosses, NP Caffie Shed, [place] [facility] [place], [place] Beckey Coe, NP [place] Lee, NP Tinnie Redman, PharmD   Please be sure to bring in all your medications bottles to every appointment.   Need to Contact Us :  If you have any questions or concerns before your next appointment please send us  a message through [place] or call our office at [phone].    TO LEAVE A MESSAGE FOR THE NURSE SELECT OPTION 2, PLEASE LEAVE A MESSAGE INCLUDING: YOUR NAME DATE OF BIRTH CALL BACK NUMBER REASON FOR CALL**this is important as we prioritize the call backs  YOU WILL RECEIVE A CALL BACK THE SAME DAY AS LONG AS YOU CALL BEFORE 4:00 PM

## 2024-04-09 NOTE — Progress Notes (Addendum)
 [facility]  NOTE  Referring Physician: Randol Dawes, MD Primary Care: Randol Dawes, MD Primary Cardiologist: Dr. Hobart  HPI:  Claudia Howell is a 83 y.o. 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 Hobart for further evalaution of her HF.    Last cath 04/2022 with patent LIMA to LAD, patent RCA with patent RCA stents, patent Lcx stent. Stable anatomy since 2020. RHC with EF <25%, severe pulmonary HTN with PASP , PVR 3.49 WU, PCWP . TTE 03/2022 with EF 20-25%, G3DD, akinesis of the basal-mid inferior, distal lateral apical walls, hypokinesis elsewhere, G3DD, normal RV, mild MR.  She was previously followed by Dr. Claudene and Dr. Hobart  I saw her for first time in 4/24 and had end-stage HF symptoms. Entresto  and carvedilol  cut back. Underwent CRT-D 4/24   Echo 02/19/23: EF 25-30%   Echo 08/26/23 EF 33% Personally reviewed  Here for f/u, Continues to feel better after CRT. Chasing around 5 great grandchildren. Doing exercise class a few times per week. Feels good no CP or undue SOB. Mild dizziness when standing up occasionally. BP 120/60s   ICD interrogation:No VT/AF. Fluid very low 100% BiV pacing Activity level 6-8h/day Personally reviewed .    Past Medical History:  Diagnosis Date   Adenomatous colon polyp 03/2008   CHF (congestive heart failure) (HCC)    Constipation    Coronary artery disease    Last cath 12/14 Cath 12/8 100% SVG occlusion to RCA, 95% SVG.  The stenosis to OM.  LIMA to LAD patent but diffuse disease in LAD after graft.  Overlapping stents mid/distal SVG to OM.   Decubitus ulcer of coccyx 10/22/2012   1/2 inch raw open area on coccyx   Hyperlipidemia    Hypertension    Dr.Henry Claudene   Myocardial infarction [facility])    Osteopenia 02/2008   PAF  (paroxysmal atrial fibrillation) (HCC)    during cath/notes 10/16/2012   Right posterior capsular opacification 11/16/2019   S/P CABG x 3 10/22/2012   LIMA to LAD, SVG to distal RCA, SVG to OM, EVH from right thigh   S/P Maze operation for atrial fibrillation 10/22/2012   Left side lesion set using bipolar radiofrequency and cryothermy ablation with clipping of LA appendage   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     carvedilol  (COREG ) 3.125 MG tablet TAKE 2 TABLETS(6.25 MG) BY MOUTH TWICE DAILY 180 tablet 3   cholecalciferol  (VITAMIN D ) 1000 UNITS tablet Take 1,000 Units by mouth in the morning.     clopidogrel  (PLAVIX ) 75 MG tablet Take 1 tablet (75 mg total) by mouth daily. 90 tablet 3   Coenzyme Q10 (COQ10 PO) Take 1 capsule by mouth in the morning.     dapagliflozin  propanediol (FARXIGA ) 10 MG TABS tablet Take 1 tablet (10 mg total) by mouth daily before breakfast. 90 tablet 3   furosemide  (LASIX ) 20 MG tablet Take 20  mg by mouth every other day.     MAGNESIUM  PO Take 1 tablet by mouth in the morning.     Multiple Vitamin (MULTIVITAMIN) tablet Take 1 tablet by mouth in the morning.     nitroGLYCERIN  (NITROSTAT ) 0.4 MG SL tablet Place 0.4 mg under the tongue as needed.     polyethylene glycol-electrolytes (NULYTELY ) 420 g solution do 6 oz every 30 mins until the impaction passes, can do 1/2 gallon on first day and 1/2 gallon on 2nd day.  Can continue 2 fleets enemas a day until it resolves. 4000 mL 0   Potassium 99 MG TABS Take 3 tablets by mouth in the morning.     predniSONE  (DELTASONE ) 50 MG tablet Give 13 hours, 7 hours, and 1 hour prior to contrast dye  injection. Notify pharmacy to schedule dose as appropriate based on procedure date/time. 3 tablet 0   sacubitril -valsartan  (ENTRESTO ) 24-26 MG TAKE 1 TABLET BY MOUTH TWICE DAILY 60 tablet 6   Sennosides (SENOKOT PO) Take 8 tablets by mouth daily.     spironolactone  (ALDACTONE ) 25 MG tablet Take 0.5 tablets (12.5 mg total) by mouth daily. 45 tablet 3   Current Facility-Administered Medications  Medication Dose Route Frequency Provider Last Rate Last Admin   sodium chloride  flush (NS) 0.9 % injection 3 mL  3 mL Intravenous Q12H Wyn Jackee VEAR Mickey., NP        Allergies  Allergen Reactions   Contrast Media [Iodinated Contrast Media] Hives   Other Nausea Only   Pravastatin Other (See Comments)    muscle aches   Simvastatin Other (See Comments)    Muscle cramps   Tramadol  Hcl Nausea And Vomiting   Zetia  [Ezetimibe ] Other (See Comments)    Muscle cramps   Oxycodone  Nausea And Vomiting and Other (See Comments)    Extreme nausea and vomiting   Repatha  [Evolocumab ] Other (See Comments)    Muscle cramps      Social History   Socioeconomic History   Marital status: Widowed    Spouse name: Not on file   Number of children: 2   Years of education: 14   Highest education level: Associate degree: occupational, Scientist, product/process development, or vocational program  Occupational History   Occupation: retired    Comment: LPN  Tobacco Use   Smoking status: Never   Smokeless tobacco: Never  Vaping Use   Vaping status: Never Used  Substance and Sexual Activity   Alcohol use: Yes    Alcohol/week: 2.0 standard drinks of alcohol    Types: 2 Glasses of wine per week    Comment: 1-2/month   Drug use: No   Sexual activity: Not Currently  Other Topics Concern   Not on file  Social History Narrative   Widowed.  Lives alone.     Retired Public house manager.     1 son in [place], 1 son in [place]. 7 grandchildren, total of 5 great grandchildren (3 girls, 2 boys; 1 was adopted from [place], Down's syndrome) all in [place].   Updated  05/2023   Social Drivers of Health   Financial Resource Strain: Low Risk  (05/26/2019)   Overall Financial Resource Strain (CARDIA)    Difficulty of Paying Living Expenses: Not hard at all  Food Insecurity: No Food Insecurity (10/09/2022)   Hunger Vital Sign    Worried About Running Out of Food in the Last Year: Never true    Ran Out of Food in the Last Year: Never true  Transportation Needs: No Transportation Needs (  10/09/2022)   PRAPARE - Administrator, Civil Service (Medical): No    Lack of Transportation (Non-Medical): No  Physical Activity: Insufficiently Active (05/26/2019)   Exercise Vital Sign    Days of Exercise per Week: 5 days    Minutes of Exercise per Session: 20 min  Stress: No Stress Concern Present (05/26/2019)   [employer] of Occupational Health - Occupational Stress Questionnaire    Feeling of Stress : Not at all  Social Connections: Unknown (05/26/2019)   Social Connection and Isolation Panel    Frequency of Communication with Friends and Family: Not asked    Frequency of Social Gatherings with Friends and Family: Not asked    Attends Religious Services: Not on file    Active Member of Clubs or Organizations: Not on file    Attends Banker Meetings: Not asked    Marital Status: Widowed  Catering manager Violence: Not on file      Family History  Problem Relation Age of Onset   Hypertension Mother    Heart attack Mother 106   Hypertension Brother    Cancer Brother        prostate (76), gall bladder (dx 83)   Heart disease Brother 77       stent   Prostate cancer Brother    Heart disease Other        x2 (age 52 and 59)   Diabetes Neg Hx    Breast cancer Neg Hx    Colon cancer Neg Hx    Stomach cancer Neg Hx    Rectal cancer Neg Hx    Liver cancer Neg Hx    Pancreatic cancer Neg Hx    Esophageal cancer Neg Hx     Vitals:   04/09/24 0853  BP: (!) 148/68  Pulse: 71  SpO2: 99%  Weight: 60.4 kg (133 lb 3.2 oz)  Height:  5' 6 (1.676 m)      PHYSICAL EXAM: General:  Well appearing. No resp difficulty HEENT: normal Neck: supple. no JVD. Carotids 2+ bilat; no bruits. No lymphadenopathy or thryomegaly appreciated. Cor: PMI nondisplaced. Regular rate & rhythm. No rubs, gallops or murmurs. Lungs: clear Abdomen: soft, nontender, nondistended. No hepatosplenomegaly. No bruits or masses. Good bowel sounds. Extremities: no cyanosis, clubbing, rash, edema Neuro: alert & orientedx3, cranial nerves grossly intact. moves all 4 extremities w/o difficulty. Affect pleasant    ASSESSMENT & PLAN:  1. Chronic systolic HF due to iCM  - Echo 10/23 wEF 20-25% with akinesis of the basal to mid septum, basal to mid inferior, distal lateral and apical walls. G3DD, mild MR - Myoview  1/23 EF 23%  - Echo 2/24 EF 20-25% - s/p CRT-D 4/24 - Echo 02/19/23: EF 25-30%  - Echo 08/26/23 EF 33% Personally reviewed - Much improved with CRT. NYHA I-II Volume ok  - Continue lasix  20mg  every other day. - Continue carvedilol  to 3.125 mg BID (unable to tolerate higher doses due to fatigue and low BP) - Continue farxiga  10mg  daily - Continue entresto  24/26mg  BID (unable to tolerate higher doses due to fatigue and low BP) - increase spiro 12.5 -> 25 check BMET 1-2 weeks    2. CAD s/p CABG with subsequent PCI: - s/p CABG 2014 (Dr. Dusty)  - LHC 04/2022 with patent LIMA to LAD, patent RCA stents, patent Lcx stent. LVEF severely depressed 20-25% as detailed above. - No s/s angina - LDL 87 (not at goal) Intolerant of repatha , crestor  and zetia . We  bumped Lipitor at last visit. She will rechec Lipids with PCP. Dose limited by leg cramps  - Continue Plavix  75 daily  3.Paroxysmal Afib: - S/p MAZE with no known recurrence. Not on AC. - No AF on device   4. LBBB - s/p CRT-D in 4/24 - doing well as above - Device interrogated in clinic as above   Toribio Fuel, MD  9:25 AM

## 2024-04-10 ENCOUNTER — Other Ambulatory Visit: Payer: Self-pay | Admitting: Nurse Practitioner

## 2024-04-16 ENCOUNTER — Ambulatory Visit (HOSPITAL_COMMUNITY)
Admission: RE | Admit: 2024-04-16 | Discharge: 2024-04-16 | Disposition: A | Discharge status: Home / Self Care | Source: Ambulatory Visit | Attending: Cardiology | Admitting: Cardiology

## 2024-04-16 DIAGNOSIS — I5022 Chronic systolic (congestive) heart failure: Secondary | ICD-10-CM | POA: Diagnosis not present

## 2024-04-16 LAB — BASIC METABOLIC PANEL WITH GFR
Anion gap: 9 (ref 5–15)
BUN: 20 mg/dL (ref 8–23)
CO2: 24 mmol/L (ref 22–32)
Calcium: 9.3 mg/dL (ref 8.9–10.3)
Chloride: 103 mmol/L (ref 98–111)
Creatinine, Ser: 1.06 mg/dL — ABNORMAL HIGH (ref 0.44–1.00)
GFR, Estimated: 53 mL/min — ABNORMAL LOW (ref >60)
Glucose, Bld: 104 mg/dL — ABNORMAL HIGH (ref 70–99)
Potassium: 4.6 mmol/L (ref 3.5–5.1)
Sodium: 136 mmol/L (ref 135–145)

## 2024-04-16 MED ORDER — LINACLOTIDE 290 MCG PO CAPS: 290.0000 ug | ORAL_CAPSULE | Freq: Every day | ORAL | 0 refills | Status: DC

## 2024-04-16 NOTE — Telephone Encounter (Signed)
 Refer to pt message 04/14/24.

## 2024-04-16 NOTE — Telephone Encounter (Signed)
 3 boxes of Linzess 290 mcg placed at 2nd floor front desk for patient.

## 2024-04-21 ENCOUNTER — Ambulatory Visit (INDEPENDENT_AMBULATORY_CARE_PROVIDER_SITE_OTHER): Payer: Medicare Other

## 2024-04-21 DIAGNOSIS — I480 Paroxysmal atrial fibrillation: Secondary | ICD-10-CM | POA: Diagnosis not present

## 2024-04-22 LAB — CUP PACEART REMOTE DEVICE CHECK
Battery Remaining Longevity: 124 mo
Battery Voltage: 3.01 V
Brady Statistic AP VP Percent: 0.13 %
Brady Statistic AP VS Percent: 0.01 %
Brady Statistic AS VP Percent: 99.21 %
Brady Statistic AS VS Percent: 0.65 %
Brady Statistic RA Percent Paced: 0.19 %
Brady Statistic RV Percent Paced: 0 %
Date Time Interrogation Session: 20251028002609
HighPow Impedance: 70 Ohm
Implantable Lead Connection Status: 753985
Implantable Lead Connection Status: 753985
Implantable Lead Connection Status: 753985
Implantable Lead Implant Date: 20240429
Implantable Lead Implant Date: 20240429
Implantable Lead Implant Date: 20240429
Implantable Lead Location: 753859
Implantable Lead Location: 753860
Implantable Lead Location: 753860
Implantable Lead Model: 3830
Implantable Lead Model: 5076
Implantable Pulse Generator Implant Date: 20240429
Lead Channel Impedance Value: 266 Ohm
Lead Channel Impedance Value: 304 Ohm
Lead Channel Impedance Value: 418 Ohm
Lead Channel Impedance Value: 437 Ohm
Lead Channel Impedance Value: 494 Ohm
Lead Channel Impedance Value: 646 Ohm
Lead Channel Pacing Threshold Amplitude: 0.5 V
Lead Channel Pacing Threshold Pulse Width: 0.4 ms
Lead Channel Sensing Intrinsic Amplitude: 1.1 mV
Lead Channel Sensing Intrinsic Amplitude: 8.1 mV
Lead Channel Setting Pacing Amplitude: 1 V
Lead Channel Setting Pacing Amplitude: 2.5 V
Lead Channel Setting Pacing Pulse Width: 0.4 ms
Lead Channel Setting Sensing Sensitivity: 0.3 mV
Zone Setting Status: 755011
Zone Setting Status: 755011
Zone Setting Status: 755011

## 2024-04-23 ENCOUNTER — Encounter: Payer: Self-pay | Admitting: Internal Medicine

## 2024-04-28 NOTE — Progress Notes (Signed)
 Remote ICD Transmission

## 2024-04-29 ENCOUNTER — Other Ambulatory Visit: Payer: Self-pay | Admitting: Physician Assistant

## 2024-04-29 DIAGNOSIS — K5904 Chronic idiopathic constipation: Secondary | ICD-10-CM

## 2024-04-29 MED ORDER — LUBIPROSTONE 24 MCG PO CAPS: 24.0000 ug | ORAL_CAPSULE | Freq: Two times a day (BID) | ORAL | 0 refills | Status: DC

## 2024-04-29 NOTE — Telephone Encounter (Signed)
 Patient has failed Linzess 145 and 290. Will do a trial of Amitiza 24 mcg twice daily, if patient fails this can consider trial of Motegrity or Isbrela.

## 2024-04-29 NOTE — Addendum Note (Signed)
 Addended by: CRAIG PALMA on: 04/29/2024 11:43 AM   Modules accepted: Orders

## 2024-04-30 ENCOUNTER — Ambulatory Visit: Payer: Self-pay | Admitting: Cardiovascular Disease

## 2024-05-08 ENCOUNTER — Ambulatory Visit: Admitting: Gastroenterology

## 2024-05-08 ENCOUNTER — Encounter: Payer: Self-pay | Admitting: Gastroenterology

## 2024-05-08 VITALS — BP 120/60 | HR 88 | Ht 63.25 in | Wt 129.2 lb

## 2024-05-08 DIAGNOSIS — K624 Stenosis of anus and rectum: Secondary | ICD-10-CM

## 2024-05-08 DIAGNOSIS — K5909 Other constipation: Secondary | ICD-10-CM | POA: Diagnosis not present

## 2024-05-08 MED ORDER — POLYETHYLENE GLYCOL 3350 17 G PO PACK: 17.0000 g | PACK | Freq: Two times a day (BID) | ORAL | Status: AC

## 2024-05-08 MED ORDER — TRULANCE 3 MG PO TABS: 3.0000 mg | ORAL_TABLET | Freq: Every day | ORAL | 0 refills | Status: DC

## 2024-05-08 NOTE — Progress Notes (Signed)
 03/19/2024 Claudia Howell [id number] 04-29-1941   Referring provider: Randol Dawes, MD Primary GI doctor: Dr. Charlanne   ASSESSMENT AND PLAN:  Constipation Lifelong constipation but worse last 2 weeks 2 dulcolax and 10 senokot was not helping, tried MOM, mag citrate, miralax  purge not helping.  Passing gas, small volume liquid stool every 3-5 days Has had urethral prolapse, s/p TAH and BSO 30 years ago Possible rectal stenosis, could not proceed with rectal exam, uncertain if this is stenosis, from prolapse or other but patient is passing gas which is reassuring - KUB STAT for obstruction - Get CT AB and pelvis with contrast, has contrast IV allergy -will give Prednisone  50 mg at 13 hours prior to procedure, 7 hours prior to procedure, and 1 hour prior to procedure. Benadryl  50 mg 1 hour prior to procedure.   - get CBC, CMET, ESR - Will send in Trilyte - please take 6 oz every 30 mins until the impaction passes, can do 1/2 gallon on first day and 1/2 gallon on 2nd day.  - if KUB shows no obstruction can try linzess 145 mcg samples given -ER precautions given to the patient.  - patient high risk for endoscopic evaluation, may need to consider flex sig versus referral to GYN pending CT results   Personal history of advanced polyps 12/06/2021 colonoscopy for advanced polyps good bowel prep 2 TA polyps 4 to 6 mm cecum moderate sigmoid diverticulosis nonbleeding internal hemorrhoids normal TI, no recall secondary to age   CAD status post CABG 2014 On Plavix    HFrEF status post AICD 04/27/2022 LHC/RHC EF less than 25% severe pulmonary hypertension PA mean 47 mmHg graphs open 08/26/2023 echo ejection fraction 33%, unremarkable valves On Entresto  and Farxiga    History of atrial fibrillation status post maze Not on anticoagulation   Patient Care Team: Randol Dawes, MD as PCP - General (Family Medicine) [name] ORN, MD (Inactive) as PCP - Cardiology (Cardiology) Mealor, Eulas BRAVO, MD as PCP -  Electrophysiology (Cardiology) Lita Lye, OD as Referring Physician (Optometry) Elner, Arley DELENA, MD as Consulting Physician (Ophthalmology) Joshua Blamer, MD as Consulting Physician (Dermatology) Regal, Pasco RAMAN, DPM as Consulting Physician (Podiatry) Dr. Curtistine Prader, DDS as Consulting Physician (Dentistry) Charlanne Groom, MD as Consulting Physician (Gastroenterology)   HISTORY OF PRESENT ILLNESS: 83 y.o. female with a past medical history listed below presents for evaluation of worsening constipation.    Patient last seen in the office 11/08/2021 by Dr. Charlanne for history of advanced polyps.   Discussed the use of AI scribe software for clinical note transcription with the patient, who gave verbal consent to proceed.   History of Present Illness   Claudia Howell is an 83 year old female with chronic constipation who presents with worsening constipation over the past two weeks.   She has a lifelong history of constipation, which has significantly worsened over the past two weeks. Her bowel movements are described as 'little dribbles' and mostly liquid, with minimal stool passage despite the use of various laxatives. She has been using a regimen of Duopa and Senokot, escalating to Dulcolax, Senokot, milk of magnesia, and citrate of magnesia without significant relief. She has also increased her intake of fruits and vegetables.   No abdominal pain, nausea, vomiting, bloating, or weight loss, although she has gained three pounds since the onset of her symptoms. No blood in stool. She is able to pass gas, but the stool output is minimal and 'laughable.'   Her current medications  include Entresto  and Farxiga  for her heart, which she has been on for over a year, and she occasionally forgets to take spironolactone . She also takes potassium and magnesium  for cramps, which have not changed recently.   She reports that her urethra has 'dropped' for the past two to three years, which was evaluated by  her regular doctor and managed with Kegel exercises. Recently, she has experienced urinary hesitancy and effort in urination over the past week. She also reports episodes of fecal incontinence, which have kept her homebound in the past.   She underwent a colonoscopy in May 2023, which revealed a few polyps, diverticulosis, and hemorrhoids, but was otherwise unremarkable. There was no plan for recall unless symptoms developed.    She  reports that she has never smoked. She has never used smokeless tobacco. She reports current alcohol use of about 2.0 standard drinks of alcohol per week. She reports that she does not use drugs.   RELEVANT GI HISTORY, IMAGING AND LABS: Results   DIAGNOSTIC Colonoscopy: Good bowel preparation, presence of polyps, diverticulosis, and hemorrhoids (10/2021) Rectal Exam: Tight anal sphincter, presence of rectal skin tag posteriorly, unable to complete due to tightness (03/19/2024)       CBC Labs (Brief)          Component Value Date/Time    WBC 7.1 06/04/2023 0845    WBC 13.3 (H) 04/30/2019 0307    RBC 4.32 06/04/2023 0845    RBC 3.21 (L) 04/30/2019 0307    HGB 13.6 06/04/2023 0845    HCT 42.7 06/04/2023 0845    PLT 165 06/04/2023 0845    MCV 99 (H) 06/04/2023 0845    MCH 31.5 06/04/2023 0845    MCH 31.5 04/30/2019 0307    MCHC 31.9 06/04/2023 0845    MCHC 33.6 04/30/2019 0307    RDW 12.2 06/04/2023 0845    LYMPHSABS 1.4 06/04/2023 0845    MONOABS 0.7 04/24/2019 1023    EOSABS 0.2 06/04/2023 0845    BASOSABS 0.1 06/04/2023 0845      Recent Labs (within last 365 days)     Recent Labs    06/04/23 0845  HGB 13.6        CMP     Labs (Brief)          Component Value Date/Time    NA 140 03/09/2024 1651    K 3.7 03/09/2024 1651    CL 103 03/09/2024 1651    CO2 22 03/09/2024 1651    GLUCOSE 104 (H) 03/09/2024 1651    GLUCOSE 112 (H) 02/01/2023 1144    BUN 18 03/09/2024 1651    CREATININE 0.91 03/09/2024 1651    CREATININE 1.06 (H)  09/06/2016 1146    CALCIUM  9.5 03/09/2024 1651    PROT 7.4 03/09/2024 1651    ALBUMIN  4.7 03/09/2024 1651    AST 19 03/09/2024 1651    ALT 13 03/09/2024 1651    ALKPHOS 65 03/09/2024 1651    BILITOT 0.5 03/09/2024 1651    GFRNONAA 57 (L) 02/01/2023 1144    GFRAA 75 05/09/2020 1558          Latest Ref Rng & Units 03/09/2024    4:51 PM 06/04/2023    8:45 AM 05/23/2022    3:09 PM  Hepatic Function  Total Protein 6.0 - 8.5 g/dL 7.4  7.2  7.2   Albumin  3.7 - 4.7 g/dL 4.7  4.3  4.6   AST 0 - 40 IU/L 19  23  16  ALT 0 - 32 IU/L 13  12  7    Alk Phosphatase 48 - 129 IU/L 65  66  57   Total Bilirubin 0.0 - 1.2 mg/dL 0.5  0.5  0.4   Bilirubin, Direct 0.00 - 0.40 mg/dL     9.81       Current Medications:    Current Outpatient Medications (Endocrine & Metabolic):    dapagliflozin  propanediol (FARXIGA ) 10 MG TABS tablet, Take 1 tablet (10 mg total) by mouth daily before breakfast.   predniSONE  (DELTASONE ) 50 MG tablet, Give 13 hours, 7 hours, and 1 hour prior to contrast dye injection. Notify pharmacy to schedule dose as appropriate based on procedure date/time.     Current Outpatient Medications (Cardiovascular):    atorvastatin  (LIPITOR) 10 MG tablet, TAKE 1 TABLET BY MOUTH EVERY OTHER DAY IN THE EVENING ALTERNATING WITH 2 TABLET EVERY OTHER DAY IN THE EVENING   carvedilol  (COREG ) 3.125 MG tablet, TAKE 2 TABLETS(6.25 MG) BY MOUTH TWICE DAILY   furosemide  (LASIX ) 20 MG tablet, Take 20 mg by mouth every other day.   nitroGLYCERIN  (NITROSTAT ) 0.4 MG SL tablet, Place 0.4 mg under the tongue as needed.   sacubitril -valsartan  (ENTRESTO ) 24-26 MG, TAKE 1 TABLET BY MOUTH TWICE DAILY   spironolactone  (ALDACTONE ) 25 MG tablet, Take 0.5 tablets (12.5 mg total) by mouth daily.             Current Outpatient Medications (Hematological):    clopidogrel  (PLAVIX ) 75 MG tablet, Take 1 tablet (75 mg total) by mouth daily.     Current Outpatient Medications (Other):    bisacodyl  (DULCOLAX) 5 MG  EC tablet, Take 5 mg by mouth daily as needed for severe constipation.   cholecalciferol  (VITAMIN D ) 1000 UNITS tablet, Take 1,000 Units by mouth in the morning.   Coenzyme Q10 (COQ10 PO), Take 1 capsule by mouth in the morning.   MAGNESIUM  PO, Take 1 tablet by mouth in the morning.   Multiple Vitamin (MULTIVITAMIN) tablet, Take 1 tablet by mouth in the morning.   polyethylene glycol-electrolytes (NULYTELY ) 420 g solution, do 6 oz every 30 mins until the impaction passes, can do 1/2 gallon on first day and 1/2 gallon on 2nd day.  Can continue 2 fleets enemas a day until it resolves.   Potassium 99 MG TABS, Take 3 tablets by mouth in the morning.   Sennosides (SENOKOT PO), Take 8 tablets by mouth daily.   Current Facility-Administered Medications (Other):    sodium chloride  flush (NS) 0.9 % injection 3 mL   Medical History:      Past Medical History:  Diagnosis Date   Adenomatous colon polyp 03/2008   CHF (congestive heart failure) (HCC)     Constipation     Coronary artery disease      Last cath 12/14 Cath 12/8 100% SVG occlusion to RCA, 95% SVG.  The stenosis to OM.  LIMA to LAD patent but diffuse disease in LAD after graft.  Overlapping stents mid/distal SVG to OM.   Decubitus ulcer of coccyx 10/22/2012    1/2 inch raw open area on coccyx   Hyperlipidemia     Hypertension      Dr.Henry Claudene   Myocardial infarction [facility])     Osteopenia 02/2008   PAF (paroxysmal atrial fibrillation) (HCC)      during cath/notes 10/16/2012   Right posterior capsular opacification 11/16/2019   S/P CABG x 3 10/22/2012    LIMA to LAD, SVG to distal RCA, SVG to OM, [facility]  from right thigh   S/P Maze operation for atrial fibrillation 10/22/2012    Left side lesion set using bipolar radiofrequency and cryothermy ablation with clipping of LA appendage   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        Allergies:  Allergies       Allergies  Allergen Reactions   Contrast Media [Iodinated Contrast  Media] Hives   Other Nausea Only   Pravastatin Other (See Comments)      muscle aches   Simvastatin Other (See Comments)      Muscle cramps   Tramadol  Hcl Nausea And Vomiting   Zetia  [Ezetimibe ] Other (See Comments)      Muscle cramps   Oxycodone  Nausea And Vomiting and Other (See Comments)      Extreme nausea and vomiting   Repatha  [Evolocumab ] Other (See Comments)      Muscle cramps        Surgical History:  She  has a past surgical history that includes Total abdominal hysterectomy w/ bilateral salpingoophorectomy (1990); Tonsillectomy (1949); MAZE (N/A, 10/22/2012); Intraoprative transesophageal echocardiogram (N/A, 10/22/2012); Cardiac catheterization (10/16/2012); Coronary angioplasty with stent (04/01/2013); Coronary artery bypass graft (N/A, 10/22/2012); Dilation and curettage of uterus; left heart catheterization with coronary angiogram (N/A, 10/16/2012); left heart catheterization with coronary/graft angiogram (N/A, 06/01/2013); percutaneous coronary stent intervention (pci-s) (06/01/2013); Cataract extraction, bilateral (Bilateral); Breast cyst excision (Left, 8019-8009'd); Cataract extraction, bilateral (Bilateral, approx 2016); LEFT HEART CATH AND CORS/GRAFTS ANGIOGRAPHY (N/A, 01/01/2019); CORONARY STENT INTERVENTION (N/A, 01/01/2019); LEFT HEART CATH AND CORS/GRAFTS ANGIOGRAPHY (N/A, 04/28/2019); CORONARY CTO INTERVENTION (N/A, 04/29/2019); CORONARY ATHERECTOMY (N/A, 04/29/2019); RIGHT/LEFT HEART CATH AND CORONARY/GRAFT ANGIOGRAPHY (N/A, 05/01/2022); and BIV ICD INSERTION CRT-D (N/A, 10/22/2022). Family History:  Her family history includes Cancer in her brother; Heart attack (age of onset: 69) in her mother; Heart disease in an other family member; Heart disease (age of onset: 47) in her brother; Hypertension in her brother and mother; Prostate cancer in her brother.   REVIEW OF SYSTEMS  : All other systems reviewed and negative except where noted in the History of Present Illness.   PHYSICAL  EXAM: BP 128/68   Pulse 64   Ht 5' 6 (1.676 m)   Wt 133 lb (60.3 kg)   SpO2 96%   BMI 21.47 kg/m  Physical Exam   GENERAL APPEARANCE: Well nourished, in no apparent distress. HEENT: No cervical lymphadenopathy, unremarkable thyroid , sclerae anicteric, conjunctiva pink. RESPIRATORY: Respiratory effort normal, breath sounds equal bilaterally without rales, rhonchi, or wheezing. CARDIO: Regular rate and rhythm with no murmurs, rubs, or gallops, peripheral pulses intact. ABDOMEN: Soft, non-distended, active bowel sounds in all four quadrants, slight tenderness to palpation LLQ, no rebound, no mass appreciated but there is a fullness lower AB. Non tender umbilical hernia.  RECTAL: Rectal skin tag posteriorly, rectum tight and tender. Unable to perform internal exam. No discharge, no obvious blood.  MUSCULOSKELETAL: Full range of motion, normal gait, without edema. SKIN: Dry, intact without rashes or lesions. No jaundice. NEURO: Alert, oriented, no focal deficits. PSYCH: Cooperative, normal mood and affect. EXTREMITIES: No swelling in legs.       Alan JONELLE Coombs, PA-C     Attending physician's note   I have taken history, reviewed the chart and examined the patient. I performed a substantive portion of this encounter, including complete performance of at least one of the key components, in conjunction with the APP. I agree with the Advanced Practitioner's note, impression and recommendations.   CT AP 03/26/2024 IMPRESSION: Colonic  diverticulosis, without radiographic evidence of diverticulitis or other acute findings. Congenital small bowel malrotation. No evidence of volvulus or other complication. Cholelithiasis. No radiographic evidence of cholecystitis. Tiny epigastric ventral hernia, which contains only fat. 5 mm indeterminate right lower lobe pulmonary nodule   Rectal exam today (in presence of Pam): anal stenosis -just able to introduce small finger, no masses.   Heme-negative.    IMP: Anorectal stenosis with outlet obstruction. No prev anorectal sx. Neg colon for any anorectal masses 11/2021.  At the time of colonoscopy minor stenosis was noted Chronic constipation- failed linzess, amitiza H/O polyps - colon 11/2021. No need to rpt d/t age.   Plan: -Increase miralaz 17g po BID -Try trulance 3mg  po every day.  Samples given -Ref to colorectal surgery for EUA/anorectal dilation under anesthesia   Anselm Bring, MD [facility] [phone]

## 2024-05-08 NOTE — Patient Instructions (Addendum)
 Please purchase the following medications over the counter and take as directed: Increase Miralax  to twice a day  We have given you samples of the following medication to take: Trulance 3 mg: Take once daily Stop Linzess and Amitiza  We are referring you to [facility].  They will contact you directly to schedule an appointment.  It may take a week or more before you hear from them.  Please feel free to contact us  if you have not heard from them within 2 weeks and we will follow up on the referral.  [facility] ([facility]) is located at [address]. Their number is [phone].   Thank you for trusting me with your gastrointestinal care!      _______________________________________________________  If your blood pressure at your visit was 140/90 or greater, please contact your primary care physician to follow up on this.  _______________________________________________________  If you are age 35 or older, your body mass index should be between 23-30. Your Body mass index is 22.71 kg/m. If this is out of the aforementioned range listed, please consider follow up with your Primary Care Provider.  If you are age 5 or younger, your body mass index should be between 19-25. Your Body mass index is 22.71 kg/m. If this is out of the aformentioned range listed, please consider follow up with your Primary Care Provider.   ________________________________________________________  The [facility] providers would like to encourage you to use MYCHART to communicate with providers for non-urgent requests or questions.  Due to long hold times on the telephone, sending your provider a message by [facility] may be a faster and more efficient way to get a response.  Please allow 48 business hours for a response.  Please remember that this is for non-urgent requests.  _______________________________________________________  [facility] is using a  team-based approach to care.  Your team is made up of your doctor and two to three APPS. Our APPS (Nurse Practitioners and Physician Assistants) work with your physician to ensure care continuity for you. They are fully qualified to address your health concerns and develop a treatment plan. They communicate directly with your gastroenterologist to care for you. Seeing the Advanced Practice Practitioners on your physician's team can help you by facilitating care more promptly, often allowing for earlier appointments, access to diagnostic testing, procedures, and other specialty referrals.

## 2024-05-11 ENCOUNTER — Other Ambulatory Visit (HOSPITAL_COMMUNITY): Payer: Self-pay

## 2024-05-12 ENCOUNTER — Other Ambulatory Visit (HOSPITAL_COMMUNITY): Payer: Self-pay

## 2024-05-12 MED ORDER — CARVEDILOL 3.125 MG PO TABS: 3.1250 mg | ORAL_TABLET | Freq: Two times a day (BID) | ORAL | 3 refills | Status: AC

## 2024-05-27 NOTE — Progress Notes (Unsigned)
 No chief complaint on file.   Claudia Howell is a 83 y.o. female who presents for annual wellness visit and follow-up on chronic medical conditions.    She was last seen by me in September with complaints of worsening constipation, despite using excessive laxatives.  She followed up with GI. Last visit with Dr. Charlanne was a few weeks ago. She has anorectal stenosis with outlet obstruction.  She has chronic constipation, having failed linzess , amitiza .  He recommended increasing Miralax  to BID and adding Trulance  3 mg daily.  She was referred to colorectal surgery for EUA/anorectal dilation under anesthesia. She is scheduled for 07/01/24.  CAD and CHF:  She has history of CAD with bypass graft (January/2014), saphenous vein graft stent (10/2012), and unstable angina treated 04/28/2019 with R coronary artery CTO PCI.  She continues on plavix  and lipitor. She also has hypertension, h/o paroxysmal atrial fibrillation, s/p Maze procedure. Denies any recurrent atrial fibrillation. Denies chest pain, palpitations, tachycardia, edema.  She is now under the care of Dr. Bensimhon for CHF (chronic systolic HF due to ischemic cardiomyopathy), last seen in October 2025. She underwent CRT-D in 09/2022, biventricular ICD placed.  Device is monitored regularly. Last echo 08/2023 EF 33%. Doing well on farxiga , Lasix  20 mg M/W/F, carvedilol  BID (can only tolerate 3.125 mg dose), Entresto  (24/26mg  BID). Digoxin  was stopped in 05/2023. Spironolactone  dose was increased from 12.5mg  to 25 mg at recent visit.  She has R carotid bruit.  Last carotid US  was 05/2023, showing 50%-69% stenosis. She is compliant with statin, plavix . She denies any neuro symptoms.   Hyperlipidemia:  intolerance to zetia  and limited tolerance to statins. She didn't tolerate Repatha .  She is currently on atorvastatin  10mg . Dr. Bensimhon had increased dose from 10 mg daily, to alternating 20 mg and 10 mg every other day, as LDL was 87 in 05/2023, goal  is <70.  Due for recheck of lipids today.  She continues to follow a low cholesterol diet.  She eats red meat once a month.  1 egg yolk/week. No butter. Lowfat cheese. No creamy dressings/sauces/soups, or mayo. She eats a lot of fruit and vegetables.  Lab Results  Component Value Date   CHOL 149 06/04/2023   HDL 41 06/04/2023   LDLCALC 87 06/04/2023   TRIG 114 06/04/2023   CHOLHDL 3.6 06/04/2023   Osteopenia:  Worsening osteopenia has been noted, with T -2.3 at L forearm/radius on most recent DEXA in 11/2023. We had previously recommended recheck of vitamin D , but she declined due to Medicare possibly not covering the cost.  Last level was 35 in 2014.   H/o adenomatous colon polyps: Colonoscopy in 10/2014 had tubular adenomas, rec 3 year repeat. This was delayed due to MI and being on aspirin  and Plavix .  She had f/u colonoscopy in June 2023-- tubular adenomas were present, but due to age, stated that no routine colonoscopy follow-up was needed. Melanosis coli was noted. See above for constipation and anorectal stenosis with outlet obstruction.    Immunization History  Administered Date(s) Administered    sv, Bivalent, Protein Subunit Rsvpref,pf (Abrysvo) 05/30/2022   Fluad Quad(high Dose 65+) 03/25/2019, 03/22/2020, 04/05/2021, 04/17/2022   Fluad Trivalent(High Dose 65+) 04/02/2023   INFLUENZA, HIGH DOSE SEASONAL PF 04/12/2011, 04/22/2013, 03/25/2014, 04/08/2015, 04/13/2016, 03/27/2017, 05/08/2018, 03/09/2024   Influenza Split 05/07/2012   Influenza-Unspecified 04/12/2011   PFIZER(Purple Top)SARS-COV-2 Vaccination 07/08/2019, 07/29/2019, 04/06/2020   PNEUMOCOCCAL CONJUGATE-20 05/29/2023   Pfizer Covid-19 Vaccine Bivalent Booster 39yrs & up 04/19/2021  Pfizer(Comirnaty)Fall Seasonal Vaccine 12 years and older 03/12/2022, 04/16/2023, 04/01/2024   Pneumococcal Conjugate-13 09/09/2014   Pneumococcal Polysaccharide-23 06/30/2009, 05/09/2020   Td 06/25/2002   Td (Adult) 06/25/2002    Tdap 01/23/2009, 07/28/2020   Zoster Recombinant(Shingrix) 03/14/2017, 05/24/2017   Zoster, Live 01/24/2008   Last Pap smear: n/a (hysterectomy for benign reasons) Last mammogram: 09/2023 Last colonoscopy: 11/2021 with Dr. Bonnell tubular adenomas, mild melanosis coli, moderate sigmoid diverticulosis, non-bleeding internal hemorrhoids.  No f/u recommended based on age. Last DEXA: 11/2023 T-1.2 L fem neck. -1.0 R fem neck, -1.2 total R hip, -2.3 L forearm/radius. Normal FRAX Dentist: twice yearly Ophtho: sees retinal specialist yearly  Exercise:   limited by CHF--energy has improved, this year she has been able to vacuum her house.  Walking is limited to her ADL's.  Vitamin D -OH level of 35 in 2014.    End of Life Discussion:  Patient has a living will and medical power of attorney, not in her chart   PMH, PSH, SH and FH were reviewed and updated    ROS: The patient denies anorexia, fever, headaches, ear pain, sore throat, breast concerns, palpitations, dizziness, syncope, dyspnea on exertion, cough, swelling, nausea, vomiting, diarrhea, constipation, abdominal pain, melena, hematochezia, indigestion/heartburn, hematuria, incontinence, dysuria, vaginal bleeding, discharge, odor or itch, genital lesions, joint pains, weakness, tremor, suspicious skin lesions, depression, anxiety, abnormal bleeding/bruising, or enlarged lymph nodes.  Constipation per HPI. Denies heartburn, indigestion. Fatigue is improving  Chronic R ankle pain.  Higher shoes help. R lateral hip pain. Sees chiropractor for this, and it helps. Goes monthly.   PHYSICAL EXAM:  There were no vitals taken for this visit.   Wt Readings from Last 3 Encounters:  05/08/24 129 lb 4 oz (58.6 kg)  04/09/24 133 lb 3.2 oz (60.4 kg)  03/19/24 133 lb (60.3 kg)    General Appearance:   Alert, cooperative, no distress, appears stated age    Head:   Normocephalic, without obvious abnormality, atraumatic    Eyes:   PERRL,  conjunctiva/corneas clear, EOM's intact, fundi benign    Ears:   Normal external ear canals. Cerumen L>R limiting visibility of TM's ***  Nose:   No drainage or sinus tenderness  Throat:   Normal mucosa  Neck:   Supple, no lymphadenopathy; thyroid : no enlargement/tenderness/ nodules. R carotid bruit noted.  Back:   Spine nontender, no curvature, ROM normal, no CVA tenderness    Lungs:   Clear to auscultation bilaterally without wheezes, respirations unlabored. Trace crackles at left base ***  Chest Wall:   No tenderness or deformity. WHSS. Defibrillator in upper left chest.  Heart:   Regular rate and rhythm, S1 and S2 normal, no murmur, rub or gallop    Breast Exam:   No tenderness, masses, or nipple discharge or inversion. No axillary lymphadenopathy.  WHSS left breast at superior aspect of areola. There is a palpable defect at 12 o'clock where tissue was removed.  No masses.  Abdomen:   Soft, non-tender, nondistended, normoactive bowel sounds, no masses, no hepatosplenomegaly. Small umbilical hernia, nontender, easily reducible.  Genitalia:   Atrophic changes noted. No lesions. With valsalva, there is some movement of bladder, with urethra palpable just inside the introitus, not extending out (mild cystocele). Uterus is surgically absent. No adnexal masses or tenderness  Rectal:   Deferred to GI.  Extremities:   No clubbing, cyanosis or edema.  There is a 3.5-4x 2.5-3 cm soft tissue mass/focal area of swelling at R lateral malleolus. *** There is  no erythema, warmth, nontender.   Pulses:   2+ and symmetric all extremities    Skin:   Skin color, texture, turgor normal, no rashes or lesions. some diffuse thinning noted in scalp, no rash. Actinic changes upper back, legs, hands. No bruising. Hyperkeratotic lesions on R forearm x 2 ***  Lymph nodes:   Cervical, supraclavicular, and axillary nodes normal    Neurologic:   Normal strength, sensation and gait; reflexes 2+ and symmetric throughout                          Psych:   Normal mood, affect, hygiene and grooming   ***UPDATE cerumen Crackles L base?? Update umb hernia ***UPDATE GU EXAM, mass R lat malleolus, SKIN (R forearm)  ASSESSMENT/PLAN:  ?can do pelvic today, will skip rectal Copy of living will and [facility] POA requested, not in chart   ADD CODE FOR PELVIC EXAM, NOT USED LAST YEAR  Discussed monthly self breast exams and yearly mammograms (past due, reminded to schedule); at least 30 minutes of aerobic activity at least 5 days/week and weight-bearing exercise 2x/week; proper sunscreen use reviewed; healthy diet, including goals of calcium  and vitamin D  intake and alcohol recommendations (less than or equal to 1 drink/day) reviewed; regular seatbelt use; changing batteries in smoke detectors, use of carbon monoxide detectors.  Immunization recommendations discussed--continue yearly high dose flu shots. UTD. Colonoscopy recommendations reviewed, UTD.  MOST form reviewed and unchanged She is Full Code, Full Care   Annabelle DELENA Fetters, MD

## 2024-05-27 NOTE — Patient Instructions (Incomplete)
  HEALTH MAINTENANCE RECOMMENDATIONS:  It is recommended that you get at least 30 minutes of aerobic exercise at least 5 days/week (for weight loss, you may need as much as 60-90 minutes). This can be any activity that gets your heart rate up. This can be divided in 10-15 minute intervals if needed, but try and build up your endurance at least once a week.  Weight bearing exercise is also recommended twice weekly.  Eat a healthy diet with lots of vegetables, fruits and fiber.  Colorful foods have a lot of vitamins (ie green vegetables, tomatoes, red peppers, etc).  Limit sweet tea, regular sodas and alcoholic beverages, all of which has a lot of calories and sugar.  Up to 1 alcoholic drink daily may be beneficial for women (unless trying to lose weight, watch sugars).  Drink a lot of water.  Calcium  recommendations are 1200-1500 mg daily (1500 mg for postmenopausal women or women without ovaries), and vitamin D  1000 IU daily.  This should be obtained from diet and/or supplements (vitamins), and calcium  should not be taken all at once, but in divided doses.  Monthly self breast exams and yearly mammograms for women over the age of 93 is recommended.  Sunscreen of at least SPF 30 should be used on all sun-exposed parts of the skin when outside between the hours of 10 am and 4 pm (not just when at beach or pool, but even with exercise, golf, tennis, and yard work!)  Use a sunscreen that says broad spectrum so it covers both UVA and UVB rays, and make sure to reapply every 1-2 hours.  Remember to change the batteries in your smoke detectors when changing your clock times in the spring and fall. Carbon monoxide detectors are recommended for your home.  Use your seat belt every time you are in a car, and please drive safely and not be distracted with cell phones and texting while driving.   Claudia Howell , Thank you for taking time to come for your Medicare Wellness Visit. I appreciate your ongoing  commitment to your health goals. Please review the following plan we discussed and let me know if I can assist you in the future.   This is a list of the screening recommended for you and due dates:  Health Maintenance  Topic Date Due   Medicare Annual Wellness Visit  05/28/2024   COVID-19 Vaccine (8 - Pfizer risk 2025-26 season) 09/30/2024   Breast Cancer Screening  10/09/2024   DTaP/Tdap/Td vaccine (5 - Td or Tdap) 07/28/2030   Pneumococcal Vaccine for age over 35  Completed   Flu Shot  Completed   Osteoporosis screening with Bone Density Scan  Completed   Zoster (Shingles) Vaccine  Completed   Meningitis B Vaccine  Aged Out   Colon Cancer Screening  Discontinued   Hepatitis C Screening  Discontinued   Please bring us  copies of your Living Will and Healthcare Power of Attorney so that it can be scanned into your medical chart.  We discussed trying to increase your calcium  intake to 1200-1500 mg daily, by eating more calcium  rich foods.  We discussed drinking almond milk (or soy milk) daily, as well as other sources (see handout).

## 2024-05-27 NOTE — Progress Notes (Unsigned)
 No chief complaint on file.    Subjective:   Claudia Howell is a 83 y.o. female who presents for a Medicare Annual Wellness Visit.  Fall Screening Falls in the past year?: 0 Number of falls in past year: 0 Was there an injury with Fall?: 0 Fall Risk Category Calculator: 0 Patient Fall Risk Level: Low Fall Risk  Fall Risk Patient at Risk for Falls Due to: No Fall Risks Fall risk Follow up: Falls evaluation completed  Cognitive Assessment Was the patient able to repeat memory words in 3 tries?: yes Which version was used?: Version 4 : river, nation, finger Clock numbers correct?: yes Clock time correct (11:10)?: yes (2:00) Normal clock drawing test?: 2 How many words correct?: 3 Which version was used?: Version 4: river, nation, finger Mini-Cog Scoring: 5  Advance Directives (For Healthcare) Does Patient Have a Medical Advance Directive?: Yes Does patient want to make changes to medical advance directive?: No - Patient declined Type of Advance Directive: Healthcare Power of [place]; Living will; Out of facility DNR (pink MOST or yellow form) Copy of Healthcare Power of Attorney in Chart?: No - copy requested Copy of Living Will in Chart?: No - copy requested Out of facility DNR (pink MOST or yellow form) in Chart? (Ambulatory ONLY): Yes - validated most recent copy scanned in chart    Allergies (verified) Contrast media [iodinated contrast media], Other, Pravastatin, Simvastatin, Tramadol  hcl, Zetia  [ezetimibe ], Oxycodone , and Repatha  [evolocumab ]   Current Medications (verified) Outpatient Encounter Medications as of 05/28/2024  Medication Sig   atorvastatin  (LIPITOR) 10 MG tablet TAKE 1 TABLET(10 MG) BY MOUTH DAILY   bisacodyl  (DULCOLAX) 5 MG EC tablet Take 5 mg by mouth daily as needed for severe constipation.   carvedilol  (COREG ) 3.125 MG tablet Take 1 tablet (3.125 mg total) by mouth 2 (two) times daily with a meal.   cholecalciferol  (VITAMIN D ) 1000 UNITS tablet  Take 1,000 Units by mouth in the morning.   clopidogrel  (PLAVIX ) 75 MG tablet Take 1 tablet (75 mg total) by mouth daily.   Coenzyme Q10 (COQ10 PO) Take 1 capsule by mouth in the morning.   dapagliflozin  propanediol (FARXIGA ) 10 MG TABS tablet Take 1 tablet (10 mg total) by mouth daily before breakfast.   furosemide  (LASIX ) 20 MG tablet Take 20 mg by mouth every other day.   MAGNESIUM  PO Take 1 tablet by mouth in the morning.   Multiple Vitamin (MULTIVITAMIN) tablet Take 1 tablet by mouth in the morning.   nitroGLYCERIN  (NITROSTAT ) 0.4 MG SL tablet Place 1 tablet (0.4 mg total) under the tongue as needed.   Plecanatide  (TRULANCE ) 3 MG TABS Take 1 tablet (3 mg total) by mouth daily. Lot: [id number], exp:  01-2025   polyethylene glycol (MIRALAX ) 17 g packet Take 17 g by mouth 2 (two) times daily.   Potassium 99 MG TABS Take 3 tablets by mouth in the morning.   sacubitril -valsartan  (ENTRESTO ) 24-26 MG TAKE 1 TABLET BY MOUTH TWICE DAILY   Sennosides (SENOKOT PO) Take 8 tablets by mouth daily.   spironolactone  (ALDACTONE ) 25 MG tablet Take 1 tablet (25 mg total) by mouth daily.   Facility-Administered Encounter Medications as of 05/28/2024  Medication   sodium chloride  flush (NS) 0.9 % injection 3 mL    History: Past Medical History:  Diagnosis Date   Adenomatous colon polyp 03/2008   CHF (congestive heart failure) (HCC)    Constipation    Coronary artery disease    Last cath 12/14 Cath  12/8 100% SVG occlusion to RCA, 95% SVG.  The stenosis to OM.  LIMA to LAD patent but diffuse disease in LAD after graft.  Overlapping stents mid/distal SVG to OM.   Decubitus ulcer of coccyx 10/22/2012   1/2 inch raw open area on coccyx   Hyperlipidemia    Hypertension    Dr.Henry Claudene   Myocardial infarction [facility])    Osteopenia 02/2008   PAF (paroxysmal atrial fibrillation) (HCC)    during cath/notes 10/16/2012   Right posterior capsular opacification 11/16/2019   S/P CABG x 3 10/22/2012   LIMA to LAD,  SVG to distal RCA, SVG to OM, EVH from right thigh   S/P Maze operation for atrial fibrillation 10/22/2012   Left side lesion set using bipolar radiofrequency and cryothermy ablation with clipping of LA appendage   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   Past Surgical History:  Procedure Laterality Date   BIV ICD INSERTION CRT-D N/A 10/22/2022   Procedure: BIV ICD INSERTION CRT-D;  Surgeon: Nancey Eulas BRAVO, MD;  Location: [facility] INVASIVE CV LAB;  Service: Cardiovascular;  Laterality: N/A;   BREAST CYST EXCISION Left 1980-1990's   x 3   CARDIAC CATHETERIZATION  10/16/2012   CATARACT EXTRACTION, BILATERAL Bilateral    03/2015 and 05/2015   CATARACT EXTRACTION, BILATERAL Bilateral approx 2016   CORONARY ANGIOPLASTY WITH STENT PLACEMENT  04/01/2013   1 (06/01/2013)   CORONARY ARTERY BYPASS GRAFT N/A 10/22/2012   Procedure: CORONARY ARTERY BYPASS GRAFTING (CABG);  Surgeon: Sudie VEAR Laine, MD;  Location: [facility] OR;  Service: Open Heart Surgery;  Laterality: N/A;   CORONARY ATHERECTOMY N/A 04/29/2019   Procedure: CORONARY ATHERECTOMY;  Surgeon: Jordan, Peter M, MD;  Location: [facility] INVASIVE CV LAB;  Service: Cardiovascular;  Laterality: N/A;   CORONARY CTO INTERVENTION N/A 04/29/2019   Procedure: CORONARY CTO INTERVENTION;  Surgeon: Jordan, Peter M, MD;  Location: [facility] INVASIVE CV LAB;  Service: Cardiovascular;  Laterality: N/A;   CORONARY STENT INTERVENTION N/A 01/01/2019   Procedure: CORONARY STENT INTERVENTION;  Surgeon: [name] ORN, MD;  Location: [facility] INVASIVE CV LAB;  Service: Cardiovascular;  Laterality: N/A;   DILATION AND CURETTAGE OF UTERUS     INTRAOPERATIVE TRANSESOPHAGEAL ECHOCARDIOGRAM N/A 10/22/2012   Procedure: INTRAOPERATIVE TRANSESOPHAGEAL ECHOCARDIOGRAM;  Surgeon: Sudie VEAR Laine, MD;  Location: [facility] OR;  Service: Open Heart Surgery;  Laterality: N/A;   LEFT HEART CATH AND CORS/GRAFTS ANGIOGRAPHY N/A 01/01/2019   Procedure: LEFT HEART CATH AND CORS/GRAFTS ANGIOGRAPHY;  Surgeon: [name] ORN, MD;  Location: [facility] INVASIVE CV LAB;  Service: Cardiovascular;  Laterality: N/A;   LEFT HEART CATH AND CORS/GRAFTS ANGIOGRAPHY N/A 04/28/2019   Procedure: LEFT HEART CATH AND CORS/GRAFTS ANGIOGRAPHY;  Surgeon: [name] ORN, MD;  Location: [facility] INVASIVE CV LAB;  Service: Cardiovascular;  Laterality: N/A;   LEFT HEART CATHETERIZATION WITH CORONARY ANGIOGRAM N/A 10/16/2012   Procedure: LEFT HEART CATHETERIZATION WITH CORONARY ANGIOGRAM;  Surgeon: Victory ORN Claudene DOUGLAS, MD;  Location: [facility] CATH LAB;  Service: Cardiovascular;  Laterality: N/A;   LEFT HEART CATHETERIZATION WITH CORONARY/GRAFT ANGIOGRAM N/A 06/01/2013   Procedure: LEFT HEART CATHETERIZATION WITH EL BILE;  Surgeon: Victory ORN Claudene DOUGLAS, MD;  Location: [facility] CATH LAB;  Service: Cardiovascular;  Laterality: N/A;   MAZE N/A 10/22/2012   Procedure: MAZE;  Surgeon: Sudie VEAR Laine, MD;  Location: [facility] OR;  Service: Open Heart Surgery;  Laterality: N/A;   PERCUTANEOUS CORONARY STENT INTERVENTION (PCI-S)  06/01/2013   Procedure: PERCUTANEOUS CORONARY STENT INTERVENTION (PCI-S);  Surgeon:  Victory LELON Claudene DOUGLAS, MD;  Location: [facility] CATH LAB;  Service: Cardiovascular;;   RIGHT/LEFT HEART CATH AND CORONARY/GRAFT ANGIOGRAPHY N/A 05/01/2022   Procedure: RIGHT/LEFT HEART CATH AND CORONARY/GRAFT ANGIOGRAPHY;  Surgeon: [name] LELON, MD;  Location: [facility] INVASIVE CV LAB;  Service: Cardiovascular;  Laterality: N/A;   TONSILLECTOMY  1949   TOTAL ABDOMINAL HYSTERECTOMY W/ BILATERAL SALPINGOOPHORECTOMY  1990   benign growth   Family History  Problem Relation Age of Onset   Hypertension Mother    Heart attack Mother 7   Hypertension Brother    Cancer Brother        prostate (76), gall bladder (dx 52)   Heart disease Brother 61       stent   Prostate cancer Brother    Heart disease Other        x2 (age 50 and 20)   Diabetes Neg Hx    Breast cancer Neg Hx    Colon cancer Neg Hx    Stomach cancer Neg Hx    Rectal cancer Neg Hx    Liver cancer Neg Hx     Pancreatic cancer Neg Hx    Esophageal cancer Neg Hx    Social History   Occupational History   Occupation: retired    Comment: LPN  Tobacco Use   Smoking status: Never   Smokeless tobacco: Never  Vaping Use   Vaping status: Never Used  Substance and Sexual Activity   Alcohol use: Yes    Alcohol/week: 2.0 standard drinks of alcohol    Types: 2 Glasses of wine per week    Comment: 1-2/month   Drug use: No   Sexual activity: Not Currently   Tobacco Counseling Counseling given: Not Answered  SDOH Screenings   Food Insecurity: No Food Insecurity (10/09/2022)  Housing: Low Risk  (10/09/2022)  Transportation Needs: No Transportation Needs (10/09/2022)  Utilities: Not At Risk (10/09/2022)  Depression (PHQ2-9): Low Risk  (05/29/2023)  Financial Resource Strain: Low Risk  (05/26/2019)  Physical Activity: Insufficiently Active (05/26/2019)  Social Connections: Unknown (05/26/2019)  Stress: No Stress Concern Present (05/26/2019)  Tobacco Use: Low Risk  (05/08/2024)   See flowsheets for full screening details  Depression Screen PHQ 2 & 9 Depression Scale- Over the past 2 weeks, how often have you been bothered by any of the following problems? Little interest or pleasure in doing things: 0 Feeling down, depressed, or hopeless (PHQ Adolescent also includes...irritable): 0 PHQ-2 Total Score: 0     Goals Addressed   None          Objective:    There were no vitals filed for this visit. There is no height or weight on file to calculate BMI.  Hearing/Vision screen No results found. Immunizations and Health Maintenance Health Maintenance  Topic Date Due   Medicare Annual Wellness (AWV)  05/28/2024   COVID-19 Vaccine (8 - Pfizer risk 2025-26 season) 09/30/2024   Mammogram  10/09/2024   DTaP/Tdap/Td (5 - Td or Tdap) 07/28/2030   Pneumococcal Vaccine: 50+ Years  Completed   Influenza Vaccine  Completed   Bone Density Scan  Completed   Zoster Vaccines- Shingrix  Completed    Meningococcal B Vaccine  Aged Out   Colonoscopy  Discontinued   Hepatitis C Screening  Discontinued        Assessment/Plan:  This is a routine wellness examination for [place].  Patient Care Team: Randol Dawes, MD as PCP - General (Family Medicine) [name] LELON, MD (Inactive) as PCP - Cardiology (  Cardiology) Mealor, Eulas BRAVO, MD as PCP - Electrophysiology (Cardiology) Lita Lye, OD as Referring Physician (Optometry) Elner, Arley LABOR, MD as Consulting Physician (Ophthalmology) Joshua Blamer, MD as Consulting Physician (Dermatology) Magdalen Pasco RAMAN, DPM as Consulting Physician (Podiatry) Dr. Curtistine Prader, DDS as Consulting Physician (Dentistry) Charlanne Groom, MD as Consulting Physician (Gastroenterology)  I have personally reviewed and noted the following in the patient's chart:   Medical and social history Use of alcohol, tobacco or illicit drugs  Current medications and supplements including opioid prescriptions. Functional ability and status Nutritional status Physical activity Advanced directives List of other physicians Hospitalizations, surgeries, and ER visits in previous 12 months Vitals Screenings to include cognitive, depression, and falls Referrals and appointments  No orders of the defined types were placed in this encounter.  In addition, I have reviewed and discussed with patient certain preventive protocols, quality metrics, and best practice recommendations. A written personalized care plan for preventive services as well as general preventive health recommendations were provided to patient.   Annabelle LABOR Fetters, MD   05/27/2024   No follow-ups on file.  After Visit Summary: {CHL AMB AWV After Visit Summary:[phone]}  Nurse Notes: ***

## 2024-05-28 ENCOUNTER — Encounter: Payer: Self-pay | Admitting: Family Medicine

## 2024-05-28 ENCOUNTER — Ambulatory Visit: Payer: Medicare Other | Admitting: Family Medicine

## 2024-05-28 VITALS — BP 132/74 | HR 64 | Ht 64.0 in | Wt 127.0 lb

## 2024-05-28 DIAGNOSIS — Z95810 Presence of automatic (implantable) cardiac defibrillator: Secondary | ICD-10-CM | POA: Diagnosis not present

## 2024-05-28 DIAGNOSIS — M8589 Other specified disorders of bone density and structure, multiple sites: Secondary | ICD-10-CM

## 2024-05-28 DIAGNOSIS — I6521 Occlusion and stenosis of right carotid artery: Secondary | ICD-10-CM

## 2024-05-28 DIAGNOSIS — I25719 Atherosclerosis of autologous vein coronary artery bypass graft(s) with unspecified angina pectoris: Secondary | ICD-10-CM | POA: Diagnosis not present

## 2024-05-28 DIAGNOSIS — E559 Vitamin D deficiency, unspecified: Secondary | ICD-10-CM | POA: Diagnosis not present

## 2024-05-28 DIAGNOSIS — E7800 Pure hypercholesterolemia, unspecified: Secondary | ICD-10-CM

## 2024-05-28 DIAGNOSIS — N8110 Cystocele, unspecified: Secondary | ICD-10-CM

## 2024-05-28 DIAGNOSIS — Z5181 Encounter for therapeutic drug level monitoring: Secondary | ICD-10-CM | POA: Diagnosis not present

## 2024-05-28 DIAGNOSIS — K5900 Constipation, unspecified: Secondary | ICD-10-CM | POA: Diagnosis not present

## 2024-05-28 DIAGNOSIS — I10 Essential (primary) hypertension: Secondary | ICD-10-CM | POA: Diagnosis not present

## 2024-05-28 DIAGNOSIS — I5022 Chronic systolic (congestive) heart failure: Secondary | ICD-10-CM

## 2024-05-28 DIAGNOSIS — Z0000 Encounter for general adult medical examination without abnormal findings: Secondary | ICD-10-CM

## 2024-05-28 DIAGNOSIS — K624 Stenosis of anus and rectum: Secondary | ICD-10-CM

## 2024-05-28 DIAGNOSIS — I255 Ischemic cardiomyopathy: Secondary | ICD-10-CM

## 2024-05-28 LAB — LIPID PANEL

## 2024-05-28 MED ORDER — TRULANCE 3 MG PO TABS: 3.0000 mg | ORAL_TABLET | Freq: Every day | ORAL | 1 refills | Status: AC

## 2024-05-29 ENCOUNTER — Encounter: Payer: Self-pay | Admitting: Family Medicine

## 2024-05-29 ENCOUNTER — Telehealth: Payer: Self-pay

## 2024-05-29 ENCOUNTER — Ambulatory Visit: Payer: Self-pay | Admitting: Family Medicine

## 2024-05-29 ENCOUNTER — Other Ambulatory Visit (HOSPITAL_COMMUNITY): Payer: Self-pay

## 2024-05-29 LAB — CBC WITH DIFFERENTIAL/PLATELET
Basophils Absolute: 0.1 x10E3/uL (ref 0.0–0.2)
Basos: 1 %
EOS (ABSOLUTE): 0.2 x10E3/uL (ref 0.0–0.4)
Eos: 3 %
Hematocrit: 45.3 % (ref 34.0–46.6)
Hemoglobin: 15.1 g/dL (ref 11.1–15.9)
Immature Grans (Abs): 0 x10E3/uL (ref 0.0–0.1)
Immature Granulocytes: 0 %
Lymphocytes Absolute: 1.7 x10E3/uL (ref 0.7–3.1)
Lymphs: 24 %
MCH: 32.3 pg (ref 26.6–33.0)
MCHC: 33.3 g/dL (ref 31.5–35.7)
MCV: 97 fL (ref 79–97)
Monocytes Absolute: 0.7 x10E3/uL (ref 0.1–0.9)
Monocytes: 9 %
Neutrophils Absolute: 4.5 x10E3/uL (ref 1.4–7.0)
Neutrophils: 63 %
Platelets: 175 x10E3/uL (ref 150–450)
RBC: 4.68 x10E6/uL (ref 3.77–5.28)
RDW: 11.8 % (ref 11.7–15.4)
WBC: 7.2 x10E3/uL (ref 3.4–10.8)

## 2024-05-29 LAB — CMP14+EGFR
ALT: 13 IU/L (ref 0–32)
AST: 21 IU/L (ref 0–40)
Albumin: 4.7 g/dL (ref 3.7–4.7)
Alkaline Phosphatase: 59 IU/L (ref 48–129)
BUN/Creatinine Ratio: 14 (ref 12–28)
BUN: 16 mg/dL (ref 8–27)
Bilirubin Total: 0.6 mg/dL (ref 0.0–1.2)
CO2: 22 mmol/L (ref 20–29)
Calcium: 9.9 mg/dL (ref 8.7–10.3)
Chloride: 99 mmol/L (ref 96–106)
Creatinine, Ser: 1.11 mg/dL — AB (ref 0.57–1.00)
Globulin, Total: 2.4 g/dL (ref 1.5–4.5)
Glucose: 89 mg/dL (ref 70–99)
Potassium: 4.3 mmol/L (ref 3.5–5.2)
Sodium: 135 mmol/L (ref 134–144)
Total Protein: 7.1 g/dL (ref 6.0–8.5)
eGFR: 50 mL/min/1.73 — AB (ref >59)

## 2024-05-29 LAB — MAGNESIUM: Magnesium: 2.5 mg/dL — AB (ref 1.6–2.3)

## 2024-05-29 LAB — LIPID PANEL
Cholesterol, Total: 170 mg/dL (ref 100–199)
HDL: 50 mg/dL (ref >39)
LDL CALC COMMENT:: 3.4 ratio (ref 0.0–4.4)
LDL Chol Calc (NIH): 94 mg/dL (ref 0–99)
Triglycerides: 149 mg/dL (ref 0–149)
VLDL Cholesterol Cal: 26 mg/dL (ref 5–40)

## 2024-05-29 LAB — VITAMIN D 25 HYDROXY (VIT D DEFICIENCY, FRACTURES): Vit D, 25-Hydroxy: 52 ng/mL (ref 30.0–100.0)

## 2024-05-29 NOTE — Telephone Encounter (Signed)
 They are referring to a PA?  Copied from CRM #[id number]. Topic: Clinical - Medication Question >> May 29, 2024 12:04 PM [place] MATSU wrote: Reason for CRM: [facility] Medicare called in requesting to know does patient have Ideal pathic Chronic Constipation if not then what is the known cause.   CB: [phone] option 5   May you please advise.

## 2024-05-29 NOTE — Telephone Encounter (Signed)
 Erminio calling from [facility] is calling to report that the PA has been approved on this medication. Please advise [phone] option 5

## 2024-05-29 NOTE — Telephone Encounter (Signed)
 Yes p/a is approved   Pharmacy Patient Advocate Encounter  Received notification from [facility] that Prior Authorization for Trulance  3MG  tablets has been APPROVED  to 12.5.26. Ran test claim, . This test claim was processed through [facility]- copay amounts may vary at other pharmacies due to pharmacy/plan contracts, or as the patient moves through the different stages of their insurance plan.

## 2024-05-29 NOTE — Telephone Encounter (Signed)
 Pharmacy Patient Advocate Encounter   Received notification from Onbase that prior authorization for (TRULANCE ) 3 MG TABS  is required/requested.   Insurance verification completed.   The patient is insured through [facility].   Per test claim: PA required; PA submitted to above mentioned insurance via Latent Key/confirmation #/EOC [id number] Status is pending

## 2024-06-01 ENCOUNTER — Other Ambulatory Visit: Payer: Self-pay | Admitting: *Deleted

## 2024-06-01 ENCOUNTER — Encounter: Payer: Self-pay | Admitting: Family Medicine

## 2024-06-01 DIAGNOSIS — E7800 Pure hypercholesterolemia, unspecified: Secondary | ICD-10-CM

## 2024-06-01 NOTE — Telephone Encounter (Signed)
 Called [facility], there is 6 months sitting there from cardiology NP/PA for #135 w/refill. They will fill and she can pick up-she is aware.

## 2024-07-01 ENCOUNTER — Other Ambulatory Visit (HOSPITAL_COMMUNITY): Payer: Self-pay | Admitting: Internal Medicine

## 2024-07-01 ENCOUNTER — Other Ambulatory Visit: Payer: Self-pay | Admitting: Family Medicine

## 2024-07-01 DIAGNOSIS — K5900 Constipation, unspecified: Secondary | ICD-10-CM

## 2024-07-01 NOTE — Telephone Encounter (Signed)
 Copied from CRM #[id number]. Topic: Clinical - Medication Refill >> Jul 01, 2024  3:31 PM Donna BRAVO wrote: Medication: Plecanatide  (TRULANCE ) 3 MG TABS   Patient stated Dr Randol was waiting to see if patient GI provider would prescribe it. GI provider stated they will not prescribe the medication   Has the patient contacted their pharmacy? No (Agent: If no, request that the patient contact the pharmacy for the refill. If patient does not wish to contact the pharmacy document the reason why and proceed with request.) (Agent: If yes, when and what did the pharmacy advise?)  This is the patient's preferred pharmacy:  [id number] - [place], [place] - [address] AT [facility] OF [address] [place] [address] Phone: [phone] Fax: [phone]  Is this the correct pharmacy for this prescription? Yes If no, delete pharmacy and type the correct one.   Has the prescription been filled recently? Yes  Is the patient out of the medication? Yes 1 night left  Has the patient been seen for an appointment in the last year OR does the patient have an upcoming appointment? Yes  Can we respond through MyChart? Yes  Agent: Please be advised that Rx refills may take up to 3 business days. We ask that you follow-up with your pharmacy.

## 2024-07-02 ENCOUNTER — Telehealth (HOSPITAL_BASED_OUTPATIENT_CLINIC_OR_DEPARTMENT_OTHER): Payer: Self-pay | Admitting: *Deleted

## 2024-07-02 NOTE — Telephone Encounter (Signed)
 I sent this in for #30 with 1 refill on 12/4. She should have a refill left. Also, she was supposed to see colorectal surgeon yesterday--not sure if she went. I don't feel comfortable prescribing this medication long-term for her, without her having f/u with GI (and ensuring that her outlet obstruction is being treated).

## 2024-07-02 NOTE — Telephone Encounter (Signed)
 Spoke with patient and she saw colorectal surgeon yesterday and he said he would prefer PCP or whomever rx'd it originally to prescribe it. She is having the surgery but the surgeon wants to speak with Dr. Cherrie first.

## 2024-07-02 NOTE — Telephone Encounter (Signed)
"  ° °  Pre-operative Risk Assessment    Patient Name: Claudia Howell  DOB: 04/18/1941 MRN: [id number]   Date of last office visit: 04/09/24 DR. CHERRIE Date of next office visit: NONE   Request for Surgical Clearance    Procedure:  ANORECTAL EXAM WITH POSSIBLE ANAL Bx and DILATION OF ANAL STRICTURE SURGERY  Date of Surgery:  Clearance TBD                                Surgeon:  DR. LONNI WHITE Surgeon's Group or Practice Name:  [facility]  Phone number:  [phone] Fax number:  [phone] COLETA MOLT, CMA   Type of Clearance Requested:   - Medical  - Pharmacy:  Hold Clopidogrel  (Plavix )     Type of Anesthesia:  MAC   Additional requests/questions:    Bonney Niels Jest   07/02/2024, 9:11 AM   "

## 2024-07-03 NOTE — Telephone Encounter (Signed)
"  ° °  Patient Name: Claudia Howell  DOB: 01/09/1941 MRN: [id number]  Primary Cardiologist: Victory LELON Claudene DOUGLAS, MD (Inactive)  Chart reviewed as part of pre-operative protocol coverage. Given past medical history and time since last visit, based on ACC/AHA guidelines, Agam A Sippel is at acceptable risk for the planned procedure without further cardiovascular testing.   Per Dr. Bensimhon, [facility] to proceed (low risk) can hold plavix .  Patient may hold Plavix  for 5 days prior to procedure.  Please resume Plavix  as soon as possible postprocedure, the discretion of the surgeon.  I will route this recommendation to the requesting party via Epic fax function and remove from pre-op pool.  Please call with questions.  Damien JAYSON Braver, NP 07/03/2024, 7:08 AM  "

## 2024-07-06 NOTE — Telephone Encounter (Signed)
 Patient advised and she picked up rx yesterday.

## 2024-07-08 ENCOUNTER — Ambulatory Visit: Payer: Self-pay | Admitting: Surgery

## 2024-07-08 ENCOUNTER — Encounter: Payer: Self-pay | Admitting: Cardiovascular Disease

## 2024-07-08 DIAGNOSIS — Z01818 Encounter for other preprocedural examination: Secondary | ICD-10-CM

## 2024-07-08 NOTE — Progress Notes (Signed)
 Please place orders for PAT appointment scheduled 07/09/24.

## 2024-07-08 NOTE — Progress Notes (Addendum)
 Date of COVID positive in last 90 days:  PCP - Eve Knapp,MD Cardiologist - Bettyjane,  MD Electrophysiologist- Eulas Furbish, MD  Cardiac clearance by Damien Braver, NP 07/04/23 in Epic  Chest x-ray - N/A EKG - 07/09/24 Epic/chart Stress Test - 04/11/22 Epic ECHO - 08/21/22 Epic Cardiac Cath - 05/01/22 Epic ICD device last checked: 04/21/24 Epic- orders in Epic Spinal Cord Stimulator:N/A  Bowel Prep - N/A  Sleep Study - N/A CPAP -   Fasting Blood Sugar - N/A Checks Blood Sugar _____ times a day  Last dose of GLP1 agonist-  N/A GLP1 instructions:  Do not take after     Last dose of SGLT-2 inhibitors-  N/A SGLT-2 instructions:  Do not take after     Blood Thinner Instructions: Plavix , hold 5 days Aspirin  Instructions:N/A Last Dose: 07/09/24 0730  Activity level: Can go up a flight of stairs and perform activities of daily living without stopping and without symptoms of chest pain or shortness of breath.   Anesthesia review: HTN, a fib, CAD, CHF, NSTEMI, CABGx3  Patient denies shortness of breath, fever, cough and chest pain at PAT appointment  Patient verbalized understanding of instructions that were given to them at the PAT appointment. Patient was also instructed that they will need to review over the PAT instructions again at home before surgery.

## 2024-07-08 NOTE — Progress Notes (Signed)
 PERIOPERATIVE PRESCRIPTION FOR IMPLANTED CARDIAC DEVICE PROGRAMMING  Patient Information: Name:  Claudia Howell  DOB:  [birth year]  MRN:  [id number]    Planned Procedure:  anorectal exam under anesthesia   Surgeon:  Dr. Teresa  Date of Procedure:  07/15/24  Cautery will be used.  Position during surgery:  unknown   Please send documentation back to:  Darryle Law (Fax # [phone])   Device Information:  Clinic EP Physician:  Dr. Eulas Furbish   Device Type:  Defibrillator Manufacturer and Phone #:  [employer]tronic: [id number] Pacemaker Dependent?:  No. Date of Last Device Check:  04/21/2024  Normal Device Function?:  Yes.    Electrophysiologist's Recommendations:  Have magnet available. Provide continuous ECG monitoring when magnet is used or reprogramming is to be performed.  Procedure should not interfere with device function.  No device programming or magnet placement needed.  Per Device Clinic Standing Orders, [name], RN  11:07 AM 07/08/2024

## 2024-07-08 NOTE — Patient Instructions (Addendum)
 SURGICAL WAITING ROOM VISITATION  Patients having surgery or a procedure may have no more than 2 support people in the waiting area - these visitors may rotate.    Children ages 52 and under will not be able to visit patients in [facility] under most circumstances.   Visitors with respiratory illnesses are discouraged from visiting and should remain at home.  If the patient needs to stay at the hospital during part of their recovery, the visitor guidelines for inpatient rooms apply. Pre-op nurse will coordinate an appropriate time for 1 support person to accompany patient in pre-op.  This support person may not rotate.    Please refer to the [facility] website for the visitor guidelines for Inpatients (after your surgery is over and you are in a regular room).    Your procedure is scheduled on: 07/15/24   Report to [facility] Main Entrance    Report to admitting at 12:20 PM   Call this number if you have problems the morning of surgery [phone]   Do not eat food  or drink liquids :After Midnight.         If you have questions, please contact your surgeons office.   FOLLOW BOWEL PREP AND ANY ADDITIONAL PRE OP INSTRUCTIONS YOU RECEIVED FROM YOUR SURGEON'S OFFICE!!!     Oral Hygiene is also important to reduce your risk of infection.                                    Remember - BRUSH YOUR TEETH THE MORNING OF SURGERY WITH YOUR REGULAR TOOTHPASTE  DENTURES WILL BE REMOVED PRIOR TO SURGERY PLEASE DO NOT APPLY Poly grip OR ADHESIVES!!!    Stop all vitamins and herbal supplements 7 days before surgery.   Take these medicines the morning of surgery with A SIP OF WATER: Carvedilol                                You may not have any metal on your body including hair pins, jewelry, and body piercing             Do not wear make-up, lotions, powders, perfumes, or deodorant  Do not wear nail polish including gel and S&S, artificial/acrylic nails, or any other  type of covering on natural nails including finger and toenails. If you have artificial nails, gel coating, etc. that needs to be removed by a nail salon please have this removed prior to surgery or surgery may need to be canceled/ delayed if the surgeon/ anesthesia feels like they are unable to be safely monitored.   Do not shave  48 hours prior to surgery.    Do not bring valuables to the hospital. [facility] IS NOT             RESPONSIBLE   FOR VALUABLES.   Contacts, glasses, dentures or bridgework may not be worn into surgery.  DO NOT BRING YOUR HOME MEDICATIONS TO THE HOSPITAL. PHARMACY WILL DISPENSE MEDICATIONS LISTED ON YOUR MEDICATION LIST TO YOU DURING YOUR ADMISSION IN THE HOSPITAL!    Patients discharged on the day of surgery will not be allowed to drive home.  Someone NEEDS to stay with you for the first 24 hours after anesthesia.   Special Instructions: Bring a copy of your healthcare power of attorney and living will documents the  day of surgery if you haven't scanned them before.              Please read over the following fact sheets you were given: IF YOU HAVE QUESTIONS ABOUT YOUR PRE-OP INSTRUCTIONS PLEASE CALL [phone] [place].   If you received a COVID test during your pre-op visit  it is requested that you wear a mask when out in public, stay away from anyone that may not be feeling well and notify your surgeon if you develop symptoms. If you test positive for Covid or have been in contact with anyone that has tested positive in the last 10 days please notify you surgeon.    [place] - Preparing for Surgery Before surgery, you can play an important role.  Because skin is not sterile, your skin needs to be as free of germs as possible.  You can reduce the number of germs on your skin by washing with CHG (chlorahexidine gluconate) soap before surgery.  CHG is an antiseptic cleaner which kills germs and bonds with the skin to continue killing germs even after  washing. Please DO NOT use if you have an allergy to CHG or antibacterial soaps.  If your skin becomes reddened/irritated stop using the CHG and inform your nurse when you arrive at Short Stay. Do not shave (including legs and underarms) for at least 48 hours prior to the first CHG shower.  You may shave your face/neck.  Please follow these instructions carefully:  1.  Shower with CHG Soap the night before surgery ONLY (DO NOT USE THE SOAP THE MORNING OF SURGERY).  2.  If you choose to wash your hair, wash your hair first as usual with your normal  shampoo.  3.  After you shampoo, rinse your hair and body thoroughly to remove the shampoo.                             4.  Use CHG as you would any other liquid soap.  You can apply chg directly to the skin and wash.  Gently with a scrungie or clean washcloth.  5.  Apply the CHG Soap to your body ONLY FROM THE NECK DOWN.   Do   not use on face/ open                           Wound or open sores. Avoid contact with eyes, ears mouth and   genitals (private parts).                       Wash face,  Genitals (private parts) with your normal soap.             6.  Wash thoroughly, paying special attention to the area where your    surgery  will be performed.  7.  Thoroughly rinse your body with warm water from the neck down.  8.  DO NOT shower/wash with your normal soap after using and rinsing off the CHG Soap.                9.  Pat yourself dry with a clean towel.            10.  Wear clean pajamas.            11.  Place clean sheets on your bed the night of your first shower and do not  sleep with pets. Day of Surgery : Do not apply any CHG, lotions/deodorants the morning of surgery.  Please wear clean clothes to the hospital/surgery center.  FAILURE TO FOLLOW THESE INSTRUCTIONS MAY RESULT IN THE CANCELLATION OF YOUR SURGERY  PATIENT SIGNATURE_________________________________  NURSE  SIGNATURE__________________________________  ________________________________________________________________________

## 2024-07-09 ENCOUNTER — Other Ambulatory Visit: Payer: Self-pay

## 2024-07-09 ENCOUNTER — Encounter (HOSPITAL_COMMUNITY): Payer: Self-pay

## 2024-07-09 ENCOUNTER — Encounter (HOSPITAL_COMMUNITY)
Admission: RE | Admit: 2024-07-09 | Discharge: 2024-07-09 | Disposition: A | Discharge status: Home / Self Care | Source: Ambulatory Visit | Attending: Surgery | Admitting: Surgery

## 2024-07-09 VITALS — BP 103/81 | HR 83 | Temp 97.6°F | Resp 12 | Ht 66.0 in | Wt 128.0 lb

## 2024-07-09 DIAGNOSIS — Z955 Presence of coronary angioplasty implant and graft: Secondary | ICD-10-CM | POA: Diagnosis not present

## 2024-07-09 DIAGNOSIS — I10 Essential (primary) hypertension: Secondary | ICD-10-CM

## 2024-07-09 DIAGNOSIS — Z951 Presence of aortocoronary bypass graft: Secondary | ICD-10-CM | POA: Diagnosis not present

## 2024-07-09 DIAGNOSIS — Z7902 Long term (current) use of antithrombotics/antiplatelets: Secondary | ICD-10-CM | POA: Insufficient documentation

## 2024-07-09 DIAGNOSIS — I252 Old myocardial infarction: Secondary | ICD-10-CM | POA: Diagnosis not present

## 2024-07-09 DIAGNOSIS — Z7984 Long term (current) use of oral hypoglycemic drugs: Secondary | ICD-10-CM | POA: Insufficient documentation

## 2024-07-09 DIAGNOSIS — Z01810 Encounter for preprocedural cardiovascular examination: Secondary | ICD-10-CM | POA: Diagnosis present

## 2024-07-09 DIAGNOSIS — I272 Pulmonary hypertension, unspecified: Secondary | ICD-10-CM | POA: Diagnosis not present

## 2024-07-09 DIAGNOSIS — Z95810 Presence of automatic (implantable) cardiac defibrillator: Secondary | ICD-10-CM | POA: Insufficient documentation

## 2024-07-09 DIAGNOSIS — I2510 Atherosclerotic heart disease of native coronary artery without angina pectoris: Secondary | ICD-10-CM | POA: Diagnosis not present

## 2024-07-09 DIAGNOSIS — Z01818 Encounter for other preprocedural examination: Secondary | ICD-10-CM | POA: Insufficient documentation

## 2024-07-09 DIAGNOSIS — I5042 Chronic combined systolic (congestive) and diastolic (congestive) heart failure: Secondary | ICD-10-CM | POA: Diagnosis not present

## 2024-07-09 DIAGNOSIS — K624 Stenosis of anus and rectum: Secondary | ICD-10-CM | POA: Insufficient documentation

## 2024-07-09 DIAGNOSIS — I110 Hypertensive heart disease with heart failure: Secondary | ICD-10-CM | POA: Insufficient documentation

## 2024-07-09 DIAGNOSIS — Z01812 Encounter for preprocedural laboratory examination: Secondary | ICD-10-CM | POA: Diagnosis present

## 2024-07-09 HISTORY — DX: Unspecified osteoarthritis, unspecified site: M19.90

## 2024-07-09 LAB — BASIC METABOLIC PANEL WITH GFR
Anion gap: 9 (ref 5–15)
BUN: 33 mg/dL — ABNORMAL HIGH (ref 8–23)
CO2: 24 mmol/L (ref 22–32)
Calcium: 9.4 mg/dL (ref 8.9–10.3)
Chloride: 104 mmol/L (ref 98–111)
Creatinine, Ser: 1.13 mg/dL — ABNORMAL HIGH (ref 0.44–1.00)
GFR, Estimated: 49 mL/min — ABNORMAL LOW
Glucose, Bld: 96 mg/dL (ref 70–99)
Potassium: 4.7 mmol/L (ref 3.5–5.1)
Sodium: 137 mmol/L (ref 135–145)

## 2024-07-09 LAB — CBC WITH DIFFERENTIAL/PLATELET
Abs Immature Granulocytes: 0.03 K/uL (ref 0.00–0.07)
Basophils Absolute: 0.1 K/uL (ref 0.0–0.1)
Basophils Relative: 1 %
Eosinophils Absolute: 0.3 K/uL (ref 0.0–0.5)
Eosinophils Relative: 3 %
HCT: 44.1 % (ref 36.0–46.0)
Hemoglobin: 14.4 g/dL (ref 12.0–15.0)
Immature Granulocytes: 0 %
Lymphocytes Relative: 10 %
Lymphs Abs: 0.8 K/uL (ref 0.7–4.0)
MCH: 32.4 pg (ref 26.0–34.0)
MCHC: 32.7 g/dL (ref 30.0–36.0)
MCV: 99.3 fL (ref 80.0–100.0)
Monocytes Absolute: 0.6 K/uL (ref 0.1–1.0)
Monocytes Relative: 8 %
Neutro Abs: 6.2 K/uL (ref 1.7–7.7)
Neutrophils Relative %: 78 %
Platelets: 156 K/uL (ref 150–400)
RBC: 4.44 MIL/uL (ref 3.87–5.11)
RDW: 12.8 % (ref 11.5–15.5)
WBC: 7.9 K/uL (ref 4.0–10.5)
nRBC: 0 % (ref 0.0–0.2)

## 2024-07-10 ENCOUNTER — Encounter (HOSPITAL_COMMUNITY): Payer: Self-pay

## 2024-07-10 NOTE — Progress Notes (Signed)
 " Case: [id number] Date/Time: 07/15/24 1421   Procedures:      EXAM UNDER ANESTHESIA, RECTUM - ANORECTAL EXAM UNDER ANESTHESIA WITH POSSIBLE ANAL BIOPSY AND DILATION OF ANAL STRICTURE     BIOPSY, RECTUM   Anesthesia type: Monitor Anesthesia Care   Pre-op diagnosis: ANAL STRICTURE   Location: [facility] ROOM 02 / [facility]   Surgeons: Teresa Lonni HERO, MD       DISCUSSION: Claudia Howell is an 84 yo female with PMH of HTN, history of MI, CAD s/p CABG x 3 (2014) CTO PCI of RCA with Rotational atherectomy and DES x 2 (2020), chronic systolic and diastolic CHF s/p CRT-D placement (2024), A-fib s/p maze procedure (2014), severe pHTN, carotid artery disease, arthritis.  Patient follows with cardiology for history of CAD s/p CABG x 3 in 2014.  Also with history of PAF and underwent maze procedure at the same time of CABG.  S/p PCI to SVG in 2014. Had a NSTEMI in 2020 and underwent CTO PCI of RCA with Rotational atherectomy and DES x 2. Also with history of combined systolic and diastolic CHF. CRT-D placed in 2024. Last cath in 2023 showed stable coronary anatomy since 2020. Also with severe pHTN. Medical therapy recommended. Last echo in March 2025 showed estimated LVEF 33%. Last seen in advanced HF clinic on 04/09/24 by Dr. Cherrie. She reported feeling better after CRT-D was placed. Able to go to exercise classes several times a week. She was advised to continue GDMT. Spironolactone  dose was increased. Advised f/u in 6 months. Cardiac clearance addressed in 1/9 telephone note by NP Monge:  Chart reviewed as part of pre-operative protocol coverage. Given past medical history and time since last visit, based on ACC/AHA guidelines, Claudia Howell is at acceptable risk for the planned procedure without further cardiovascular testing.    Per Dr. Bensimhon, [facility] to proceed (low risk) can hold plavix .   Patient may hold Plavix  for 5 days prior to procedure.  Please resume Plavix  as soon as possible postprocedure, the  discretion of the surgeon.   Device orders:   Device Information:   Clinic EP Physician:  Dr. Eulas Furbish    Device Type:  Defibrillator Manufacturer and Phone #:  [employer]: [phone] Pacemaker Dependent?:  No. Date of Last Device Check:  04/21/2024      Normal Device Function?:  Yes.     Electrophysiologist's Recommendations:   Have magnet available. Provide continuous ECG monitoring when magnet is used or reprogramming is to be performed.  Procedure should not interfere with device function.  No device programming or magnet placement needed.  LD Plavix : 1/15   VS: BP 103/81   Pulse 83   Temp 36.4 C (Oral)   Resp 12   Ht 5' 6 (1.676 m)   Wt 58.1 kg   SpO2 97%   BMI 20.66 kg/m   PROVIDERS: Randol Dawes, MD   LABS: Labs reviewed: Acceptable for surgery. (all labs ordered are listed, but only abnormal results are displayed)  Labs Reviewed  BASIC METABOLIC PANEL WITH GFR - Abnormal; Notable for the following components:      Result Value   BUN 33 (*)    Creatinine, Ser 1.13 (*)    GFR, Estimated 49 (*)    All other components within normal limits  CBC WITH DIFFERENTIAL/PLATELET    EKG 07/13/2023:  Atrial sensed ventricular paced rhythm   Device check 04/21/2024:  Remote Defibrillator interrogation reviewed. Presenting Rhythm:A-sensed V-paced. Battery and lead parameters stable with  stable capture and sensing. Device programming is appropriate.    No clinically significant arrhythmia noted.    Continue remote monitoring.  Echo 08/26/2023:  IMPRESSIONS    1. Global hypokinesis with abnormal septal motion . Left ventricular ejection fraction, by estimation, is 33%. The left ventricle has normal function. The left ventricle demonstrates global hypokinesis. The left ventricular internal cavity size was moderately dilated. Left ventricular diastolic parameters were normal. The average left ventricular global longitudinal strain is -7.5 %. The  global longitudinal strain is abnormal.  2. Device leads in RA/RV. Right ventricular systolic function is normal. The right ventricular size is normal. There is normal pulmonary artery systolic pressure.  3. The mitral valve is abnormal. Trivial mitral valve regurgitation. No evidence of mitral stenosis. Moderate mitral annular calcification.  4. The aortic valve is tricuspid. There is mild calcification of the aortic valve. There is mild thickening of the aortic valve. Aortic valve regurgitation is not visualized. Aortic valve sclerosis is present, with no evidence of aortic valve stenosis.  5. The inferior vena cava is normal in size with greater than 50% respiratory variability, suggesting right atrial pressure of 3 mmHg.  Right/left heart cath 05/01/2022:   CONCLUSIONS: Acute on chronic systolic and diastolic heart failure with EF less than 25%, LVEDP 29 mmHg, and cardiac output 4.3 L/min.  Regional wall motion abnormality identified. Severe pulmonary hypertension with PA mean 47 mmHg, pulmonary vascular resistance 3.49 Wood units, and pulmonary capillary wedge pressure mean 32 mmHg.  V wave to 43 mmHg.  WHO group 2 etiology. Coronary anatomy is unchanged from 2020.  The stented graft to the circumflex is widely patent.  The LIMA to the LAD is widely patent.  The native right coronary is widely patent within the previously stented proximal and mid segment.  Moderate disease is noted distally. RECOMMENDATIONS: Again attempt to institute guideline directed therapy for systolic heart failure.  ACE inhibitor, Tenormin , and HCTZ will be discontinued.  Entresto  24/26 mg twice daily, carvedilol  12.5 mg p.o. twice daily, and Farxiga  will be started.  Kidney function will be assessed in 1 week at which time hopefully an MRA can be added. Hemostasis with minx device. Discussed with the daughter, Claudia Howell. Will need follow-up blood work in 1 week and OV with APP or me in 1 to 2 weeks.   Past  Medical History:  Diagnosis Date   Adenomatous colon polyp 03/2008   Arthritis    CHF (congestive heart failure) (HCC)    Constipation    Coronary artery disease    Last cath 12/14 Cath 12/8 100% SVG occlusion to RCA, 95% SVG.  The stenosis to OM.  LIMA to LAD patent but diffuse disease in LAD after graft.  Overlapping stents mid/distal SVG to OM.   Decubitus ulcer of coccyx 10/22/2012   1/2 inch raw open area on coccyx   Hyperlipidemia    Hypertension    Dr.Henry Claudene   Myocardial infarction [facility])    Osteopenia 02/2008   PAF (paroxysmal atrial fibrillation) (HCC)    during cath/notes 10/16/2012   Right posterior capsular opacification 11/16/2019   S/P CABG x 3 10/22/2012   LIMA to LAD, SVG to distal RCA, SVG to OM, EVH from right thigh   S/P Maze operation for atrial fibrillation 10/22/2012   Left side lesion set using bipolar radiofrequency and cryothermy ablation with clipping of LA appendage   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    Past Surgical History:  Procedure Laterality Date  BIV ICD INSERTION CRT-D N/A 10/22/2022   Procedure: BIV ICD INSERTION CRT-D;  Surgeon: Mealor, Eulas BRAVO, MD;  Location: [facility] INVASIVE CV LAB;  Service: Cardiovascular;  Laterality: N/A;   BREAST CYST EXCISION Left 1980-1990's   x 3   CARDIAC CATHETERIZATION  10/16/2012   CATARACT EXTRACTION, BILATERAL Bilateral    03/2015 and 05/2015   CATARACT EXTRACTION, BILATERAL Bilateral approx 2016   CORONARY ANGIOPLASTY WITH STENT PLACEMENT  04/01/2013   1 (06/01/2013)   CORONARY ARTERY BYPASS GRAFT N/A 10/22/2012   Procedure: CORONARY ARTERY BYPASS GRAFTING (CABG);  Surgeon: Sudie VEAR Laine, MD;  Location: [facility] OR;  Service: Open Heart Surgery;  Laterality: N/A;   CORONARY ATHERECTOMY N/A 04/29/2019   Procedure: CORONARY ATHERECTOMY;  Surgeon: Jordan, Peter M, MD;  Location: [facility] INVASIVE CV LAB;  Service: Cardiovascular;  Laterality: N/A;   CORONARY CTO INTERVENTION N/A 04/29/2019   Procedure:  CORONARY CTO INTERVENTION;  Surgeon: Jordan, Peter M, MD;  Location: [facility] INVASIVE CV LAB;  Service: Cardiovascular;  Laterality: N/A;   CORONARY STENT INTERVENTION N/A 01/01/2019   Procedure: CORONARY STENT INTERVENTION;  Surgeon: [name] ORN, MD;  [facility] INVASIVE CV LAB;  Service: Cardiovascular;  Laterality: N/A;   DILATION AND CURETTAGE OF UTERUS     INTRAOPERATIVE TRANSESOPHAGEAL ECHOCARDIOGRAM N/A 10/22/2012   Procedure: INTRAOPERATIVE TRANSESOPHAGEAL ECHOCARDIOGRAM;  Surgeon: Sudie VEAR Laine, MD;  Location[facility] OR;  Service: Open Heart Surgery;  Laterality: N/A;   LEFT HEART CATH AND CORS/GRAFTS ANGIOGRAPHY N/A 01/01/2019   Procedure: LEFT HEART CATH AND CORS/GRAFTS ANGIOGRAPHY;  Surgeon: [name] ORN, MD;  Location[facility] INVASIVE CV LAB;  Service: Cardiovascular;  Laterality: N/A;   LEFT HEART CATH AND CORS/GRAFTS ANGIOGRAPHY N/A 04/28/2019   Procedure: LEFT HEART CATH AND CORS/GRAFTS ANGIOGRAPHY;  Surgeon: [name] ORN, MD;  [facility] INVASIVE CV LAB;  Service: Cardiovascular;  Laterality: N/A;   LEFT HEART CATHETERIZATION WITH CORONARY ANGIOGRAM N/A 10/16/2012   Procedure: LEFT HEART CATHETERIZATION WITH CORONARY ANGIOGRAM;  Surgeon: Victory ORN Claudene DOUGLAS, MD;  Location: [facility] CATH LAB;  Service: Cardiovascular;  Laterality: N/A;   LEFT HEART CATHETERIZATION WITH CORONARY/GRAFT ANGIOGRAM N/A 06/01/2013   Procedure: LEFT HEART CATHETERIZATION WITH EL BILE;  Surgeon: Victory ORN Claudene DOUGLAS, MD;  Location: [facility] CATH LAB;  Service: Cardiovascular;  Laterality: N/A;   MAZE N/A 10/22/2012   Procedure: MAZE;  Surgeon: Sudie VEAR Laine, MD;  Location: [facility] OR;  Service: Open Heart Surgery;  Laterality: N/A;   PERCUTANEOUS CORONARY STENT INTERVENTION (PCI-S)  06/01/2013   Procedure: PERCUTANEOUS CORONARY STENT INTERVENTION (PCI-S);  Surgeon: Victory ORN Claudene DOUGLAS, MD;  Location: [facility] CATH LAB;  Service: Cardiovascular;;   RIGHT/LEFT HEART CATH AND CORONARY/GRAFT ANGIOGRAPHY N/A 05/01/2022   Procedure:  RIGHT/LEFT HEART CATH AND CORONARY/GRAFT ANGIOGRAPHY;  Surgeon: [name] ORN, MD;  [facility]VAS[facility] CV LAB;  Service: Cardiovascular;  Laterality: N/A;   TONSILLECTOMY  1949   TOTAL ABDOMINAL HYSTERECTOMY W/ BILATERAL SALPINGOOPHORECTOMY  1990   benign growth    MEDICATIONS:  atorvastatin  (LIPITOR) 10 MG tablet   bisacodyl  (DULCOLAX) 5 MG EC tablet   carvedilol  (COREG ) 3.125 MG tablet   cholecalciferol  (VITAMIN D ) 1000 UNITS tablet   clopidogrel  (PLAVIX ) 75 MG tablet   Coenzyme Q10 (COQ10 PO)   FARXIGA  10 MG TABS tablet   furosemide  (LASIX ) 20 MG tablet   MAGNESIUM  PO   Multiple Vitamin (MULTIVITAMIN) tablet   nitroGLYCERIN  (NITROSTAT ) 0.4 MG SL tablet   Plecanatide  (TRULANCE ) 3 MG TABS   polyethylene glycol (  MIRALAX ) 17 g packet   Potassium 99 MG TABS   sacubitril -valsartan  (ENTRESTO ) 24-26 MG   senna (SENOKOT) 8.6 MG tablet   spironolactone  (ALDACTONE ) 25 MG tablet    sodium chloride  flush (NS) 0.9 % injection 3 mL   Claudia Francesconi M Lempi Edwin, PA-C [facility] Surgical Short Stay/Anesthesiology [facility] Phone [phone] 07/10/2024 10:19 AM      "

## 2024-07-10 NOTE — Anesthesia Preprocedure Evaluation (Addendum)
"                                    Anesthesia Evaluation  Patient identified by MRN, date of birth, ID band Patient awake    Reviewed: Allergy & Precautions, H&P , NPO status , Patient's Chart, lab work & pertinent test results  Airway Mallampati: II  TM Distance: >3 FB Neck ROM: Full    Dental  (+) Dental Advisory Given   Pulmonary neg pulmonary ROS   Pulmonary exam normal breath sounds clear to auscultation       Cardiovascular hypertension, Pt. on medications + CAD, + Past MI, + CABG and +CHF  Normal cardiovascular exam Rhythm:Regular Rate:Normal     Neuro/Psych negative neurological ROS  negative psych ROS   GI/Hepatic negative GI ROS, Neg liver ROS,,,  Endo/Other  negative endocrine ROS    Renal/GU negative Renal ROS  negative genitourinary   Musculoskeletal  (+) Arthritis ,    Abdominal   Peds negative pediatric ROS (+)  Hematology negative hematology ROS (+)   Anesthesia Other Findings   Reproductive/Obstetrics negative OB ROS                              Anesthesia Physical Anesthesia Plan  ASA: 3  Anesthesia Plan: MAC   Post-op Pain Management:    Induction: Intravenous  PONV Risk Score and Plan: 2 and Ondansetron , Propofol  infusion and Treatment may vary due to age or medical condition  Airway Management Planned: Simple Face Mask  Additional Equipment:   Intra-op Plan:   Post-operative Plan:   Informed Consent: I have reviewed the patients History and Physical, chart, labs and discussed the procedure including the risks, benefits and alternatives for the proposed anesthesia with the patient or authorized representative who has indicated his/her understanding and acceptance.     Dental advisory given  Plan Discussed with: CRNA  Anesthesia Plan Comments: (See PAT note from 1/15)         Anesthesia Quick Evaluation  "

## 2024-07-14 ENCOUNTER — Encounter (HOSPITAL_COMMUNITY): Payer: Self-pay | Admitting: Surgery

## 2024-07-15 ENCOUNTER — Ambulatory Visit (HOSPITAL_COMMUNITY): Payer: Self-pay | Admitting: Medical

## 2024-07-15 ENCOUNTER — Ambulatory Visit (HOSPITAL_COMMUNITY): Admitting: Anesthesiology

## 2024-07-15 ENCOUNTER — Ambulatory Visit (HOSPITAL_COMMUNITY)
Admission: RE | Admit: 2024-07-15 | Discharge: 2024-07-15 | Disposition: A | Discharge status: Home / Self Care | Source: Ambulatory Visit | Attending: Surgery | Admitting: Surgery

## 2024-07-15 ENCOUNTER — Encounter (HOSPITAL_COMMUNITY): Payer: Self-pay | Admitting: Surgery

## 2024-07-15 ENCOUNTER — Other Ambulatory Visit: Payer: Self-pay

## 2024-07-15 ENCOUNTER — Encounter (HOSPITAL_COMMUNITY)
Admission: RE | Disposition: A | Discharge status: Home / Self Care | Payer: Self-pay | Source: Ambulatory Visit | Attending: Surgery

## 2024-07-15 DIAGNOSIS — I10 Essential (primary) hypertension: Secondary | ICD-10-CM

## 2024-07-15 DIAGNOSIS — K624 Stenosis of anus and rectum: Secondary | ICD-10-CM

## 2024-07-15 DIAGNOSIS — M1990 Unspecified osteoarthritis, unspecified site: Secondary | ICD-10-CM | POA: Diagnosis not present

## 2024-07-15 DIAGNOSIS — I110 Hypertensive heart disease with heart failure: Secondary | ICD-10-CM | POA: Diagnosis not present

## 2024-07-15 DIAGNOSIS — I2510 Atherosclerotic heart disease of native coronary artery without angina pectoris: Secondary | ICD-10-CM

## 2024-07-15 DIAGNOSIS — I252 Old myocardial infarction: Secondary | ICD-10-CM | POA: Diagnosis not present

## 2024-07-15 DIAGNOSIS — I509 Heart failure, unspecified: Secondary | ICD-10-CM

## 2024-07-15 DIAGNOSIS — Z951 Presence of aortocoronary bypass graft: Secondary | ICD-10-CM | POA: Diagnosis not present

## 2024-07-15 HISTORY — PX: RECTAL EXAM UNDER ANESTHESIA: SHX6399

## 2024-07-15 HISTORY — PX: RECTAL BIOPSY: SHX2303

## 2024-07-15 MED ORDER — FENTANYL CITRATE (PF) 100 MCG/2ML IJ SOLN
INTRAMUSCULAR | Status: AC
  Filled 2024-07-15: qty 2

## 2024-07-15 MED ORDER — CHLORHEXIDINE GLUCONATE 0.12 % MT SOLN
15.0000 mL | Freq: Once | OROMUCOSAL | Status: AC
  Administered 2024-07-15: 15 mL via OROMUCOSAL

## 2024-07-15 MED ORDER — ONDANSETRON HCL 4 MG/2ML IJ SOLN
INTRAMUSCULAR | Status: AC
  Filled 2024-07-15: qty 2

## 2024-07-15 MED ORDER — PROPOFOL 500 MG/50ML IV EMUL
INTRAVENOUS | Status: DC | PRN
  Administered 2024-07-15: 120 ug/kg/min via INTRAVENOUS

## 2024-07-15 MED ORDER — LIDOCAINE HCL (PF) 2 % IJ SOLN
INTRAMUSCULAR | Status: AC
  Filled 2024-07-15: qty 5

## 2024-07-15 MED ORDER — AMISULPRIDE (ANTIEMETIC) 5 MG/2ML IV SOLN: 10.0000 mg | Freq: Once | INTRAVENOUS | Status: DC | PRN

## 2024-07-15 MED ORDER — FENTANYL CITRATE (PF) 100 MCG/2ML IJ SOLN
INTRAMUSCULAR | Status: DC | PRN
  Administered 2024-07-15: 50 ug via INTRAVENOUS
  Administered 2024-07-15: 25 ug via INTRAVENOUS

## 2024-07-15 MED ORDER — BUPIVACAINE-EPINEPHRINE (PF) 0.25% -1:200000 IJ SOLN
INTRAMUSCULAR | Status: AC
  Filled 2024-07-15: qty 30

## 2024-07-15 MED ORDER — ORAL CARE MOUTH RINSE: 15.0000 mL | Freq: Once | OROMUCOSAL | Status: AC

## 2024-07-15 MED ORDER — ACETAMINOPHEN 500 MG PO TABS
1000.0000 mg | ORAL_TABLET | ORAL | Status: AC
  Administered 2024-07-15: 1000 mg via ORAL
  Filled 2024-07-15: qty 2

## 2024-07-15 MED ORDER — CHLORHEXIDINE GLUCONATE CLOTH 2 % EX PADS: 6.0000 | MEDICATED_PAD | Freq: Once | CUTANEOUS | Status: DC

## 2024-07-15 MED ORDER — DEXAMETHASONE SOD PHOSPHATE PF 10 MG/ML IJ SOLN
INTRAMUSCULAR | Status: AC
  Filled 2024-07-15: qty 1

## 2024-07-15 MED ORDER — LIDOCAINE HCL (PF) 2 % IJ SOLN
INTRAMUSCULAR | Status: DC | PRN
  Administered 2024-07-15: 40 mg via INTRADERMAL

## 2024-07-15 MED ORDER — HYDROMORPHONE HCL 1 MG/ML IJ SOLN: 0.2500 mg | INTRAMUSCULAR | Status: DC | PRN

## 2024-07-15 MED ORDER — BUPIVACAINE-EPINEPHRINE (PF) 0.25% -1:200000 IJ SOLN
INTRAMUSCULAR | Status: DC | PRN
  Administered 2024-07-15: 30 mL

## 2024-07-15 MED ORDER — LACTATED RINGERS IV SOLN: INTRAVENOUS | Status: DC

## 2024-07-15 MED ORDER — ONDANSETRON HCL 4 MG/2ML IJ SOLN
INTRAMUSCULAR | Status: DC | PRN
  Administered 2024-07-15: 4 mg via INTRAVENOUS

## 2024-07-15 MED ORDER — DEXAMETHASONE SOD PHOSPHATE PF 10 MG/ML IJ SOLN
INTRAMUSCULAR | Status: DC | PRN
  Administered 2024-07-15: 4 mg via INTRAVENOUS

## 2024-07-15 MED ORDER — PROPOFOL 1000 MG/100ML IV EMUL
INTRAVENOUS | Status: AC
  Filled 2024-07-15: qty 100

## 2024-07-15 MED ORDER — PHENYLEPHRINE HCL (PRESSORS) 10 MG/ML IV SOLN
INTRAVENOUS | Status: DC | PRN
  Administered 2024-07-15: 40 ug via INTRAVENOUS
  Administered 2024-07-15 (×2): 80 ug via INTRAVENOUS

## 2024-07-15 MED ORDER — DIBUCAINE (PERIANAL) 1 % EX OINT
TOPICAL_OINTMENT | CUTANEOUS | Status: AC
  Filled 2024-07-15: qty 28

## 2024-07-15 NOTE — H&P (Signed)
 "  CC: Here today for surgery  HPI: Claudia Howell is an 84 y.o. female with history of CAD (on plavix ), CHF, HTN, HLD, IBS/chronic constipation, whom is seen in the office today as a referral by Dr. Charlanne for evaluation of potential anal stenosis.   Colonoscopy 12/06/21 (Dr. Charlanne) -2 small polyps removed - Mild melanosis coli - Moderate sigmoid diverticulosis - Nonbleeding internal hemorrhoids - Examined portion of ileum normal  Reports that she has had a chronic history of constipation for many years in fact much of her adult life. She thinks it has gotten somewhat worse over the last 2 to 3 years. She does report that after her colonoscopy, Dr. Charlanne had instructed her that she did have some narrowing of the anal canal but that after gently applying pressure was able to advance the colonoscope through. She denies any anorectal pain. She denies any evident blood in her stool. She denies any prior anorectal surgeries or procedures including hemorrhoid surgery.  She is currently taking laxatives and her stools are always liquid in consistency with that maneuver.  She denies any changes in health or health history since we met in the office. No new medications/allergies. She states she is ready for surgery today.   PMH: CAD (on plavix ; indwelling defibrillator), CHF, HTN, HLD, IBS/chronic constipation  PSH: Denies any prior anorectal surgeries or procedures  FHx: Denies any known family history of colorectal, breast, endometrial or ovarian cancer  Social Hx: Denies use of tobacco/EtOH/illicit drug. She is here today by herself.   Past Medical History:  Diagnosis Date   Adenomatous colon polyp 03/2008   Arthritis    CHF (congestive heart failure) (HCC)    Constipation    Coronary artery disease    Last cath 12/14 Cath 12/8 100% SVG occlusion to RCA, 95% SVG.  The stenosis to OM.  LIMA to LAD patent but diffuse disease in LAD after graft.  Overlapping stents mid/distal SVG to OM.    Decubitus ulcer of coccyx 10/22/2012   1/2 inch raw open area on coccyx   Hyperlipidemia    Hypertension    Dr.Henry Claudene   Myocardial infarction [facility])    Osteopenia 02/2008   PAF (paroxysmal atrial fibrillation) (HCC)    during cath/notes 10/16/2012   Right posterior capsular opacification 11/16/2019   S/P CABG x 3 10/22/2012   LIMA to LAD, SVG to distal RCA, SVG to OM, EVH from right thigh   S/P Maze operation for atrial fibrillation 10/22/2012   Left side lesion set using bipolar radiofrequency and cryothermy ablation with clipping of LA appendage   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    Past Surgical History:  Procedure Laterality Date   BIV ICD INSERTION CRT-D N/A 10/22/2022   Procedure: BIV ICD INSERTION CRT-D;  Surgeon: Nancey Eulas BRAVO, MD;  Location: [facility] INVASIVE CV LAB;  Service: Cardiovascular;  Laterality: N/A;   BREAST CYST EXCISION Left 1980-1990's   x 3   CARDIAC CATHETERIZATION  10/16/2012   CATARACT EXTRACTION, BILATERAL Bilateral    03/2015 and 05/2015   CATARACT EXTRACTION, BILATERAL Bilateral approx 2016   CORONARY ANGIOPLASTY WITH STENT PLACEMENT  04/01/2013   1 (06/01/2013)   CORONARY ARTERY BYPASS GRAFT N/A 10/22/2012   Procedure: CORONARY ARTERY BYPASS GRAFTING (CABG);  Surgeon: Sudie VEAR Laine, MD;  Location: [facility] OR;  Service: Open Heart Surgery;  Laterality: N/A;   CORONARY ATHERECTOMY N/A 04/29/2019   Procedure: CORONARY ATHERECTOMY;  Surgeon: Jordan, Peter M, MD;  Location: [facility]  INVASIVE CV LAB;  Service: Cardiovascular;  Laterality: N/A;   CORONARY CTO INTERVENTION N/A 04/29/2019   Procedure: CORONARY CTO INTERVENTION;  Surgeon: Jordan, Peter M, MD;  Location: [facility] INVASIVE CV LAB;  Service: Cardiovascular;  Laterality: N/A;   CORONARY STENT INTERVENTION N/A 01/01/2019   Procedure: CORONARY STENT INTERVENTION;  Surgeon: [name] ORN, MD;  [facility] INVASIVE CV LAB;  Service: Cardiovascular;  Laterality: N/A;   DILATION AND CURETTAGE OF UTERUS      INTRAOPERATIVE TRANSESOPHAGEAL ECHOCARDIOGRAM N/A 10/22/2012   Procedure: INTRAOPERATIVE TRANSESOPHAGEAL ECHOCARDIOGRAM;  Surgeon: Sudie VEAR Laine, MD;  Location: [facility] OR;  Service: Open Heart Surgery;  Laterality: N/A;   LEFT HEART CATH AND CORS/GRAFTS ANGIOGRAPHY N/A 01/01/2019   Procedure: LEFT HEART CATH AND CORS/GRAFTS ANGIOGRAPHY;  Surgeon: [name] ORN, MD;  [facility] INVASIVE CV LAB;  Service: Cardiovascular;  Laterality: N/A;   LEFT HEART CATH AND CORS/GRAFTS ANGIOGRAPHY N/A 04/28/2019   Procedure: LEFT HEART CATH AND CORS/GRAFTS ANGIOGRAPHY;  Surgeon: [name] ORN, MD;  [facility] INVASIVE CV LAB;  Service: Cardiovascular;  Laterality: N/A;   LEFT HEART CATHETERIZATION WITH CORONARY ANGIOGRAM N/A 10/16/2012   Procedure: LEFT HEART CATHETERIZATION WITH CORONARY ANGIOGRAM;  Surgeon: Victory ORN Claudene DOUGLAS, MD;  Location: [facility] CATH LAB;  Service: Cardiovascular;  Laterality: N/A;   LEFT HEART CATHETERIZATION WITH CORONARY/GRAFT ANGIOGRAM N/A 06/01/2013   Procedure: LEFT HEART CATHETERIZATION WITH EL BILE;  Surgeon: Victory ORN Claudene DOUGLAS, MD;  Location: [facility] CATH LAB;  Service: Cardiovascular;  Laterality: N/A;   MAZE N/A 10/22/2012   Procedure: MAZE;  Surgeon: Sudie VEAR Laine, MD;  Location: [facility] OR;  Service: Open Heart Surgery;  Laterality: N/A;   PERCUTANEOUS CORONARY STENT INTERVENTION (PCI-S)  06/01/2013   Procedure: PERCUTANEOUS CORONARY STENT INTERVENTION (PCI-S);  Surgeon: Victory ORN Claudene DOUGLAS, MD;  Location: [facility] CATH LAB;  Service: Cardiovascular;;   RIGHT/LEFT HEART CATH AND CORONARY/GRAFT ANGIOGRAPHY N/A 05/01/2022   Procedure: RIGHT/LEFT HEART CATH AND CORONARY/GRAFT ANGIOGRAPHY;  Surgeon: [name] ORN, MD;  [facility] INVASIVE CV LAB;  Service: Cardiovascular;  Laterality: N/A;   TONSILLECTOMY  1949   TOTAL ABDOMINAL HYSTERECTOMY W/ BILATERAL SALPINGOOPHORECTOMY  1990   benign growth    Family History  Problem Relation Age of Onset   Hypertension Mother    Heart attack  Mother 44   Hypertension Brother    Cancer Brother        prostate (76), gall bladder (dx 94)   Heart disease Brother 67       stent   Prostate cancer Brother    Hypertension Son    Heart disease Other        x2 (age 88 and 72)   Diabetes Neg Hx    Breast cancer Neg Hx    Colon cancer Neg Hx    Stomach cancer Neg Hx    Rectal cancer Neg Hx    Liver cancer Neg Hx    Pancreatic cancer Neg Hx    Esophageal cancer Neg Hx     Social:  reports that she has never smoked. She has never used smokeless tobacco. She reports that she does not currently use alcohol. She reports that she does not use drugs.  Allergies: Allergies[1]  Medications: I have reviewed the patient's current medications.  No results found for this or any previous visit (from the past 48 hours).  No results found.   PE Blood pressure (!) 172/83, pulse 90, temperature 97.6 F (36.4 C), temperature source Oral, resp. rate  16, height 5' 6 (1.676 m), weight 58.1 kg, SpO2 98%. Constitutional: NAD; conversant Eyes: Moist conjunctiva; no lid lag; anicteric Lungs: Normal respiratory effort CV: RRR Psychiatric: Appropriate affect  No results found for this or any previous visit (from the past 48 hours).  No results found.  A/P: Claudia Howell is an 84 y.o. female with hx of CAD (on plavix ), CHF, HTN, HLD, IBS/chronic constipation, here for evaluation of anal stricture  - We obtained cardiac clearance from her cardiologist, Dr. Cherrie  - The anatomy and physiology of the anal canal was discussed with her with associated pictures (publisher Krames, hemorrhoid handbook). The pathophysiology of anal stricture was discussed at length with associated pictures and illustrations. - We have reviewed options going forward including further observation vs surgery - anorectal exam under anesthesia with dilation and possible biopsy of anal stricture.  - The planned procedure, material risks (including, but not limited to,  pain, bleeding, infection, scarring, need for blood transfusion, damage to anal sphincter, incontinence of gas and/or stool, need for additional procedures, recurrent anal stenosis, rare cases of pelvic sepsis which in severe cases may require things like a colostomy, recurrence, blood clot, pulmonary embolus, pneumonia, heart attack, stroke, death) benefits and alternatives to surgery were discussed at length. The patient's questions were answered to her satisfaction, she voiced understanding and elected to proceed with surgery. Additionally, we discussed typical postoperative expectations and the recovery process.  -We also spent time discussing that if she does have in fact a relatively short tract intrinsic benign stricture that she may be recommended self dilation at home. We spent time today discussing what that would involve including obtaining anal dilators which may be purchased on [employer]. We discussed that they would likely be approximately the size of her index finger and that these would be inserted 1 to 2 inches into the anal canal. We discussed the frequency of this being daily to every other day. She expressed understanding of this.   Lonni Pizza, MD [facility]     [1]  Allergies Allergen Reactions   Contrast Media [Iodinated Contrast Media] Hives   Pravastatin Other (See Comments)    muscle aches   Simvastatin Other (See Comments)    Muscle cramps   Tramadol  Hcl Nausea And Vomiting   Zetia  [Ezetimibe ] Other (See Comments)    Muscle cramps   Oxycodone  Nausea And Vomiting and Other (See Comments)    Extreme nausea and vomiting   Repatha  [Evolocumab ] Other (See Comments)    Muscle cramps   "

## 2024-07-15 NOTE — Op Note (Signed)
 07/15/2024  2:23 PM  PATIENT:  Claudia Howell  84 y.o. female  Patient Care Team: Randol Dawes, MD as PCP - Howell (Family Medicine) [name] ORN, MD (Inactive) as PCP - Cardiology (Cardiology) Mealor, Eulas BRAVO, MD as PCP - Electrophysiology (Cardiology) Lita Lye, OD as Referring Physician (Optometry) Elner, Arley DELENA, MD as Consulting Physician (Ophthalmology) Joshua Blamer, MD as Consulting Physician (Dermatology) Magdalen Pasco RAMAN, DPM as Consulting Physician (Podiatry) Dr. Curtistine Prader, DDS as Consulting Physician (Dentistry) Charlanne Groom, MD as Consulting Physician (Gastroenterology)  PRE-OPERATIVE DIAGNOSIS:  Anal stricture  POST-OPERATIVE DIAGNOSIS:  Same  PROCEDURE:   Dilation of anal stricture using hegar dilators Anorectal exam under anesthesia with circumferential anoscopy  SURGEON:  Lonni HERO. Courteny Egler, MD  ANESTHESIA:   local and MAC  COUNTS:  Sponge, needle and instrument counts were reported correct x2 at the conclusion of the operation.  EBL: 1 mL  DRAINS: none  SPECIMEN: None  COMPLICATIONS: None  FINDINGS: Thin stricture of the distal anal canal slightly tighter than 1 fingerbreadth. Squentially dilated with Hegar dilators to 45 Fr without difficulty. Digital examinantion then possible and demonstrates thin smooth stricture. Circumferential anoscopy demonstrates healthy appearing anoderm without evident mass norm abnormal appearing anoderm of distal rectum.  DISPOSITION: PACU in satisfactory condition  DESCRIPTION:  The patient was seen in the pre-op holding area. The risks, benefits, complications, treatment options, and expected outcomes were previously discussed with the patient. The patient agreed with the proposed plan and has signed the informed consent form. The patient was brought to the operating room by the surgical team, identified as Claudia Howell, and the procedure verified. placed supine on the operating table and SCD's were applied.  She was positioned in high lithotomy with Allen stirrups and subsequently MAC anesthesia induced without difficulty. Pressure points were evaluated and padded. She was secured to the operating table. The perineum and perianal region was prepped and draped in the standard sterile fashion. A time out was completed and the above information confirmed.  Externally, the perianal skin is normal.  There is evident smooth of stricturing of the anal os.  Local anesthetic is then infiltrated in a perianal block type manner using 0.25% Marcaine  with epinephrine .  After ensuring adequate local anesthesia, we began with sequentially larger Hegar dilators starting with the smallest on the tray and working up to 46 French. The 46 Fr was the first dilator we used which experienced mild resistance.  This did however pass without significant difficulty or apparent fissuring.  We are able to insert the smallest anoscope into the anal canal at this point and evaluate the anal canal.  Circumferential anoscopy demonstrates healthy appearing anoderm.  There is no evident abnormal type tissue visible within the anal canal or distal rectum.  There is no mass at this location either.  The stricture was quite smooth.  Lubricated digital evaluation is also able to be performed at this point which demonstrates no other notable abnormalities.  Topical dibucaine ointment is then applied as there is some very shallow fissuring evident.  All sponge, needle, and instrument counts were reported correct.  She is subsequently taken out of the lithotomy position and a dressing consisting of 4 x 4's, ABD, and mesh underwear was placed.  She was awakened from anesthesia, and transferred to a stretcher for transport to recovery in satisfactory condition.

## 2024-07-15 NOTE — Transfer of Care (Signed)
 Immediate Anesthesia Transfer of Care Note  Patient: Claudia Howell  Procedure(s) Performed: EXAM UNDER ANESTHESIA, RECTUM BIOPSY, RECTUM  Patient Location: PACU  Anesthesia Type:MAC  Level of Consciousness: awake, alert , oriented, and patient cooperative  Airway & Oxygen Therapy: Patient Spontanous Breathing and Patient connected to face mask oxygen  Post-op Assessment: Report given to RN and Post -op Vital signs reviewed and stable  Post vital signs: Reviewed and stable  Last Vitals:  Vitals Value Taken Time  BP    Temp    Pulse 75 07/15/24 14:45  Resp 16 07/15/24 14:45  SpO2 100 % 07/15/24 14:45  Vitals shown include unfiled device data.  Last Pain:  Vitals:   07/15/24 1158  TempSrc: Oral  PainSc: 0-No pain         Complications: No notable events documented.

## 2024-07-15 NOTE — Discharge Instructions (Addendum)
 ANORECTAL SURGERY: POST OP INSTRUCTIONS  DIET: Follow a light bland diet the first 24 hours after arrival home, such as soup, liquids, crackers, etc.  Be sure to include lots of fluids daily.  Avoid fast food or heavy meals as your are more likely to get nauseated.  Eat a low fat diet the next few days after surgery.   Some bleeding with bowel movements is expected for the first couple of days but this should stop in between bowel movements Consider purchasing a 46 Fr Hegar dilator to help maintain anal opening patency for the next 6 months. These can be purchased online and inserted with a small amount of water soluble lubricant. They only need to be inserted approximately 1 inch to achieve desired effect. Stop if you experience any discomfort. This can be done once daily.  Take your usually prescribed home medications unless otherwise directed.  PAIN CONTROL: It is helpful to take an over-the-counter pain medication regularly for the first few days/weeks.  Choose from the following that works best for you: Ibuprofen (Advil, etc) Three 200mg  tabs every 6 hours as needed. Acetaminophen  (Tylenol , etc) 500-650mg  every 6 hours as needed NOTE: You may take both of these medications together - most patients find it most helpful when alternating between the two (i.e. Ibuprofen at 6am, tylenol  at 9am, ibuprofen at 12pm ..SABRA)  Avoid getting constipated.  Between the surgery and the pain medications, it is common to experience some constipation.  Increasing fluid intake (64oz of water per day) and taking a fiber supplement (such as Metamucil, Citrucel, FiberCon) 1-2 times a day regularly will usually help prevent this problem from occurring.  Take Miralax  (over the counter) 1-2x/day while taking a narcotic pain medication. If no bowel movement after 48hours, you may additionally take a laxative like a bottle of Milk of Magnesia which can be purchased over the counter. Avoid enemas.   Watch out for diarrhea.   If you have many loose bowel movements, simplify your diet to bland foods.  Stop any stool softeners and decrease your fiber supplement. If this worsens or does not improve, please call us .  Wash / shower every day.  If you were discharged with a dressing, you may remove this the day after your surgery. You may shower normally, getting soap/water on your wound, particularly after bowel movements.  Soaking in a warm bath filled a couple inches (Sitz bath) is a great way to clean the area after a bowel movement and many patients find it is a way to soothe the area.  ACTIVITIES as tolerated:   You may resume regular (light) daily activities beginning the next day--such as daily self-care, walking, climbing stairs--gradually increasing activities as tolerated.  If you can walk 30 minutes without difficulty, it is safe to try more intense activity such as jogging, treadmill, bicycling, low-impact aerobics, etc. Refrain from any heavy lifting or straining for the first 2 weeks after your procedure, particularly if your surgery was for hemorrhoids. Avoid activities that make your pain worse You may drive when you are no longer taking prescription pain medication, you can comfortably wear a seatbelt, and you can safely maneuver your car and apply brakes.  FOLLOW UP in our office Please call [facility] at [phone] to set up an appointment to see your surgeon in the office for a follow-up appointment approximately 2 weeks after your surgery. Make sure that you call for this appointment the day you arrive home to insure a convenient appointment time.  9. If you have disability or family leave forms that need to be completed, you may have them completed by your primary care physician's office; for return to work instructions, please ask our office staff and they will be happy to assist you in obtaining this documentation   When to call us  [phone]: Poor pain control Reactions / problems with new  medications (rash/itching, etc)  Fever over 101.5 F (38.5 C) Inability to urinate Nausea/vomiting Worsening swelling or bruising Continued bleeding from incision. Increased pain, redness, or drainage from the incision  The clinic staff is available to answer your questions during regular business hours (8:30am-5pm).  Please dont hesitate to call and ask to speak to one of our nurses for clinical concerns.   A surgeon from [facility] is always on call at the hospitals   If you have a medical emergency, go to the nearest emergency room or call 911.   [facility] [address], [address], [place], [place]  [address] MAIN: [phone] FAX: [phone] [email or web address]

## 2024-07-16 ENCOUNTER — Encounter (HOSPITAL_COMMUNITY): Payer: Self-pay | Admitting: Surgery

## 2024-07-16 NOTE — Anesthesia Postprocedure Evaluation (Signed)
"   Anesthesia Post Note  Patient: Claudia Howell  Procedure(s) Performed: EXAM UNDER ANESTHESIA, RECTUM BIOPSY, RECTUM     Patient location during evaluation: PACU Anesthesia Type: MAC Level of consciousness: awake and alert Pain management: pain level controlled Vital Signs Assessment: post-procedure vital signs reviewed and stable Respiratory status: spontaneous breathing, nonlabored ventilation and respiratory function stable Cardiovascular status: blood pressure returned to baseline and stable Postop Assessment: no apparent nausea or vomiting Anesthetic complications: no   No notable events documented.  Last Vitals:  Vitals:   07/15/24 1515 07/15/24 1530  BP: (!) 158/84 125/67  Pulse: 71 67  Resp: 13 13  Temp:  36.4 C  SpO2: 99% 98%    Last Pain:  Vitals:   07/15/24 1530  TempSrc:   PainSc: 0-No pain   Pain Goal:                   Butler Levander Pinal      "

## 2024-07-20 ENCOUNTER — Telehealth (HOSPITAL_COMMUNITY): Payer: Self-pay

## 2024-07-20 NOTE — Telephone Encounter (Signed)
 Advanced Heart Failure Patient Advocate Encounter  The patient was renewed for a [employer] grant that will help cover the cost of Carvedilol , Entresto , Farxiga , Spironolactone .  Total amount awarded, $7,500.  Effective: 06/20/2024 - 06/19/2025.  BIN [id number] PCN [id number] [id number] ID [id number]  Pharmacy provided with approval and processing information. Patient informed via MyChart.  Rachel DEL, CPhT Rx Patient Advocate Phone: [phone]

## 2024-07-21 ENCOUNTER — Ambulatory Visit: Payer: Self-pay | Admitting: Cardiovascular Disease

## 2024-07-21 DIAGNOSIS — I447 Left bundle-branch block, unspecified: Secondary | ICD-10-CM

## 2024-07-21 LAB — CUP PACEART REMOTE DEVICE CHECK
Battery Remaining Longevity: 122 mo
Battery Voltage: 3.01 V
Brady Statistic RV Percent Paced: 0 %
Date Time Interrogation Session: 20260126194659
HighPow Impedance: 72 Ohm
Implantable Lead Connection Status: 753985
Implantable Lead Connection Status: 753985
Implantable Lead Connection Status: 753985
Implantable Lead Implant Date: 20240429
Implantable Lead Implant Date: 20240429
Implantable Lead Implant Date: 20240429
Implantable Lead Location: 753859
Implantable Lead Location: 753860
Implantable Lead Location: 753860
Implantable Lead Model: 3830
Implantable Lead Model: 5076
Implantable Pulse Generator Implant Date: 20240429
Lead Channel Impedance Value: 266 Ohm
Lead Channel Impedance Value: 418 Ohm
Lead Channel Impedance Value: 437 Ohm
Lead Channel Impedance Value: 456 Ohm
Lead Channel Impedance Value: 589 Ohm
Lead Channel Impedance Value: 646 Ohm
Lead Channel Pacing Threshold Amplitude: 0.5 V
Lead Channel Pacing Threshold Pulse Width: 0.4 ms
Lead Channel Sensing Intrinsic Amplitude: 1.6 mV
Lead Channel Sensing Intrinsic Amplitude: 11 mV
Lead Channel Setting Pacing Amplitude: 1 V
Lead Channel Setting Pacing Amplitude: 2.5 V
Lead Channel Setting Pacing Pulse Width: 0.4 ms
Lead Channel Setting Sensing Sensitivity: 0.3 mV
Zone Setting Status: 755011
Zone Setting Status: 755011
Zone Setting Status: 755011

## 2024-07-27 ENCOUNTER — Telehealth: Payer: Self-pay
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
## 2024-07-29 NOTE — Progress Notes (Signed)
 Remote ICD Transmission

## 2024-07-30 ENCOUNTER — Other Ambulatory Visit

## 2024-08-27 ENCOUNTER — Other Ambulatory Visit

## 2024-09-02 ENCOUNTER — Ambulatory Visit: Admitting: Cardiovascular Disease

## 2025-06-02 ENCOUNTER — Encounter: Admitting: Family Medicine
